# Patient Record
Sex: Female | Born: 1935 | Race: Black or African American | Hispanic: No | Smoking: Former smoker
Health system: Southern US, Community
[De-identification: ages and names within clinical notes are randomized; demographics above are authoritative.]

## PROBLEM LIST (undated history)

## (undated) DIAGNOSIS — N182 Chronic kidney disease, stage 2 (mild): Secondary | ICD-10-CM

## (undated) DIAGNOSIS — D696 Thrombocytopenia, unspecified: Secondary | ICD-10-CM

## (undated) DIAGNOSIS — E1129 Type 2 diabetes mellitus with other diabetic kidney complication: Secondary | ICD-10-CM

## (undated) DIAGNOSIS — H269 Unspecified cataract: Secondary | ICD-10-CM

## (undated) DIAGNOSIS — R21 Rash and other nonspecific skin eruption: Secondary | ICD-10-CM

## (undated) DIAGNOSIS — R351 Nocturia: Secondary | ICD-10-CM

## (undated) DIAGNOSIS — I739 Peripheral vascular disease, unspecified: Secondary | ICD-10-CM

## (undated) DIAGNOSIS — R413 Other amnesia: Secondary | ICD-10-CM

## (undated) DIAGNOSIS — R350 Frequency of micturition: Secondary | ICD-10-CM

## (undated) DIAGNOSIS — D649 Anemia, unspecified: Secondary | ICD-10-CM

## (undated) DIAGNOSIS — M25569 Pain in unspecified knee: Secondary | ICD-10-CM

## (undated) DIAGNOSIS — E785 Hyperlipidemia, unspecified: Secondary | ICD-10-CM

## (undated) DIAGNOSIS — E669 Obesity, unspecified: Secondary | ICD-10-CM

## (undated) DIAGNOSIS — I10 Essential (primary) hypertension: Secondary | ICD-10-CM

## (undated) DIAGNOSIS — G4700 Insomnia, unspecified: Secondary | ICD-10-CM

## (undated) DIAGNOSIS — L2089 Other atopic dermatitis: Secondary | ICD-10-CM

## (undated) DIAGNOSIS — Z0000 Encounter for general adult medical examination without abnormal findings: Secondary | ICD-10-CM

## (undated) DIAGNOSIS — F0390 Unspecified dementia without behavioral disturbance: Secondary | ICD-10-CM

## (undated) DIAGNOSIS — I509 Heart failure, unspecified: Secondary | ICD-10-CM

## (undated) DIAGNOSIS — E559 Vitamin D deficiency, unspecified: Secondary | ICD-10-CM

## (undated) DIAGNOSIS — R627 Adult failure to thrive: Secondary | ICD-10-CM

## (undated) DIAGNOSIS — F411 Generalized anxiety disorder: Secondary | ICD-10-CM

## (undated) HISTORY — DX: Anemia, unspecified: D64.9

## (undated) HISTORY — DX: Other amnesia: R41.3

## (undated) HISTORY — DX: Peripheral vascular disease, unspecified: I73.9

## (undated) HISTORY — DX: Essential (primary) hypertension: I10

## (undated) HISTORY — DX: Chronic kidney disease, stage 2 (mild): N18.2

## (undated) HISTORY — DX: Generalized anxiety disorder: F41.1

## (undated) HISTORY — DX: Frequency of micturition: R35.0

## (undated) HISTORY — DX: Hyperlipidemia, unspecified: E78.5

## (undated) HISTORY — DX: Nocturia: R35.1

## (undated) HISTORY — DX: Obesity, unspecified: E66.9

## (undated) HISTORY — DX: Rash and other nonspecific skin eruption: R21

## (undated) HISTORY — DX: Encounter for general adult medical examination without abnormal findings: Z00.00

## (undated) HISTORY — DX: Other atopic dermatitis: L20.89

## (undated) HISTORY — DX: Insomnia, unspecified: G47.00

## (undated) HISTORY — DX: Unspecified cataract: H26.9

## (undated) HISTORY — DX: Adult failure to thrive: R62.7

## (undated) HISTORY — DX: Vitamin D deficiency, unspecified: E55.9

## (undated) HISTORY — DX: Type 2 diabetes mellitus with other diabetic kidney complication: E11.29

## (undated) HISTORY — DX: Pain in unspecified knee: M25.569

---

## 1968-08-09 HISTORY — PX: ABDOMINAL HYSTERECTOMY: SHX81

## 1977-08-09 HISTORY — PX: BREAST SURGERY: SHX581

## 1999-09-24 LAB — HM COLONOSCOPY: HM Colonoscopy: NORMAL

## 2002-08-09 HISTORY — PX: EYE SURGERY: SHX253

## 2009-07-07 ENCOUNTER — Ambulatory Visit (HOSPITAL_COMMUNITY): Admission: RE | Admit: 2009-07-07 | Discharge: 2009-07-07 | Payer: Self-pay | Admitting: Internal Medicine

## 2009-07-11 ENCOUNTER — Ambulatory Visit (HOSPITAL_COMMUNITY): Admission: RE | Admit: 2009-07-11 | Discharge: 2009-07-11 | Payer: Self-pay | Admitting: Internal Medicine

## 2012-10-31 ENCOUNTER — Other Ambulatory Visit: Payer: Self-pay | Admitting: *Deleted

## 2012-10-31 DIAGNOSIS — I10 Essential (primary) hypertension: Secondary | ICD-10-CM

## 2012-10-31 DIAGNOSIS — E785 Hyperlipidemia, unspecified: Secondary | ICD-10-CM

## 2012-10-31 DIAGNOSIS — E1129 Type 2 diabetes mellitus with other diabetic kidney complication: Secondary | ICD-10-CM

## 2012-11-23 ENCOUNTER — Encounter: Payer: Self-pay | Admitting: *Deleted

## 2012-11-23 ENCOUNTER — Other Ambulatory Visit: Payer: Medicaid Other

## 2012-11-23 DIAGNOSIS — E785 Hyperlipidemia, unspecified: Secondary | ICD-10-CM

## 2012-11-23 DIAGNOSIS — I10 Essential (primary) hypertension: Secondary | ICD-10-CM

## 2012-11-23 DIAGNOSIS — E1129 Type 2 diabetes mellitus with other diabetic kidney complication: Secondary | ICD-10-CM

## 2012-11-24 ENCOUNTER — Encounter: Payer: Self-pay | Admitting: *Deleted

## 2012-11-24 LAB — MICROALBUMIN / CREATININE URINE RATIO
Creatinine, Ur: 245.4 mg/dL (ref 15.0–278.0)
MICROALB/CREAT RATIO: 4 mg/g creat (ref 0.0–30.0)
Microalbumin, Urine: 9.9 ug/mL (ref 0.0–17.0)

## 2012-11-24 LAB — COMPREHENSIVE METABOLIC PANEL
ALT: 5 IU/L (ref 0–32)
AST: 13 IU/L (ref 0–40)
Albumin/Globulin Ratio: 1.3 (ref 1.1–2.5)
Albumin: 4.1 g/dL (ref 3.5–4.7)
Alkaline Phosphatase: 76 IU/L (ref 39–117)
BUN/Creatinine Ratio: 13 (ref 11–26)
BUN: 35 mg/dL — ABNORMAL HIGH (ref 8–27)
CO2: 23 mmol/L (ref 19–28)
Calcium: 9.3 mg/dL (ref 8.6–10.2)
Chloride: 103 mmol/L (ref 97–108)
Creatinine, Ser: 2.68 mg/dL — ABNORMAL HIGH (ref 0.57–1.00)
GFR calc Af Amer: 19 mL/min/{1.73_m2} — ABNORMAL LOW (ref >59)
GFR calc non Af Amer: 16 mL/min/{1.73_m2} — ABNORMAL LOW (ref >59)
Globulin, Total: 3.1 g/dL (ref 1.5–4.5)
Glucose: 120 mg/dL — ABNORMAL HIGH (ref 65–99)
Potassium: 4.2 mmol/L (ref 3.5–5.2)
Sodium: 140 mmol/L (ref 134–144)
Total Bilirubin: 0.2 mg/dL (ref 0.0–1.2)
Total Protein: 7.2 g/dL (ref 6.0–8.5)

## 2012-11-24 LAB — LIPID PANEL
Chol/HDL Ratio: 3.5 ratio units (ref 0.0–4.4)
Cholesterol, Total: 179 mg/dL (ref 100–199)
HDL: 51 mg/dL (ref >39)
LDL Calculated: 111 mg/dL — ABNORMAL HIGH (ref 0–99)
Triglycerides: 87 mg/dL (ref 0–149)
VLDL Cholesterol Cal: 17 mg/dL (ref 5–40)

## 2012-11-24 LAB — HEMOGLOBIN A1C
Est. average glucose Bld gHb Est-mCnc: 140 mg/dL
Hgb A1c MFr Bld: 6.5 % — ABNORMAL HIGH (ref 4.8–5.6)

## 2012-11-27 ENCOUNTER — Encounter: Payer: Self-pay | Admitting: Pharmacotherapy

## 2012-11-27 ENCOUNTER — Ambulatory Visit (INDEPENDENT_AMBULATORY_CARE_PROVIDER_SITE_OTHER): Payer: Medicare Other | Admitting: Pharmacotherapy

## 2012-11-27 VITALS — BP 122/58 | HR 60 | Temp 96.5°F | Resp 18 | Wt 172.2 lb

## 2012-11-27 DIAGNOSIS — E1129 Type 2 diabetes mellitus with other diabetic kidney complication: Secondary | ICD-10-CM

## 2012-11-27 DIAGNOSIS — IMO0002 Reserved for concepts with insufficient information to code with codable children: Secondary | ICD-10-CM | POA: Insufficient documentation

## 2012-11-27 DIAGNOSIS — E785 Hyperlipidemia, unspecified: Secondary | ICD-10-CM

## 2012-11-27 DIAGNOSIS — I10 Essential (primary) hypertension: Secondary | ICD-10-CM

## 2012-11-27 MED ORDER — BAYER MICROLET LANCETS MISC: Status: DC

## 2012-11-27 MED ORDER — BAYER CONTOUR NEXT EZ W/DEVICE KIT: 1.0000 | PACK | Freq: Every day | Status: DC

## 2012-11-27 MED ORDER — GLUCOSE BLOOD VI STRP: ORAL_STRIP | Status: DC

## 2012-11-27 NOTE — Progress Notes (Signed)
  Subjective:    Natalie Wolfe is a 79 y.o. female who presents for follow-up of Type 2 diabetes mellitus.   A1C is 6.5% Average BG:  111mg /dl No hypoglycemia.  Lowest BG:  78mg /dl Has been making healthy food choices. She walks the steps for exercise every day. Denies problems with feet. Some watery eyes.  Eye exam is due and scheduled. Nocturia 2-3 times per night. Some polydipsia.  Review of Systems A comprehensive review of systems was negative except for: Eyes: positive for watery eyes Genitourinary: positive for nocturia Endocrine: positive for diabetic symptoms including polydipsia    Objective:    BP 122/58  Pulse 60  Temp(Src) 96.5 F (35.8 C)  Resp 18  Wt 172 lb 3.2 oz (78.109 kg)  General:  alert, cooperative, appears stated age and no distress  Oropharynx: normal findings: lips normal without lesions and gums healthy   Eyes:  negative findings: lids and lashes normal, conjunctivae and sclerae normal, corneas clear and pupils equal, round, reactive to light and accomodation   Ears:   external ears are normal        Lung: clear to auscultation bilaterally  Heart:  regular rate and rhythm     Extremities: extremities normal, atraumatic, no cyanosis or edema  Skin:  warm and dry     Neuro: mental status, speech normal, alert and oriented x3 and gait and station normal   Lab Review Glucose (mg/dL)  Date Value  8/46/9629 120*     CO2 (mmol/L)  Date Value  11/23/2012 23      BUN (mg/dL)  Date Value  01/04/4131 35*     Creatinine, Ser (mg/dL)  Date Value  4/40/1027 2.68*    Microalbumin:  9.9 AST:  13 ALT:  5 Total cholesterol:  179 LDL:  111 HDL:  51 TG:  87 Assessment:    Diabetes Mellitus type II, under excellent control.   BP goal <140/80 LDL goal <100   Plan:    1.  Rx changes: none 2.  DM well controlled on Actos and Amaryl.  Not a candidate for metformin due to SCr.  A1C is excellent at 6.5%. 3.  Reviewed nutrition  goals. 4.  Counseled on exercise goals - 30-45 minutes 5 x week. 5.  Reviewed foot care. 6.  BP well controlled.  Continue current medication. 7.  LDL close to goal of <100.  Will continue current RX and monitor.

## 2013-01-10 ENCOUNTER — Encounter: Payer: Self-pay | Admitting: Internal Medicine

## 2013-01-10 ENCOUNTER — Ambulatory Visit (INDEPENDENT_AMBULATORY_CARE_PROVIDER_SITE_OTHER): Payer: Medicare Other | Admitting: Internal Medicine

## 2013-01-10 VITALS — BP 142/74 | HR 65 | Temp 98.0°F | Resp 18 | Ht 63.0 in | Wt 172.0 lb

## 2013-01-10 DIAGNOSIS — I10 Essential (primary) hypertension: Secondary | ICD-10-CM

## 2013-01-10 DIAGNOSIS — E1122 Type 2 diabetes mellitus with diabetic chronic kidney disease: Secondary | ICD-10-CM

## 2013-01-10 DIAGNOSIS — E1129 Type 2 diabetes mellitus with other diabetic kidney complication: Secondary | ICD-10-CM

## 2013-01-10 DIAGNOSIS — N3281 Overactive bladder: Secondary | ICD-10-CM

## 2013-01-10 DIAGNOSIS — N182 Chronic kidney disease, stage 2 (mild): Secondary | ICD-10-CM

## 2013-01-10 DIAGNOSIS — D638 Anemia in other chronic diseases classified elsewhere: Secondary | ICD-10-CM

## 2013-01-10 DIAGNOSIS — N318 Other neuromuscular dysfunction of bladder: Secondary | ICD-10-CM

## 2013-01-10 MED ORDER — PIOGLITAZONE HCL 15 MG PO TABS: 15.0000 mg | ORAL_TABLET | Freq: Every day | ORAL | Status: DC

## 2013-01-10 NOTE — Patient Instructions (Signed)
Stop taking the prednisone

## 2013-01-10 NOTE — Progress Notes (Signed)
Subjective:    Patient ID: Natalie Wolfe, female    DOB: 28-Dec-1929, 79 y.o.   MRN: [id number]  HPI 79 y/o female patient is here for routine follow up She saw her kidney doctor today and had to rush to see [place]. bp under control at home cbg at home 78- 100 Her bladder symptoms have been under better control with vesicare. No side effects reported Reviewed her medications. compliant with her emdications Has no new concerns this visit  Of note- patient is on prednsione 20 mg daily for unclear reason in her medication list. Pt does not remember clearly if she is still taking this medication.   Review of Systems  Constitutional: Negative for fever, chills, activity change and appetite change.  HENT: Negative for congestion and mouth sores.        Dentures present  Respiratory: Negative for chest tightness and shortness of breath.   Cardiovascular: Negative for chest pain and leg swelling.  Gastrointestinal: Negative for abdominal pain and constipation.  Genitourinary: Positive for urgency and frequency. Negative for dysuria and flank pain.  Neurological: Negative for dizziness, syncope and light-headedness.  Hematological: Negative for adenopathy.  Psychiatric/Behavioral: Negative for behavioral problems and agitation.      Objective:   Physical Exam  Constitutional: She is oriented to person, place, and time. No distress.  Obese, elderly female  HENT:  Head: Normocephalic and atraumatic.  Mouth/Throat: Oropharynx is clear and moist. No oropharyngeal exudate.  Upper and lower dentures  Eyes: Pupils are equal, round, and reactive to light.  Neck: Normal range of motion. Neck supple. No tracheal deviation present. No thyromegaly present.  Cardiovascular: Normal rate, regular rhythm and normal heart sounds.   No murmur heard. Pulmonary/Chest: Effort normal and breath sounds normal. No respiratory distress.  Abdominal: Soft. Bowel sounds are normal. She exhibits no distension.  There is no tenderness.  Musculoskeletal: Normal range of motion. She exhibits no edema.  Lymphadenopathy:    She has no cervical adenopathy.  Neurological: She is alert and oriented to person, place, and time.  Skin: Skin is warm and dry. No rash noted. She is not diaphoretic.  Psychiatric: She has a normal mood and affect. Her behavior is normal.    BP 142/74  Pulse 65  Temp(Src) 98 F (36.7 C) (Oral)  Resp 18  Ht 5\' 3"  (1.6 m)  Wt 172 lb (78.019 kg)  BMI 30.48 kg/m2  SpO2 99%      Assessment & Plan:   HTN- SBP mildly elevated. No symptoms present. Continue amlodipine-benazepril, hctz with coreg  DM TYPE 2 with renal complications- home cbg reviewed. Continue glimepiride with actos. On ACEI and statin. Has microalbuminuria  Anemia of chronic disease- has hisotry of iron deficiency anemia and chronic kidney disease would be contributing to the anemia as well. Currently h/h stable. Monitor cbc periodically  CKD- in setting of htn and DM, follows with renal. To monitor bp and sugar control. Diet counselling provided  Overactive bladder- improved with her taking vesicare, no changes to current regimen  On review of prior notes, no documentation on patient being started on prednisone. On review of her med list, pt has script for prednisone with multiple refills. She is not sure of taking prednisone at present. Her dermatology note shows pt prescribed steroid course for her skin problem for few days. Called her pharmacy which mentions patient being provided with steroid script once and has not taken her refills. With unclear reason for being on steroid and length of  the course and pt currently not sure of taking it, will discontinue it for now.

## 2013-01-14 DIAGNOSIS — M1710 Unilateral primary osteoarthritis, unspecified knee: Secondary | ICD-10-CM | POA: Insufficient documentation

## 2013-01-14 DIAGNOSIS — E559 Vitamin D deficiency, unspecified: Secondary | ICD-10-CM | POA: Insufficient documentation

## 2013-01-14 DIAGNOSIS — D638 Anemia in other chronic diseases classified elsewhere: Secondary | ICD-10-CM | POA: Insufficient documentation

## 2013-01-14 DIAGNOSIS — N3281 Overactive bladder: Secondary | ICD-10-CM | POA: Insufficient documentation

## 2013-02-01 ENCOUNTER — Encounter: Payer: Self-pay | Admitting: Nephrology

## 2013-05-01 ENCOUNTER — Ambulatory Visit (INDEPENDENT_AMBULATORY_CARE_PROVIDER_SITE_OTHER): Payer: Medicare Other | Admitting: Internal Medicine

## 2013-05-01 ENCOUNTER — Encounter: Payer: Self-pay | Admitting: Internal Medicine

## 2013-05-01 VITALS — BP 120/60 | HR 70 | Temp 99.0°F | Resp 18 | Ht 63.0 in | Wt 170.8 lb

## 2013-05-01 DIAGNOSIS — N3281 Overactive bladder: Secondary | ICD-10-CM

## 2013-05-01 DIAGNOSIS — N318 Other neuromuscular dysfunction of bladder: Secondary | ICD-10-CM

## 2013-05-01 DIAGNOSIS — E785 Hyperlipidemia, unspecified: Secondary | ICD-10-CM

## 2013-05-01 DIAGNOSIS — I10 Essential (primary) hypertension: Secondary | ICD-10-CM

## 2013-05-01 DIAGNOSIS — Z23 Encounter for immunization: Secondary | ICD-10-CM

## 2013-05-01 DIAGNOSIS — E1129 Type 2 diabetes mellitus with other diabetic kidney complication: Secondary | ICD-10-CM

## 2013-05-01 DIAGNOSIS — N184 Chronic kidney disease, stage 4 (severe): Secondary | ICD-10-CM

## 2013-05-01 NOTE — Addendum Note (Signed)
Addended by: Lamont Snowball on: 05/01/2013 12:14 PM   Modules accepted: Orders

## 2013-05-01 NOTE — Progress Notes (Signed)
Patient ID: Natalie Wolfe, female   DOB: 19-Aug-1929, 79 y.o.   MRN: [id number]  Chief Complaint  Patient presents with  . Follow-up    HPI 79 y/o female patient is here for routine follow up. She recent had dental workup. She has been compliant with her medications. She did not check her sugar this am. Her sugar yesterday was 78 in the morning. reviewed last a1c 6.5 Blood pressure appears controlled this visit cbg at home 70- 110 with one reading of 140 Her bladder symptoms well controlled with vesicare.  Reviewed her medications. compliant with her emdications Has no new concerns this visit  Review of Systems  Constitutional: Negative for fever, chills, activity change and appetite change.  HENT: Negative for congestion and mouth sores.         Dentures present  Respiratory: Negative for chest tightness and shortness of breath.   Cardiovascular: Negative for chest pain and leg swelling.  Gastrointestinal: Negative for abdominal pain and constipation.  Genitourinary: Negative for dysuria, urgency, frequency and flank pain.  Neurological: Negative for dizziness, syncope and light-headedness.  Hematological: Negative for adenopathy.  Psychiatric/Behavioral: Negative for behavioral problems and agitation.   Past Medical History  Diagnosis Date  . Urinary frequency   . Routine general medical examination at a health care facility   . Other atopic dermatitis and related conditions   . Rash and other nonspecific skin eruption   . Other anxiety states   . Insomnia, unspecified   . Chronic kidney disease, stage II (mild)   . Type II or unspecified type diabetes mellitus with renal manifestations, not stated as uncontrolled(250.40)   . PVD (peripheral vascular disease)   . Unspecified vitamin D deficiency   . Anemia, unspecified   . Obesity, unspecified   . Nocturia   . Pain in joint, lower leg   . Other and unspecified hyperlipidemia   . Unspecified essential hypertension     Current Outpatient Prescriptions on File Prior to Visit  Medication Sig Dispense Refill  . amLODipine-benazepril (LOTREL) 10-20 MG per capsule Take 1 capsule by mouth daily. Take one tablet once a day for blood pressure      . BAYER MICROLET LANCETS lancets Use as instructed  100 each  12  . Blood Glucose Monitoring Suppl (CONTOUR NEXT EZ MONITOR) W/DEVICE KIT 1 Device by Does not apply route daily.  1 kit  12  . carvedilol (COREG) 12.5 MG tablet Take 1 tablet by mouth daily. Take 1 tablet daily      . cholecalciferol (VITAMIN D) 1000 UNITS tablet Take 1,000 Units by mouth daily. Take 2 capsules once a day for vitamin d supplement      . fluocinonide cream (LIDEX) 0.05 % Apply topically 2 (two) times daily. Apply from neck down twice a day (provided by dermatology).      Marland Kitchen glucose blood (BAYER CONTOUR NEXT TEST) test strip Use as instructed  100 each  12  . hydrochlorothiazide (MICROZIDE) 12.5 MG capsule Take 12.5 mg by mouth daily. Take one tablet once a day for blood pressure      . hydrocortisone 2.5 % lotion Use to affected area as directed      . pioglitazone (ACTOS) 15 MG tablet Take 1 tablet (15 mg total) by mouth daily. Take one tablet once a day to control diabetes  30 tablet  4  . rosuvastatin (CRESTOR) 20 MG tablet Take 20 mg by mouth daily. Take one tablet once a day  for cholesterol      .  solifenacin (VESICARE) 5 MG tablet Take 10 mg by mouth daily. Take one tablet once a day for bladder control       No current facility-administered medications on file prior to visit.    Physical exam-  BP 120/60  Pulse 70  Temp(Src) 99 F (37.2 C) (Oral)  Resp 18  Ht 5\' 3"  (1.6 m)  Wt 170 lb 12.8 oz (77.474 kg)  BMI 30.26 kg/m2  SpO2 97%  Constitutional: She is oriented to person, place, and time. No distress.  Obese, elderly female  HENT:   Head: Normocephalic and atraumatic.   Mouth/Throat: Oropharynx is clear and moist. No oropharyngeal exudate.  Upper and lower dentures   Eyes: Pupils are equal, round, and reactive to light.  Neck: Normal range of motion. Neck supple. No tracheal deviation present. No thyromegaly present.  Cardiovascular: Normal rate, regular rhythm and normal heart sounds.    No murmur heard. Pulmonary/Chest: Effort normal and breath sounds normal. No respiratory distress.  Abdominal: Soft. Bowel sounds are normal. She exhibits no distension. There is no tenderness.  Musculoskeletal: Normal range of motion. She exhibits no edema.  Lymphadenopathy:    She has no cervical adenopathy.  Neurological: She is alert and oriented to person, place, and time.  Skin: Skin is warm and dry. No rash noted. She is not diaphoretic.  Psychiatric: She has a normal mood and affect. Her behavior is normal.    Assessment/plan  HTN- blood pressure well controlled.  Continue amlodipine-benazepril, hctz with coreg. Check bmp today  DM TYPE 2 with renal complications- home cbg reviewed. With a1c of 6.5 and her age and cbg between 70-140, will stop her amaryl for now. It increase risk of hypoglycemia in her. Continue actos for now until reviewing another a1c. Check a1c today. On ACEI and statin. Check urine microalbumin. Normal foot exam. Will provide influenza and pneumococcal vaccine today  Overactive bladder- improved with her taking vesicare, no changes to current regimen  CKD- in setting of htn and DM, follows with renal. To monitor bp and sugar control. Diet counselling provided   Labs- cmp, a1c, urine microalbumin

## 2013-05-02 LAB — COMPREHENSIVE METABOLIC PANEL
ALT: 5 IU/L (ref 0–32)
AST: 14 IU/L (ref 0–40)
Albumin/Globulin Ratio: 1.5 (ref 1.1–2.5)
Albumin: 4.3 g/dL (ref 3.5–4.7)
Alkaline Phosphatase: 89 IU/L (ref 39–117)
BUN/Creatinine Ratio: 14 (ref 11–26)
BUN: 37 mg/dL — ABNORMAL HIGH (ref 8–27)
CO2: 23 mmol/L (ref 18–29)
Calcium: 9.4 mg/dL (ref 8.6–10.2)
Chloride: 103 mmol/L (ref 97–108)
Creatinine, Ser: 2.65 mg/dL — ABNORMAL HIGH (ref 0.57–1.00)
GFR calc Af Amer: 19 mL/min/{1.73_m2} — ABNORMAL LOW (ref >59)
GFR calc non Af Amer: 16 mL/min/{1.73_m2} — ABNORMAL LOW (ref >59)
Globulin, Total: 2.8 g/dL (ref 1.5–4.5)
Glucose: 123 mg/dL — ABNORMAL HIGH (ref 65–99)
Potassium: 4.4 mmol/L (ref 3.5–5.2)
Sodium: 141 mmol/L (ref 134–144)
Total Bilirubin: 0.2 mg/dL (ref 0.0–1.2)
Total Protein: 7.1 g/dL (ref 6.0–8.5)

## 2013-05-02 LAB — LIPID PANEL
Chol/HDL Ratio: 3.4 ratio units (ref 0.0–4.4)
Cholesterol, Total: 173 mg/dL (ref 100–199)
HDL: 51 mg/dL (ref >39)
LDL Calculated: 97 mg/dL (ref 0–99)
Triglycerides: 123 mg/dL (ref 0–149)
VLDL Cholesterol Cal: 25 mg/dL (ref 5–40)

## 2013-05-02 LAB — MICROALBUMIN / CREATININE URINE RATIO
Creatinine, Ur: 339.7 mg/dL — ABNORMAL HIGH (ref 15.0–278.0)
MICROALB/CREAT RATIO: 5.8 mg/g creat (ref 0.0–30.0)
Microalbumin, Urine: 19.6 ug/mL — ABNORMAL HIGH (ref 0.0–17.0)

## 2013-05-02 LAB — HEMOGLOBIN A1C
Est. average glucose Bld gHb Est-mCnc: 148 mg/dL
Hgb A1c MFr Bld: 6.8 % — ABNORMAL HIGH (ref 4.8–5.6)

## 2013-05-04 ENCOUNTER — Other Ambulatory Visit (HOSPITAL_COMMUNITY): Payer: Self-pay | Admitting: *Deleted

## 2013-05-08 ENCOUNTER — Encounter (HOSPITAL_COMMUNITY): Payer: Self-pay

## 2013-05-16 ENCOUNTER — Encounter (HOSPITAL_COMMUNITY): Payer: Self-pay

## 2013-05-28 ENCOUNTER — Ambulatory Visit: Payer: Medicare Other | Admitting: Pharmacotherapy

## 2013-05-30 ENCOUNTER — Encounter (HOSPITAL_COMMUNITY)
Admission: RE | Admit: 2013-05-30 | Discharge: 2013-05-30 | Disposition: A | Discharge status: Home / Self Care | Payer: Medicare Other | Source: Ambulatory Visit | Attending: Nephrology | Admitting: Nephrology

## 2013-05-30 DIAGNOSIS — D509 Iron deficiency anemia, unspecified: Secondary | ICD-10-CM | POA: Insufficient documentation

## 2013-05-30 MED ORDER — SODIUM CHLORIDE 0.9 % IV SOLN
1020.0000 mg | Freq: Once | INTRAVENOUS | Status: AC
  Administered 2013-05-30: 1020 mg via INTRAVENOUS
  Filled 2013-05-30: qty 34

## 2013-06-06 ENCOUNTER — Other Ambulatory Visit: Payer: Self-pay | Admitting: *Deleted

## 2013-06-06 DIAGNOSIS — I10 Essential (primary) hypertension: Secondary | ICD-10-CM

## 2013-06-06 MED ORDER — BENAZEPRIL HCL 20 MG PO TABS: 20.0000 mg | ORAL_TABLET | Freq: Every day | ORAL | Status: DC

## 2013-06-06 MED ORDER — AMLODIPINE BESYLATE 10 MG PO TABS: 10.0000 mg | ORAL_TABLET | Freq: Every day | ORAL | Status: DC

## 2013-07-23 ENCOUNTER — Encounter: Payer: Self-pay | Admitting: *Deleted

## 2013-07-24 ENCOUNTER — Ambulatory Visit (INDEPENDENT_AMBULATORY_CARE_PROVIDER_SITE_OTHER): Payer: Medicare Other | Admitting: Internal Medicine

## 2013-07-24 ENCOUNTER — Encounter: Payer: Self-pay | Admitting: Internal Medicine

## 2013-07-24 VITALS — BP 124/60 | HR 64 | Temp 98.2°F | Resp 14 | Wt 166.6 lb

## 2013-07-24 DIAGNOSIS — N184 Chronic kidney disease, stage 4 (severe): Secondary | ICD-10-CM

## 2013-07-24 DIAGNOSIS — N3281 Overactive bladder: Secondary | ICD-10-CM

## 2013-07-24 DIAGNOSIS — N318 Other neuromuscular dysfunction of bladder: Secondary | ICD-10-CM

## 2013-07-24 DIAGNOSIS — M1710 Unilateral primary osteoarthritis, unspecified knee: Secondary | ICD-10-CM

## 2013-07-24 DIAGNOSIS — I10 Essential (primary) hypertension: Secondary | ICD-10-CM

## 2013-07-24 DIAGNOSIS — E1129 Type 2 diabetes mellitus with other diabetic kidney complication: Secondary | ICD-10-CM

## 2013-07-24 NOTE — Progress Notes (Signed)
Patient ID: Natalie Wolfe, female   DOB: 1930/02/20, 79 y.o.   MRN: [id number]  Chief Complaint  Patient presents with  . Medical Managment of Chronic Issues    3 month f/u  . Knee Pain    No Known Allergies  HPI 79 y/o female patient is here for routine follow up. She has completed dental workup and has all teeth extracted and now has upper and lower dentures. She has been compliant with her medications. She is also taking her glimeperide.She did not check her sugar this am. Her sugar yesterday was 125 in the morning. cbg ranging between 89-133. reviewed last a1c 6.8 Blood pressure appears controlled this visit Her bladder symptoms well controlled with vesicare.  She complaints of pain in her right knee on and off - it has been there since 2010 but these days when she walks long distance, she notices aching and swelling in her right knee. Denies any redness or drainage. Resting and elevating legs tend to help. No swelling this morning and denies any pain  Saw her eye doc recently Has no other concerns this visit  Review of Systems   Constitutional: Negative for fever, chills, activity change and appetite change.   HENT: Negative for congestion and mouth sores.         Dentures present   Respiratory: Negative for chest tightness and shortness of breath.    Cardiovascular: Negative for chest pain and leg swelling.   Gastrointestinal: Negative for abdominal pain and constipation.   Genitourinary: Negative for dysuria, urgency, frequency and flank pain.   Neurological: Negative for dizziness, syncope and light-headedness.   Hematological: Negative for adenopathy.   Psychiatric/Behavioral: Negative for behavioral problems and agitation.   Past Medical History  Diagnosis Date  . Urinary frequency   . Routine general medical examination at a health care facility   . Other atopic dermatitis and related conditions   . Rash and other nonspecific skin eruption   . Other anxiety states    . Insomnia, unspecified   . Chronic kidney disease, stage II (mild)   . Type II or unspecified type diabetes mellitus with renal manifestations, not stated as uncontrolled(250.40)   . PVD (peripheral vascular disease)   . Unspecified vitamin D deficiency   . Anemia, unspecified   . Obesity, unspecified   . Nocturia   . Pain in joint, lower leg   . Other and unspecified hyperlipidemia   . Unspecified essential hypertension   . Cataract    Medication reviewed. See [facility]  Physical exam BP 124/60  Pulse 64  Temp(Src) 98.2 F (36.8 C) (Oral)  Resp 14  Wt 166 lb 9.6 oz (75.569 kg)  SpO2 98%  Constitutional: She is oriented to person, place, and time. No distress.  elderly female   HENT:   Head: Normocephalic and atraumatic.   Mouth/Throat: Oropharynx is clear and moist. No oropharyngeal exudate.  Upper and lower dentures  Eyes: Pupils are equal, round, and reactive to light.   Neck: Normal range of motion. Neck supple. No tracheal deviation present. No thyromegaly present.   Cardiovascular: Normal rate, regular rhythm and normal heart sounds.    No murmur heard. Pulmonary/Chest: Effort normal and breath sounds normal. No respiratory distress.   Abdominal: Soft. Bowel sounds are normal. She exhibits no distension. There is no tenderness.  Musculoskeletal: Normal range of motion. She exhibits no edema.  Lymphadenopathy:    She has no cervical adenopathy.  Neurological: She is alert and oriented to person, place,  and time.   Skin: Skin is warm and dry. No rash noted. She is not diaphoretic.  Psychiatric: She has a normal mood and affect. Her behavior is normal.   Labs/reviewed   CMP     Component Value Date/Time   NA 144 07/24/2013 1002   K 4.1 07/24/2013 1002   CL 104 07/24/2013 1002   CO2 23 07/24/2013 1002   GLUCOSE 104* 07/24/2013 1002   BUN 30* 07/24/2013 1002   CREATININE 2.44* 07/24/2013 1002   CALCIUM 9.3 07/24/2013 1002   PROT 7.1 05/01/2013 1149   AST 14  05/01/2013 1149   ALT 5 05/01/2013 1149   ALKPHOS 89 05/01/2013 1149   BILITOT 0.2 05/01/2013 1149   GFRNONAA 18* 07/24/2013 1002   GFRAA 21* 07/24/2013 1002    Lab Results  Component Value Date   HGBA1C 6.2* 07/24/2013    Assessment/plan  1. Type II or unspecified type diabetes mellitus with renal manifestations, not stated as uncontrolled(250.40) Will recheck her a1c and if <6.5, will stop actos given normal sugar readings and her elderly age. Continue statin - Hemoglobin A1c - Basic Metabolic Panel  2. Overactive bladder Continue vesicare  3. Arthritis of knee Tylenol prn, rest and heat pack prn for now. Continue vitamin d  4. Unspecified essential hypertension Continue amlodipine-benazepril, hctz with coreg. Check bmp today  5. CKD (chronic kidney disease) stage 4, GFR 15-29 ml/min in setting of htn and DM, follows with renal. To monitor bp and sugar control. Diet counselling provided

## 2013-07-25 LAB — BASIC METABOLIC PANEL
BUN: 30 mg/dL — ABNORMAL HIGH (ref 8–27)
CO2: 23 mmol/L (ref 18–29)
Chloride: 104 mmol/L (ref 97–108)
Glucose: 104 mg/dL — ABNORMAL HIGH (ref 65–99)

## 2013-07-25 LAB — HEMOGLOBIN A1C: Hgb A1c MFr Bld: 6.2 % — ABNORMAL HIGH (ref 4.8–5.6)

## 2013-08-20 ENCOUNTER — Encounter: Payer: Self-pay | Admitting: Internal Medicine

## 2013-09-09 HISTORY — PX: MULTIPLE TOOTH EXTRACTIONS: SHX2053

## 2013-11-27 ENCOUNTER — Ambulatory Visit (INDEPENDENT_AMBULATORY_CARE_PROVIDER_SITE_OTHER): Payer: Medicare Other | Admitting: Internal Medicine

## 2013-11-27 ENCOUNTER — Encounter: Payer: Self-pay | Admitting: Internal Medicine

## 2013-11-27 VITALS — BP 108/60 | HR 51 | Temp 98.9°F | Wt 160.0 lb

## 2013-11-27 DIAGNOSIS — M1710 Unilateral primary osteoarthritis, unspecified knee: Secondary | ICD-10-CM

## 2013-11-27 DIAGNOSIS — N318 Other neuromuscular dysfunction of bladder: Secondary | ICD-10-CM

## 2013-11-27 DIAGNOSIS — N3281 Overactive bladder: Secondary | ICD-10-CM

## 2013-11-27 DIAGNOSIS — E1129 Type 2 diabetes mellitus with other diabetic kidney complication: Secondary | ICD-10-CM

## 2013-11-27 DIAGNOSIS — E785 Hyperlipidemia, unspecified: Secondary | ICD-10-CM

## 2013-11-27 DIAGNOSIS — IMO0002 Reserved for concepts with insufficient information to code with codable children: Secondary | ICD-10-CM

## 2013-11-27 DIAGNOSIS — N184 Chronic kidney disease, stage 4 (severe): Secondary | ICD-10-CM

## 2013-11-27 DIAGNOSIS — E559 Vitamin D deficiency, unspecified: Secondary | ICD-10-CM

## 2013-11-27 DIAGNOSIS — I10 Essential (primary) hypertension: Secondary | ICD-10-CM

## 2013-11-27 NOTE — Patient Instructions (Addendum)
Stop taking COREG for now. Check blood pressure and heart rate once a day at home and bring reading to the office in 1 month

## 2013-11-27 NOTE — Progress Notes (Signed)
Patient ID: Natalie Wolfe, female   DOB: 1930-03-27, 80 y.o.   MRN: [id number]    Chief Complaint  Patient presents with  . Medical Management of Chronic Issues    4 month follow-up   No Known Allergies  HPI 80 y/o female patient is here for routine follow up. She has upper and lower dentures. She has been compliant with her medications. cbg averaging at 107. ranging between 90-142. reviewed last a1c 6.2 Blood pressure appears on lower side of normal with slow heart rate. Her bladder symptoms well controlled with vesicare.   Knee pain has improved. No falls reported  Review of Systems   Constitutional: Negative for fever, chills, activity change. Appetite is good but unable to eat because of her dentures and has lost weight HENT: Negative for congestion and mouth sores.         Dentures present  : both upper and lower Respiratory: Negative for chest tightness and shortness of breath. denies cough   Cardiovascular: Negative for chest pain, palpitations, claudication, orthopnea and leg swelling.   Gastrointestinal: Negative for abdominal pain, nausea, vomiting, dirrhea and constipation.   Genitourinary: Negative for dysuria, urgency, frequency and flank pain.   Neurological: Negative for dizziness, focal weakness, syncope. Hs been having occassional light-headedness.   Hematological: Negative for adenopathy.   Psychiatric/Behavioral: Negative for behavioral problems and agitation. Normal sleep pattern, denies depression    Past Medical History  Diagnosis Date  . Urinary frequency   . Routine general medical examination at a health care facility   . Other atopic dermatitis and related conditions   . Rash and other nonspecific skin eruption   . Other anxiety states   . Insomnia, unspecified   . Chronic kidney disease, stage II (mild)   . Type II or unspecified type diabetes mellitus with renal manifestations, not stated as uncontrolled   . PVD (peripheral vascular disease)   .  Unspecified vitamin D deficiency   . Anemia, unspecified   . Obesity, unspecified   . Nocturia   . Pain in joint, lower leg   . Other and unspecified hyperlipidemia   . Unspecified essential hypertension   . Cataract    Past Surgical History  Procedure Laterality Date  . Abdominal hysterectomy  1970  . Breast surgery  1979    nodule removed  . Eye surgery  2004    catarcts removed from right eye.  . Multiple tooth extractions  09/2013    Remonve remaining 3 teeth, no with dentures    Current Outpatient Prescriptions on File Prior to Visit  Medication Sig Dispense Refill  . amLODipine-benazepril (LOTREL) 10-20 MG per capsule Take 1 capsule by mouth daily. Take one tablet once a day for blood pressure      . BAYER MICROLET LANCETS lancets Use as instructed  100 each  12  . Blood Glucose Monitoring Suppl (CONTOUR NEXT EZ MONITOR) W/DEVICE KIT 1 Device by Does not apply route daily.  1 kit  12  . cholecalciferol (VITAMIN D) 1000 UNITS tablet Take 1,000 Units by mouth daily. Take 2 capsules once a day for vitamin d supplement      . fluocinonide cream (LIDEX) 0.05 % Apply topically 2 (two) times daily. Apply from neck down twice a day (provided by dermatology).      Marland Kitchen glucose blood (BAYER CONTOUR NEXT TEST) test strip Use as instructed  100 each  12  . hydrochlorothiazide (MICROZIDE) 12.5 MG capsule Take 12.5 mg by mouth daily. Take one  tablet once a day for blood pressure      . hydrocortisone 2.5 % lotion Use to affected area as directed      . pioglitazone (ACTOS) 15 MG tablet Take 1 tablet (15 mg total) by mouth daily. Take one tablet once a day to control diabetes  30 tablet  4  . rosuvastatin (CRESTOR) 20 MG tablet Take 20 mg by mouth daily. Take one tablet once a day  for cholesterol      . solifenacin (VESICARE) 5 MG tablet Take 10 mg by mouth daily. Take one tablet once a day for bladder control       No current facility-administered medications on file prior to visit.     Physical exam BP 108/60  Pulse 51  Temp(Src) 98.9 F (37.2 C) (Oral)  Wt 160 lb (72.576 kg)  SpO2 96%  Constitutional: She is oriented to person, place, and time. No distress.  elderly female   HENT:   Head: Normocephalic and atraumatic.   Mouth/Throat: Oropharynx is clear and moist. No oropharyngeal exudate.  Upper and lower dentures  Eyes: Pupils are equal, round, and reactive to light.   Neck: Normal range of motion. Neck supple. No tracheal deviation present. No thyromegaly present.   Cardiovascular: Normal rate, regular rhythm and normal heart sounds.    No murmur heard. Pulmonary/Chest: Effort normal and breath sounds normal. No respiratory distress.   Abdominal: Soft. Bowel sounds are normal. She exhibits no distension. There is no tenderness.  Musculoskeletal: Normal range of motion. She exhibits no edema.  Lymphadenopathy:    She has no cervical adenopathy.  Neurological: She is alert and oriented to person, place, and time.   Skin: Skin is warm and dry. No rash noted. She is not diaphoretic.  Psychiatric: She has a normal mood and affect. Her behavior is normal.    Labs- Lab Results  Component Value Date   HGBA1C 6.2* 07/24/2013   Lipid Panel     Component Value Date/Time   TRIG 123 05/01/2013 1149   HDL 51 05/01/2013 1149   CHOLHDL 3.4 05/01/2013 1149   LDLCALC 97 05/01/2013 1149   CMP     Component Value Date/Time   NA 144 07/24/2013 1002   K 4.1 07/24/2013 1002   CL 104 07/24/2013 1002   CO2 23 07/24/2013 1002   GLUCOSE 104* 07/24/2013 1002   BUN 30* 07/24/2013 1002   CREATININE 2.44* 07/24/2013 1002   CALCIUM 9.3 07/24/2013 1002   PROT 7.1 05/01/2013 1149   AST 14 05/01/2013 1149   ALT 5 05/01/2013 1149   ALKPHOS 89 05/01/2013 1149   BILITOT 0.2 05/01/2013 1149   GFRNONAA 18* 07/24/2013 1002   GFRAA 21* 07/24/2013 1002    Assessment/plan  1. Type II or unspecified type diabetes mellitus with renal manifestations, not stated as  uncontrolled(250.40) a1c in 12/14 6.2. Recheck a1c. Reviewed cbg. Continue actos for now - CMP - Lipid Panel - CBC with Differential - Hemoglobin A1c  2. Unspecified essential hypertension Stable, infact on lower side of normal. Will stop coreg with low bp and low HR. Check bp at home and review in 1 month. If still low, will consider stopping hctz. Continue lotrel for now  3. Arthritis of knee Stable on prn tylenol  4. Overactive bladder  continue vesicare 5 mg daily for now  5. CKD (chronic kidney disease) stage 4, GFR 15-29 ml/min Recheck bmp, avoid NSAIDs  6. Unspecified vitamin D deficiency Continue vit d supplement - Vitamin  D, 1,25-dihydroxy  7. Other and unspecified hyperlipidemia Continue crestor for now - Lipid Panel  A1c, lipid panel, cmp, cbc

## 2013-11-28 LAB — CBC WITH DIFFERENTIAL/PLATELET
BASOS ABS: 0 10*3/uL (ref 0.0–0.2)
Basos: 1 %
EOS ABS: 0.3 10*3/uL (ref 0.0–0.4)
Eos: 6 %
HCT: 30 % — ABNORMAL LOW (ref 34.0–46.6)
HEMOGLOBIN: 9.7 g/dL — AB (ref 11.1–15.9)
IMMATURE GRANULOCYTES: 0 %
Immature Grans (Abs): 0 10*3/uL (ref 0.0–0.1)
LYMPHS: 31 %
Lymphocytes Absolute: 1.5 10*3/uL (ref 0.7–3.1)
MCH: 27.2 pg (ref 26.6–33.0)
MCHC: 32.3 g/dL (ref 31.5–35.7)
MCV: 84 fL (ref 79–97)
Monocytes Absolute: 0.4 10*3/uL (ref 0.1–0.9)
Monocytes: 9 %
NEUTROS ABS: 2.6 10*3/uL (ref 1.4–7.0)
Neutrophils Relative %: 53 %
RBC: 3.57 x10E6/uL — ABNORMAL LOW (ref 3.77–5.28)
RDW: 13.7 % (ref 12.3–15.4)
WBC: 4.8 10*3/uL (ref 3.4–10.8)

## 2013-11-28 LAB — LIPID PANEL
CHOL/HDL RATIO: 3.7 ratio (ref 0.0–4.4)
CHOLESTEROL TOTAL: 160 mg/dL (ref 100–199)
HDL: 43 mg/dL (ref >39)
LDL Calculated: 93 mg/dL (ref 0–99)
TRIGLYCERIDES: 120 mg/dL (ref 0–149)
VLDL Cholesterol Cal: 24 mg/dL (ref 5–40)

## 2013-11-28 LAB — COMPREHENSIVE METABOLIC PANEL
ALBUMIN: 4.2 g/dL (ref 3.5–4.7)
ALK PHOS: 56 IU/L (ref 39–117)
ALT: 9 IU/L (ref 0–32)
AST: 20 IU/L (ref 0–40)
Albumin/Globulin Ratio: 1.7 (ref 1.1–2.5)
BUN / CREAT RATIO: 14 (ref 11–26)
BUN: 48 mg/dL — AB (ref 8–27)
CALCIUM: 9.6 mg/dL (ref 8.7–10.3)
CHLORIDE: 102 mmol/L (ref 97–108)
CO2: 24 mmol/L (ref 18–29)
Creatinine, Ser: 3.32 mg/dL — ABNORMAL HIGH (ref 0.57–1.00)
GFR calc Af Amer: 14 mL/min/{1.73_m2} — ABNORMAL LOW (ref >59)
GFR calc non Af Amer: 12 mL/min/{1.73_m2} — ABNORMAL LOW (ref >59)
GLOBULIN, TOTAL: 2.5 g/dL (ref 1.5–4.5)
Glucose: 102 mg/dL — ABNORMAL HIGH (ref 65–99)
Potassium: 4.2 mmol/L (ref 3.5–5.2)
Sodium: 142 mmol/L (ref 134–144)
Total Bilirubin: 0.3 mg/dL (ref 0.0–1.2)
Total Protein: 6.7 g/dL (ref 6.0–8.5)

## 2013-11-28 LAB — HEMOGLOBIN A1C
Est. average glucose Bld gHb Est-mCnc: 143 mg/dL
Hgb A1c MFr Bld: 6.6 % — ABNORMAL HIGH (ref 4.8–5.6)

## 2013-11-28 LAB — VITAMIN D 1,25 DIHYDROXY: Vit D, 1,25-Dihydroxy: 40.7 pg/mL (ref 19.9–79.3)

## 2013-11-29 ENCOUNTER — Telehealth: Payer: Self-pay | Admitting: *Deleted

## 2013-11-29 MED ORDER — AMLODIPINE BESYLATE 5 MG PO TABS: ORAL_TABLET | ORAL | Status: DC

## 2013-11-29 NOTE — Telephone Encounter (Signed)
Pt.notified

## 2013-12-13 ENCOUNTER — Other Ambulatory Visit: Payer: Self-pay | Admitting: *Deleted

## 2013-12-13 DIAGNOSIS — I10 Essential (primary) hypertension: Secondary | ICD-10-CM

## 2013-12-17 ENCOUNTER — Other Ambulatory Visit: Payer: Medicare Other

## 2013-12-17 ENCOUNTER — Other Ambulatory Visit: Payer: Self-pay | Admitting: Internal Medicine

## 2013-12-18 ENCOUNTER — Ambulatory Visit (INDEPENDENT_AMBULATORY_CARE_PROVIDER_SITE_OTHER): Payer: Medicare Other | Admitting: Internal Medicine

## 2013-12-18 ENCOUNTER — Encounter: Payer: Self-pay | Admitting: Internal Medicine

## 2013-12-18 VITALS — BP 138/60 | HR 70 | Resp 10 | Wt 161.0 lb

## 2013-12-18 DIAGNOSIS — N184 Chronic kidney disease, stage 4 (severe): Secondary | ICD-10-CM

## 2013-12-18 DIAGNOSIS — I10 Essential (primary) hypertension: Secondary | ICD-10-CM

## 2013-12-18 NOTE — Progress Notes (Signed)
Patient ID: Natalie Wolfe, female   DOB: 26-Jan-1930, 80 y.o.   MRN: [id number]    Chief Complaint  Patient presents with  . Hypertension     f/u on B/P and renal function   No Known Allergies  HPI 80 y/o female patient is here for follow up on BP readings. On review of her labs few weeks back, her renal function had worsened. Her HCTZ was stopped then and amlodipine increased to 10 mg daily. Patient has been taking only 5 mg daily at home for now as she mentions no new script was sent. On further verification with CMA Angelique BlonderDenise, she has documented sending a script but no new script was sent to the pharmacy. Her blood pressure has been well controlled at home. All bp readings < 140/90 as per patient  ROS No chest pain or dyspnea No further toothache No headache No abdominal pain  Past Medical History  Diagnosis Date  . Urinary frequency   . Routine general medical examination at a health care facility   . Other atopic dermatitis and related conditions   . Rash and other nonspecific skin eruption   . Other anxiety states   . Insomnia, unspecified   . Chronic kidney disease, stage II (mild)   . Type II or unspecified type diabetes mellitus with renal manifestations, not stated as uncontrolled   . PVD (peripheral vascular disease)   . Unspecified vitamin D deficiency   . Anemia, unspecified   . Obesity, unspecified   . Nocturia   . Pain in joint, lower leg   . Other and unspecified hyperlipidemia   . Unspecified essential hypertension   . Cataract    [facility]  Physical exam BP 138/60  Pulse 70  Resp 10  Wt 161 lb (73.029 kg)  SpO2 98%  Constitutional: She is oriented to person, place, and time. No distress.  elderly female   Mouth/Throat: Oropharynx is clear and moist. Cardiovascular: Normal rate, regular rhythm and normal heart sounds. No murmur heard. Pulmonary/Chest: Effort normal and breath sounds normal. No respiratory distress.     Abdominal: Soft. Bowel sounds are normal. She exhibits no distension.  Musculoskeletal: Normal range of motion. She exhibits no edema.  Neurological: She is alert and oriented to person, place, and time.   Psychiatric: She has a normal mood and affect. Her behavior is normal.   Labs CMP     Component Value Date/Time   NA 142 11/27/2013 1020   K 4.2 11/27/2013 1020   CL 102 11/27/2013 1020   CO2 24 11/27/2013 1020   GLUCOSE 102* 11/27/2013 1020   BUN 48* 11/27/2013 1020   CREATININE 3.32* 11/27/2013 1020   CALCIUM 9.6 11/27/2013 1020   PROT 6.7 11/27/2013 1020   AST 20 11/27/2013 1020   ALT 9 11/27/2013 1020   ALKPHOS 56 11/27/2013 1020   BILITOT 0.3 11/27/2013 1020   GFRNONAA 12* 11/27/2013 1020   GFRAA 14* 11/27/2013 1020   Assessment/plan  1. Unspecified essential hypertension Stable bp reading. Continue amlodipine 5 mg daily. No further HCTZ  2. CKD (chronic kidney disease) stage 4, GFR 15-29 ml/min Avoid NSAIDs. Monitor bp reading and continue bp meds. Monitor renal function

## 2013-12-25 LAB — BASIC METABOLIC PANEL
BUN / CREAT RATIO: 10 — AB (ref 11–26)
BUN: 21 mg/dL (ref 8–27)
CO2: 24 mmol/L (ref 18–29)
CREATININE: 2.1 mg/dL — AB (ref 0.57–1.00)
Calcium: 9.3 mg/dL (ref 8.7–10.3)
Chloride: 107 mmol/L (ref 97–108)
GFR calc non Af Amer: 22 mL/min/{1.73_m2} — ABNORMAL LOW (ref >59)
GFR, EST AFRICAN AMERICAN: 25 mL/min/{1.73_m2} — AB (ref >59)
Glucose: 91 mg/dL (ref 65–99)
Potassium: 4.4 mmol/L (ref 3.5–5.2)
SODIUM: 143 mmol/L (ref 134–144)

## 2014-01-02 ENCOUNTER — Ambulatory Visit (INDEPENDENT_AMBULATORY_CARE_PROVIDER_SITE_OTHER): Payer: Medicare Other | Admitting: Internal Medicine

## 2014-01-02 ENCOUNTER — Encounter: Payer: Self-pay | Admitting: Internal Medicine

## 2014-01-02 VITALS — BP 138/80 | HR 64 | Resp 10 | Wt 158.0 lb

## 2014-01-02 DIAGNOSIS — N318 Other neuromuscular dysfunction of bladder: Secondary | ICD-10-CM

## 2014-01-02 DIAGNOSIS — N3281 Overactive bladder: Secondary | ICD-10-CM

## 2014-01-02 DIAGNOSIS — E1129 Type 2 diabetes mellitus with other diabetic kidney complication: Secondary | ICD-10-CM

## 2014-01-02 DIAGNOSIS — D638 Anemia in other chronic diseases classified elsewhere: Secondary | ICD-10-CM

## 2014-01-02 DIAGNOSIS — I10 Essential (primary) hypertension: Secondary | ICD-10-CM

## 2014-01-02 DIAGNOSIS — N189 Chronic kidney disease, unspecified: Secondary | ICD-10-CM

## 2014-01-02 DIAGNOSIS — E785 Hyperlipidemia, unspecified: Secondary | ICD-10-CM

## 2014-01-02 MED ORDER — AMLODIPINE BESYLATE 5 MG PO TABS: ORAL_TABLET | ORAL | Status: DC

## 2014-01-02 MED ORDER — SOLIFENACIN SUCCINATE 5 MG PO TABS: ORAL_TABLET | ORAL | Status: DC

## 2014-01-02 NOTE — Progress Notes (Signed)
Patient ID: Natalie Wolfe, female   DOB: 08-26-1929, 80 y.o.   MRN: [id number]    Location:  [facility]  Place of Service: OFFICE   No Known Allergies  Chief Complaint  Patient presents with  . Follow-up    Blood pressure check     HPI:  Unspecified essential hypertension - controlled. HCTZ was stopped last visit. BP remains normal on amlodipine. No ankle edema  Type II or unspecified type diabetes mellitus with renal manifestations, not stated as uncontrolled(250.40) - controlled. AM glucose 96 today.  Chronic kidney disease (CKD): some improvement in BUN and Creatinine In May 2015.  Other and unspecified hyperlipidemia - recheck next visit  Anemia of chronic disease - recheck next visit  Overactive bladder - benefits from solifenacin (VESICARE) 5 MG tablet    Medications: Patient's Medications  New Prescriptions   No medications on file  Previous Medications   AMLODIPINE (NORVASC) 5 MG TABLET    Take 1 tablet by mouth daily for Hypertension   BAYER MICROLET LANCETS LANCETS    Use as instructed   BLOOD GLUCOSE MONITORING SUPPL (CONTOUR NEXT EZ MONITOR) W/DEVICE KIT    1 Device by Does not apply route daily.   CHOLECALCIFEROL (VITAMIN D) 1000 UNITS TABLET    Take 1,000 Units by mouth daily. Take 2 capsules once a day for vitamin d supplement   FLUOCINONIDE CREAM (LIDEX) 0.05 %    Apply topically 2 (two) times daily. Apply from neck down twice a day (provided by dermatology).   GLUCOSE BLOOD (BAYER CONTOUR NEXT TEST) TEST STRIP    Use as instructed   HYDROCORTISONE 2.5 % LOTION    Use to affected area as directed  Modified Medications   No medications on file  Discontinued Medications   No medications on file     Review of Systems  Constitutional: Negative for fever, chills, activity change and appetite change.  HENT: Negative for congestion and mouth sores.        Dentures present  Eyes:       Corrective lenses  Respiratory: Negative for cough, chest tightness  and shortness of breath.   Cardiovascular: Negative for chest pain and leg swelling.  Gastrointestinal: Negative for abdominal pain and constipation.  Endocrine:       Diabetic with renal disease.  Genitourinary: Positive for urgency and frequency. Negative for dysuria and flank pain.  Skin:       Chronic eczema causes itching  Neurological: Negative for dizziness, syncope and light-headedness.  Hematological: Negative for adenopathy.  Psychiatric/Behavioral: Negative for behavioral problems and agitation.    Filed Vitals:   01/02/14 1109  BP: 138/80  Pulse: 64  Resp: 10  Weight: 158 lb (71.668 kg)  SpO2: 96%   Body mass index is 28 kg/(m^2).  Physical Exam  Constitutional: She is oriented to person, place, and time. No distress.  Frail, elderly female  HENT:  Head: Normocephalic and atraumatic.  Mouth/Throat: Oropharynx is clear and moist. No oropharyngeal exudate.  Upper and lower dentures  Eyes: Conjunctivae and EOM are normal. Pupils are equal, round, and reactive to light.  Neck: Normal range of motion. Neck supple. No JVD present. No tracheal deviation present. No thyromegaly present.  Cardiovascular: Normal rate, regular rhythm and normal heart sounds.   No murmur heard. Pulmonary/Chest: Effort normal and breath sounds normal. No respiratory distress. She has no wheezes. She has no rales.  Abdominal: Soft. Bowel sounds are normal. She exhibits no distension and no mass. There is no  tenderness.  Musculoskeletal: Normal range of motion. She exhibits no edema and no tenderness.  Lymphadenopathy:    She has no cervical adenopathy.  Neurological: She is alert and oriented to person, place, and time. No cranial nerve deficit. Coordination normal.  Skin: Skin is warm and dry. No rash noted. She is not diaphoretic.  Chronic eczematous changes. Thinning of skin.  Psychiatric: She has a normal mood and affect. Her behavior is normal.     Labs reviewed: Orders Only on  12/17/2013  Component Date Value Ref Range Status  . Glucose 12/17/2013 91  65 - 99 mg/dL Final  . BUN 12/17/2013 21  8 - 27 mg/dL Final  . Creatinine, Ser 12/17/2013 2.10* 0.57 - 1.00 mg/dL Final  . GFR calc non Af Amer 12/17/2013 22* >59 mL/min/1.73 Final  . GFR calc Af Amer 12/17/2013 25* >59 mL/min/1.73 Final  . BUN/Creatinine Ratio 12/17/2013 10* 11 - 26 Final  . Sodium 12/17/2013 143  134 - 144 mmol/L Final  . Potassium 12/17/2013 4.4  3.5 - 5.2 mmol/L Final  . Chloride 12/17/2013 107  97 - 108 mmol/L Final  . CO2 12/17/2013 24  18 - 29 mmol/L Final  . Calcium 12/17/2013 9.3  8.7 - 10.3 mg/dL Final  Office Visit on 11/27/2013  Component Date Value Ref Range Status  . Glucose 11/27/2013 102* 65 - 99 mg/dL Final  . BUN 11/27/2013 48* 8 - 27 mg/dL Final  . Creatinine, Ser 11/27/2013 3.32* 0.57 - 1.00 mg/dL Final  . GFR calc non Af Amer 11/27/2013 12* >59 mL/min/1.73 Final  . GFR calc Af Amer 11/27/2013 14* >59 mL/min/1.73 Final  . BUN/Creatinine Ratio 11/27/2013 14  11 - 26 Final  . Sodium 11/27/2013 142  134 - 144 mmol/L Final  . Potassium 11/27/2013 4.2  3.5 - 5.2 mmol/L Final  . Chloride 11/27/2013 102  97 - 108 mmol/L Final  . CO2 11/27/2013 24  18 - 29 mmol/L Final  . Calcium 11/27/2013 9.6  8.7 - 10.3 mg/dL Final  . Total Protein 11/27/2013 6.7  6.0 - 8.5 g/dL Final  . Albumin 11/27/2013 4.2  3.5 - 4.7 g/dL Final  . Globulin, Total 11/27/2013 2.5  1.5 - 4.5 g/dL Final  . Albumin/Globulin Ratio 11/27/2013 1.7  1.1 - 2.5 Final  . Total Bilirubin 11/27/2013 0.3  0.0 - 1.2 mg/dL Final  . Alkaline Phosphatase 11/27/2013 56  39 - 117 IU/L Final  . AST 11/27/2013 20  0 - 40 IU/L Final  . ALT 11/27/2013 9  0 - 32 IU/L Final  . Cholesterol, Total 11/27/2013 160  100 - 199 mg/dL Final  . Triglycerides 11/27/2013 120  0 - 149 mg/dL Final  . HDL 11/27/2013 43  >39 mg/dL Final   Comment: According to ATP-III Guidelines, HDL-C >59 mg/dL is considered a                           negative risk factor for CHD.  Marland Kitchen VLDL Cholesterol Cal 11/27/2013 24  5 - 40 mg/dL Final  . LDL Calculated 11/27/2013 93  0 - 99 mg/dL Final  . Chol/HDL Ratio 11/27/2013 3.7  0.0 - 4.4 ratio units Final   Comment:                                   T. Chol/HDL Ratio  Men  Women                                                        1/2 Avg.Risk  3.4    3.3                                                            Avg.Risk  5.0    4.4                                                         2X Avg.Risk  9.6    7.1                                                         3X Avg.Risk 23.4   11.0  . WBC 11/27/2013 4.8  3.4 - 10.8 x10E3/uL Final  . RBC 11/27/2013 3.57* 3.77 - 5.28 x10E6/uL Final  . Hemoglobin 11/27/2013 9.7* 11.1 - 15.9 g/dL Final  . HCT 11/27/2013 30.0* 34.0 - 46.6 % Final  . MCV 11/27/2013 84  79 - 97 fL Final  . MCH 11/27/2013 27.2  26.6 - 33.0 pg Final  . MCHC 11/27/2013 32.3  31.5 - 35.7 g/dL Final  . RDW 11/27/2013 13.7  12.3 - 15.4 % Final  . Neutrophils Relative % 11/27/2013 53   Final  . Lymphs 11/27/2013 31   Final  . Monocytes 11/27/2013 9   Final  . Eos 11/27/2013 6   Final  . Basos 11/27/2013 1   Final  . Neutrophils Absolute 11/27/2013 2.6  1.4 - 7.0 x10E3/uL Final  . Lymphocytes Absolute 11/27/2013 1.5  0.7 - 3.1 x10E3/uL Final  . Monocytes Absolute 11/27/2013 0.4  0.1 - 0.9 x10E3/uL Final  . Eosinophils Absolute 11/27/2013 0.3  0.0 - 0.4 x10E3/uL Final  . Basophils Absolute 11/27/2013 0.0  0.0 - 0.2 x10E3/uL Final  . Immature Granulocytes 11/27/2013 0   Final  . Immature Grans (Abs) 11/27/2013 0.0  0.0 - 0.1 x10E3/uL Final  . Hemoglobin A1C 11/27/2013 6.6* 4.8 - 5.6 % Final   Comment:          Increased risk for diabetes: 5.7 - 6.4                                   Diabetes: >6.4                                   Glycemic control for adults with diabetes: <7.0  . Estimated average glucose  11/27/2013 143   Final  . Vit D, 1,25-Dihydroxy 11/27/2013 40.7  19.9 - 79.3 pg/mL Final  Assessment/Plan  1. Unspecified essential hypertension controlled - Basic metabolic panel; Future - amLODipine (NORVASC) 5 MG tablet; Take 1 tablet by mouth daily for Hypertension  Dispense: 90 tablet; Refill: 4  2. Type II or unspecified type diabetes mellitus with renal manifestations, not stated as uncontrolled(250.40) controlled - Hemoglobin A1c; Future - Basic metabolic panel; Future - Microalbumin, urine; Future  3. Chronic kidney disease (CKD) Slight improvement in BUN and creatinine. She is to see Dr. Graylon Gunning, nephrologist, soon.  4. Other and unspecified hyperlipidemia - Lipid panel; Future  5. Anemia of chronic disease - CBC With differential/Platelet; Future  6. Overactive bladder - solifenacin (VESICARE) 5 MG tablet; One daily to help bladder control  Dispense: 30 tablet; Refill: 5

## 2014-01-02 NOTE — Patient Instructions (Signed)
Continue medications as listed 

## 2014-01-23 ENCOUNTER — Telehealth: Payer: Self-pay | Admitting: *Deleted

## 2014-01-23 NOTE — Telephone Encounter (Signed)
Patient called and stated that the new medication Amlodipine that you put patient on at appointment is causing her to be sick and weak. Patient has stopped the medication, but wants to know if you need to change it to something else. Please Advise.

## 2014-01-23 NOTE — Telephone Encounter (Signed)
We have been steadily reducing her medications. This was a part of the Lotrel she took previously. She can stop it. I do not think she needs to go on another medication yet. See me as planned in August.

## 2014-01-24 NOTE — Telephone Encounter (Signed)
Patient Notified and agreed. 

## 2014-02-05 ENCOUNTER — Telehealth: Payer: Self-pay | Admitting: *Deleted

## 2014-02-05 NOTE — Telephone Encounter (Signed)
Received paperwork for knee brace. Confirmed with patient that she does want it. Filled out and left for Dr. Chilton SiGreen to sign

## 2014-03-25 ENCOUNTER — Telehealth: Payer: Self-pay

## 2014-03-25 NOTE — Telephone Encounter (Signed)
Paperwork received requesting Knee brace from [facility]. It appears paperwork was received in June 2015.  I called patient to verifiy if she ever received brace in June, it appears paperwork was completed and given to provider. I was unable to locate paperwork under media tab.   No answer/no voicemail. I will try to reach patient again later. (paperwork is located in the triage area)

## 2014-03-28 ENCOUNTER — Other Ambulatory Visit (HOSPITAL_COMMUNITY): Payer: Self-pay

## 2014-03-28 NOTE — Telephone Encounter (Signed)
Patient states she already received knee brace and we do not need to complete any paperwork

## 2014-03-29 ENCOUNTER — Ambulatory Visit (HOSPITAL_COMMUNITY)
Admission: RE | Admit: 2014-03-29 | Discharge: 2014-03-29 | Disposition: A | Discharge status: Home / Self Care | Payer: Medicare Other | Source: Ambulatory Visit | Attending: Nephrology | Admitting: Nephrology

## 2014-03-29 DIAGNOSIS — D631 Anemia in chronic kidney disease: Secondary | ICD-10-CM | POA: Diagnosis not present

## 2014-03-29 DIAGNOSIS — N184 Chronic kidney disease, stage 4 (severe): Secondary | ICD-10-CM | POA: Diagnosis not present

## 2014-03-29 DIAGNOSIS — N039 Chronic nephritic syndrome with unspecified morphologic changes: Secondary | ICD-10-CM | POA: Diagnosis present

## 2014-03-29 DIAGNOSIS — I129 Hypertensive chronic kidney disease with stage 1 through stage 4 chronic kidney disease, or unspecified chronic kidney disease: Secondary | ICD-10-CM | POA: Diagnosis present

## 2014-03-29 MED ORDER — SODIUM CHLORIDE 0.9 % IV SOLN
1020.0000 mg | Freq: Once | INTRAVENOUS | Status: AC
  Administered 2014-03-29: 1020 mg via INTRAVENOUS
  Filled 2014-03-29: qty 34

## 2014-04-01 ENCOUNTER — Other Ambulatory Visit: Payer: Medicare Other

## 2014-04-01 DIAGNOSIS — D638 Anemia in other chronic diseases classified elsewhere: Secondary | ICD-10-CM

## 2014-04-01 DIAGNOSIS — I10 Essential (primary) hypertension: Secondary | ICD-10-CM

## 2014-04-01 DIAGNOSIS — E1129 Type 2 diabetes mellitus with other diabetic kidney complication: Secondary | ICD-10-CM

## 2014-04-01 DIAGNOSIS — E785 Hyperlipidemia, unspecified: Secondary | ICD-10-CM

## 2014-04-02 LAB — CBC WITH DIFFERENTIAL
Basophils Absolute: 0 10*3/uL (ref 0.0–0.2)
Basos: 1 %
Eos: 7 %
Eosinophils Absolute: 0.4 10*3/uL (ref 0.0–0.4)
HCT: 32 % — ABNORMAL LOW (ref 34.0–46.6)
Hemoglobin: 10 g/dL — ABNORMAL LOW (ref 11.1–15.9)
IMMATURE GRANS (ABS): 0 10*3/uL (ref 0.0–0.1)
Immature Granulocytes: 0 %
LYMPHS ABS: 1 10*3/uL (ref 0.7–3.1)
Lymphs: 16 %
MCH: 25.8 pg — ABNORMAL LOW (ref 26.6–33.0)
MCHC: 31.3 g/dL — ABNORMAL LOW (ref 31.5–35.7)
MCV: 83 fL (ref 79–97)
Monocytes Absolute: 0.6 10*3/uL (ref 0.1–0.9)
Monocytes: 9 %
Neutrophils Absolute: 4.1 10*3/uL (ref 1.4–7.0)
Neutrophils Relative %: 67 %
PLATELETS: 381 10*3/uL — AB (ref 150–379)
RBC: 3.87 x10E6/uL (ref 3.77–5.28)
RDW: 14.2 % (ref 12.3–15.4)
WBC: 6.1 10*3/uL (ref 3.4–10.8)

## 2014-04-02 LAB — BASIC METABOLIC PANEL
BUN/Creatinine Ratio: 10 — ABNORMAL LOW (ref 11–26)
BUN: 20 mg/dL (ref 8–27)
CHLORIDE: 100 mmol/L (ref 97–108)
CO2: 22 mmol/L (ref 18–29)
Calcium: 9.8 mg/dL (ref 8.7–10.3)
Creatinine, Ser: 2.04 mg/dL — ABNORMAL HIGH (ref 0.57–1.00)
GFR calc Af Amer: 26 mL/min/{1.73_m2} — ABNORMAL LOW (ref >59)
GFR calc non Af Amer: 22 mL/min/{1.73_m2} — ABNORMAL LOW (ref >59)
GLUCOSE: 114 mg/dL — AB (ref 65–99)
Potassium: 4.6 mmol/L (ref 3.5–5.2)
Sodium: 141 mmol/L (ref 134–144)

## 2014-04-02 LAB — LIPID PANEL
CHOL/HDL RATIO: 3.9 ratio (ref 0.0–4.4)
CHOLESTEROL TOTAL: 178 mg/dL (ref 100–199)
HDL: 46 mg/dL (ref >39)
LDL Calculated: 111 mg/dL — ABNORMAL HIGH (ref 0–99)
Triglycerides: 105 mg/dL (ref 0–149)
VLDL Cholesterol Cal: 21 mg/dL (ref 5–40)

## 2014-04-02 LAB — HEMOGLOBIN A1C
ESTIMATED AVERAGE GLUCOSE: 140 mg/dL
Hgb A1c MFr Bld: 6.5 % — ABNORMAL HIGH (ref 4.8–5.6)

## 2014-04-02 LAB — MICROALBUMIN, URINE

## 2014-04-03 ENCOUNTER — Encounter: Payer: Self-pay | Admitting: Internal Medicine

## 2014-04-03 ENCOUNTER — Other Ambulatory Visit: Payer: Self-pay | Admitting: *Deleted

## 2014-04-03 ENCOUNTER — Ambulatory Visit (INDEPENDENT_AMBULATORY_CARE_PROVIDER_SITE_OTHER): Payer: Medicare Other | Admitting: Internal Medicine

## 2014-04-03 VITALS — BP 142/76 | HR 63 | Temp 98.3°F | Ht 63.25 in | Wt 142.0 lb

## 2014-04-03 DIAGNOSIS — E785 Hyperlipidemia, unspecified: Secondary | ICD-10-CM

## 2014-04-03 DIAGNOSIS — I10 Essential (primary) hypertension: Secondary | ICD-10-CM

## 2014-04-03 DIAGNOSIS — N318 Other neuromuscular dysfunction of bladder: Secondary | ICD-10-CM

## 2014-04-03 DIAGNOSIS — N184 Chronic kidney disease, stage 4 (severe): Secondary | ICD-10-CM

## 2014-04-03 DIAGNOSIS — N3281 Overactive bladder: Secondary | ICD-10-CM

## 2014-04-03 DIAGNOSIS — M1710 Unilateral primary osteoarthritis, unspecified knee: Secondary | ICD-10-CM

## 2014-04-03 DIAGNOSIS — D638 Anemia in other chronic diseases classified elsewhere: Secondary | ICD-10-CM

## 2014-04-03 DIAGNOSIS — E1129 Type 2 diabetes mellitus with other diabetic kidney complication: Secondary | ICD-10-CM

## 2014-04-03 DIAGNOSIS — IMO0002 Reserved for concepts with insufficient information to code with codable children: Secondary | ICD-10-CM

## 2014-04-03 MED ORDER — SOLIFENACIN SUCCINATE 5 MG PO TABS: ORAL_TABLET | ORAL | Status: DC

## 2014-04-03 NOTE — Addendum Note (Signed)
Addended by: Marvia Pickles on: 04/03/2014 03:26 PM   Modules accepted: Orders

## 2014-04-03 NOTE — Progress Notes (Signed)
Patient ID: Natalie Wolfe, female   DOB: 09/02/1929, 80 y.o.   MRN: [id number]    Location:      Place of Service:      No Known Allergies  Chief Complaint  Patient presents with  . Medical Management of Chronic Issues    3 month f/u & discuss labs (printed)    HPI:  Overactive bladder - benefits from solifenacin (VESICARE) 5 MG tablet. No constipation.  Other and unspecified hyperlipidemia: controlled  Type II or unspecified type diabetes mellitus with renal manifestations, not stated as uncontrolled(250.40):controlled  Unspecified essential hypertension:controlled  CKD (chronic kidney disease) stage 4, GFR 15-29 ml/min:stable  Anemia of chronic disease: stable  Arthritis of knee: improved    Medications: Patient's Medications  New Prescriptions   No medications on file  Previous Medications   ACETAMINOPHEN (TYLENOL) 325 MG TABLET    Take 650 mg by mouth daily as needed.   AMLODIPINE (NORVASC) 5 MG TABLET    Take 1 tablet by mouth daily for Hypertension   BAYER MICROLET LANCETS LANCETS    Use as instructed   BLOOD GLUCOSE MONITORING SUPPL (CONTOUR NEXT EZ MONITOR) W/DEVICE KIT    1 Device by Does not apply route daily.   CHOLECALCIFEROL (VITAMIN D) 1000 UNITS TABLET    Take 1,000 Units by mouth daily. Take 2 capsules once a day for vitamin d supplement   FLUOCINONIDE CREAM (LIDEX) 0.05 %    Apply topically 2 (two) times daily. Apply from neck down twice a day (provided by dermatology).   GLIMEPIRIDE (AMARYL) 2 MG TABLET    2 mg. Take 1/2 tablet by mouth at bedtime.   GLUCOSE BLOOD (BAYER CONTOUR NEXT TEST) TEST STRIP    Use as instructed   HYDROCORTISONE 2.5 % LOTION    Use to affected area as directed   SOLIFENACIN (VESICARE) 5 MG TABLET    One daily to help bladder control  Modified Medications   No medications on file  Discontinued Medications   No medications on file     Review of Systems  Constitutional: Negative for fever, chills, activity change  and appetite change.  HENT: Negative for congestion and mouth sores.        Dentures present  Eyes:       Corrective lenses  Respiratory: Negative for cough, chest tightness and shortness of breath.   Cardiovascular: Negative for chest pain and leg swelling.  Gastrointestinal: Negative for abdominal pain and constipation.  Endocrine:       Diabetic with renal disease.  Genitourinary: Positive for urgency and frequency. Negative for dysuria and flank pain.  Skin:       Chronic eczema causes itching  Neurological: Negative for dizziness, syncope and light-headedness.  Hematological: Negative for adenopathy.  Psychiatric/Behavioral: Negative for behavioral problems and agitation.    Filed Vitals:   04/03/14 1229  BP: 142/76  Pulse: 63  Temp: 98.3 F (36.8 C)  TempSrc: Oral  Height: 5' 3.25" (1.607 m)  Weight: 142 lb (64.411 kg)  SpO2: 98%   Body mass index is 24.94 kg/(m^2).  Physical Exam  Constitutional: She is oriented to person, place, and time. No distress.  Frail, elderly female  HENT:  Head: Normocephalic and atraumatic.  Mouth/Throat: Oropharynx is clear and moist. No oropharyngeal exudate.  Upper and lower dentures  Eyes: Conjunctivae and EOM are normal. Pupils are equal, round, and reactive to light.  Neck: Normal range of motion. Neck supple. No JVD present. No tracheal deviation present. No  thyromegaly present.  Cardiovascular: Normal rate, regular rhythm and normal heart sounds.   No murmur heard. Pulmonary/Chest: Effort normal and breath sounds normal. No respiratory distress. She has no wheezes. She has no rales.  Abdominal: Soft. Bowel sounds are normal. She exhibits no distension and no mass. There is no tenderness.  Musculoskeletal: Normal range of motion. She exhibits no edema and no tenderness.  Lymphadenopathy:    She has no cervical adenopathy.  Neurological: She is alert and oriented to person, place, and time. No cranial nerve deficit. Coordination  normal.  Skin: Skin is warm and dry. No rash noted. She is not diaphoretic.  Chronic eczematous changes. Thinning of skin.  Psychiatric: She has a normal mood and affect. Her behavior is normal.     Labs reviewed: Lab on 04/01/2014  Component Date Value Ref Range Status  . WBC 04/01/2014 6.1  3.4 - 10.8 x10E3/uL Final  . RBC 04/01/2014 3.87  3.77 - 5.28 x10E6/uL Final  . Hemoglobin 04/01/2014 10.0* 11.1 - 15.9 g/dL Final  . HCT 04/01/2014 32.0* 34.0 - 46.6 % Final  . MCV 04/01/2014 83  79 - 97 fL Final  . MCH 04/01/2014 25.8* 26.6 - 33.0 pg Final  . MCHC 04/01/2014 31.3* 31.5 - 35.7 g/dL Final  . RDW 04/01/2014 14.2  12.3 - 15.4 % Final  . Platelets 04/01/2014 381* 150 - 379 x10E3/uL Final  . Neutrophils Relative % 04/01/2014 67   Final  . Lymphs 04/01/2014 16   Final  . Monocytes 04/01/2014 9   Final  . Eos 04/01/2014 7   Final  . Basos 04/01/2014 1   Final  . Neutrophils Absolute 04/01/2014 4.1  1.4 - 7.0 x10E3/uL Final  . Lymphocytes Absolute 04/01/2014 1.0  0.7 - 3.1 x10E3/uL Final  . Monocytes Absolute 04/01/2014 0.6  0.1 - 0.9 x10E3/uL Final  . Eosinophils Absolute 04/01/2014 0.4  0.0 - 0.4 x10E3/uL Final  . Basophils Absolute 04/01/2014 0.0  0.0 - 0.2 x10E3/uL Final  . Immature Granulocytes 04/01/2014 0   Final  . Immature Grans (Abs) 04/01/2014 0.0  0.0 - 0.1 x10E3/uL Final  . Hemoglobin A1C 04/01/2014 6.5* 4.8 - 5.6 % Final   Comment:          Increased risk for diabetes: 5.7 - 6.4                                   Diabetes: >6.4                                   Glycemic control for adults with diabetes: <7.0  . Estimated average glucose 04/01/2014 140   Final  . Cholesterol, Total 04/01/2014 178  100 - 199 mg/dL Final  . Triglycerides 04/01/2014 105  0 - 149 mg/dL Final  . HDL 04/01/2014 46  >39 mg/dL Final   Comment: According to ATP-III Guidelines, HDL-C >59 mg/dL is considered a                          negative risk factor for CHD.  Marland Kitchen VLDL Cholesterol Cal  04/01/2014 21  5 - 40 mg/dL Final  . LDL Calculated 04/01/2014 111* 0 - 99 mg/dL Final  . Chol/HDL Ratio 04/01/2014 3.9  0.0 - 4.4 ratio units Final   Comment:  T. Chol/HDL Ratio                                                                      Men  Women                                                        1/2 Avg.Risk  3.4    3.3                                                            Avg.Risk  5.0    4.4                                                         2X Avg.Risk  9.6    7.1                                                         3X Avg.Risk 23.4   11.0  . Microalbum.,Leonard Schwartz 04/01/2014 CANCELED   Final-Edited   Comment: Test Not Performed.  Patient was unable to void at the time of                          collection.  The following test(s) were not performed:                                                    Result canceled by the ancillary  . Glucose 04/01/2014 114* 65 - 99 mg/dL Final  . BUN 04/01/2014 20  8 - 27 mg/dL Final  . Creatinine, Ser 04/01/2014 2.04* 0.57 - 1.00 mg/dL Final  . GFR calc non Af Amer 04/01/2014 22* >59 mL/min/1.73 Final  . GFR calc Af Amer 04/01/2014 26* >59 mL/min/1.73 Final  . BUN/Creatinine Ratio 04/01/2014 10* 11 - 26 Final  . Sodium 04/01/2014 141  134 - 144 mmol/L Final  . Potassium 04/01/2014 4.6  3.5 - 5.2 mmol/L Final  . Chloride 04/01/2014 100  97 - 108 mmol/L Final  . CO2 04/01/2014 22  18 - 29 mmol/L Final  . Calcium 04/01/2014 9.8  8.7 - 10.3 mg/dL Final    Assessment/Plan  Overactive bladder  - Plan: solifenacin (VESICARE) 5 MG tablet  Other and unspecified hyperlipidemia - Plan: Lipid panel  Type II or unspecified type diabetes mellitus with renal manifestations, not stated  as uncontrolled(250.40) - Plan: Comprehensive metabolic panel, Hemoglobin A1c  Unspecified essential hypertension - Plan: Comprehensive metabolic panel  CKD (chronic kidney disease) stage 4, GFR 15-29 ml/min -  Plan: Comprehensive metabolic panel  Anemia of chronic disease - Plan: CBC With differential/Platelet  Arthritis of knee: improved

## 2014-04-04 LAB — MICROALBUMIN / CREATININE URINE RATIO
CREATININE UR: 158.4 mg/dL (ref 15.0–278.0)
MICROALB/CREAT RATIO: 24.1 mg/g creat (ref 0.0–30.0)
Microalbumin, Urine: 38.2 ug/mL — ABNORMAL HIGH (ref 0.0–17.0)

## 2014-04-24 ENCOUNTER — Other Ambulatory Visit: Payer: Self-pay | Admitting: *Deleted

## 2014-04-24 MED ORDER — GLIMEPIRIDE 2 MG PO TABS: ORAL_TABLET | ORAL | Status: DC

## 2014-04-24 NOTE — Telephone Encounter (Signed)
Patient Requested and Faxed to Mail Order

## 2014-05-08 ENCOUNTER — Encounter: Payer: Self-pay | Admitting: Internal Medicine

## 2014-05-08 LAB — HM DIABETES EYE EXAM

## 2014-05-23 ENCOUNTER — Telehealth: Payer: Self-pay | Admitting: *Deleted

## 2014-05-23 NOTE — Telephone Encounter (Signed)
Received Form from [facility] # [phone] Fax# [phone] for Diabetic Supplies. Filled out and given to Dr. Chilton SiGreen for Signature.

## 2014-05-29 ENCOUNTER — Telehealth: Payer: Self-pay | Admitting: *Deleted

## 2014-05-29 NOTE — Telephone Encounter (Signed)
Received fax form from [facility]. Filled out and given to Dr. Chilton SiGreen to sign off. Faxed back to Fax # [phone] For equipment [phone] Hinged Knee Brace Range of Motion and [id number] Suspension Sleeve Dx: 715.16, M17.11 Osteoarthritis

## 2014-05-31 NOTE — Telephone Encounter (Signed)
Call was received that paperwork was never received. I printed order (media tab) and re-faxed

## 2014-08-20 ENCOUNTER — Other Ambulatory Visit: Payer: Self-pay | Admitting: *Deleted

## 2014-08-20 DIAGNOSIS — N3281 Overactive bladder: Secondary | ICD-10-CM

## 2014-08-20 MED ORDER — SOLIFENACIN SUCCINATE 5 MG PO TABS: ORAL_TABLET | ORAL | Status: DC

## 2014-08-20 NOTE — Telephone Encounter (Signed)
[facility] 

## 2014-08-29 ENCOUNTER — Other Ambulatory Visit: Payer: Medicare Other

## 2014-08-29 DIAGNOSIS — N184 Chronic kidney disease, stage 4 (severe): Secondary | ICD-10-CM

## 2014-08-29 DIAGNOSIS — I10 Essential (primary) hypertension: Secondary | ICD-10-CM

## 2014-08-29 DIAGNOSIS — E1129 Type 2 diabetes mellitus with other diabetic kidney complication: Secondary | ICD-10-CM

## 2014-08-29 DIAGNOSIS — D638 Anemia in other chronic diseases classified elsewhere: Secondary | ICD-10-CM

## 2014-08-29 DIAGNOSIS — E785 Hyperlipidemia, unspecified: Secondary | ICD-10-CM

## 2014-08-30 ENCOUNTER — Telehealth: Payer: Self-pay | Admitting: Internal Medicine

## 2014-08-30 LAB — COMPREHENSIVE METABOLIC PANEL
ALBUMIN: 3.5 g/dL (ref 3.5–4.7)
ALT: 5 IU/L (ref 0–32)
AST: 14 IU/L (ref 0–40)
Albumin/Globulin Ratio: 1.1 (ref 1.1–2.5)
Alkaline Phosphatase: 92 IU/L (ref 39–117)
BILIRUBIN TOTAL: 0.3 mg/dL (ref 0.0–1.2)
BUN / CREAT RATIO: 10 — AB (ref 11–26)
BUN: 95 mg/dL (ref 8–27)
CHLORIDE: 98 mmol/L (ref 97–108)
CO2: 12 mmol/L — AB (ref 18–29)
CREATININE: 9.9 mg/dL — AB (ref 0.57–1.00)
Calcium: 8.8 mg/dL (ref 8.7–10.3)
GFR calc non Af Amer: 3 mL/min/{1.73_m2} — ABNORMAL LOW (ref >59)
GFR, EST AFRICAN AMERICAN: 4 mL/min/{1.73_m2} — AB (ref >59)
GLOBULIN, TOTAL: 3.3 g/dL (ref 1.5–4.5)
Glucose: 92 mg/dL (ref 65–99)
POTASSIUM: 5.9 mmol/L — AB (ref 3.5–5.2)
Sodium: 134 mmol/L (ref 134–144)
TOTAL PROTEIN: 6.8 g/dL (ref 6.0–8.5)

## 2014-08-30 LAB — HEMOGLOBIN A1C
Est. average glucose Bld gHb Est-mCnc: 88 mg/dL
Hgb A1c MFr Bld: 4.7 % — ABNORMAL LOW (ref 4.8–5.6)

## 2014-08-30 LAB — CBC WITH DIFFERENTIAL
BASOS ABS: 0 10*3/uL (ref 0.0–0.2)
Basos: 0 %
EOS: 1 %
Eosinophils Absolute: 0.1 10*3/uL (ref 0.0–0.4)
HCT: 28.6 % — ABNORMAL LOW (ref 34.0–46.6)
HEMOGLOBIN: 9.3 g/dL — AB (ref 11.1–15.9)
Immature Grans (Abs): 0 10*3/uL (ref 0.0–0.1)
Immature Granulocytes: 0 %
Lymphocytes Absolute: 0.6 10*3/uL — ABNORMAL LOW (ref 0.7–3.1)
Lymphs: 7 %
MCH: 25.1 pg — ABNORMAL LOW (ref 26.6–33.0)
MCHC: 32.5 g/dL (ref 31.5–35.7)
MCV: 77 fL — AB (ref 79–97)
MONOCYTES: 6 %
MONOS ABS: 0.5 10*3/uL (ref 0.1–0.9)
NEUTROS PCT: 86 %
Neutrophils Absolute: 7.2 10*3/uL — ABNORMAL HIGH (ref 1.4–7.0)
Platelets: 212 10*3/uL (ref 150–379)
RBC: 3.71 x10E6/uL — ABNORMAL LOW (ref 3.77–5.28)
RDW: 18.3 % — ABNORMAL HIGH (ref 12.3–15.4)
WBC: 8.5 10*3/uL (ref 3.4–10.8)

## 2014-08-30 LAB — LIPID PANEL
CHOLESTEROL TOTAL: 187 mg/dL (ref 100–199)
Chol/HDL Ratio: 3.6 ratio units (ref 0.0–4.4)
HDL: 52 mg/dL (ref >39)
LDL CALC: 112 mg/dL — AB (ref 0–99)
Triglycerides: 115 mg/dL (ref 0–149)
VLDL Cholesterol Cal: 23 mg/dL (ref 5–40)

## 2014-08-30 NOTE — Telephone Encounter (Signed)
severely reduced renal fxn (Cr 9.9); pt needs to go to the ED for eval of acute on chronic renal failure if not done already

## 2014-08-31 ENCOUNTER — Other Ambulatory Visit: Payer: Self-pay

## 2014-08-31 ENCOUNTER — Inpatient Hospital Stay (HOSPITAL_COMMUNITY)
Admission: EM | Admit: 2014-08-31 | Discharge: 2014-09-09 | DRG: 673 | Disposition: A | Discharge status: Home Health | Payer: Medicare Other | Attending: Internal Medicine | Admitting: Internal Medicine

## 2014-08-31 ENCOUNTER — Encounter (HOSPITAL_COMMUNITY): Payer: Self-pay | Admitting: Emergency Medicine

## 2014-08-31 ENCOUNTER — Inpatient Hospital Stay (HOSPITAL_COMMUNITY): Payer: Medicare Other

## 2014-08-31 ENCOUNTER — Emergency Department (HOSPITAL_COMMUNITY): Payer: Medicare Other

## 2014-08-31 DIAGNOSIS — J449 Chronic obstructive pulmonary disease, unspecified: Secondary | ICD-10-CM | POA: Diagnosis present

## 2014-08-31 DIAGNOSIS — Z87891 Personal history of nicotine dependence: Secondary | ICD-10-CM | POA: Diagnosis not present

## 2014-08-31 DIAGNOSIS — N184 Chronic kidney disease, stage 4 (severe): Secondary | ICD-10-CM

## 2014-08-31 DIAGNOSIS — J90 Pleural effusion, not elsewhere classified: Secondary | ICD-10-CM | POA: Diagnosis not present

## 2014-08-31 DIAGNOSIS — E1122 Type 2 diabetes mellitus with diabetic chronic kidney disease: Secondary | ICD-10-CM | POA: Diagnosis present

## 2014-08-31 DIAGNOSIS — I120 Hypertensive chronic kidney disease with stage 5 chronic kidney disease or end stage renal disease: Secondary | ICD-10-CM | POA: Diagnosis present

## 2014-08-31 DIAGNOSIS — J9601 Acute respiratory failure with hypoxia: Secondary | ICD-10-CM | POA: Diagnosis not present

## 2014-08-31 DIAGNOSIS — N179 Acute kidney failure, unspecified: Secondary | ICD-10-CM | POA: Diagnosis present

## 2014-08-31 DIAGNOSIS — R0602 Shortness of breath: Secondary | ICD-10-CM | POA: Insufficient documentation

## 2014-08-31 DIAGNOSIS — E162 Hypoglycemia, unspecified: Secondary | ICD-10-CM

## 2014-08-31 DIAGNOSIS — N186 End stage renal disease: Secondary | ICD-10-CM | POA: Diagnosis present

## 2014-08-31 DIAGNOSIS — E11649 Type 2 diabetes mellitus with hypoglycemia without coma: Secondary | ICD-10-CM | POA: Diagnosis present

## 2014-08-31 DIAGNOSIS — E872 Acidosis: Secondary | ICD-10-CM | POA: Diagnosis present

## 2014-08-31 DIAGNOSIS — D638 Anemia in other chronic diseases classified elsewhere: Secondary | ICD-10-CM | POA: Diagnosis present

## 2014-08-31 DIAGNOSIS — Z419 Encounter for procedure for purposes other than remedying health state, unspecified: Secondary | ICD-10-CM

## 2014-08-31 DIAGNOSIS — I10 Essential (primary) hypertension: Secondary | ICD-10-CM

## 2014-08-31 DIAGNOSIS — E860 Dehydration: Secondary | ICD-10-CM | POA: Diagnosis present

## 2014-08-31 DIAGNOSIS — E875 Hyperkalemia: Secondary | ICD-10-CM | POA: Diagnosis present

## 2014-08-31 DIAGNOSIS — R531 Weakness: Secondary | ICD-10-CM | POA: Diagnosis present

## 2014-08-31 DIAGNOSIS — I739 Peripheral vascular disease, unspecified: Secondary | ICD-10-CM | POA: Diagnosis present

## 2014-08-31 DIAGNOSIS — R197 Diarrhea, unspecified: Secondary | ICD-10-CM | POA: Diagnosis present

## 2014-08-31 LAB — URINALYSIS, ROUTINE W REFLEX MICROSCOPIC
GLUCOSE, UA: NEGATIVE mg/dL
KETONES UR: NEGATIVE mg/dL
NITRITE: NEGATIVE
Protein, ur: 100 mg/dL — AB
Specific Gravity, Urine: 1.014 (ref 1.005–1.030)
Urobilinogen, UA: 0.2 mg/dL (ref 0.0–1.0)
pH: 7 (ref 5.0–8.0)

## 2014-08-31 LAB — CBC WITH DIFFERENTIAL/PLATELET
Basophils Absolute: 0 10*3/uL (ref 0.0–0.1)
Basophils Relative: 0 % (ref 0–1)
Eosinophils Absolute: 0 10*3/uL (ref 0.0–0.7)
Eosinophils Relative: 0 % (ref 0–5)
HEMATOCRIT: 24.8 % — AB (ref 36.0–46.0)
Hemoglobin: 8.2 g/dL — ABNORMAL LOW (ref 12.0–15.0)
LYMPHS PCT: 4 % — AB (ref 12–46)
Lymphs Abs: 0.2 10*3/uL — ABNORMAL LOW (ref 0.7–4.0)
MCH: 25.2 pg — AB (ref 26.0–34.0)
MCHC: 33.1 g/dL (ref 30.0–36.0)
MCV: 76.1 fL — ABNORMAL LOW (ref 78.0–100.0)
Monocytes Absolute: 0.3 10*3/uL (ref 0.1–1.0)
Monocytes Relative: 4 % (ref 3–12)
Neutro Abs: 5.6 10*3/uL (ref 1.7–7.7)
Neutrophils Relative %: 92 % — ABNORMAL HIGH (ref 43–77)
Platelets: 179 10*3/uL (ref 150–400)
RBC: 3.26 MIL/uL — AB (ref 3.87–5.11)
RDW: 17.3 % — ABNORMAL HIGH (ref 11.5–15.5)
WBC: 6.1 10*3/uL (ref 4.0–10.5)

## 2014-08-31 LAB — COMPREHENSIVE METABOLIC PANEL
ALK PHOS: 75 U/L (ref 39–117)
ALT: 10 U/L (ref 0–35)
AST: 21 U/L (ref 0–37)
Albumin: 3.2 g/dL — ABNORMAL LOW (ref 3.5–5.2)
Anion gap: 14 (ref 5–15)
BUN: 104 mg/dL — ABNORMAL HIGH (ref 6–23)
CO2: 15 mmol/L — AB (ref 19–32)
CREATININE: 11.75 mg/dL — AB (ref 0.50–1.10)
Calcium: 8.7 mg/dL (ref 8.4–10.5)
Chloride: 104 mmol/L (ref 96–112)
GFR calc Af Amer: 3 mL/min — ABNORMAL LOW (ref >90)
GFR calc non Af Amer: 3 mL/min — ABNORMAL LOW (ref >90)
Glucose, Bld: 45 mg/dL — ABNORMAL LOW (ref 70–99)
Potassium: 4.9 mmol/L (ref 3.5–5.1)
SODIUM: 133 mmol/L — AB (ref 135–145)
Total Bilirubin: 0.5 mg/dL (ref 0.3–1.2)
Total Protein: 6.7 g/dL (ref 6.0–8.3)

## 2014-08-31 LAB — CBG MONITORING, ED
Glucose-Capillary: 33 mg/dL — CL (ref 70–99)
Glucose-Capillary: 35 mg/dL — CL (ref 70–99)
Glucose-Capillary: 133 mg/dL — ABNORMAL HIGH (ref 70–99)

## 2014-08-31 LAB — GLUCOSE, CAPILLARY
GLUCOSE-CAPILLARY: 30 mg/dL — AB (ref 70–99)
GLUCOSE-CAPILLARY: 57 mg/dL — AB (ref 70–99)
GLUCOSE-CAPILLARY: 69 mg/dL — AB (ref 70–99)
GLUCOSE-CAPILLARY: 84 mg/dL (ref 70–99)
Glucose-Capillary: 131 mg/dL — ABNORMAL HIGH (ref 70–99)
Glucose-Capillary: 25 mg/dL — CL (ref 70–99)
Glucose-Capillary: 149 mg/dL — ABNORMAL HIGH (ref 70–99)

## 2014-08-31 LAB — URINE MICROSCOPIC-ADD ON

## 2014-08-31 LAB — POC OCCULT BLOOD, ED: Fecal Occult Bld: NEGATIVE

## 2014-08-31 LAB — I-STAT TROPONIN, ED: Troponin i, poc: 0.03 ng/mL (ref 0.00–0.08)

## 2014-08-31 MED ORDER — DEXTROSE 50 % IV SOLN
25.0000 g | Freq: Once | INTRAVENOUS | Status: AC
Start: 2014-08-31 — End: 2014-08-31
  Administered 2014-08-31: 25 g via INTRAVENOUS

## 2014-08-31 MED ORDER — DEXTROSE-NACL 5-0.9 % IV SOLN
INTRAVENOUS | Status: DC
  Administered 2014-08-31: 12:00:00 via INTRAVENOUS

## 2014-08-31 MED ORDER — ACETAMINOPHEN 650 MG RE SUPP: 650.0000 mg | Freq: Four times a day (QID) | RECTAL | Status: DC | PRN

## 2014-08-31 MED ORDER — DEXTROSE-NACL 5-0.9 % IV SOLN
INTRAVENOUS | Status: DC
  Administered 2014-09-01: 10:00:00 via INTRAVENOUS

## 2014-08-31 MED ORDER — ONDANSETRON HCL 4 MG/2ML IJ SOLN: 4.0000 mg | Freq: Four times a day (QID) | INTRAMUSCULAR | Status: DC | PRN

## 2014-08-31 MED ORDER — INSULIN ASPART 100 UNIT/ML ~~LOC~~ SOLN
0.0000 [IU] | Freq: Three times a day (TID) | SUBCUTANEOUS | Status: DC
  Administered 2014-09-01: 2 [IU] via SUBCUTANEOUS
  Administered 2014-09-01: 3 [IU] via SUBCUTANEOUS
  Administered 2014-09-02: 2 [IU] via SUBCUTANEOUS
  Administered 2014-09-02: 1 [IU] via SUBCUTANEOUS
  Administered 2014-09-02: 3 [IU] via SUBCUTANEOUS
  Administered 2014-09-03: 1 [IU] via SUBCUTANEOUS
  Administered 2014-09-06: 2 [IU] via SUBCUTANEOUS

## 2014-08-31 MED ORDER — ONDANSETRON HCL 4 MG PO TABS: 4.0000 mg | ORAL_TABLET | Freq: Four times a day (QID) | ORAL | Status: DC | PRN

## 2014-08-31 MED ORDER — DEXTROSE 50 % IV SOLN
50.0000 mL | Freq: Once | INTRAVENOUS | Status: AC
  Administered 2014-08-31: 50 mL via INTRAVENOUS
  Filled 2014-08-31: qty 50

## 2014-08-31 MED ORDER — AMLODIPINE BESYLATE 5 MG PO TABS
5.0000 mg | ORAL_TABLET | Freq: Every day | ORAL | Status: DC
  Administered 2014-08-31 – 2014-09-08 (×7): 5 mg via ORAL
  Filled 2014-08-31 (×9): qty 1

## 2014-08-31 MED ORDER — DEXTROSE 10 % IV SOLN
INTRAVENOUS | Status: DC
  Administered 2014-08-31: 23:00:00 via INTRAVENOUS

## 2014-08-31 MED ORDER — SODIUM CHLORIDE 0.9 % IV BOLUS (SEPSIS)
500.0000 mL | Freq: Once | INTRAVENOUS | Status: AC
  Administered 2014-08-31: 500 mL via INTRAVENOUS

## 2014-08-31 MED ORDER — SODIUM CHLORIDE 0.9 % IV SOLN: INTRAVENOUS | Status: DC

## 2014-08-31 MED ORDER — DEXTROSE 50 % IV SOLN
25.0000 g | Freq: Once | INTRAVENOUS | Status: AC
  Administered 2014-08-31: 25 g via INTRAVENOUS

## 2014-08-31 MED ORDER — INSULIN ASPART 100 UNIT/ML ~~LOC~~ SOLN: 0.0000 [IU] | Freq: Every day | SUBCUTANEOUS | Status: DC

## 2014-08-31 MED ORDER — HEPARIN SODIUM (PORCINE) 5000 UNIT/ML IJ SOLN
5000.0000 [IU] | Freq: Three times a day (TID) | INTRAMUSCULAR | Status: DC
  Administered 2014-08-31 – 2014-09-09 (×22): 5000 [IU] via SUBCUTANEOUS
  Filled 2014-08-31 (×33): qty 1

## 2014-08-31 MED ORDER — ACETAMINOPHEN 325 MG PO TABS: 650.0000 mg | ORAL_TABLET | Freq: Four times a day (QID) | ORAL | Status: DC | PRN

## 2014-08-31 MED ORDER — SODIUM CHLORIDE 0.9 % IV SOLN
Freq: Once | INTRAVENOUS | Status: AC
  Administered 2014-08-31: 09:00:00 via INTRAVENOUS

## 2014-08-31 MED ORDER — DEXTROSE 50 % IV SOLN
INTRAVENOUS | Status: AC
  Administered 2014-08-31: 50 mL
  Filled 2014-08-31: qty 50

## 2014-08-31 NOTE — H&P (Signed)
[facility] History and Physical  Natalie Wolfe YCX:[id number] DOB: 05-18-1930 DOA: 08/31/2014  Referring physician: er PCP: Estill Dooms, MD   Chief Complaint: falls  HPI: Natalie Wolfe is a 81 y.o. female  Who is from  [employer] [place].  She has been complaining of b/l weakness in her legs and that progressed to all over weakness.  She has fallen after he knees gave out.  Denies LOC and denies hitting head.  She has not been able to eat well for the last month as she had several teeth pulled and has trouble eating.  Yesterday, she only drank coffee.  + dizziness, no fever, no chills, no SOB, no CP  Her Cr was checked on 1/21 and found to be 9- elevated from her baseline of about 2.  Today in the ER, her Cr is 11.  Her blood sugars have also been running low due to the AKI and decreased clearance of he PO Meds.  Foley placed in ER with minimal output.  Given 500 cc in ER    Review of Systems:  All systems reviewed, negative unless stated above    Past Medical History  Diagnosis Date  . Urinary frequency   . Routine general medical examination at a health care facility   . Other atopic dermatitis and related conditions   . Rash and other nonspecific skin eruption   . Other anxiety states   . Insomnia, unspecified   . Chronic kidney disease, stage II (mild)   . Type II or unspecified type diabetes mellitus with renal manifestations, not stated as uncontrolled   . PVD (peripheral vascular disease)   . Unspecified vitamin D deficiency   . Anemia, unspecified   . Obesity, unspecified   . Nocturia   . Pain in joint, lower leg   . Other and unspecified hyperlipidemia   . Unspecified essential hypertension   . Cataract    Past Surgical History  Procedure Laterality Date  . Abdominal hysterectomy  1970  . Breast surgery  1979    nodule removed  . Eye surgery  2004    catarcts removed from right eye.  . Multiple tooth extractions  09/2013    Remonve remaining 3 teeth, no with dentures    Social History:  reports that she quit smoking about 14 years ago. Her smoking use included Cigarettes. She does not have any smokeless tobacco history on file. She reports that she does not drink alcohol or use illicit drugs.  No Known Allergies  Family History  Problem Relation Age of Onset  . Diabetes Mother     Prior to Admission medications   Medication Sig Start Date End Date Taking? Authorizing Provider  acetaminophen (TYLENOL) 325 MG tablet Take 650 mg by mouth daily as needed.    Historical Provider, MD  amLODipine (NORVASC) 5 MG tablet Take 1 tablet by mouth daily for Hypertension 01/02/14   Estill Dooms, MD  BAYER MICROLET LANCETS lancets Use as instructed 11/27/12   Tivis Ringer, RPH-CPP  Blood Glucose Monitoring Suppl (CONTOUR NEXT EZ MONITOR) W/DEVICE KIT 1 Device by Does not apply route daily. 11/27/12   Tivis Ringer, RPH-CPP  cholecalciferol (VITAMIN D) 1000 UNITS tablet Take 1,000 Units by mouth daily. Take 2 capsules once a day for vitamin d supplement    Historical Provider, MD  fluocinonide cream (LIDEX) 0.05 % Apply topically 2 (two) times daily. Apply from neck down twice a day (provided by dermatology).    Historical Provider,  MD  glimepiride (AMARYL) 2 MG tablet Take 1/2 tablet by mouth once daily to control blood sugar 04/24/14   Estill Dooms, MD  glucose blood (BAYER CONTOUR NEXT TEST) test strip Use as instructed 11/27/12   Tivis Ringer, RPH-CPP  hydrocortisone 2.5 % lotion Use to affected area as directed 10/18/12   Historical Provider, MD  solifenacin (VESICARE) 5 MG tablet Take One tablet by mouth once daily to help bladder control 08/20/14   Gayland Curry, DO   Physical Exam: Filed Vitals:   08/31/14 0618 08/31/14 0726 08/31/14 0841 08/31/14 0926  BP: 188/68 159/70 161/73 170/64  Pulse: 81 81 94 85  Temp: 97.4 F (36.3 C)   97.4 F (36.3 C)  TempSrc: Oral   Oral  Resp: 18 21 20 18   Height: 5' 5"  (1.651  m)     Weight: 78.472 kg (173 lb)     SpO2: 100% 100% 100% 100%    Wt Readings from Last 3 Encounters:  08/31/14 78.472 kg (173 lb)  04/03/14 64.411 kg (142 lb)  01/02/14 71.668 kg (158 lb)    General:  Appears calm and comfortable Eyes: PERRL, normal lids, irises & conjunctiva ENT: grossly normal hearing, lips & tongue dry Neck: no LAD, masses or thyromegaly Cardiovascular: RRR, no m/r/g. No LE edema. Respiratory: CTA bilaterally, no w/r/r. Normal respiratory effort. Abdomen: soft, ntnd Skin: no rash or induration seen on limited exam Musculoskeletal: grossly normal tone BUE/BLE Psychiatric: grossly normal mood and affect, speech fluent and appropriate Neurologic: grossly non-focal.          Labs on Admission:  Basic Metabolic Panel:  Recent Labs Lab 08/29/14 0842 08/31/14 0633  NA 134 133*  K 5.9* 4.9  CL 98 104  CO2 12* 15*  GLUCOSE 92 45*  BUN 95* 104*  CREATININE 9.90* 11.75*  CALCIUM 8.8 8.7   Liver Function Tests:  Recent Labs Lab 08/29/14 0842 08/31/14 0633  AST 14 21  ALT 5 10  ALKPHOS 92 75  BILITOT 0.3 0.5  PROT 6.8 6.7  ALBUMIN  --  3.2*   No results for input(s): LIPASE, AMYLASE in the last 168 hours. No results for input(s): AMMONIA in the last 168 hours. CBC:  Recent Labs Lab 08/29/14 0842 08/31/14 0633  WBC 8.5 6.1  NEUTROABS 7.2* 5.6  HGB 9.3* 8.2*  HCT 28.6* 24.8*  MCV 77* 76.1*  PLT 212 179   Cardiac Enzymes: No results for input(s): CKTOTAL, CKMB, CKMBINDEX, TROPONINI in the last 168 hours.  BNP (last 3 results) No results for input(s): PROBNP in the last 8760 hours. CBG:  Recent Labs Lab 08/31/14 0715 08/31/14 0723 08/31/14 0746  GLUCAP 33* 35* 133*    Radiological Exams on Admission: Dg Chest 2 View  08/31/2014   CLINICAL DATA:  Weakness for 2 days  EXAM: CHEST  2 VIEW  COMPARISON:  None.  FINDINGS: Cardiac shadow is within normal limits. The lungs are well aerated bilaterally. Very minimal atelectatic changes  are noted in the posterior costophrenic angle on the lateral projection likely in the posterior right lower lobe.  IMPRESSION: Minimal right lower lobe atelectasis.   Electronically Signed   By: Inez Catalina M.D.   On: 08/31/2014 07:13    EKG: Independently reviewed. sinus  Assessment/Plan Active Problems:   Essential hypertension   Anemia of chronic disease   Acute renal failure   AKI on CKD- insert foley, IVF, strict I/Os, renal U/S, renal consult if Cr does not improve- patient  appears dry, repeat U/A tomm to assess for Hgb- traumatic foley insertion  HTN- IV PRN meds, resume home meds  Anemia of CD- stable  DM- SSI, monitor carefully as AKI- has been hypoglycemic   Code Status: full DVT Prophylaxis: Family Communication:  Disposition Plan:   Time spent: 66 min  Eulogio Bear [facility] Pager [phone]

## 2014-08-31 NOTE — Progress Notes (Signed)
Hypoglycemic Event  CBG: 57  Treatment: 15 GM carbohydrate snack  Symptoms: None  Follow-up CBG: Time:2041 CBG Result:69, 4 oz of Orange juice given , rechecked blood sugar again at 2110 is 84 mg/dl  Possible Reasons for Event: Inadequate meal intake  Comments/MD notified:M Lynch, NP notifed, asked provider whether we can change IV fluid until BS is stable.     Natalie Wolfe, Natalie Wolfe  Remember to initiate Hypoglycemia Order Set & complete

## 2014-08-31 NOTE — ED Notes (Signed)
Pt given coke to drink. Refused to eat.

## 2014-08-31 NOTE — ED Provider Notes (Signed)
Pt seen with PA Montez Moritaarter.  Pt sent from nursing home with complaint of weakness.  Labs done on 1/21 with new renal failure, Cr of 9.  Worsening anemia on labs today.  No EKG changes.  Will need admit.   Date: 08/31/2014  Rate: 77  Rhythm: normal sinus rhythm  QRS Axis: right  Intervals: normal  ST/T Wave abnormalities: nonspecific T wave changes  Conduction Disutrbances:none  Narrative Interpretation:   Old EKG Reviewed: none available    Olivia Mackielga M Reem Fleury, MD 08/31/14 [phone]

## 2014-08-31 NOTE — Discharge Summary (Signed)
[facility] History and Physical  Natalie Wolfe:[id number] DOB: October 08, 1929 DOA: 08/31/2014  Referring physician: er PCP: Estill Dooms, MD   Chief Complaint: falls  HPI: Natalie Wolfe is a 81 y.o. female  Who is from  [place] [employer] [place].  She has been complaining of b/l weakness in her legs and that progressed to all over weakness.  She has fallen after he knees gave out.  Denies LOC and denies hitting head.  She has not been able to eat well for the last month as she had several teeth pulled and has trouble eating.  Yesterday, she only drank coffee.  + dizziness, no fever, no chills, no SOB, no CP  Her Cr was checked on 1/21 and found to be 9- elevated from her baseline of about 2.  Today in the ER, her Cr is 11.  Her blood sugars have also been running low due to the AKI and decreased clearance of he PO Meds.  Foley placed in ER with minimal output.  Given 500 cc in ER    Review of Systems:  All systems reviewed, negative unless stated above    Past Medical History  Diagnosis Date  . Urinary frequency   . Routine general medical examination at a health care facility   . Other atopic dermatitis and related conditions   . Rash and other nonspecific skin eruption   . Other anxiety states   . Insomnia, unspecified   . Chronic kidney disease, stage II (mild)   . Type II or unspecified type diabetes mellitus with renal manifestations, not stated as uncontrolled   . PVD (peripheral vascular disease)   . Unspecified vitamin D deficiency   . Anemia, unspecified   . Obesity, unspecified   . Nocturia   . Pain in joint, lower leg   . Other and unspecified hyperlipidemia   . Unspecified essential hypertension   . Cataract    Past Surgical History  Procedure Laterality Date  . Abdominal hysterectomy  1970  . Breast surgery  1979    nodule removed  . Eye surgery  2004    catarcts removed from right eye.  . Multiple tooth extractions  09/2013    Remonve remaining 3 teeth, no with dentures    Social History:  reports that she quit smoking about 14 years ago. Her smoking use included Cigarettes. She does not have any smokeless tobacco history on file. She reports that she does not drink alcohol or use illicit drugs.  No Known Allergies  Family History  Problem Relation Age of Onset  . Diabetes Mother     Prior to Admission medications   Medication Sig Start Date End Date Taking? Authorizing Provider  acetaminophen (TYLENOL) 325 MG tablet Take 650 mg by mouth daily as needed.    Historical Provider, MD  amLODipine (NORVASC) 5 MG tablet Take 1 tablet by mouth daily for Hypertension 01/02/14   Estill Dooms, MD  BAYER MICROLET LANCETS lancets Use as instructed 11/27/12   Tivis Ringer, RPH-CPP  Blood Glucose Monitoring Suppl (CONTOUR NEXT EZ MONITOR) W/DEVICE KIT 1 Device by Does not apply route daily. 11/27/12   Tivis Ringer, RPH-CPP  cholecalciferol (VITAMIN D) 1000 UNITS tablet Take 1,000 Units by mouth daily. Take 2 capsules once a day for vitamin d supplement    Historical Provider, MD  fluocinonide cream (LIDEX) 0.05 % Apply topically 2 (two) times daily. Apply from neck down twice a day (provided by dermatology).    Historical Provider,  MD  glimepiride (AMARYL) 2 MG tablet Take 1/2 tablet by mouth once daily to control blood sugar 04/24/14   Estill Dooms, MD  glucose blood (BAYER CONTOUR NEXT TEST) test strip Use as instructed 11/27/12   Tivis Ringer, RPH-CPP  hydrocortisone 2.5 % lotion Use to affected area as directed 10/18/12   Historical Provider, MD  solifenacin (VESICARE) 5 MG tablet Take One tablet by mouth once daily to help bladder control 08/20/14   Gayland Curry, DO   Physical Exam: Filed Vitals:   08/31/14 0618 08/31/14 0726 08/31/14 0841 08/31/14 0926  BP: 188/68 159/70 161/73 170/64  Pulse: 81 81 94 85  Temp: 97.4 F (36.3 C)   97.4 F (36.3 C)  TempSrc: Oral   Oral  Resp: 18 21 20 18   Height: 5' 5"  (1.651  m)     Weight: 78.472 kg (173 lb)     SpO2: 100% 100% 100% 100%    Wt Readings from Last 3 Encounters:  08/31/14 78.472 kg (173 lb)  04/03/14 64.411 kg (142 lb)  01/02/14 71.668 kg (158 lb)    General:  Appears calm and comfortable Eyes: PERRL, normal lids, irises & conjunctiva ENT: grossly normal hearing, lips & tongue dry Neck: no LAD, masses or thyromegaly Cardiovascular: RRR, no m/r/g. No LE edema. Respiratory: CTA bilaterally, no w/r/r. Normal respiratory effort. Abdomen: soft, ntnd Skin: no rash or induration seen on limited exam Musculoskeletal: grossly normal tone BUE/BLE Psychiatric: grossly normal mood and affect, speech fluent and appropriate Neurologic: grossly non-focal.          Labs on Admission:  Basic Metabolic Panel:  Recent Labs Lab 08/29/14 0842 08/31/14 0633  NA 134 133*  K 5.9* 4.9  CL 98 104  CO2 12* 15*  GLUCOSE 92 45*  BUN 95* 104*  CREATININE 9.90* 11.75*  CALCIUM 8.8 8.7   Liver Function Tests:  Recent Labs Lab 08/29/14 0842 08/31/14 0633  AST 14 21  ALT 5 10  ALKPHOS 92 75  BILITOT 0.3 0.5  PROT 6.8 6.7  ALBUMIN  --  3.2*   No results for input(s): LIPASE, AMYLASE in the last 168 hours. No results for input(s): AMMONIA in the last 168 hours. CBC:  Recent Labs Lab 08/29/14 0842 08/31/14 0633  WBC 8.5 6.1  NEUTROABS 7.2* 5.6  HGB 9.3* 8.2*  HCT 28.6* 24.8*  MCV 77* 76.1*  PLT 212 179   Cardiac Enzymes: No results for input(s): CKTOTAL, CKMB, CKMBINDEX, TROPONINI in the last 168 hours.  BNP (last 3 results) No results for input(s): PROBNP in the last 8760 hours. CBG:  Recent Labs Lab 08/31/14 0715 08/31/14 0723 08/31/14 0746  GLUCAP 33* 35* 133*    Radiological Exams on Admission: Dg Chest 2 View  08/31/2014   CLINICAL DATA:  Weakness for 2 days  EXAM: CHEST  2 VIEW  COMPARISON:  None.  FINDINGS: Cardiac shadow is within normal limits. The lungs are well aerated bilaterally. Very minimal atelectatic changes  are noted in the posterior costophrenic angle on the lateral projection likely in the posterior right lower lobe.  IMPRESSION: Minimal right lower lobe atelectasis.   Electronically Signed   By: Inez Catalina M.D.   On: 08/31/2014 07:13    EKG: Independently reviewed. sinus  Assessment/Plan Active Problems:   Essential hypertension   Anemia of chronic disease   Acute renal failure   AKI on CKD- insert foley, IVF, strict I/Os, renal U/S, renal consult if Cr does not improve- patient  appears dry, repeat U/A tomm to assess for Hgb- traumatic foley insertion  HTN- IV PRN meds, resume home meds  Anemia of CD- stable  DM- SSI, monitor carefully as AKI- has been hypoglycemic   Code Status: full DVT Prophylaxis: Family Communication:  Disposition Plan:   Time spent: 59 min  Eulogio Bear [facility] Pager [phone]

## 2014-08-31 NOTE — Progress Notes (Signed)
Hypoglycemic Event  CBG: 25  Treatment: 50% Dextrose inj Symptoms: diaphoresis, lethargy Follow-up CBG: Time:1750 CBG Result:149  Possible Reasons for Event: refusing /decrease oral intake  Comments/MD notified:yes. Orders received.    Cerrone Debold [place]ule  Remember to initiate Hypoglycemia Order Set & complete

## 2014-08-31 NOTE — ED Notes (Signed)
Per EMS: pt is from [facility]. Pt reports generalized weakness, mostly in bilat legs, since yesterday morning. EMS reports that the pt has fallen twice today. Pt with bruising to bilat knees. Pt states that her knees gave out. Pt denies hitting her head and LOC. Pt was able to bear weight and ambulate with EMS. Pt also reports a loss in appetite. EMS CBG 91.

## 2014-08-31 NOTE — ED Provider Notes (Signed)
CSN: [id number]     Arrival date & time 08/31/14  5615 History   First MD Initiated Contact with Patient 08/31/14 [phone]     Chief Complaint  Patient presents with  . Weakness     (Consider location/radiation/quality/duration/timing/severity/associated sxs/prior Treatment) HPI Natalie Wolfe is a 81 y.o. female who comes in for evaluation of weakness patient states that yesterday morning her whole body has "just felt weak". She reports her appetite has also been decreased. Yesterday morning at approximately 5 AM patient reports having had coffee, but nothing else to eat or drink the rest of the day, nothing today. She reports this morning she got up out of bed to go to the restroom and as soon as she stood up, she felt dizzy and fell forward onto her knees. She denies loss of consciousness, head trauma. There is nothing that makes her symptoms better or worse. She denies any headache, changes in vision, chest pain, cough, shortness of breath, abdominal pain, nausea or vomiting, diarrhea or constipation, dysuria or hematuria, numbness, syncope. She does report that someone called her yesterday about lab work that she needed to follow-up on.  Past Medical History  Diagnosis Date  . Urinary frequency   . Routine general medical examination at a health care facility   . Other atopic dermatitis and related conditions   . Rash and other nonspecific skin eruption   . Other anxiety states   . Insomnia, unspecified   . Chronic kidney disease, stage II (mild)   . Type II or unspecified type diabetes mellitus with renal manifestations, not stated as uncontrolled   . PVD (peripheral vascular disease)   . Unspecified vitamin D deficiency   . Anemia, unspecified   . Obesity, unspecified   . Nocturia   . Pain in joint, lower leg   . Other and unspecified hyperlipidemia   . Unspecified essential hypertension   . Cataract    Past Surgical History  Procedure Laterality Date  . Abdominal  hysterectomy  1970  . Breast surgery  1979    nodule removed  . Eye surgery  2004    catarcts removed from right eye.  . Multiple tooth extractions  09/2013    Remonve remaining 3 teeth, no with dentures    Family History  Problem Relation Age of Onset  . Diabetes Mother    History  Substance Use Topics  . Smoking status: Former Smoker    Types: Cigarettes    Quit date: 08/10/2000  . Smokeless tobacco: Not on file  . Alcohol Use: No   OB History    No data available     Review of Systems  All other systems reviewed and are negative.  A 10 point review of systems was completed and was negative except for pertinent positives and negatives as mentioned in the history of present illness     Allergies  Review of patient's allergies indicates no known allergies.  Home Medications   Prior to Admission medications   Medication Sig Start Date End Date Taking? Authorizing Provider  acetaminophen (TYLENOL) 325 MG tablet Take 650 mg by mouth daily as needed.    Historical Provider, MD  amLODipine (NORVASC) 5 MG tablet Take 1 tablet by mouth daily for Hypertension 01/02/14   Estill Dooms, MD  BAYER MICROLET LANCETS lancets Use as instructed 11/27/12   Tivis Ringer, RPH-CPP  Blood Glucose Monitoring Suppl (CONTOUR NEXT EZ MONITOR) W/DEVICE KIT 1 Device by Does not apply route daily. 11/27/12  Tivis Ringer, RPH-CPP  cholecalciferol (VITAMIN D) 1000 UNITS tablet Take 1,000 Units by mouth daily. Take 2 capsules once a day for vitamin d supplement    Historical Provider, MD  fluocinonide cream (LIDEX) 0.05 % Apply topically 2 (two) times daily. Apply from neck down twice a day (provided by dermatology).    Historical Provider, MD  glimepiride (AMARYL) 2 MG tablet Take 1/2 tablet by mouth once daily to control blood sugar 04/24/14   Estill Dooms, MD  glucose blood (BAYER CONTOUR NEXT TEST) test strip Use as instructed 11/27/12   Tivis Ringer, RPH-CPP  hydrocortisone 2.5 % lotion Use to  affected area as directed 10/18/12   Historical Provider, MD  solifenacin (VESICARE) 5 MG tablet Take One tablet by mouth once daily to help bladder control 08/20/14   Tiffany L Reed, DO   BP 188/68 mmHg  Pulse 81  Temp(Src) 97.4 F (36.3 C) (Oral)  Resp 18  Ht 5' 5"  (1.651 m)  Wt 173 lb (78.472 kg)  BMI 28.79 kg/m2  SpO2 100% Physical Exam  Constitutional: She is oriented to person, place, and time. She appears well-developed and well-nourished.  HENT:  Head: Normocephalic and atraumatic.  Mouth/Throat: Oropharynx is clear and moist.  Dry mucous membranes.  Eyes: Conjunctivae are normal. Pupils are equal, round, and reactive to light. Right eye exhibits no discharge. Left eye exhibits no discharge. No scleral icterus.  Neck: Normal range of motion. Neck supple. No JVD present.  Cardiovascular: Normal rate, regular rhythm and normal heart sounds.   Pulmonary/Chest: Effort normal and breath sounds normal. No respiratory distress. She has no wheezes. She has no rales.  Abdominal: Soft. She exhibits no distension and no mass. There is no tenderness. There is no rebound and no guarding.  Genitourinary: Guaiac negative stool.  Chaperone present for entirety of rectal exam. No frank bleeding on exam. No active hemorrhoids noted. Soft brown stool in rectal vault and on exam glove.  Musculoskeletal: Normal range of motion. She exhibits no tenderness.  Small abrasion with mild ecchymosis to bilateral knees. Maintains full active range of motion. No obvious deformities or other lesions noted.  Neurological: She is alert and oriented to person, place, and time.  Cranial Nerves II-XII grossly intact. Motor and sensation 5/5 in all 4 extremities. Grip strength intact and equal bilaterally.  Skin: Skin is warm and dry. No rash noted.  Psychiatric: She has a normal mood and affect. Her behavior is normal.  Nursing note and vitals reviewed.   ED Course  Procedures (including critical care  time) Labs Review Labs Reviewed  CBC WITH DIFFERENTIAL/PLATELET - Abnormal; Notable for the following:    RBC 3.26 (*)    Hemoglobin 8.2 (*)    HCT 24.8 (*)    MCV 76.1 (*)    MCH 25.2 (*)    RDW 17.3 (*)    Neutrophils Relative % 92 (*)    Lymphocytes Relative 4 (*)    Lymphs Abs 0.2 (*)    All other components within normal limits  COMPREHENSIVE METABOLIC PANEL - Abnormal; Notable for the following:    Sodium 133 (*)    CO2 15 (*)    Glucose, Bld 45 (*)    BUN 104 (*)    Creatinine, Ser 11.75 (*)    Albumin 3.2 (*)    GFR calc non Af Amer 3 (*)    GFR calc Af Amer 3 (*)    All other components within normal limits  CBG MONITORING,  ED - Abnormal; Notable for the following:    Glucose-Capillary 33 (*)    All other components within normal limits  CBG MONITORING, ED - Abnormal; Notable for the following:    Glucose-Capillary 35 (*)    All other components within normal limits  CBG MONITORING, ED - Abnormal; Notable for the following:    Glucose-Capillary 133 (*)    All other components within normal limits  URINALYSIS, ROUTINE W REFLEX MICROSCOPIC  I-STAT TROPOININ, ED  POC OCCULT BLOOD, ED    Imaging Review Dg Chest 2 View  08/31/2014   CLINICAL DATA:  Weakness for 2 days  EXAM: CHEST  2 VIEW  COMPARISON:  None.  FINDINGS: Cardiac shadow is within normal limits. The lungs are well aerated bilaterally. Very minimal atelectatic changes are noted in the posterior costophrenic angle on the lateral projection likely in the posterior right lower lobe.  IMPRESSION: Minimal right lower lobe atelectasis.   Electronically Signed   By: Inez Catalina M.D.   On: 08/31/2014 07:13     EKG Interpretation None        Date: 08/31/2014  Rate: 77  Rhythm: normal sinus rhythm  QRS Axis: right  Intervals: normal  ST/T Wave abnormalities: nonspecific T wave changes  Conduction Disutrbances:none  Narrative Interpretation:   Old EKG Reviewed: none available   Meds given in  ED:  Medications  0.9 %  sodium chloride infusion (not administered)  dextrose 50 % solution 50 mL (50 mLs Intravenous Given 08/31/14 0727)  sodium chloride 0.9 % bolus 500 mL (500 mLs Intravenous New Bag/Given 08/31/14 0751)    New Prescriptions   No medications on file   Filed Vitals:   08/31/14 0618  BP: 188/68  Pulse: 81  Temp: 97.4 F (36.3 C)  TempSrc: Oral  Resp: 18  Height: 5' 5"  (1.651 m)  Weight: 173 lb (78.472 kg)  SpO2: 100%    MDM  Pt presents with acute on chronic renal failure. No nephrotoxic medications identified.   Vitals stable - WNL -afebrile Pt resting comfortably in ED. Feels better after CBG correction. Received 565m NS bolus, 1282mhr PE--No abd discomfort, no distention, normal neuro exam. Pt is alert and oriented to person, place, time and situation. Labwork--Cr. 11.75, BUN 104, K 4.9, Hgb 8.2, hemoccult neg. EKG not concerning. Pending UA Imaging--Bladder scan at bedside [id number] shows minimal R lower lobe atelectasis.  Discussed pt presentation and ED course with Dr. OttSharol Givenecision made to have pt admitted for worsening renal failure.   Pt admitted, Dr. VanEliseo SquiresFinal diagnoses:  AKI (acute kidney injury)        BenVerl DickerA-C 08/31/14 0830  OlgKalman DrapeD 09/01/14 [phone]

## 2014-08-31 NOTE — Significant Event (Signed)
Rapid Response Event Note  Overview: Time Called: 1119 Arrival Time: 1124 Event Type: Neurologic  Initial Focused Assessment:  Called by primary RN for patient unresponsive.  Upon my arrival to patients room, Rn at bedside.  Patient lyin in bed, unresponsive to painful stimuli.  BP 170/64, 85,18, 100% on RA.  CBG checked result 30,   Interventions:  1 amp D50 given, patient then started to move around in bed and started to moan.  MD updated and orders received.     Event Summary:  RN to call if assistance needed   at      at          [facility], Maryagnes Amosenise Ann

## 2014-08-31 NOTE — Progress Notes (Addendum)
Hypoglycemic Event  CBG: 30  Treatment: Destrose 50%, 25 gm  Symptoms: unresponsiveness, diaphoresis Follow-up CBG: Time: 1142 CBG Result:131  Possible Reasons for Event: decreased oral intake  Comments/MD notified: yes. D5NS started at 75 cc/hr    Natalie Wolfe  Remember to initiate Hypoglycemia Order Set & complete

## 2014-09-01 ENCOUNTER — Inpatient Hospital Stay (HOSPITAL_COMMUNITY): Payer: Medicare Other

## 2014-09-01 DIAGNOSIS — R062 Wheezing: Secondary | ICD-10-CM

## 2014-09-01 DIAGNOSIS — E875 Hyperkalemia: Secondary | ICD-10-CM

## 2014-09-01 LAB — BASIC METABOLIC PANEL
Anion gap: 12 (ref 5–15)
Anion gap: 14 (ref 5–15)
BUN: 98 mg/dL — ABNORMAL HIGH (ref 6–23)
BUN: 96 mg/dL — ABNORMAL HIGH (ref 6–23)
CHLORIDE: 101 mmol/L (ref 96–112)
CO2: 16 mmol/L — ABNORMAL LOW (ref 19–32)
CO2: 17 mmol/L — ABNORMAL LOW (ref 19–32)
CREATININE: 10.34 mg/dL — AB (ref 0.50–1.10)
Calcium: 8.4 mg/dL (ref 8.4–10.5)
Calcium: 8.2 mg/dL — ABNORMAL LOW (ref 8.4–10.5)
Chloride: 105 mmol/L (ref 96–112)
Creatinine, Ser: 9.81 mg/dL — ABNORMAL HIGH (ref 0.50–1.10)
GFR calc non Af Amer: 3 mL/min — ABNORMAL LOW (ref >90)
GFR calc non Af Amer: 3 mL/min — ABNORMAL LOW (ref >90)
GFR, EST AFRICAN AMERICAN: 4 mL/min — AB (ref >90)
GFR, EST AFRICAN AMERICAN: 4 mL/min — AB (ref >90)
GLUCOSE: 134 mg/dL — AB (ref 70–99)
GLUCOSE: 331 mg/dL — AB (ref 70–99)
POTASSIUM: 5.8 mmol/L — AB (ref 3.5–5.1)
Potassium: 6 mmol/L — ABNORMAL HIGH (ref 3.5–5.1)
SODIUM: 133 mmol/L — AB (ref 135–145)
Sodium: 132 mmol/L — ABNORMAL LOW (ref 135–145)

## 2014-09-01 LAB — GLUCOSE, CAPILLARY
GLUCOSE-CAPILLARY: 167 mg/dL — AB (ref 70–99)
GLUCOSE-CAPILLARY: 382 mg/dL — AB (ref 70–99)
GLUCOSE-CAPILLARY: 90 mg/dL (ref 70–99)
Glucose-Capillary: 63 mg/dL — ABNORMAL LOW (ref 70–99)
Glucose-Capillary: 53 mg/dL — ABNORMAL LOW (ref 70–99)
Glucose-Capillary: 84 mg/dL (ref 70–99)
Glucose-Capillary: 97 mg/dL (ref 70–99)
Glucose-Capillary: 232 mg/dL — ABNORMAL HIGH (ref 70–99)

## 2014-09-01 LAB — CBC
HCT: 24.5 % — ABNORMAL LOW (ref 36.0–46.0)
Hemoglobin: 8 g/dL — ABNORMAL LOW (ref 12.0–15.0)
MCH: 25.1 pg — ABNORMAL LOW (ref 26.0–34.0)
MCHC: 32.7 g/dL (ref 30.0–36.0)
MCV: 76.8 fL — AB (ref 78.0–100.0)
Platelets: 197 10*3/uL (ref 150–400)
RBC: 3.19 MIL/uL — ABNORMAL LOW (ref 3.87–5.11)
RDW: 18 % — ABNORMAL HIGH (ref 11.5–15.5)
WBC: 7.5 10*3/uL (ref 4.0–10.5)

## 2014-09-01 LAB — MRSA PCR SCREENING: MRSA by PCR: NEGATIVE

## 2014-09-01 LAB — VITAMIN B12: VITAMIN B 12: 320 pg/mL (ref 211–911)

## 2014-09-01 LAB — IRON AND TIBC
IRON: 54 ug/dL (ref 42–145)
Saturation Ratios: 30 % (ref 20–55)
TIBC: 180 ug/dL — AB (ref 250–470)
UIBC: 126 ug/dL (ref 125–400)

## 2014-09-01 LAB — STREP PNEUMONIAE URINARY ANTIGEN: STREP PNEUMO URINARY ANTIGEN: NEGATIVE

## 2014-09-01 LAB — COMPREHENSIVE METABOLIC PANEL
ALT: 12 U/L (ref 0–35)
ANION GAP: 13 (ref 5–15)
AST: 28 U/L (ref 0–37)
Albumin: 3 g/dL — ABNORMAL LOW (ref 3.5–5.2)
Alkaline Phosphatase: 78 U/L (ref 39–117)
BUN: 98 mg/dL — ABNORMAL HIGH (ref 6–23)
CALCIUM: 8.4 mg/dL (ref 8.4–10.5)
CO2: 17 mmol/L — ABNORMAL LOW (ref 19–32)
CREATININE: 10.48 mg/dL — AB (ref 0.50–1.10)
Chloride: 105 mmol/L (ref 96–112)
GFR calc non Af Amer: 3 mL/min — ABNORMAL LOW (ref >90)
GFR, EST AFRICAN AMERICAN: 3 mL/min — AB (ref >90)
GLUCOSE: 72 mg/dL (ref 70–99)
Potassium: 5.7 mmol/L — ABNORMAL HIGH (ref 3.5–5.1)
SODIUM: 135 mmol/L (ref 135–145)
Total Bilirubin: 0.3 mg/dL (ref 0.3–1.2)
Total Protein: 6.2 g/dL (ref 6.0–8.3)

## 2014-09-01 LAB — CK TOTAL AND CKMB (NOT AT ARMC)
CK, MB: 6.4 ng/mL — AB (ref 0.3–4.0)
RELATIVE INDEX: 4.1 — AB (ref 0.0–2.5)
Total CK: 155 U/L (ref 7–177)

## 2014-09-01 LAB — CLOSTRIDIUM DIFFICILE BY PCR: CDIFFPCR: NEGATIVE

## 2014-09-01 LAB — OCCULT BLOOD X 1 CARD TO LAB, STOOL: FECAL OCCULT BLD: POSITIVE — AB

## 2014-09-01 LAB — PROTIME-INR
INR: 1.01 (ref 0.00–1.49)
Prothrombin Time: 13.4 seconds (ref 11.6–15.2)

## 2014-09-01 LAB — ABO/RH: ABO/RH(D): B POS

## 2014-09-01 MED ORDER — METRONIDAZOLE IN NACL 5-0.79 MG/ML-% IV SOLN
500.0000 mg | Freq: Three times a day (TID) | INTRAVENOUS | Status: DC
  Administered 2014-09-01 (×2): 500 mg via INTRAVENOUS
  Filled 2014-09-01 (×3): qty 100

## 2014-09-01 MED ORDER — GLUCOSE 40 % PO GEL
1.0000 | Freq: Once | ORAL | Status: AC
  Administered 2014-09-01: 37.5 g via ORAL

## 2014-09-01 MED ORDER — IPRATROPIUM-ALBUTEROL 0.5-2.5 (3) MG/3ML IN SOLN
3.0000 mL | RESPIRATORY_TRACT | Status: DC
  Administered 2014-09-01 (×2): 3 mL via RESPIRATORY_TRACT
  Filled 2014-09-01: qty 3

## 2014-09-01 MED ORDER — SODIUM POLYSTYRENE SULFONATE 15 GM/60ML PO SUSP
30.0000 g | Freq: Once | ORAL | Status: AC
  Administered 2014-09-01: 30 g via ORAL
  Filled 2014-09-01: qty 120

## 2014-09-01 MED ORDER — SODIUM BICARBONATE 650 MG PO TABS
650.0000 mg | ORAL_TABLET | Freq: Three times a day (TID) | ORAL | Status: DC
  Administered 2014-09-01 (×2): 650 mg via ORAL
  Filled 2014-09-01 (×3): qty 1

## 2014-09-01 MED ORDER — INSULIN ASPART 100 UNIT/ML IV SOLN
10.0000 [IU] | Freq: Once | INTRAVENOUS | Status: AC
  Administered 2014-09-01: 10 [IU] via INTRAVENOUS

## 2014-09-01 MED ORDER — SODIUM BICARBONATE 8.4 % IV SOLN
50.0000 meq | Freq: Once | INTRAVENOUS | Status: AC
  Administered 2014-09-01: 50 meq via INTRAVENOUS
  Filled 2014-09-01: qty 50

## 2014-09-01 MED ORDER — FUROSEMIDE 10 MG/ML IJ SOLN
160.0000 mg | Freq: Four times a day (QID) | INTRAMUSCULAR | Status: DC
  Administered 2014-09-02 (×2): 160 mg via INTRAVENOUS
  Filled 2014-09-01 (×4): qty 16

## 2014-09-01 MED ORDER — GLUCOSE 40 % PO GEL
ORAL | Status: AC
  Filled 2014-09-01: qty 1

## 2014-09-01 MED ORDER — FUROSEMIDE 10 MG/ML IJ SOLN
Freq: Once | INTRAMUSCULAR | Status: AC
  Administered 2014-09-01: 22:00:00 via INTRAVENOUS
  Filled 2014-09-01: qty 50

## 2014-09-01 MED ORDER — IPRATROPIUM-ALBUTEROL 0.5-2.5 (3) MG/3ML IN SOLN
3.0000 mL | RESPIRATORY_TRACT | Status: DC
  Administered 2014-09-01: 3 mL via RESPIRATORY_TRACT
  Filled 2014-09-01: qty 3

## 2014-09-01 MED ORDER — IPRATROPIUM-ALBUTEROL 0.5-2.5 (3) MG/3ML IN SOLN
3.0000 mL | RESPIRATORY_TRACT | Status: DC
  Administered 2014-09-01 (×2): 3 mL via RESPIRATORY_TRACT
  Filled 2014-09-01 (×3): qty 3

## 2014-09-01 MED ORDER — SODIUM CHLORIDE 0.9 % IV SOLN
1.0000 g | Freq: Once | INTRAVENOUS | Status: AC
  Administered 2014-09-01: 1 g via INTRAVENOUS
  Filled 2014-09-01: qty 10

## 2014-09-01 MED ORDER — DEXTROSE 50 % IV SOLN
INTRAVENOUS | Status: AC
Start: 2014-09-01 — End: 2014-09-01
  Filled 2014-09-01: qty 50

## 2014-09-01 MED ORDER — METOPROLOL TARTRATE 1 MG/ML IV SOLN
2.5000 mg | Freq: Once | INTRAVENOUS | Status: AC
  Administered 2014-09-01: 2.5 mg via INTRAVENOUS
  Filled 2014-09-01: qty 5

## 2014-09-01 MED ORDER — FUROSEMIDE 10 MG/ML IJ SOLN
80.0000 mg | Freq: Once | INTRAMUSCULAR | Status: DC
  Filled 2014-09-01: qty 8

## 2014-09-01 MED ORDER — DEXTROSE 5 % IV SOLN
500.0000 mg | INTRAVENOUS | Status: DC
  Administered 2014-09-01: 500 mg via INTRAVENOUS
  Filled 2014-09-01: qty 500

## 2014-09-01 MED ORDER — DEXTROSE 50 % IV SOLN
25.0000 mL | Freq: Once | INTRAVENOUS | Status: AC
  Administered 2014-09-01: 25 mL via INTRAVENOUS

## 2014-09-01 MED ORDER — FUROSEMIDE 10 MG/ML IJ SOLN
80.0000 mg | Freq: Once | INTRAMUSCULAR | Status: AC
  Administered 2014-09-01: 80 mg via INTRAVENOUS
  Filled 2014-09-01: qty 8

## 2014-09-01 MED ORDER — CEFTRIAXONE SODIUM IN DEXTROSE 20 MG/ML IV SOLN
1.0000 g | INTRAVENOUS | Status: DC
  Administered 2014-09-01: 1 g via INTRAVENOUS
  Filled 2014-09-01: qty 50

## 2014-09-01 MED ORDER — IPRATROPIUM-ALBUTEROL 0.5-2.5 (3) MG/3ML IN SOLN
3.0000 mL | Freq: Four times a day (QID) | RESPIRATORY_TRACT | Status: DC
  Administered 2014-09-02: 3 mL via RESPIRATORY_TRACT
  Filled 2014-09-01: qty 3

## 2014-09-01 MED ORDER — METHYLPREDNISOLONE SODIUM SUCC 125 MG IJ SOLR
60.0000 mg | Freq: Four times a day (QID) | INTRAMUSCULAR | Status: DC
  Administered 2014-09-01 (×2): 60 mg via INTRAVENOUS
  Filled 2014-09-01 (×2): qty 2

## 2014-09-01 MED ORDER — SODIUM CHLORIDE 0.9 % IV BOLUS (SEPSIS)
1000.0000 mL | Freq: Once | INTRAVENOUS | Status: AC
  Administered 2014-09-01: 1000 mL via INTRAVENOUS

## 2014-09-01 MED ORDER — SODIUM CHLORIDE 0.9 % IV SOLN: INTRAVENOUS | Status: DC

## 2014-09-01 NOTE — Consult Note (Addendum)
Renal Service Consult Note [facility]  Natalie Wolfe 09/01/2014 Roney Jaffe D Requesting Physician:  Dr Erlinda Hong  Reason for Consult:  Acute on CKD HPI: The patient is a 81 y.o. year-old with hx of DM2 (x 50 years, never took insulin, only oral meds), HTN and CKD stage 4 with baseline creatinine of 2.0 in Aug 2015 (eGFR 22 mL/min).  She went to see her PCP on 1/22 and labs showed serum Cr 9.9 so she was sent to ED. Her complaints were gen'd weakness with fall x 1.  Appetite has been "rotten" for almost a week. No N/V or diarrhea, no abd pain. No SOB or CP at home.  She was admitted and started on IVF's. No intake of NSAID's, no ACEi or ARB on med list.  Takes norvasc for BP.    Overnight she became SOB and currently is very dyspneic, sitting up straight in bed.  She wasn't like this at home per pt.  Denies hematuria, no difficulty voiding, no abd pain. No jt pain, skin rash, no HA.  No blurred vision.  Lives alone, husband and mother died.  No active tobacco or etoh.  Was born in [place] and grew up in [place].    Past Medical History  Past Medical History  Diagnosis Date  . Urinary frequency   . Routine general medical examination at a health care facility   . Other atopic dermatitis and related conditions   . Rash and other nonspecific skin eruption   . Other anxiety states   . Insomnia, unspecified   . Chronic kidney disease, stage II (mild)   . Type II or unspecified type diabetes mellitus with renal manifestations, not stated as uncontrolled   . PVD (peripheral vascular disease)   . Unspecified vitamin D deficiency   . Anemia, unspecified   . Obesity, unspecified   . Nocturia   . Pain in joint, lower leg   . Other and unspecified hyperlipidemia   . Unspecified essential hypertension   . Cataract    Past Surgical History  Past Surgical History  Procedure Laterality Date  . Abdominal hysterectomy  1970  . Breast surgery  1979    nodule removed  . Eye surgery   2004    catarcts removed from right eye.  . Multiple tooth extractions  09/2013    Remonve remaining 3 teeth, no with dentures    Family History  Family History  Problem Relation Age of Onset  . Diabetes Mother    Social History  reports that she quit smoking about 14 years ago. Her smoking use included Cigarettes. She does not have any smokeless tobacco history on file. She reports that she does not drink alcohol or use illicit drugs. Allergies No Known Allergies Home medications Prior to Admission medications   Medication Sig Start Date End Date Taking? Authorizing Provider  acetaminophen (TYLENOL) 325 MG tablet Take 650 mg by mouth daily as needed.    Historical Provider, MD  amLODipine (NORVASC) 5 MG tablet Take 1 tablet by mouth daily for Hypertension 01/02/14   Estill Dooms, MD  BAYER MICROLET LANCETS lancets Use as instructed 11/27/12   Tivis Ringer, RPH-CPP  Blood Glucose Monitoring Suppl (CONTOUR NEXT EZ MONITOR) W/DEVICE KIT 1 Device by Does not apply route daily. 11/27/12   Tivis Ringer, RPH-CPP  cholecalciferol (VITAMIN D) 1000 UNITS tablet Take 1,000 Units by mouth daily. Take 2 capsules once a day for vitamin d supplement    Historical Provider, MD  fluocinonide  cream (LIDEX) 0.05 % Apply topically 2 (two) times daily. Apply from neck down twice a day (provided by dermatology).    Historical Provider, MD  glimepiride (AMARYL) 2 MG tablet Take 1/2 tablet by mouth once daily to control blood sugar 04/24/14   Estill Dooms, MD  glucose blood (BAYER CONTOUR NEXT TEST) test strip Use as instructed 11/27/12   Tivis Ringer, RPH-CPP  hydrocortisone 2.5 % lotion Use to affected area as directed 10/18/12   Historical Provider, MD  solifenacin (VESICARE) 5 MG tablet Take One tablet by mouth once daily to help bladder control 08/20/14   Gayland Curry, DO   Liver Function Tests  Recent Labs Lab 08/29/14 [phone] 08/31/14 0633 09/01/14 0442  AST 14 21 28   ALT 5 10 12   ALKPHOS 92 75 78   BILITOT 0.3 0.5 0.3  PROT 6.8 6.7 6.2  ALBUMIN  --  3.2* 3.0*   No results for input(s): LIPASE, AMYLASE in the last 168 hours. CBC  Recent Labs Lab 08/29/14 0842 08/31/14 0633 09/01/14 0442  WBC 8.5 6.1 7.5  NEUTROABS 7.2* 5.6  --   HGB 9.3* 8.2* 8.0*  HCT 28.6* 24.8* 24.5*  MCV 77* 76.1* 76.8*  PLT 212 179 680   Basic Metabolic Panel  Recent Labs Lab 08/29/14 0842 08/31/14 0633 09/01/14 0442 09/01/14 0957 09/01/14 1651  NA 134 133* 135 133* 132*  K 5.9* 4.9 5.7* 5.8* 6.0*  CL 98 104 105 105 101  CO2 12* 15* 17* 16* 17*  GLUCOSE 92 45* 72 134* 331*  BUN 95* 104* 98* 98* 96*  CREATININE 9.90* 11.75* 10.48* 10.34* 9.81*  CALCIUM 8.8 8.7 8.4 8.4 8.2*    Filed Vitals:   09/01/14 1001 09/01/14 1136 09/01/14 1413 09/01/14 1453  BP: 152/61  156/65   Pulse:   109   Temp:   97.4 F (36.3 C)   TempSrc:   Oral   Resp:   18   Height:      Weight:      SpO2:  100% 100% 100%   Exam Dyspneic No rash, cyanosis or gangrene Sclera anicteric, throat clear +JVD Chest bibasilar rales on insp, no wheezing, inc'd WOB RRR tachy , intermittent S3 gallop, no M or rub Abd soft, NTND, no ascites or HSM No LE edema 1+ presacral edema No ulcers, wounds or gangrene Neuro is Ox 3, nf, no asterixis  CXR no edema or infiltrate UA 1.014, 7.0, 100 prot, 0-2 wbc, 3-6 rbc, rare epi's Renal US - R kidney 7 cm, ^'d echo, no hydro, renal atrophy                   - L kidney 9.1 cm w ^'d echo, no hydro, renal atrophy   Assessment: 1. Acute on CKD 4 vs progression of CKD to ESRD - CKD is likely due to DM/ HTN.  She sees Dr Posey Pronto at [facility].  Her US shows nonfunctional 7 cm R kidney, and smallish left kidney at 9 cm.  She may well have ESRD.  With the IVF"s she has became very SOB and this is likely due to vol overload.  For now will stop IVF"s, start IV lasix and increase O2 as needed.  If she deteriorates will do acute HD, otherwise will watch for 24-48 hours to see if renal function is  growing to improve and if not will initiate dialysis.  I've stopped the abx and IV steroids as this is fluid overload and she cannot  tolerate any more volume.  2. DM 2 long standing, never on insulin 3. HTN on norvasc 4. Anemia Hb 8.0, prob d/t CKD 5. Hyperkalemia - changed to renal diet, observe   Plan- as above.   Kelly Splinter MD (pgr) [phone]    [phone] 09/01/2014, 7:49 PM

## 2014-09-01 NOTE — Progress Notes (Addendum)
Hypoglycemic Event  CBG: 63  Treatment: 15 GM carbohydrate snack  Symptoms: None  Follow-up CBG: Time:0450 CBG Result:53, 40% glucogel given, @ 0517 rechecked blood sugar 84 mg/dl  Possible Reasons for Event: Inadequate meal intake  Comments/MD notified:M Burnadette PeterLynch, NP notified. Order received to give Dextrose 50% IV push if blood sugar still remains low    Natalie Wolfe  Remember to initiate Hypoglycemia Order Set & complete

## 2014-09-01 NOTE — Progress Notes (Signed)
Utilization review completed.  

## 2014-09-01 NOTE — Progress Notes (Signed)
Pt c/o SOB, checked oxygen saturation is 100% on RA, clear and diminished breath sound on auscultation. Pt is placed on 2 liter of nasal cannula  to aid in breathing. On-call provider Daphane ShepherdM Lynch, NP notified. No new orders received at this time. Pt verbalizes her breathing is getting better.  Rapid Response Team also made aware of pt's blood sugar and breathing pattern. Will cont to monitor.

## 2014-09-01 NOTE — Progress Notes (Signed)
On-call provider Daphane ShepherdM Lynch, NP notified of  am potassium result (5.7)  via text page,  and also made aware of elevated bp 166/59 mm  of hg.  Awaiting orders.

## 2014-09-01 NOTE — Progress Notes (Addendum)
Paged MD about K:6.0 and inform about pt. Output being 200cc. No new orders placed at this time.

## 2014-09-01 NOTE — Progress Notes (Addendum)
PROGRESS NOTE  Natalie Wolfe ZOX:[id number] DOB: 01-30-1930 DOA: 08/31/2014 PCP: [name], MD  HPI/Recap of past 24 hours: C/o sob and wheezing. Reported symptom started 1-2weeks ago, was seen by pmd, was given abx , not able to provide details. Denies chest pain, no cough.  Reported not able to eat due to all teeth pulled out and waiting for dentures.  RN reported patient has diarrhea, patient denies ab pain, no n/v.  Assessment/Plan: Active Problems:   Essential hypertension   Anemia of chronic disease   Acute renal failure  Wheezing, prior smoker, cxr mild right lower lobe atelectasis. possible copd exacerbation, although no official diagnosis. Will treat with duonebs/rocephin/zithro/mucinex/oxygen supplement   AKI on CKD- insert foley, IVF, strict I/Os, renal U/S, medical renal disease, no obstruction. renal consult if Cr does not improve- patient appears dry,  Check ck, upep/spep to r/o other cause of renal failure. Does has anemia. Ca wnl. Renal dosing meds.  Hyperkalemia:Does has mild hyperkalemia, in the setting of dehydration and hypoglycemia, will try duoneb. Low k diet first. No acute ekg changes. On tele.  Hypoglycemia: secondary to decrease po intake? Will check cpeptite/serum insulin level. On d10, hypoglycemia protocol. Repeat bmp, consider d/c d10, continue d5/ns. Steroid started for copd should help increase blood surgar.  Diarrhea?  cdiff pending, empirical flagyl for now.   HTN- IV PRN meds, resume home meds  Anemia: microcystic. Anemia work up in progress, no indication for transfusion today. No overt bleed.  DM- SSI, monitor carefully as AKI- has been hypoglycemic  Code Status: full  Family Communication: patient  Disposition Plan: remain inpatient   Consultants:  none  Procedures:  none  Antibiotics:  Rocephin/zithro from 1/24  Flagyl from 1/24   Objective: BP 152/61 mmHg  Pulse 95  Temp(Src) 98.1 F (36.7 C) (Oral)   Resp 18  Ht  (1.651 m)  Wt 66.4 kg (146 lb 6.2 oz)  BMI 24.36 kg/m2  SpO2 100%  Intake/Output Summary (Last 24 hours) at 09/01/14 1025 Last data filed at 09/01/14 8119  Gross per 24 hour  Intake 2070.83 ml  Output    300 ml  Net 1770.83 ml   Filed Weights   08/31/14 0618 09/01/14 0614  Weight: 78.472 kg (173 lb) 66.4 kg (146 lb 6.2 oz)    Exam:   General:  Frail in mild distress due to sob  Cardiovascular: sinus tachycardia  Respiratory: diffuse wheezing bialterally  Abdomen: soft/NT/ND, positive bs.  Musculoskeletal: no edema  Data Reviewed: Basic Metabolic Panel:  Recent Labs Lab 08/29/14 0842 08/31/14 0633 09/01/14 0442  NA 134 133* 135  K 5.9* 4.9 5.7*  CL 98 104 105  CO2 12* 15* 17*  GLUCOSE 92 45* 72  BUN 95* 104* 98*  CREATININE 9.90* 11.75* 10.48*  CALCIUM 8.8 8.7 8.4   Liver Function Tests:  Recent Labs Lab 08/29/14 0842 08/31/14 0633 09/01/14 0442  AST ALT ALKPHOS 92 75 78  BILITOT 0.3 0.5 0.3  PROT 6.8 6.7 6.2  ALBUMIN  --  3.2* 3.0*   No results for input(s): LIPASE, AMYLASE in the last 168 hours. No results for input(s): AMMONIA in the last 168 hours. CBC:  Recent Labs Lab 08/29/14 0842 08/31/14 0633 09/01/14 0442  WBC 8.5 6.1 7.5  NEUTROABS 7.2* 5.6  --   HGB 9.3* 8.2* 8.0*  HCT 28.6* 24.8* 24.5*  MCV 77* 76.1* 76.8*  PLT 212 179 197   Cardiac  Enzymes:   No results for input(s): CKTOTAL, CKMB, CKMBINDEX, TROPONINI in the last 168 hours. BNP (last 3 results) No results for input(s): PROBNP in the last 8760 hours. CBG:  Recent Labs Lab 09/01/14 0003 09/01/14 0420 09/01/14 0450 09/01/14 0517 09/01/14 0755  GLUCAP 90 63* 53* 84 97    Recent Results (from the past 240 hour(s))  MRSA PCR Screening     Status: None   Collection Time: 09/01/14  3:00 AM  Result Value Ref Range Status   MRSA by PCR NEGATIVE NEGATIVE Final    Comment:        The GeneXpert MRSA Assay (FDA approved for NASAL  specimens only), is one component of a comprehensive MRSA colonization surveillance program. It is not intended to diagnose MRSA infection nor to guide or monitor treatment for MRSA infections.      Studies: Dg Chest 2 View  08/31/2014   CLINICAL DATA:  Weakness for 2 days  EXAM: CHEST  2 VIEW  COMPARISON:  None.  FINDINGS: Cardiac shadow is within normal limits. The lungs are well aerated bilaterally. Very minimal atelectatic changes are noted in the posterior costophrenic angle on the lateral projection likely in the posterior right lower lobe.  IMPRESSION: Minimal right lower lobe atelectasis.   Electronically Signed   By: Alcide CleverMark  Lukens M.D.   On: 08/31/2014 07:13   [place] Renal  08/31/2014   CLINICAL DATA:  Acute kidney injury. Chronic kidney disease stage 2. Diabetes and hypertension  EXAM: RENAL/URINARY TRACT ULTRASOUND COMPLETE  COMPARISON:  None.  FINDINGS: Right Kidney:  Length: 7 cm. Increased parenchymal echogenicity. Inferior pole cyst measures 6 mm. No mass or hydronephrosis noted.  Left Kidney:  Length: 9.1 cm. Increased parenchymal echogenicity. Inferior pole cyst measures 3.1 x 3.1 x 3.0 cm. No mass or hydronephrosis.  Bladder:  Collapsed around a Foley catheter.  Other:  Bilateral pleural effusions noted.  IMPRESSION: 1. No obstructive uropathy. 2. Bilateral renal atrophy and increased parenchymal echogenicity compatible with chronic medical renal disease.   Electronically Signed   By: Signa Kellaylor  Stroud M.D.   On: 08/31/2014 12:57    Scheduled Meds: . amLODipine  5 mg Oral Daily  . azithromycin  500 mg Intravenous Q24H  . cefTRIAXone (ROCEPHIN)  IV  1 g Intravenous Q24H  . heparin  5,000 Units Subcutaneous 3 times per day  . insulin aspart  0-5 Units Subcutaneous QHS  . insulin aspart  0-9 Units Subcutaneous TID WC  . ipratropium-albuterol  3 mL Nebulization Q4H  . ipratropium-albuterol  3 mL Nebulization Q2H  . methylPREDNISolone (SOLU-MEDROL) injection  60 mg Intravenous Q6H    . metronidazole  500 mg Intravenous Q8H    Continuous Infusions: . dextrose 50 mL/hr at 08/31/14 2301  . dextrose 5 % and 0.9% NaCl 125 mL/hr at 09/01/14 1002      Nachmen Mansel  [facility] Pager [phone]. If 7PM-7AM, please contact night-coverage at [email or web address], password [facility] 09/01/2014, 10:25 AM  LOS: 1 day     Repeat bmp showed persistent mild hyperkalemia, metabolic acidosis, will give iv bicarb pushx1, then sodium bicarb po supplement tid.    Repeat bmp in pm, k 6, will give bicarb/insulin/kayexlate/cacium gluconate. Nephrology paged.   Blood sugar now elevated, change ivf to ns/ d/c d5/ns.

## 2014-09-01 NOTE — Progress Notes (Signed)
CRITICAL VALUE ALERT  Critical value received:  CK-MB   Date of notification:  09/01/14   Time of notification:  11:36a  Critical value read back: YES   Nurse who received alert: Roselyn Reefiffany Luciann Gossett RN   MD notified (1st page): F. XU  Time of first page:  11:38a  MD notified (2nd page): N/A; Orders placed   Time of second page:  Responding MD:    Time MD responded:

## 2014-09-01 NOTE — Telephone Encounter (Signed)
Pt was sent to ED by on call provider and is currently admitted to the hospital. Nephrology has already evaluated pt.

## 2014-09-01 NOTE — Progress Notes (Signed)
Error in charting.

## 2014-09-02 DIAGNOSIS — N186 End stage renal disease: Secondary | ICD-10-CM

## 2014-09-02 LAB — BASIC METABOLIC PANEL
Anion gap: 16 — ABNORMAL HIGH (ref 5–15)
BUN: 95 mg/dL — ABNORMAL HIGH (ref 6–23)
CHLORIDE: 102 mmol/L (ref 96–112)
CO2: 20 mmol/L (ref 19–32)
Calcium: 8.8 mg/dL (ref 8.4–10.5)
Creatinine, Ser: 9.31 mg/dL — ABNORMAL HIGH (ref 0.50–1.10)
GFR, EST AFRICAN AMERICAN: 4 mL/min — AB (ref >90)
GFR, EST NON AFRICAN AMERICAN: 3 mL/min — AB (ref >90)
GLUCOSE: 308 mg/dL — AB (ref 70–99)
POTASSIUM: 5.1 mmol/L (ref 3.5–5.1)
SODIUM: 138 mmol/L (ref 135–145)

## 2014-09-02 LAB — CBC
HCT: 21.7 % — ABNORMAL LOW (ref 36.0–46.0)
HEMOGLOBIN: 7.1 g/dL — AB (ref 12.0–15.0)
MCH: 25.7 pg — ABNORMAL LOW (ref 26.0–34.0)
MCHC: 32.7 g/dL (ref 30.0–36.0)
MCV: 78.6 fL (ref 78.0–100.0)
Platelets: 181 10*3/uL (ref 150–400)
RBC: 2.76 MIL/uL — AB (ref 3.87–5.11)
RDW: 18.8 % — ABNORMAL HIGH (ref 11.5–15.5)
WBC: 6.4 10*3/uL (ref 4.0–10.5)

## 2014-09-02 LAB — GLUCOSE, CAPILLARY
GLUCOSE-CAPILLARY: 268 mg/dL — AB (ref 70–99)
GLUCOSE-CAPILLARY: 128 mg/dL — AB (ref 70–99)
GLUCOSE-CAPILLARY: 133 mg/dL — AB (ref 70–99)
Glucose-Capillary: 151 mg/dL — ABNORMAL HIGH (ref 70–99)
Glucose-Capillary: 160 mg/dL — ABNORMAL HIGH (ref 70–99)
Glucose-Capillary: 247 mg/dL — ABNORMAL HIGH (ref 70–99)
Glucose-Capillary: 253 mg/dL — ABNORMAL HIGH (ref 70–99)

## 2014-09-02 LAB — FERRITIN: FERRITIN: 547 ng/mL — AB (ref 10–291)

## 2014-09-02 LAB — HEPATITIS B SURFACE ANTIGEN: HEP B S AG: NEGATIVE

## 2014-09-02 LAB — IRON AND TIBC
IRON: 33 ug/dL — AB (ref 42–145)
SATURATION RATIOS: 18 % — AB (ref 20–55)
TIBC: 187 ug/dL — AB (ref 250–470)
UIBC: 154 ug/dL (ref 125–400)

## 2014-09-02 LAB — HEPATITIS B CORE ANTIBODY, IGM: HEP B C IGM: NONREACTIVE

## 2014-09-02 MED ORDER — IPRATROPIUM-ALBUTEROL 0.5-2.5 (3) MG/3ML IN SOLN
3.0000 mL | RESPIRATORY_TRACT | Status: DC | PRN
  Administered 2014-09-02 – 2014-09-04 (×5): 3 mL via RESPIRATORY_TRACT
  Filled 2014-09-02 (×5): qty 3

## 2014-09-02 MED ORDER — DARBEPOETIN ALFA 40 MCG/0.4ML IJ SOSY
40.0000 ug | PREFILLED_SYRINGE | Freq: Once | INTRAMUSCULAR | Status: AC
  Administered 2014-09-02: 40 ug via INTRAVENOUS
  Filled 2014-09-02: qty 0.4

## 2014-09-02 MED ORDER — FUROSEMIDE 10 MG/ML IJ SOLN
160.0000 mg | Freq: Three times a day (TID) | INTRAVENOUS | Status: DC
  Administered 2014-09-02 – 2014-09-03 (×2): 160 mg via INTRAVENOUS
  Filled 2014-09-02 (×4): qty 16

## 2014-09-02 MED ORDER — DARBEPOETIN ALFA 40 MCG/0.4ML IJ SOSY: 40.0000 ug | PREFILLED_SYRINGE | INTRAMUSCULAR | Status: DC

## 2014-09-02 MED ORDER — NA FERRIC GLUC CPLX IN SUCROSE 12.5 MG/ML IV SOLN
125.0000 mg | INTRAVENOUS | Status: DC
  Administered 2014-09-04: 125 mg via INTRAVENOUS
  Filled 2014-09-02 (×4): qty 10

## 2014-09-02 NOTE — Progress Notes (Signed)
  [facility] Progress Note   Subjective: SOB resolved, has had good response to IV lasix  Filed Vitals:   09/02/14 0424 09/02/14 0529 09/02/14 0834 09/02/14 1048  BP:  152/61  160/68  Pulse:  100 108   Temp:  98.2 F (36.8 C)    TempSrc:  Oral    Resp:  18 18   Height:      Weight: 67 kg (147 lb 11.3 oz)     SpO2:  100% 98%    Exam: Calm, no ^WOB + mild JVD Chest is clear bilat today RRR no MRG Abd soft, NTND, no ascites or HSM No LE edema or UE edema Neuro is Ox 3, nf, no asterixis  Repeat CXR +new vasc congestion 1/24 pm UA 1.014, 7.0, 100 prot, 0-2 wbc, 3-6 rbc, rare epi's Renal US - R kidney 7 cm, ^'d echo, no hydro, renal atrophy  - L kidney 9.1 cm w ^'d echo, no hydro, renal atrophy   Assessment: 1. Renal failure - severe, nonoliguric. I think this is probably ESRD. Will watch another day.   2. Dyspnea - acute onset after IVF"s, resolved with IV lasix 3. DM 2 long standing 4. HTN on norvasc 5. Anemia Hb 7.1, prob d/t CKD; start ESA and transfuse with first HD if needed 6. Hyperkalemia - changed to renal diet, improved  Plan - don't think she will be able to avoid dialysis. Have asked [facility] to put her on the schedule for access (permanent and prob catheter) for Wednesday. Patient is agreeable and I've discussed with her only close relative her cousin and he is in favor as well.  Cont IV lasix today then will stop.      Vinson Moselleob Ceniya Fowers MD  pager [phone]    cell [phone]  09/02/2014, 12:52 PM     Recent Labs Lab 09/01/14 0957 09/01/14 1651 09/02/14 0618  NA 133* 132* 138  K 5.8* 6.0* 5.1  CL 105 101 102  CO2 16* 17* 20  GLUCOSE 134* 331* 308*  BUN 98* 96* 95*  CREATININE 10.34* 9.81* 9.31*  CALCIUM 8.4 8.2* 8.8    Recent Labs Lab 08/29/14 0842 08/31/14 0633 09/01/14 0442  AST 14 21 28   ALT 5 10 12   ALKPHOS 92 75 78  BILITOT 0.3 0.5 0.3  PROT 6.8 6.7 6.2  ALBUMIN  --  3.2* 3.0*    Recent Labs Lab  08/29/14 0842 08/31/14 0633 09/01/14 0442 09/02/14 0618  WBC 8.5 6.1 7.5 6.4  NEUTROABS 7.2* 5.6  --   --   HGB 9.3* 8.2* 8.0* 7.1*  HCT 28.6* 24.8* 24.5* 21.7*  MCV 77* 76.1* 76.8* 78.6  PLT 212 179 197 181   . amLODipine  5 mg Oral Daily  . furosemide  160 mg Intravenous Q6H  . heparin  5,000 Units Subcutaneous 3 times per day  . insulin aspart  0-5 Units Subcutaneous QHS  . insulin aspart  0-9 Units Subcutaneous TID WC     acetaminophen **OR** acetaminophen, ipratropium-albuterol, ondansetron **OR** ondansetron (ZOFRAN) IV

## 2014-09-02 NOTE — Progress Notes (Signed)
Inpatient Diabetes Program Recommendations  AACE/ADA: New Consensus Statement on Inpatient Glycemic Control (2013)  Target Ranges:  Prepandial:   less than 140 mg/dL      Peak postprandial:   less than 180 mg/dL (1-2 hours)      Critically ill patients:  140 - 180 mg/dL   Results for Leonia ReaderRICHARDS, Tiamarie (MRN [id number]) as of 09/02/2014 09:34  Ref. Range 09/01/2014 04:50 09/01/2014 05:17 09/01/2014 07:55 09/01/2014 11:41 09/01/2014 15:59 09/01/2014 20:11 09/02/2014 00:11 09/02/2014 04:03 09/02/2014 07:33  Glucose-Capillary Latest Range: 70-99 mg/dL 53 (L) 84 97 409167 (H) 811232 (H) 382 (H) 253 (H) 268 (H) 247 (H)    Diabetes history: DM 2 Outpatient Diabetes medications: Amaryl 1mg  Daily Current orders for Inpatient glycemic control: Novolog 0-9 units TID with meals, Novolog 0-5 units QHS  Note: Patient received 2 doses of solumedrol 60 mg yesterday, which increased the blood glucose to 382 mg/dl. Noted patient had two occurences of hypoglycemia one initially on 1/23 with 45 mg/dl, the other on 9/141/24 [id number] with 53 mg/dl. Noted pt did not receive HS correction coverage last night due to the correction scale and CBGs not coinciding. Will watch trends.   Inpatient Diabetes Program Recommendations Correction (SSI): Please change order for CBGs to ACHS to coincide with the Novolog correction scale(Novolog is currently ordered ACHS).  Thanks,  Christena DeemShannon Oceania Noori RN, MSN, [facility] Inpatient Diabetes Coordinator Team Pager [phone]

## 2014-09-02 NOTE — Progress Notes (Signed)
Called to pt's room . Pt was noted walking around naked in the room . Pt pulled out PIV and foley catheter. Minimal bleeding noted from PIV and catheter site. Pt is alert  and states" I  Was going to close the door. This place does not look safe". Reorientation given to pt.  On-call provider Merdis DelayK Schorr, NP made aware. Pt has new foley catheter inserted. No further  bleeding noted from catheter site. Bed alarm remains on. Call bell is placed with in pt's reach. Will cont to monitor.

## 2014-09-02 NOTE — Progress Notes (Signed)
Nutrition Brief Note  Patient identified on the Malnutrition Screening Tool (MST) Report  Wt Readings from Last 15 Encounters:  09/02/14 147 lb 11.3 oz (67 kg)  04/03/14 142 lb (64.411 kg)  01/02/14 158 lb (71.668 kg)  12/18/13 161 lb (73.029 kg)  11/27/13 160 lb (72.576 kg)  07/24/13 166 lb 9.6 oz (75.569 kg)  05/01/13 170 lb 12.8 oz (77.474 kg)  01/10/13 172 lb (78.019 kg)  11/27/12 172 lb 3.2 oz (78.109 kg)   Natalie Wolfe is a 81 y.o. female [facility] She has been complaining of b/l weakness in her legs and that progressed to all over weakness. She has fallen after he knees gave out. Denies LOC and denies hitting head. She has not been able to eat well for the last month as she had several teeth pulled and has trouble eating. Yesterday, she only drank coffee. + dizziness, no fever, no chills, no SOB, no CP.   Pt reports appetite is good at baseline. She reports she ate well at lunch, but very little at breakfast. She reveals that she has a difficult time chewing foods here, as she left her dentures at home. She is agreeable to diet downgrade for ease of intake- RD will will downgrade to dysphagia 2 consistency.  Pt reports wt loss has been intentional. She reports she used to weigh 200# and she has steadily lost weight over the past several years by being more active. She shares that she walks daily with a group from the local senior center.   Nephrology has been consulted- pt for HD catheter placement on 09/04/14.  Body mass index is 24.58 kg/(m^2). Patient meets criteria for normal weight range based on current BMI.   Current diet order is renal with 1200 ml fluid restriction, patient is consuming approximately 50-100% of meals at this time. Labs and medications reviewed.   No nutrition interventions warranted at this time. If nutrition issues arise, please consult RD.   Natalie Wolfe, RD, LDN, CDE Pager: [phone] After hours  Pager: [phone]

## 2014-09-02 NOTE — Progress Notes (Signed)
PROGRESS NOTE  Natalie Wolfe WUJ:[id number] DOB: [birth year] DOA: 08/31/2014 PCP: [name], MD  HPI/Recap of past 24 hours: Feeling better, 500 cc urine documented from yesterday Reported not able to eat due to all teeth pulled out and waiting for dentures.    Assessment/Plan: Active Problems:   Essential hypertension   Anemia of chronic disease   Acute renal failure   AKI on CKD- renal U/S, medical renal disease, no obstruction. renal consulted,  Check ck, upep/spep/hepatitis panel to r/o other cause of renal failure. Does has anemia. Ca wnl. Renal dosing meds. --likely will need HD  Hyperkalemia:Does has mild hyperkalemia, in the setting of dehydration and hypoglycemia, will try duoneb. Low k diet first. No acute ekg changes. On tele. Improved on 1/25  Hypoglycemia: secondary to decrease po intake? Will check cpeptite/serum insulin level. Received d10 on admission.  Resolved.  Diarrhea?  cdiff negative, d/c flagyl   HTN- IV PRN meds, resume home meds  Anemia: microcystic. Anemia work up in progress, no indication for transfusion today. No overt bleed. ESA started by nephrology   DM- SSI, monitor carefully as AKI- has been hypoglycemic  Code Status: full  Family Communication: patient and nephew  Disposition Plan: remain inpatient   Consultants:  nephrology  Procedures:  none  Antibiotics:  Rocephin/zithro on 1/24  Flagyl on 1/24   Objective: BP 147/55 mmHg  Pulse 97  Temp(Src) 98.3 F (36.8 C) (Oral)  Resp 18  Ht  (1.651 m)  Wt 67 kg (147 lb 11.3 oz)  BMI 24.58 kg/m2  SpO2 99%  Intake/Output Summary (Last 24 hours) at 09/02/14 1516 Last data filed at 09/02/14 1439  Gross per 24 hour  Intake 3339.17 ml  Output   1500 ml  Net 1839.17 ml   Filed Weights   08/31/14 0618 09/01/14 0614 09/02/14 0424  Weight: 78.472 kg (173 lb) 66.4 kg (146 lb 6.2 oz) 67 kg (147 lb 11.3 oz)    Exam:   General:  Feeling better,  nad  Cardiovascular: rrr  Respiratory: wheezing resolved, diminished at bases  Abdomen: soft/NT/ND, positive bs.  Musculoskeletal: no edema  Data Reviewed: Basic Metabolic Panel:  Recent Labs Lab 08/31/14 0633 09/01/14 0442 09/01/14 0957 09/01/14 1651 09/02/14 0618  NA 133* 135 133* 132* 138  K 4.9 5.7* 5.8* 6.0* 5.1  CL 104 105 105 101 102  CO2 15* 17* 16* 17* 20  GLUCOSE 45* 72 134* 331* 308*  BUN 104* 98* 98* 96* 95*  CREATININE 11.75* 10.48* 10.34* 9.81* 9.31*  CALCIUM 8.7 8.4 8.4 8.2* 8.8   Liver Function Tests:  Recent Labs Lab 08/29/14 0842 08/31/14 0633 09/01/14 0442  AST ALT ALKPHOS 92 75 78  BILITOT 0.3 0.5 0.3  PROT 6.8 6.7 6.2  ALBUMIN  --  3.2* 3.0*   No results for input(s): LIPASE, AMYLASE in the last 168 hours. No results for input(s): AMMONIA in the last 168 hours. CBC:  Recent Labs Lab 08/29/14 0842 08/31/14 0633 09/01/14 0442 09/02/14 0618  WBC 8.5 6.1 7.5 6.4  NEUTROABS 7.2* 5.6  --   --   HGB 9.3* 8.2* 8.0* 7.1*  HCT 28.6* 24.8* 24.5* 21.7*  MCV 77* 76.1* 76.8* 78.6  PLT 212 179 197 181   Cardiac Enzymes:    Recent Labs Lab 09/01/14 0957  CKTOTAL 155  CKMB 6.4*   BNP (last 3 results) No results for input(s): PROBNP in the last 8760 hours. CBG:  Recent Labs Lab 09/01/14 2011 09/02/14 0011 09/02/14 0403 09/02/14 0733 09/02/14 1157  GLUCAP 382* 253* 268* 247* 128*    Recent Results (from the past 240 hour(s))  MRSA PCR Screening     Status: None   Collection Time: 09/01/14  3:00 AM  Result Value Ref Range Status   MRSA by PCR NEGATIVE NEGATIVE Final    Comment:        The GeneXpert MRSA Assay (FDA approved for NASAL specimens only), is one component of a comprehensive MRSA colonization surveillance program. It is not intended to diagnose MRSA infection nor to guide or monitor treatment for MRSA infections.   Clostridium Difficile by PCR     Status: None   Collection Time: 09/01/14  12:30 PM  Result Value Ref Range Status   C difficile by pcr NEGATIVE NEGATIVE Final     Studies: Dg Chest Port 1 View  09/01/2014   CLINICAL DATA:  Shortness of breath. Stage IV renal disease due to diabetes.  EXAM: PORTABLE CHEST - 1 VIEW  COMPARISON:  08/31/2014  FINDINGS: Normal heart size. Pulmonary vascularity is increasing since prior study with development of slight interstitial pattern in the lung bases. This suggest developing edema. Small left pleural effusion is also developing. Calcification of aorta. No pneumothorax. Mediastinal contours are intact.  IMPRESSION: Developing pulmonary vascular congestion edema with small left pleural effusion.   Electronically Signed   By: Burman NievesWilliam  Stevens M.D.   On: 09/01/2014 22:27    Scheduled Meds: . amLODipine  5 mg Oral Daily  . darbepoetin (ARANESP) injection - DIALYSIS  40 mcg Intravenous Once  . [START ON 09/11/2014] darbepoetin (ARANESP) injection - DIALYSIS  40 mcg Intravenous Q Wed-HD  . [START ON 09/04/2014] ferric gluconate (FERRLECIT/NULECIT) IV  125 mg Intravenous Q M,W,F-HD  . furosemide  160 mg Intravenous 3 times per day  . heparin  5,000 Units Subcutaneous 3 times per day  . insulin aspart  0-5 Units Subcutaneous QHS  . insulin aspart  0-9 Units Subcutaneous TID WC    Continuous Infusions: no      Marsha Hillman  [facility] Pager [phone]. If 7PM-7AM, please contact night-coverage at [email or web address], password [email or web address] 09/02/2014, 3:16 PM  LOS: 2 days

## 2014-09-02 NOTE — Progress Notes (Signed)
Paged to on-call provider Merdis DelayK Schorr, NP  Via text page and made aware of CXR result.

## 2014-09-02 NOTE — Consult Note (Signed)
[facility] [place] CONSULT [place]   MRN : [id number]  Reason for Consult:  ESRD   History of Present Illness: 81 y/o AA female brought to the ED secondary to fall.  She had complained of having a poor apatite and feeling weak.  Since being admitted she had SOB.   Her IV fluids were stopped and lasix was given.  She is improving.  Her base line Cr was 2.0 and is now 9.3.  We have been asked to plan fistula verses graft dialysis access and possibly diatek catheter as soon as Wends day.  Her past medical history includes: CKD, HTN, hyperlipidemia and DM.  She takes a statin daily and ACE inhibitor.       Current Facility-Administered Medications  Medication Dose Route Frequency Provider Last Rate Last Dose  . acetaminophen (TYLENOL) tablet 650 mg  650 mg Oral Q6H PRN Joseph ArtJessica U Vann, DO       Or  . acetaminophen (TYLENOL) suppository 650 mg  650 mg Rectal Q6H PRN Joseph ArtJessica U Vann, DO      . amLODipine (NORVASC) tablet 5 mg  5 mg Oral Daily Joseph ArtJessica U Vann, DO   5 mg at 09/02/14 1048  . furosemide (LASIX) 160 mg in dextrose 5 % 50 mL IVPB  160 mg Intravenous Q6H Maree Krabbeobert D Schertz, MD   160 mg at 09/02/14 1048  . heparin injection 5,000 Units  5,000 Units Subcutaneous 3 times per day Joseph ArtJessica U Vann, DO   5,000 Units at 09/02/14 0541  . insulin aspart (novoLOG) injection 0-5 Units  0-5 Units Subcutaneous QHS Joseph ArtJessica U Vann, DO   0 Units at 08/31/14 2133  . insulin aspart (novoLOG) injection 0-9 Units  0-9 Units Subcutaneous TID WC Joseph ArtJessica U Vann, DO   1 Units at 09/02/14 1202  . ipratropium-albuterol (DUONEB) 0.5-2.5 (3) MG/3ML nebulizer solution 3 mL  3 mL Nebulization Q4H PRN Albertine GratesFang Xu, MD      . ondansetron [facility](ZOFRAN) tablet 4 mg  4 mg Oral Q6H PRN Joseph ArtJessica U Vann, DO       Or  . ondansetron (ZOFRAN) injection 4 mg  4 mg Intravenous Q6H PRN Joseph ArtJessica U Vann, DO        Pt meds include: Statin :Yes Betablocker: No ASA: No Other anticoagulants/antiplatelets: none  Past Medical  History  Diagnosis Date  . Urinary frequency   . Routine general medical examination at a health care facility   . Other atopic dermatitis and related conditions   . Rash and other nonspecific skin eruption   . Other anxiety states   . Insomnia, unspecified   . Chronic kidney disease, stage II (mild)   . Type II or unspecified type diabetes mellitus with renal manifestations, not stated as uncontrolled   . PVD (peripheral vascular disease)   . Unspecified vitamin D deficiency   . Anemia, unspecified   . Obesity, unspecified   . Nocturia   . Pain in joint, lower leg   . Other and unspecified hyperlipidemia   . Unspecified essential hypertension   . Cataract     Past Surgical History  Procedure Laterality Date  . Abdominal hysterectomy  1970  . Breast surgery  1979    nodule removed  . Eye surgery  2004    catarcts removed from right eye.  . Multiple tooth extractions  09/2013    Remonve remaining 3 teeth, no with dentures     Social History History  Substance Use Topics  .  Smoking status: Former Smoker    Types: Cigarettes    Quit date: 08/10/2000  . Smokeless tobacco: Not on file  . Alcohol Use: No    Family History Family History  Problem Relation Age of Onset  . Diabetes Mother     No Known Allergies   REVIEW OF SYSTEMS  General:  Weight loss,  Fever,  chills Neurologic:  Dizziness,  Blackouts,  Seizure  Stroke,  "Mini stroke",  Slurred speech,  Temporary blindness;  weakness in arms or legs,  Hoarseness  Dysphagia Cardiac:  Chest pain/pressure, [x ] Shortness of breath at rest  Shortness of breath with exertion,  Atrial fibrillation or irregular heartbeat  Vascular:  Pain in legs with walking,  Pain in legs at rest,  Pain in legs at night,   Non-healing ulcer,  Blood clot in vein/DVT,   Pulmonary:  Home oxygen,  Productive cough,  Coughing up blood,  Asthma,   Wheezing   COPD Musculoskeletal:   Arthritis,  Low back pain,  Joint pain Hematologic:  Easy Bruising,  Anemia;  Hepatitis Gastrointestinal:  Blood in stool,  Gastroesophageal Reflux/heartburn, Urinary: [x ] chronic Kidney disease,  on HD -  MWF or  TTHS,  Burning with urination,  Difficulty urinating Skin:  Rashes,  Wounds Psychological:  Anxiety,  Depression  Physical Examination Filed Vitals:   09/02/14 0424 09/02/14 0529 09/02/14 0834 09/02/14 1048  BP:  152/61  160/68  Pulse:  100 108   Temp:  98.2 F (36.8 C)    TempSrc:  Oral    Resp:  18 18   Height:      Weight: 147 lb 11.3 oz (67 kg)     SpO2:  100% 98%    Body mass index is 24.58 kg/(m^2).  General:  WDWN in NAD HENT: WNL Eyes: Pupils equal Pulmonary: normal non-labored breathing , without Rales, rhonchi,  wheezing Cardiac: RRR, without  Murmurs, rubs or gallops; No carotid bruits Abdomen: soft, NT, no masses Skin: no rashes, ulcers noted;  no Gangrene , no cellulitis; no open wounds;   Vascular Exam/Pulses:Palpable radial and DT/PT pulses bilaterally.    Musculoskeletal: no muscle wasting or atrophy; no edema  Neurologic: A&O X 3; Appropriate Affect ;  SENSATION: normal; MOTOR FUNCTION: 5/5 Symmetric Speech is fluent/normal   Significant Diagnostic Studies: CBC Lab Results  Component Value Date   WBC 6.4 09/02/2014   HGB 7.1* 09/02/2014   HCT 21.7* 09/02/2014   MCV 78.6 09/02/2014   PLT 181 09/02/2014    BMET    Component Value Date/Time   NA 138 09/02/2014 0618   NA 134 08/29/2014 0842   K 5.1 09/02/2014 0618   CL 102 09/02/2014 0618   CO2 20 09/02/2014 0618   GLUCOSE 308* 09/02/2014 0618   GLUCOSE 92 08/29/2014 0842   BUN 95* 09/02/2014 0618   BUN 95* 08/29/2014 0842   CREATININE 9.31* 09/02/2014 0618   CALCIUM 8.8 09/02/2014 0618   GFRNONAA 3* 09/02/2014 0618   GFRAA 4* 09/02/2014 0618   Estimated Creatinine Clearance: 4.2 mL/min (by C-G  formula based on Cr of 9.31).  COAG Lab Results  Component Value Date   INR 1.01 09/01/2014     Non-Invasive Vascular Imaging: Pending vein  mapping  ASSESSMENT/PLAN:  ESRD She is right hand dominant.  She is improving as far as her SOB and signs of fluid overload.  We will plan fistula creations verses graft pending the vein mapping results.  We will wait on catheter placement pending Nephrology's decision.     Clinton Gallant [facility] 09/02/2014 1:14 PM  I have examined the patient, reviewed and agree with above. Acute worsening of chronic renal insufficiency. Now approaching need for hemodialysis. Severe anemia. Hemoglobin and hematocrit 7 and 21.  Patient is right handed. Has very small surface veins bilaterally. Her left arm is being saved for access. She will undergo vein mapping. Will probably make decision regarding fistula attempt versus graft placement at the time of surgery due to her small surface veins. Decision regarding catheter placement now or later pending nephrology recommendation. Explained that we will plan to proceed with surgery on Wednesday with a left arm AV fistula or graft with or without hemodialysis catheter  Sharmel Ballantine, MD 09/02/2014 4:44 PM

## 2014-09-03 ENCOUNTER — Encounter (HOSPITAL_COMMUNITY): Payer: Self-pay | Admitting: Anesthesiology

## 2014-09-03 ENCOUNTER — Ambulatory Visit: Payer: Medicare Other | Admitting: Internal Medicine

## 2014-09-03 ENCOUNTER — Inpatient Hospital Stay (HOSPITAL_COMMUNITY): Payer: Medicare Other

## 2014-09-03 DIAGNOSIS — J90 Pleural effusion, not elsewhere classified: Secondary | ICD-10-CM

## 2014-09-03 DIAGNOSIS — R0600 Dyspnea, unspecified: Secondary | ICD-10-CM

## 2014-09-03 DIAGNOSIS — N184 Chronic kidney disease, stage 4 (severe): Secondary | ICD-10-CM

## 2014-09-03 LAB — FOLATE RBC
FOLATE, HEMOLYSATE: 276.1 ng/mL
Folate, RBC: 1185 ng/mL (ref >498)
Hematocrit: 23.3 % — ABNORMAL LOW (ref 34.0–46.6)

## 2014-09-03 LAB — LEGIONELLA ANTIGEN, URINE

## 2014-09-03 LAB — PREPARE RBC (CROSSMATCH)

## 2014-09-03 LAB — PROTEIN ELECTROPHORESIS, SERUM
Albumin ELP: 51 % — ABNORMAL LOW (ref 55.8–66.1)
Alpha-1-Globulin: 8.3 % — ABNORMAL HIGH (ref 2.9–4.9)
Alpha-2-Globulin: 10.7 % (ref 7.1–11.8)
BETA 2: 6.8 % — AB (ref 3.2–6.5)
BETA GLOBULIN: 5.1 % (ref 4.7–7.2)
Gamma Globulin: 18.1 % (ref 11.1–18.8)
M-Spike, %: NOT DETECTED g/dL
TOTAL PROTEIN ELP: 6 g/dL (ref 6.0–8.3)

## 2014-09-03 LAB — BASIC METABOLIC PANEL
ANION GAP: 16 — AB (ref 5–15)
BUN: 99 mg/dL — ABNORMAL HIGH (ref 6–23)
CO2: 19 mmol/L (ref 19–32)
CREATININE: 9.4 mg/dL — AB (ref 0.50–1.10)
Calcium: 8.7 mg/dL (ref 8.4–10.5)
Chloride: 104 mmol/L (ref 96–112)
GFR, EST AFRICAN AMERICAN: 4 mL/min — AB (ref >90)
GFR, EST NON AFRICAN AMERICAN: 3 mL/min — AB (ref >90)
Glucose, Bld: 129 mg/dL — ABNORMAL HIGH (ref 70–99)
POTASSIUM: 4.6 mmol/L (ref 3.5–5.1)
Sodium: 139 mmol/L (ref 135–145)

## 2014-09-03 LAB — SURGICAL PCR SCREEN
MRSA, PCR: NEGATIVE
Staphylococcus aureus: NEGATIVE

## 2014-09-03 LAB — GLUCOSE, CAPILLARY
GLUCOSE-CAPILLARY: 118 mg/dL — AB (ref 70–99)
GLUCOSE-CAPILLARY: 130 mg/dL — AB (ref 70–99)
Glucose-Capillary: 90 mg/dL (ref 70–99)
Glucose-Capillary: 151 mg/dL — ABNORMAL HIGH (ref 70–99)

## 2014-09-03 LAB — CBC
HCT: 21.6 % — ABNORMAL LOW (ref 36.0–46.0)
HEMOGLOBIN: 7 g/dL — AB (ref 12.0–15.0)
MCH: 25.8 pg — ABNORMAL LOW (ref 26.0–34.0)
MCHC: 32.4 g/dL (ref 30.0–36.0)
MCV: 79.7 fL (ref 78.0–100.0)
PLATELETS: 188 10*3/uL (ref 150–400)
RBC: 2.71 MIL/uL — ABNORMAL LOW (ref 3.87–5.11)
RDW: 19.6 % — ABNORMAL HIGH (ref 11.5–15.5)
WBC: 11.7 10*3/uL — ABNORMAL HIGH (ref 4.0–10.5)

## 2014-09-03 LAB — HEPATITIS B SURFACE ANTIBODY,QUALITATIVE: Hep B S Ab: NONREACTIVE — AB

## 2014-09-03 LAB — INSULIN, RANDOM: INSULIN: 12 u[IU]/mL (ref 3–28)

## 2014-09-03 MED ORDER — CEFAZOLIN SODIUM 1-5 GM-% IV SOLN: 1.0000 g | INTRAVENOUS | Status: DC

## 2014-09-03 MED ORDER — FUROSEMIDE 10 MG/ML IJ SOLN
160.0000 mg | Freq: Once | INTRAMUSCULAR | Status: AC
  Administered 2014-09-03: 160 mg via INTRAVENOUS
  Filled 2014-09-03 (×2): qty 16

## 2014-09-03 MED ORDER — SODIUM CHLORIDE 0.9 % IV SOLN
Freq: Once | INTRAVENOUS | Status: AC
  Administered 2014-09-03: 12:00:00 via INTRAVENOUS

## 2014-09-03 MED ORDER — CEFAZOLIN SODIUM-DEXTROSE 2-3 GM-% IV SOLR
2.0000 g | INTRAVENOUS | Status: DC
  Filled 2014-09-03: qty 50

## 2014-09-03 NOTE — Progress Notes (Signed)
  Echocardiogram 2D Echocardiogram has been performed.  Natalie Wolfe, Natalie Wolfe 09/03/2014, 2:40 PM

## 2014-09-03 NOTE — Progress Notes (Signed)
  [facility] Progress Note   Subjective: no dyspnea, no other complaints, no appetite  Filed Vitals:   09/03/14 0507 09/03/14 0530 09/03/14 0639 09/03/14 0641  BP:  145/72  157/69  Pulse:    104  Temp:    98.3 F (36.8 C)  TempSrc:    Oral  Resp:    28  Height:      Weight:   66.089 kg (145 lb 11.2 oz)   SpO2: 90%   99%   Exam: Calm, alert,  Mild JVD Chest is clear bilat RRR no MRG Abd soft, NTND, no ascites or HSM No LE edema or UE edema Neuro is Ox 3, nf, no asterixis  Repeat CXR +new vasc congestion 1/24 pm UA 1.014, 7.0, 100 prot, 0-2 wbc, 3-6 rbc, rare epi's Renal US - R kidney 7 cm, ^'d echo, no hydro, renal atrophy; L kidney 9.1 cm w ^'d echo, no hydro, renal atrophy   Assessment: 1. Renal failure - severe, nonoliguric. Creatinine levelled off at 9, will need dialysis   2. Dyspnea - resolved 3. DM 2 long standing 4. HTN on norvasc 5. Anemia Hb 7.1, prob d/t CKD; start ESA and transfuse with first HD if needed 6. Hyperkalemia - changed to renal diet, improved  Plan - proceed with perm access and TDC placement , [facility] Stop las[facility], starting CLIP process for OP HD. Transfuse 2 units prbc's with HD tomorrow    Vinson Moselleob Willis Kuipers MD  pager [phone]    cell [phone]  09/03/2014, 12:05 PM     Recent Labs Lab 09/01/14 1651 09/02/14 0618 09/03/14 0610  NA 132* 138 139  K 6.0* 5.1 4.6  CL 101 102 104  CO2 17* 20 19  GLUCOSE 331* 308* 129*  BUN 96* 95* 99*  CREATININE 9.81* 9.31* 9.40*  CALCIUM 8.2* 8.8 8.7    Recent Labs Lab 08/29/14 0842 08/31/14 0633 09/01/14 0442  AST 14 21 28   ALT 5 10 12   ALKPHOS 92 75 78  BILITOT 0.3 0.5 0.3  PROT 6.8 6.7 6.2  ALBUMIN  --  3.2* 3.0*    Recent Labs Lab 08/29/14 0842  08/31/14 [id number] 09/01/14 0442 09/01/14 0957 09/02/14 0618 09/03/14 0610  WBC 8.5  < > 6.1 7.5  --  6.4 11.7*  NEUTROABS 7.2*  --  5.6  --   --   --   --   HGB 9.3*  --  8.2* 8.0*  --  7.1* 7.0*  HCT  28.6*  --  24.8* 24.5* 23.3* 21.7* 21.6*  MCV 77*  --  76.1* 76.8*  --  78.6 79.7  PLT 212  --  179 197  --  181 188  < > = values in this interval not displayed. Marland Kitchen. amLODipine  5 mg Oral Daily  . [START ON 09/04/2014]  ceFAZolin (ANCEF) IV  2 g Intravenous 60 min Pre-Op  . [START ON 09/11/2014] darbepoetin (ARANESP) injection - DIALYSIS  40 mcg Intravenous Q Wed-HD  . [START ON 09/04/2014] ferric gluconate (FERRLECIT/NULECIT) IV  125 mg Intravenous Q M,W,F-HD  . furosemide  160 mg Intravenous 3 times per day  . heparin  5,000 Units Subcutaneous 3 times per day  . insulin aspart  0-5 Units Subcutaneous QHS  . insulin aspart  0-9 Units Subcutaneous TID WC     acetaminophen **OR** acetaminophen, ipratropium-albuterol, ondansetron **OR** ondansetron (ZOFRAN) IV

## 2014-09-03 NOTE — Anesthesia Preprocedure Evaluation (Deleted)
Anesthesia Evaluation  Patient identified by MRN, date of birth, ID band Patient awake    Reviewed: Allergy & Precautions, NPO status , Patient's Chart, lab work & pertinent test results  History of Anesthesia Complications Negative for: history of anesthetic complications  Airway Mallampati: II  TM Distance: >3 FB Neck ROM: Full    Dental no notable dental hx. (+) Dental Advisory Given   Pulmonary former smoker,  breath sounds clear to auscultation  Pulmonary exam normal       Cardiovascular hypertension, Pt. on medications + Peripheral Vascular Disease Rhythm:Regular Rate:Normal     Neuro/Psych PSYCHIATRIC DISORDERS Anxiety negative neurological ROS     GI/Hepatic negative GI ROS, Neg liver ROS,   Endo/Other  diabetes, Type 2  Renal/GU CRF and ARFRenal disease  negative genitourinary   Musculoskeletal  (+) Arthritis -, Osteoarthritis,    Abdominal   Peds negative pediatric ROS (+)  Hematology  (+) anemia ,   Anesthesia Other Findings   Reproductive/Obstetrics negative OB ROS                             Anesthesia Physical Anesthesia Plan  ASA: IV  Anesthesia Plan: MAC   Post-op Pain Management:    Induction: Intravenous  Airway Management Planned: Natural Airway  Additional Equipment:   Intra-op Plan:   Post-operative Plan: Extubation in OR  Informed Consent: I have reviewed the patients History and Physical, chart, labs and discussed the procedure including the risks, benefits and alternatives for the proposed anesthesia with the patient or authorized representative who has indicated his/her understanding and acceptance.   Dental advisory given  Plan Discussed with: CRNA  Anesthesia Plan Comments:         Anesthesia Quick Evaluation

## 2014-09-03 NOTE — Progress Notes (Signed)
PROGRESS NOTE  Natalie Wolfe WUJ:[id number] DOB: [birth year] DOA: 08/31/2014 PCP: [name], MD  HPI/Recap of past 24 hours: C/o sob worse at night, 1250 urine documented from yesterday  Assessment/Plan: Active Problems:   Essential hypertension   Anemia of chronic disease   Acute renal failure   AKI on CKD- renal U/S, medical renal disease, no obstruction. renal consulted,  Check ck, upep/spep/hepatitis panel to r/o other cause of renal failure. Does has anemia. Ca wnl. Renal dosing meds. --on lasix, urine output has improved some, but still persistent pleural effusion, sob, oxygen requirement. No lower extremity edema though. likely will need HD, appreciate nephro/vaslular surgery input.  Hyperkalemia:Does has mild hyperkalemia, in the setting of dehydration and hypoglycemia, will try duoneb. Low k diet first. No acute ekg changes. On tele. Improved.  Pleural effusion,on lasix/oxygen supplement for now, likely HD.  likely secondary to renal failure, will order echocardiogram.  Hypoglycemia:  secondary to decrease po intake? Will check cpeptite/serum insulin level. Received d10 on admission.  Resolved.  Diarrhea?  cdiff negative, d/c flagyl.    HTN- IV PRN meds, resume home meds  Anemia: microcystic. Anemia work up in progress, no indication for transfusion today. No overt bleed. ESA started by nephrology   DM- SSI, monitor carefully as AKI- has been hypoglycemic  Code Status: full  Family Communication: patient  Disposition Plan: remain inpatient   Consultants:  Nephrology/vascular surgery  Procedures:  none  Antibiotics:   Rocephin/zithrox1 on 1/24  Flagylx1 on 1/24   Objective: BP 157/69 mmHg  Pulse 104  Temp(Src) 98.3 F (36.8 C) (Oral)  Resp 28  Ht  (1.651 m)  Wt 66.089 kg (145 lb 11.2 oz)  BMI 24.25 kg/m2  SpO2 99%  Intake/Output Summary (Last 24 hours) at 09/03/14 1152 Last data filed at 09/03/14 0950  Gross per 24  hour  Intake    952 ml  Output   1050 ml  Net    -98 ml   Filed Weights   09/01/14 0614 09/02/14 0424 09/03/14 0639  Weight: 66.4 kg (146 lb 6.2 oz) 67 kg (147 lb 11.3 oz) 66.089 kg (145 lb 11.2 oz)    Exam:   General:  C/o sob  Cardiovascular: rrr  Respiratory: mild wheezing , diminished at bases  Abdomen: soft/NT/ND, positive bs.  Musculoskeletal: no edema  Data Reviewed: Basic Metabolic Panel:  Recent Labs Lab 09/01/14 0442 09/01/14 0957 09/01/14 1651 09/02/14 0618 09/03/14 0610  NA 135 133* 132* 138 139  K 5.7* 5.8* 6.0* 5.1 4.6  CL 105 105 101 102 104  CO2 17* 16* 17* 20 19  GLUCOSE 72 134* 331* 308* 129*  BUN 98* 98* 96* 95* 99*  CREATININE 10.48* 10.34* 9.81* 9.31* 9.40*  CALCIUM 8.4 8.4 8.2* 8.8 8.7   Liver Function Tests:  Recent Labs Lab 08/29/14 0842 08/31/14 0633 09/01/14 0442  AST ALT ALKPHOS 92 75 78  BILITOT 0.3 0.5 0.3  PROT 6.8 6.7 6.2  ALBUMIN  --  3.2* 3.0*   No results for input(s): LIPASE, AMYLASE in the last 168 hours. No results for input(s): AMMONIA in the last 168 hours. CBC:  Recent Labs Lab 08/29/14 0842 08/31/14 9562 09/01/14 0442 09/01/14 0957 09/02/14 0618 09/03/14 0610  WBC 8.5 6.1 7.5  --  6.4 11.7*  NEUTROABS 7.2* 5.6  --   --   --   --   HGB 9.3* 8.2* 8.0*  --  7.1* 7.0*  HCT 28.6* 24.8* 24.5* 23.3* 21.7* 21.6*  MCV 77* 76.1* 76.8*  --  78.6 79.7  PLT 212 179 197  --  181 188   Cardiac Enzymes:    Recent Labs Lab 09/01/14 0957  CKTOTAL 155  CKMB 6.4*   BNP (last 3 results) No results for input(s): PROBNP in the last 8760 hours. CBG:  Recent Labs Lab 09/02/14 1157 09/02/14 1657 09/02/14 2018 09/02/14 2200 09/03/14 0852  GLUCAP 128* 151* 160* 133* 118*    Recent Results (from the past 240 hour(s))  MRSA PCR Screening     Status: None   Collection Time: 09/01/14  3:00 AM  Result Value Ref Range Status   MRSA by PCR NEGATIVE NEGATIVE Final    Comment:        The  GeneXpert MRSA Assay (FDA approved for NASAL specimens only), is one component of a comprehensive MRSA colonization surveillance program. It is not intended to diagnose MRSA infection nor to guide or monitor treatment for MRSA infections.   Clostridium Difficile by PCR     Status: None   Collection Time: 09/01/14 12:30 PM  Result Value Ref Range Status   C difficile by pcr NEGATIVE NEGATIVE Final     Studies: No results found.  Scheduled Meds: . amLODipine  5 mg Oral Daily  . [START ON 09/04/2014]  ceFAZolin (ANCEF) IV  2 g Intravenous 60 min Pre-Op  . [START ON 09/11/2014] darbepoetin (ARANESP) injection - DIALYSIS  40 mcg Intravenous Q Wed-HD  . [START ON 09/04/2014] ferric gluconate (FERRLECIT/NULECIT) IV  125 mg Intravenous Q M,W,F-HD  . furosemide  160 mg Intravenous 3 times per day  . heparin  5,000 Units Subcutaneous 3 times per day  . insulin aspart  0-5 Units Subcutaneous QHS  . insulin aspart  0-9 Units Subcutaneous TID WC    Continuous Infusions: no      [address]  [facility] Pager [phone]. If 7PM-7AM, please contact night-coverage at [email or web address], password [email or web address] 09/03/2014, 11:52 AM  LOS: 3 days

## 2014-09-03 NOTE — Progress Notes (Signed)
Medicare Important Message given? YES  (If response is "NO", the following Medicare IM given date fields will be blank)  Date Medicare IM given: 09/03/2014 Medicare IM given by: Caitlyne Ingham  

## 2014-09-03 NOTE — Progress Notes (Signed)
MD notified due to acute change in patient status. Vital signs and labs are listed below.  MD notified(1st page) Time of 1st page:  Schorr, NP Responding MD:  Clearence PedSchorr, NP Time MD responded: [id number] MD response: MD stated aware  Vital Signs Filed Vitals:   09/03/14 1821 09/03/14 2228 09/03/14 2235 09/03/14 2256  BP: 150/70 176/79  157/76  Pulse: 97 112    Temp: 98.4 F (36.9 C)   97.9 F (36.6 C)  TempSrc: Oral   Oral  Resp: 19 28    Height:      Weight:      SpO2: 98% 95% 96%      Lab Results WBC  Date/Time Value Ref Range Status  09/03/2014 06:10 AM 11.7* 4.0 - 10.5 K/uL Final  09/02/2014 06:18 AM 6.4 4.0 - 10.5 K/uL Final  09/01/2014 04:42 AM 7.5 4.0 - 10.5 K/uL Final  08/29/2014 08:42 AM 8.5 3.4 - 10.8 x10E3/uL Final  04/01/2014 10:12 AM 6.1 3.4 - 10.8 x10E3/uL Final  11/27/2013 10:20 AM 4.8 3.4 - 10.8 x10E3/uL Final   NEUTROPHILS RELATIVE %  Date/Time Value Ref Range Status  08/31/2014 06:33 AM 92* 43 - 77 % Final  08/29/2014 08:42 AM 86 % Final  04/01/2014 10:12 AM 67 % Final   No results found for: PCO2ART No results found for: LATICACIDVEN No results found for: Silas FloodCO2VEN   Thacker, Valon Glasscock Lea, RN 09/03/2014, 11:05 PM

## 2014-09-03 NOTE — Progress Notes (Signed)
   [facility] [place]  Plan AF fistula verses graft Left arm by Dr. Arbie CookeyEarly tomorrow.  Pre-op orders placed.   Clinton GallantCOLLINS, Treysean Petruzzi [facility]iacMAUREEN 09/03/2014 9:26 AM --

## 2014-09-03 NOTE — Progress Notes (Signed)
Right  Upper Extremity Vein Map    Cephalic  Segment Diameter Depth Comment  1. Axilla 2.6137mm    2. Mid upper arm 2.2165mm    3. Above [facility]   Unable to visualize due to IV  4. In [facility]   Unable to visualize due to IV  5. Below AC 5.7889mm    6. Mid forearm 3.3279mm    7. Wrist   Thrombosed                  Basilic Unable to visualize.   Left Upper Extremity Vein Map    Cephalic  Segment Diameter Depth Comment  1. Axilla 2.6383mm    2. Mid upper arm 1.1141mm    3. Above AC 2.6919mm    4. In AC 2.1610mm    5. Below AC 2.5892mm  Branch  6. Mid forearm 3.4133mm    7. Wrist 2.3405mm                     Basilic  Segment Diameter Depth Comment  1. Axilla 4.3679mm 9.5681mm Branch  2. Mid upper arm 3.7433mm 10.153mm   3. Above AC 3.1410mm 8.2058mm   4. In [facility] 3.6356mm 8.3595mm   5. Below AC 3.6324mm 18.328mm   6. Mid forearm 2.2151mm 22.334mm Multiple branches  7. Wrist   Unable to visualize                   09/03/2014 4:13 PM Gertie FeyMichelle Ab Leaming, RVT, RDCS, RDMS

## 2014-09-04 ENCOUNTER — Encounter (HOSPITAL_COMMUNITY)
Admission: EM | Disposition: A | Discharge status: Home Health | Payer: Self-pay | Source: Home / Self Care | Attending: Internal Medicine

## 2014-09-04 ENCOUNTER — Inpatient Hospital Stay (HOSPITAL_COMMUNITY): Payer: Medicare Other

## 2014-09-04 LAB — TYPE AND SCREEN
ABO/RH(D): B POS
ANTIBODY SCREEN: NEGATIVE
Unit division: 0
Unit division: 0

## 2014-09-04 LAB — CBC
HCT: 30.5 % — ABNORMAL LOW (ref 36.0–46.0)
Hemoglobin: 10 g/dL — ABNORMAL LOW (ref 12.0–15.0)
MCH: 26.7 pg (ref 26.0–34.0)
MCHC: 32.8 g/dL (ref 30.0–36.0)
MCV: 81.6 fL (ref 78.0–100.0)
Platelets: 226 10*3/uL (ref 150–400)
RBC: 3.74 MIL/uL — ABNORMAL LOW (ref 3.87–5.11)
RDW: 19 % — ABNORMAL HIGH (ref 11.5–15.5)
WBC: 19 10*3/uL — ABNORMAL HIGH (ref 4.0–10.5)

## 2014-09-04 LAB — BLOOD GAS, ARTERIAL
Acid-base deficit: 5.2 mmol/L — ABNORMAL HIGH (ref 0.0–2.0)
Bicarbonate: 20 mEq/L (ref 20.0–24.0)
Drawn by: 36277
FIO2: 100 %
O2 Saturation: 96.7 %
PH ART: 7.311 — AB (ref 7.350–7.450)
Patient temperature: 98.6
TCO2: 21.2 mmol/L (ref 0–100)
pCO2 arterial: 40.8 mmHg (ref 35.0–45.0)
pO2, Arterial: 96.3 mmHg (ref 80.0–100.0)

## 2014-09-04 LAB — GLUCOSE, CAPILLARY
GLUCOSE-CAPILLARY: 209 mg/dL — AB (ref 70–99)
GLUCOSE-CAPILLARY: 137 mg/dL — AB (ref 70–99)
GLUCOSE-CAPILLARY: 92 mg/dL (ref 70–99)
Glucose-Capillary: 170 mg/dL — ABNORMAL HIGH (ref 70–99)

## 2014-09-04 LAB — BASIC METABOLIC PANEL
ANION GAP: 12 (ref 5–15)
BUN: 101 mg/dL — AB (ref 6–23)
CO2: 23 mmol/L (ref 19–32)
Calcium: 8.7 mg/dL (ref 8.4–10.5)
Chloride: 102 mmol/L (ref 96–112)
Creatinine, Ser: 9.11 mg/dL — ABNORMAL HIGH (ref 0.50–1.10)
GFR calc Af Amer: 4 mL/min — ABNORMAL LOW (ref >90)
GFR, EST NON AFRICAN AMERICAN: 4 mL/min — AB (ref >90)
GLUCOSE: 172 mg/dL — AB (ref 70–99)
POTASSIUM: 4.6 mmol/L (ref 3.5–5.1)
Sodium: 137 mmol/L (ref 135–145)

## 2014-09-04 LAB — PROTIME-INR
INR: 1.06 (ref 0.00–1.49)
Prothrombin Time: 13.9 seconds (ref 11.6–15.2)

## 2014-09-04 LAB — HEPATITIS B SURFACE ANTIGEN: Hepatitis B Surface Ag: NEGATIVE

## 2014-09-04 SURGERY — ARTERIOVENOUS (AV) FISTULA CREATION
Anesthesia: Monitor Anesthesia Care

## 2014-09-04 MED ORDER — ALTEPLASE 2 MG IJ SOLR
2.0000 mg | Freq: Once | INTRAMUSCULAR | Status: DC | PRN
  Filled 2014-09-04: qty 2

## 2014-09-04 MED ORDER — BENAZEPRIL HCL 20 MG PO TABS
20.0000 mg | ORAL_TABLET | Freq: Every day | ORAL | Status: DC
  Administered 2014-09-04: 20 mg via ORAL
  Filled 2014-09-04 (×2): qty 1

## 2014-09-04 MED ORDER — NEPRO/CARBSTEADY PO LIQD: 237.0000 mL | ORAL | Status: DC | PRN

## 2014-09-04 MED ORDER — IPRATROPIUM-ALBUTEROL 0.5-2.5 (3) MG/3ML IN SOLN
3.0000 mL | RESPIRATORY_TRACT | Status: DC | PRN
  Administered 2014-09-04: 3 mL via RESPIRATORY_TRACT
  Filled 2014-09-04: qty 3

## 2014-09-04 MED ORDER — HYDRALAZINE HCL 20 MG/ML IJ SOLN
10.0000 mg | Freq: Four times a day (QID) | INTRAMUSCULAR | Status: DC | PRN
  Administered 2014-09-04 (×2): 10 mg via INTRAVENOUS
  Administered 2014-09-05: 20 mg via INTRAVENOUS
  Filled 2014-09-04 (×3): qty 1

## 2014-09-04 MED ORDER — LIDOCAINE-PRILOCAINE 2.5-2.5 % EX CREA: 1.0000 "application " | TOPICAL_CREAM | CUTANEOUS | Status: DC | PRN

## 2014-09-04 MED ORDER — LIDOCAINE-PRILOCAINE 2.5-2.5 % EX CREA
1.0000 "application " | TOPICAL_CREAM | CUTANEOUS | Status: DC | PRN
  Filled 2014-09-04: qty 5

## 2014-09-04 MED ORDER — FUROSEMIDE 10 MG/ML IJ SOLN
200.0000 mg | Freq: Once | INTRAMUSCULAR | Status: AC
  Administered 2014-09-04: 200 mg via INTRAVENOUS
  Filled 2014-09-04: qty 20

## 2014-09-04 MED ORDER — FENTANYL CITRATE 0.05 MG/ML IJ SOLN
INTRAMUSCULAR | Status: AC
  Filled 2014-09-04: qty 5

## 2014-09-04 MED ORDER — LIDOCAINE HCL (PF) 1 % IJ SOLN: 5.0000 mL | INTRAMUSCULAR | Status: DC | PRN

## 2014-09-04 MED ORDER — ALTEPLASE 2 MG IJ SOLR: 2.0000 mg | Freq: Once | INTRAMUSCULAR | Status: DC | PRN

## 2014-09-04 MED ORDER — CHLORHEXIDINE GLUCONATE 0.12 % MT SOLN: 15.0000 mL | Freq: Two times a day (BID) | OROMUCOSAL | Status: DC

## 2014-09-04 MED ORDER — SODIUM CHLORIDE 0.9 % IV SOLN: 100.0000 mL | INTRAVENOUS | Status: DC | PRN

## 2014-09-04 MED ORDER — PROPOFOL 10 MG/ML IV BOLUS
INTRAVENOUS | Status: AC
  Filled 2014-09-04: qty 20

## 2014-09-04 MED ORDER — HEPARIN SODIUM (PORCINE) 1000 UNIT/ML DIALYSIS: 1000.0000 [IU] | INTRAMUSCULAR | Status: DC | PRN

## 2014-09-04 MED ORDER — PENTAFLUOROPROP-TETRAFLUOROETH EX AERO: 1.0000 "application " | INHALATION_SPRAY | CUTANEOUS | Status: DC | PRN

## 2014-09-04 MED ORDER — HEPARIN SODIUM (PORCINE) 1000 UNIT/ML DIALYSIS: 1500.0000 [IU] | INTRAMUSCULAR | Status: DC | PRN

## 2014-09-04 MED ORDER — CETYLPYRIDINIUM CHLORIDE 0.05 % MT LIQD
7.0000 mL | Freq: Two times a day (BID) | OROMUCOSAL | Status: DC
  Administered 2014-09-04 – 2014-09-08 (×7): 7 mL via OROMUCOSAL

## 2014-09-04 MED ORDER — ALBUTEROL SULFATE (2.5 MG/3ML) 0.083% IN NEBU: 5.0000 mg | INHALATION_SOLUTION | Freq: Once | RESPIRATORY_TRACT | Status: DC

## 2014-09-04 MED ORDER — HEPARIN SODIUM (PORCINE) 1000 UNIT/ML DIALYSIS: 2000.0000 [IU] | INTRAMUSCULAR | Status: DC | PRN

## 2014-09-04 MED ORDER — ALBUTEROL SULFATE (2.5 MG/3ML) 0.083% IN NEBU
INHALATION_SOLUTION | RESPIRATORY_TRACT | Status: AC
  Administered 2014-09-04: 5 mg
  Filled 2014-09-04: qty 6

## 2014-09-04 MED ORDER — ALBUTEROL SULFATE (2.5 MG/3ML) 0.083% IN NEBU: 2.5000 mg | INHALATION_SOLUTION | RESPIRATORY_TRACT | Status: DC

## 2014-09-04 MED ORDER — CETYLPYRIDINIUM CHLORIDE 0.05 % MT LIQD
7.0000 mL | Freq: Two times a day (BID) | OROMUCOSAL | Status: DC
  Administered 2014-09-04: 7 mL via OROMUCOSAL

## 2014-09-04 SURGICAL SUPPLY — 55 items
ARMBAND PINK RESTRICT EXTREMIT (MISCELLANEOUS) ×4 IMPLANT
BAG DECANTER FOR FLEXI CONT (MISCELLANEOUS) ×4 IMPLANT
BENZOIN TINCTURE PRP APPL 2/3 (GAUZE/BANDAGES/DRESSINGS) ×4 IMPLANT
BIOPATCH RED 1 DISK 7.0 (GAUZE/BANDAGES/DRESSINGS) ×3 IMPLANT
BIOPATCH RED 1IN DISK 7.0MM (GAUZE/BANDAGES/DRESSINGS) ×1
BLADE SURG 10 STRL SS (BLADE) ×4 IMPLANT
CANISTER SUCTION 2500CC (MISCELLANEOUS) ×4 IMPLANT
CANNULA VESSEL 3MM 2 BLNT TIP (CANNULA) IMPLANT
CATH CANNON HEMO 15F 50CM (CATHETERS) IMPLANT
CATH CANNON HEMO 15FR 19 (HEMODIALYSIS SUPPLIES) IMPLANT
CATH CANNON HEMO 15FR 23CM (HEMODIALYSIS SUPPLIES) IMPLANT
CATH CANNON HEMO 15FR 31CM (HEMODIALYSIS SUPPLIES) IMPLANT
CATH CANNON HEMO 15FR 32CM (HEMODIALYSIS SUPPLIES) IMPLANT
CLIP LIGATING EXTRA MED SLVR (CLIP) ×4 IMPLANT
CLIP LIGATING EXTRA SM BLUE (MISCELLANEOUS) ×4 IMPLANT
CLOSURE WOUND 1/2 X4 (GAUZE/BANDAGES/DRESSINGS) ×1
COVER PROBE W GEL 5X96 (DRAPES) ×4 IMPLANT
COVER SURGICAL LIGHT HANDLE (MISCELLANEOUS) ×4 IMPLANT
DECANTER SPIKE VIAL GLASS SM (MISCELLANEOUS) ×4 IMPLANT
DRAPE C-ARM 42X72 X-RAY (DRAPES) ×4 IMPLANT
DRAPE CHEST BREAST 15X10 FENES (DRAPES) ×4 IMPLANT
ELECT REM PT RETURN 9FT ADLT (ELECTROSURGICAL) ×4
ELECTRODE REM PT RTRN 9FT ADLT (ELECTROSURGICAL) ×2 IMPLANT
GAUZE SPONGE 2X2 8PLY STRL LF (GAUZE/BANDAGES/DRESSINGS) ×2 IMPLANT
GAUZE SPONGE 4X4 12PLY STRL (GAUZE/BANDAGES/DRESSINGS) ×4 IMPLANT
GAUZE SPONGE 4X4 16PLY XRAY LF (GAUZE/BANDAGES/DRESSINGS) ×4 IMPLANT
GEL ULTRASOUND 20GR AQUASONIC (MISCELLANEOUS) IMPLANT
GLOVE SS BIOGEL STRL SZ 7.5 (GLOVE) ×2 IMPLANT
GLOVE SUPERSENSE BIOGEL SZ 7.5 (GLOVE) ×2
GOWN STRL REUS W/ TWL LRG LVL3 (GOWN DISPOSABLE) ×6 IMPLANT
GOWN STRL REUS W/TWL LRG LVL3 (GOWN DISPOSABLE) ×6
KIT BASIN OR (CUSTOM PROCEDURE TRAY) ×4 IMPLANT
KIT ROOM TURNOVER OR (KITS) ×4 IMPLANT
NEEDLE 18GX1X1/2 (RX/OR ONLY) (NEEDLE) ×4 IMPLANT
NEEDLE 22X1 1/2 (OR ONLY) (NEEDLE) ×4 IMPLANT
NEEDLE HYPO 25GX1X1/2 BEV (NEEDLE) ×4 IMPLANT
NS IRRIG 1000ML POUR BTL (IV SOLUTION) ×4 IMPLANT
PACK CV ACCESS (CUSTOM PROCEDURE TRAY) ×4 IMPLANT
PACK SURGICAL SETUP 50X90 (CUSTOM PROCEDURE TRAY) ×4 IMPLANT
PAD ARMBOARD 7.5X6 YLW CONV (MISCELLANEOUS) ×8 IMPLANT
PROBE PENCIL 8 MHZ STRL DISP (MISCELLANEOUS) IMPLANT
SOAP 2 % CHG 4 OZ (WOUND CARE) ×4 IMPLANT
SPONGE GAUZE 2X2 STER 10/PKG (GAUZE/BANDAGES/DRESSINGS) ×2
STRIP CLOSURE SKIN 1/2X4 (GAUZE/BANDAGES/DRESSINGS) ×3 IMPLANT
SUT ETHILON 3 0 PS 1 (SUTURE) ×4 IMPLANT
SUT PROLENE 6 0 CC (SUTURE) ×4 IMPLANT
SUT VIC AB 3-0 SH 27 (SUTURE) ×2
SUT VIC AB 3-0 SH 27X BRD (SUTURE) ×2 IMPLANT
SUT VICRYL 4-0 PS2 18IN ABS (SUTURE) ×4 IMPLANT
SYR 20CC LL (SYRINGE) ×4 IMPLANT
SYR 5ML LL (SYRINGE) ×8 IMPLANT
SYR CONTROL 10ML LL (SYRINGE) ×4 IMPLANT
SYRINGE 10CC LL (SYRINGE) ×4 IMPLANT
UNDERPAD 30X30 INCONTINENT (UNDERPADS AND DIAPERS) ×4 IMPLANT
WATER STERILE IRR 1000ML POUR (IV SOLUTION) ×4 IMPLANT

## 2014-09-04 NOTE — Progress Notes (Signed)
Event: Notified by RN at approx 0615 regarding pt w/ increased WOB w/ 02 sats in the mid 80's. Pt placed on 3L [place] and 02 sats increased to 92%. BBS w/ wheezes and crackles per RN. RR RN was paged and responded to bedside. An ABG was ordered by RR RN. Stat PCXR ordered. Requested renal service be contacted regarding acute change in pt's status. NP to bedside.  Subjective: Pt unable to provide significant information d/t increased WOB. Objective: [name] is an 81 y/o female admitted 08/31/14 after presenting to ED w/ h/o frequent falls and weakness. She also reported increasing SOB and wheezing that started approx 2 weeks prior to admission. Pt found to be in acute on CKD stage 4 vs progression of CKD to ESRD. Nephrology service consulted and it was ultimately decided pt would need HD. [facility] and then HD as soon as could be arranged. At bedside pt noted w/ significantly increased WOB. BBS very diminished w/ scattered wheezes through-out. RR-32-40. 02 sats 96% on NRB. BP-172/111, HR 98. Pt remains afebrile. Pt is alert and oriented to self. ABG reveals pH-7.31, pC02-40.8, p02-96.3 and bicarb of 20.0. Stat PCXR confirms progression of pulmonary edema. Pt placed on BiPAP at bedside. Dr Marisue HumbleSanford was notified and requested pt be given Lasix 200 mg IV.  Assessment/Plan: 1. Acute respiratory failure w/ hypoxia: In setting of pt w/ renal failure and worsening pulmonary edema. Possibly some underlying COPD as pt was a smoker. Dr Marisue HumbleSanford w/ renal service has placed plan for cath placement in OR on hold. Pt to receive IV lasix. Will transfer pt to SDU for continued ventilatory support w/ BiPAP. Discussed pt w/ Dr Gwenlyn PerkingMadera who agrees w/ plan and will see pt in SDU. Will continue to monitor closely in SDU.   Leanne ChangKatherine P. Joyia Riehle, NP-C [facility] Pager [phone]

## 2014-09-04 NOTE — Progress Notes (Signed)
0600-Respiratory therapist went into room to give pt prn breathing treatment. Pt stated she needed a breathing treatment.  Pt having bilateral wheezing and difficulty breathing. Pt's O2 sat is in the 80's on 3 L of O2 per respiratory therapist. RN went into room to assess pt. Notified Schorr, NP of pt's condition via text page and respiratory therapist request of ABG. 2 respiratory therapists at bedside. Pt having bilateral wheezing. Respiratory placed pt on non breather mask. Pt's O2 sat is 100% on nonrebreather mask. Called Brooke, rapid response RN to come assess pt. Paged Schorr, NP x2 via text page. Brooke, rapid response RN arrived to pt's room. Schorr, NP returned page at [phone]. Notified Schorr, NP of pt's condition and pt's BP is 172/111. Pt is A&Ox4. Pt states that she feels better and pt's breathing has become more easier on nonrebreather. Schorr, NP stated to call nephrology since pt is suppose to be getting graft placement and dialysis today.  Notified Dr. Marisue HumbleSanford (nephrologist on call) of pt's condition and that OR has called stating they will come to get pt around 710 this morning. Dr. Marisue HumbleSanford gave verbal order for Lasix 200mg  IV and stated to wait until morning nephrologist came to assess pt this morning to send pt to OR.  ABG results were WNL. Called OR to tell them that Dr. Marisue HumbleSanford stated to hold off on surgery this morning. OR stated that they would send Dr. Arbie CookeyEarly to see pt this morning. [phone] Schorr, NP came to pt's room to assess pt. Respiratory therapist placed pt on bipap. Portable stat chest xray done. Called report to [place] RN on 2S. Brooke, rapid response RN and respiratory therapist transferred pt with belongings to 2S07 at [phone]. Told OR that pt is in room 2S07 now. Jerrye BushyJenny Thacker,RN

## 2014-09-04 NOTE — Progress Notes (Signed)
RT Note:  Called to room to give PRN treatment at approximately 5:50.  Upon arrival to room, patient sitting on side of bed, increased WOB, SOB, using  accessory muscles.  Nebulizer tx given with no relief, made RN aware, Rapid Response called, ABG done, 5 mg Albuterol given, Pt. Placed on BIPAP and transferred to 2S07.

## 2014-09-04 NOTE — Progress Notes (Signed)
Called to room at 0607 per floor Rn for patient in respiratory distress. Found at 0530 on 3 LNC with 02 sats 80%. Neb treatment given per RT and NRB placed. Upon my arrival at 0610 patient sats 96% on NRB however rr 30-40 with audible wheezing. Lung sounds diminished, expiratory wheezes and crackles in bilateral upper lobes. ABG order placed and done stat. [facility] NP paged regarding patient status. Nephrology called to update on patient status, lasix 200 mg ordered IVPB and this morning procedure to be put on hold until Nephrology can see patient at bedside. At rest patient rr 20-30 with less wheezing. Patient with very little reserve, after turning for stool cleanup patient in worse distress, using accessory muscles. [facility] NP called to bedside to assess. SD bed ordered for Bipap. Bipap placed on patient in room for transport, lasix infusion started. [place] to [place], care assumed by  2 [place] RN.

## 2014-09-04 NOTE — Progress Notes (Addendum)
Patient transported on BiPAP using Servo-i to HD. Pt tolerated well. SPO2 100%. No distress noted.

## 2014-09-04 NOTE — Progress Notes (Signed)
  [facility] Progress Note   Subjective: decomp overnight w SOB , pulm edema after blood transfusion  Filed Vitals:   09/04/14 0730 09/04/14 0745 09/04/14 0759 09/04/14 0800  BP:  159/71 159/71   Pulse: 110 111 108   Temp:    97.1 F (36.2 C)  TempSrc:    Axillary  Resp: 25 22 27    Height:      Weight:      SpO2: 100% 100%     Exam: On BiPAP, resting Mild JVD Chest some coarse rales bilat RRR no MRG Abd soft, NTND, no ascites or HSM No LE edema or UE edema Neuro is Ox 3, nf, no asterixis  CXR pulm edema worse UA 1.014, 7.0, 100 prot, 0-2 wbc, 3-6 rbc, rare epi's Renal US - R kidney 7 cm, ^'d echo, no hydro, renal atrophy; L kidney 9.1 cm w ^'d echo, no hydro, renal atrophy   Assessment: 1. Renal failure - severe, nonoliguric. HD today 2. Dyspnea - recurrent, vol excess 3. DM 2 long standing 4. HTN on norvasc 5. Anemia Hb 7.1, prob d/t CKD; start ESA and transfuse with first HD if needed 6. Hyperkalemia - changed to renal diet, improved  Plan - HD this am and again tomorrow, have d/w VVS , they will postpone perm access/ TDC to later this week    Vinson Moselleob Ilan Kahrs MD  pager [phone]    cell [phone]  09/04/2014, 8:41 AM     Recent Labs Lab 09/02/14 0618 09/03/14 0610 09/04/14 0624  NA 138 139 137  K 5.1 4.6 4.6  CL 102 104 102  CO2 20 19 23   GLUCOSE 308* 129* 172*  BUN 95* 99* 101*  CREATININE 9.31* 9.40* 9.11*  CALCIUM 8.8 8.7 8.7    Recent Labs Lab 08/29/14 0842 08/31/14 0633 09/01/14 0442  AST 14 21 28   ALT 5 10 12   ALKPHOS 92 75 78  BILITOT 0.3 0.5 0.3  PROT 6.8 6.7 6.2  ALBUMIN  --  3.2* 3.0*    Recent Labs Lab 08/29/14 0842 08/31/14 0633  09/02/14 0618 09/03/14 0610 09/04/14 0624  WBC 8.5 6.1  < > 6.4 11.7* 19.0*  NEUTROABS 7.2* 5.6  --   --   --   --   HGB 9.3* 8.2*  < > 7.1* 7.0* 10.0*  HCT 28.6* 24.8*  < > 21.7* 21.6* 30.5*  MCV 77* 76.1*  < > 78.6 79.7 81.6  PLT 212 179  < > 181 188 226  < > = values in  this interval not displayed. Marland Kitchen. amLODipine  5 mg Oral Daily  .  ceFAZolin (ANCEF) IV  2 g Intravenous 60 min Pre-Op  . [START ON 09/11/2014] darbepoetin (ARANESP) injection - DIALYSIS  40 mcg Intravenous Q Wed-HD  . ferric gluconate (FERRLECIT/NULECIT) IV  125 mg Intravenous Q M,W,F-HD  . heparin  5,000 Units Subcutaneous 3 times per day  . insulin aspart  0-5 Units Subcutaneous QHS  . insulin aspart  0-9 Units Subcutaneous TID WC     sodium chloride, sodium chloride, acetaminophen **OR** acetaminophen, alteplase, feeding supplement (NEPRO CARB STEADY), heparin, [START ON 09/05/2014] heparin, ipratropium-albuterol, lidocaine (PF), lidocaine-prilocaine, ondansetron **OR** ondansetron (ZOFRAN) IV, pentafluoroprop-tetrafluoroeth

## 2014-09-04 NOTE — Progress Notes (Signed)
PROGRESS NOTE  Natalie ReaderGeorgianna Wolfe ZOX:[id number] DOB: [birth year] DOA: 08/31/2014 PCP: [name], [name], MD  HPI/Recap of past 24 hours: Patient with worsening breathing and acute resp distress. Required to be moved into stepdown and is on BIPAP. Able to follow commands and with improve in her breathing while on BIPAP.  Assessment/Plan: Active Problems:   Essential hypertension   Anemia of chronic disease   Acute renal failure   AKI on CKD- renal U/S, medical renal disease, no obstruction.  -renal consulted, plan is to start HD -given worsening in her breathing; plan is for emergent HD catheter placement and HD tx today -most likely on 1/29 if stable, permanent catheter and fistula would be place -nephrology and vascular surgery on board; will follow rec's  Hyperkalemia:Does has mild hyperkalemia, in the setting of dehydration and AKI on CKD,  -no abnormalities on EKG or telemetry -will have HD today  Pleural effusion and congestion. -plan is for HD today -echocardiogram showing diastolic heart failure  Hypoglycemia:  Due to poor PO intake in setting of worsening renal failure Stable now Will monitor  Diarrhea?  cdiff negative -will monitor -received kayexalate on admission  HTN- continue IV PRN -will adjust PO meds once we see effect from HD  Anemia: microcystic. Anemia work up in progress, no indication for transfusion. No overt bleed. ESA started by nephrology  DM- SSI, monitor carefully as AKI- has been hypoglycemic  Code Status: full  Family Communication: patient  Disposition Plan: remain inpatient   Consultants:  Nephrology/vascular surgery  Procedures:  none  Antibiotics:   Rocephin/zithrox1 on 1/24  Flagylx1 on 1/24   Objective: BP 193/101 mmHg  Pulse 103  Temp(Src) 97.5 F (36.4 C) (Axillary)  Resp 22  Ht 5\' 5"  (1.651 m)  Wt 70.8 kg (156 lb 1.4 oz)  BMI 25.97 kg/m2  SpO2 100%  Intake/Output Summary (Last 24 hours) at 09/04/14  1301 Last data filed at 09/04/14 0700  Gross per 24 hour  Intake    370 ml  Output    925 ml  Net   -555 ml   Filed Weights   09/03/14 0639 09/04/14 0531 09/04/14 0945  Weight: 66.089 kg (145 lb 11.2 oz) 65.8 kg (145 lb 1 oz) 70.8 kg (156 lb 1.4 oz)    Exam:   General:  On BIPAP mild resp distress; no fever  Cardiovascular: rrr, no rubs or gallops  Respiratory: positive rales , diminished at bases; scattered rhonchi  Abdomen: soft/NT/ND, positive bs.  Musculoskeletal: no edema  Data Reviewed: Basic Metabolic Panel:  Recent Labs Lab 09/01/14 0957 09/01/14 1651 09/02/14 0618 09/03/14 0610 09/04/14 0624  NA 133* 132* 138 139 137  K 5.8* 6.0* 5.1 4.6 4.6  CL 105 101 102 104 102  CO2 16* 17* 20 19 23   GLUCOSE 134* 331* 308* 129* 172*  BUN 98* 96* 95* 99* 101*  CREATININE 10.34* 9.81* 9.31* 9.40* 9.11*  CALCIUM 8.4 8.2* 8.8 8.7 8.7   Liver Function Tests:  Recent Labs Lab 08/29/14 0842 08/31/14 0633 09/01/14 0442  AST 14 21 28   ALT 5 10 12   ALKPHOS 92 75 78  BILITOT 0.3 0.5 0.3  PROT 6.8 6.7 6.2  ALBUMIN  --  3.2* 3.0*   No results for input(s): LIPASE, AMYLASE in the last 168 hours. No results for input(s): AMMONIA in the last 168 hours. CBC:  Recent Labs Lab 08/29/14 0842 08/31/14 [id number] 09/01/14 0442 09/01/14 0957 09/02/14 0618 09/03/14 0610 09/04/14 0624  WBC 8.5 6.1  7.5  --  6.4 11.7* 19.0*  NEUTROABS 7.2* 5.6  --   --   --   --   --   HGB 9.3* 8.2* 8.0*  --  7.1* 7.0* 10.0*  HCT 28.6* 24.8* 24.5* 23.3* 21.7* 21.6* 30.5*  MCV 77* 76.1* 76.8*  --  78.6 79.7 81.6  PLT 212 179 197  --  181 188 226   Cardiac Enzymes:    Recent Labs Lab 09/01/14 0957  CKTOTAL 155  CKMB 6.4*   BNP (last 3 results) No results for input(s): PROBNP in the last 8760 hours. CBG:  Recent Labs Lab 09/03/14 1206 09/03/14 1718 09/03/14 2224 09/04/14 0658 09/04/14 0816  GLUCAP 130* 90 151* 170* 209*    Recent Results (from the past 240 hour(s))  MRSA  PCR Screening     Status: None   Collection Time: 09/01/14  3:00 AM  Result Value Ref Range Status   MRSA by PCR NEGATIVE NEGATIVE Final    Comment:        The GeneXpert MRSA Assay (FDA approved for NASAL specimens only), is one component of a comprehensive MRSA colonization surveillance program. It is not intended to diagnose MRSA infection nor to guide or monitor treatment for MRSA infections.   Clostridium Difficile by PCR     Status: None   Collection Time: 09/01/14 12:30 PM  Result Value Ref Range Status   C difficile by pcr NEGATIVE NEGATIVE Final  Surgical pcr screen     Status: None   Collection Time: 09/03/14  8:27 PM  Result Value Ref Range Status   MRSA, PCR NEGATIVE NEGATIVE Final   Staphylococcus aureus NEGATIVE NEGATIVE Final    Comment:        The Xpert SA Assay (FDA approved for NASAL specimens in patients over 35 years of age), is one component of a comprehensive surveillance program.  Test performance has been validated by [facility] for patients greater than or equal to 76 year old. It is not intended to diagnose infection nor to guide or monitor treatment.      Studies: Dg Chest 2 View  09/03/2014   CLINICAL DATA:  81 year old female with shortness breath for 2-3 days.  EXAM: CHEST  2 VIEW  COMPARISON:  Chest x-ray 09/01/2014.  FINDINGS: There is cephalization of the pulmonary vasculature and slight indistinctness of the interstitial markings suggestive of mild pulmonary edema. Small left pleural effusion. Opacity in the medial aspect of the left lung base may reflect atelectasis and/or consolidation. Heart size appears normal. Upper mediastinal contours are within normal limits. Atherosclerosis in the thoracic aorta.  IMPRESSION: 1. There appears to be mild interstitial pulmonary edema. Given the normal heart size, clinical evaluation for cause of noncardiogenic pulmonary edema suggested. 2. Small left pleural effusion. 3. Developing opacity in the  medial aspect of the left lung base may reflect underlying atelectasis, or airspace consolidation from infection or aspiration. 4. Atherosclerosis.   Electronically Signed   By: Trudie Reed M.D.   On: 09/03/2014 13:33    Scheduled Meds: . amLODipine  5 mg Oral Daily  .  ceFAZolin (ANCEF) IV  2 g Intravenous 60 min Pre-Op  . [START ON 09/11/2014] darbepoetin (ARANESP) injection - DIALYSIS  40 mcg Intravenous Q Wed-HD  . ferric gluconate (FERRLECIT/NULECIT) IV  125 mg Intravenous Q M,W,F-HD  . heparin  5,000 Units Subcutaneous 3 times per day  . insulin aspart  0-5 Units Subcutaneous QHS  . insulin aspart  0-9 Units Subcutaneous  TID WC    Continuous Infusions: no    Time: 30 minutes  Vassie Loll  [facility] Pager [phone]. If 7PM-7AM, please contact night-coverage at [email or web address], password [facility] 09/04/2014, 1:01 PM  LOS: 4 days

## 2014-09-04 NOTE — Procedures (Signed)
Under sterile conditions and using US guidance a R femoral 20 cm Trialysis temp HD catheter was placed w/o complications. Indication CKD V w reps distress.   Vinson Moselleob Josepha Barbier MD (pgr) [phone]    ([phone]) [phone] 09/04/2014, 9:49 AM

## 2014-09-04 NOTE — Progress Notes (Signed)
Patient ID: Natalie Wolfe, female   DOB: 05/29/1930, 81 y.o.   MRN: [id number] Events of this morning noted. Patient had significant respiratory distress. Currently in the intensive care unit on BiPAP. He does answer questions. I explained that we would not plan on elective access placement today. Discussed with Dr.Shertz.  She will have a urgent hemodialysis via temporary catheter today. We will plan tunneled catheter and probable graft placement on Friday if she remains stable. We'll continue to follow with you. 

## 2014-09-04 NOTE — Procedures (Signed)
I was present at this dialysis session, have reviewed the session itself and made  appropriate changes  Vinson Moselleob Audreena Sachdeva MD (pgr) [phone]    ([phone]) [phone] 09/04/2014, 9:50 AM

## 2014-09-04 NOTE — Progress Notes (Signed)
Pt transported to 2s07 from 5w on bipap without complications.

## 2014-09-04 NOTE — Progress Notes (Signed)
eLink Physician-Brief Progress Note Patient Name: Natalie Wolfe DOB: 18-Jul-1930 MRN: [id number]   Date of Service  09/04/2014  HPI/Events of Note  HTN in HD patient  eICU Interventions  Added back home benazepril to amlodipine, started hydralazine prn     Intervention Category Intermediate Interventions: Hypertension - evaluation and management  Ramiyah Mcclenahan S. 09/04/2014, 8:20 PM

## 2014-09-04 NOTE — Progress Notes (Signed)
Pt transported from HD back to 2S07. No complications noted. Pt placed on 3 LPM nasal cannula. No distress noted. BBS clear, diminished. Pt states she feels better. RT will continue to monitor.

## 2014-09-05 DIAGNOSIS — N179 Acute kidney failure, unspecified: Secondary | ICD-10-CM | POA: Insufficient documentation

## 2014-09-05 DIAGNOSIS — R0602 Shortness of breath: Secondary | ICD-10-CM | POA: Insufficient documentation

## 2014-09-05 LAB — RENAL FUNCTION PANEL
Albumin: 2.6 g/dL — ABNORMAL LOW (ref 3.5–5.2)
Anion gap: 15 (ref 5–15)
BUN: 56 mg/dL — AB (ref 6–23)
CALCIUM: 8.7 mg/dL (ref 8.4–10.5)
CO2: 21 mmol/L (ref 19–32)
Chloride: 103 mmol/L (ref 96–112)
Creatinine, Ser: 7 mg/dL — ABNORMAL HIGH (ref 0.50–1.10)
GFR, EST AFRICAN AMERICAN: 6 mL/min — AB (ref >90)
GFR, EST NON AFRICAN AMERICAN: 5 mL/min — AB (ref >90)
Glucose, Bld: 97 mg/dL (ref 70–99)
PHOSPHORUS: 6.2 mg/dL — AB (ref 2.3–4.6)
Potassium: 4.4 mmol/L (ref 3.5–5.1)
Sodium: 139 mmol/L (ref 135–145)

## 2014-09-05 LAB — GLUCOSE, CAPILLARY
GLUCOSE-CAPILLARY: 74 mg/dL (ref 70–99)
Glucose-Capillary: 90 mg/dL (ref 70–99)
Glucose-Capillary: 125 mg/dL — ABNORMAL HIGH (ref 70–99)

## 2014-09-05 LAB — UIFE/LIGHT CHAINS/TP QN, 24-HR UR
ALPHA 1 UR: DETECTED — AB
Albumin, U: DETECTED
Alpha 2, Urine: DETECTED — AB
Beta, Urine: DETECTED — AB
GAMMA UR: DETECTED — AB
Total Protein, Urine: 247 mg/dL — ABNORMAL HIGH (ref 5–24)

## 2014-09-05 LAB — CBC
HEMATOCRIT: 28.6 % — AB (ref 36.0–46.0)
HEMOGLOBIN: 9.3 g/dL — AB (ref 12.0–15.0)
MCH: 26.5 pg (ref 26.0–34.0)
MCHC: 32.5 g/dL (ref 30.0–36.0)
MCV: 81.5 fL (ref 78.0–100.0)
Platelets: 239 10*3/uL (ref 150–400)
RBC: 3.51 MIL/uL — ABNORMAL LOW (ref 3.87–5.11)
RDW: 19.4 % — ABNORMAL HIGH (ref 11.5–15.5)
WBC: 12.7 10*3/uL — ABNORMAL HIGH (ref 4.0–10.5)

## 2014-09-05 LAB — HEPATITIS B CORE ANTIBODY, TOTAL: HEP B C TOTAL AB: NEGATIVE — AB

## 2014-09-05 LAB — HEPATITIS B SURFACE ANTIBODY,QUALITATIVE: Hep B S Ab: NONREACTIVE — AB

## 2014-09-05 MED ORDER — LABETALOL HCL 5 MG/ML IV SOLN
INTRAVENOUS | Status: AC
  Filled 2014-09-05: qty 4

## 2014-09-05 MED ORDER — LABETALOL HCL 200 MG PO TABS
200.0000 mg | ORAL_TABLET | Freq: Three times a day (TID) | ORAL | Status: DC
  Administered 2014-09-05 – 2014-09-06 (×4): 200 mg via ORAL
  Filled 2014-09-05 (×9): qty 1

## 2014-09-05 MED ORDER — CEFAZOLIN SODIUM-DEXTROSE 2-3 GM-% IV SOLR
2.0000 g | INTRAVENOUS | Status: AC
  Administered 2014-09-06: 2 g via INTRAVENOUS
  Filled 2014-09-05: qty 50

## 2014-09-05 MED ORDER — ISOSORB DINITRATE-HYDRALAZINE 20-37.5 MG PO TABS
1.0000 | ORAL_TABLET | Freq: Two times a day (BID) | ORAL | Status: DC
  Administered 2014-09-05 – 2014-09-06 (×2): 1 via ORAL
  Filled 2014-09-05 (×6): qty 1

## 2014-09-05 MED ORDER — RENA-VITE PO TABS
1.0000 | ORAL_TABLET | Freq: Every day | ORAL | Status: DC
  Administered 2014-09-05 – 2014-09-08 (×4): 1 via ORAL
  Filled 2014-09-05 (×6): qty 1

## 2014-09-05 MED ORDER — HYDRALAZINE HCL 20 MG/ML IJ SOLN: 10.0000 mg | Freq: Four times a day (QID) | INTRAMUSCULAR | Status: DC | PRN

## 2014-09-05 MED ORDER — ROSUVASTATIN CALCIUM 20 MG PO TABS
20.0000 mg | ORAL_TABLET | Freq: Every day | ORAL | Status: DC
  Administered 2014-09-05 – 2014-09-08 (×4): 20 mg via ORAL
  Filled 2014-09-05 (×5): qty 1

## 2014-09-05 MED ORDER — LABETALOL HCL 5 MG/ML IV SOLN
10.0000 mg | Freq: Once | INTRAVENOUS | Status: AC
Start: 2014-09-05 — End: 2014-09-05
  Administered 2014-09-05: 10 mg via INTRAVENOUS

## 2014-09-05 NOTE — Progress Notes (Signed)
eLink Physician-Brief Progress Note Patient Name: Natalie Wolfe DOB: 10/01/1929 MRN: [id number]   Date of Service  09/05/2014  HPI/Events of Note  Blood pressure remains elevated.  eICU Interventions  Will give labetalol 10 mg IV x one.     Intervention Category Major Interventions: Other:  Tyanne Derocher 09/05/2014, 2:07 AM

## 2014-09-05 NOTE — Progress Notes (Signed)
PROGRESS NOTE  Amethyst Gainer IHK:[id number] DOB: 10-10-1929 DOA: 08/31/2014 PCP: [name], MD  HPI/Recap of past 24 hours: Patient breathing better and currently without complaints of chest pain, nausea, vomiting, abdominal pain, diaphoresis or palpitations. Seen during hemodialysis treatment so far well tolerated.   Assessment/Plan: Active Problems:   Essential hypertension   Anemia of chronic disease   Acute renal failure   AKI on CKD- renal U/S, medical renal disease, no obstruction.  -renal consulted, started on HD 09/04/14 -Patient breathing has improved dramatically since hemodialysis and currently is on just 2 L nasal cannula with 97-99% oxygen saturation -Plan is for permanent catheter and fistula/graft to be place most likely on 1/29 if patient remains stable -nephrology and vascular surgery on board; will follow their rec's  Hyperkalemia: Does has mild hyperkalemia, in the setting of dehydration and AKI on CKD,  -no abnormalities on EKG or telemetry -Potassium within normal limits after hemodialysis has been initiated  Pleural effusion and congestion. -plan is to continue HD  -echocardiogram showing diastolic heart failure; but there is not wall motion abnormalities  Hypoglycemia:  Due to poor PO intake in setting of worsening renal failure Stable now and without any further episodes of hypoglycemia Will monitor closely and adjust hypoglycemic regimen as needed  Diarrhea?   -c.diff negative -will  continue to monitor -No further episodes of diarrhea at this moment -received kayexalate on admission  HTN- continue IV PRN -will continue the use of labetalol 3 times a day and BiDil twice a day -Further adjustment to be done as needed based on blood pressure fluctuation  Anemia: microcystic. Anemia work up in progress, no indication for transfusion. No overt bleed.  -Received 2 units of PRBCs on 09/04/2014 during dialysis -IV iron and ESA started by  nephrology -Hemoglobin stable at 9.3 currently  DM- SSI, monitor carefully as AKI- -no further hypoglycemic event  Code Status: full  Family Communication: patient at bedside; no family available during examination  Disposition Plan: remain inpatient; will transfer to MedSurg bed   Consultants:  Nephrology/vascular surgery  Procedures:  none  Antibiotics:   Rocephin/zithrox1 on 1/24  Flagylx1 on 1/24   Objective: BP 137/66 mmHg  Pulse 101  Temp(Src) 97.6 F (36.4 C) (Oral)  Resp 23  Ht  (1.651 m)  Wt 62.9 kg (138 lb 10.7 oz)  BMI 23.08 kg/m2  SpO2 99%  Intake/Output Summary (Last 24 hours) at 09/05/14 1456 Last data filed at 09/05/14 1044  Gross per 24 hour  Intake     50 ml  Output   2800 ml  Net  -2750 ml   Filed Weights   09/04/14 1223 09/05/14 0726 09/05/14 1044  Weight: 66.5 kg (146 lb 9.7 oz) 65.4 kg (144 lb 2.9 oz) 62.9 kg (138 lb 10.7 oz)    Exam:   General:  No fever, denies chest pain, significant shortness of breath, nausea, vomiting or any other acute complaints.  Cardiovascular: rrr, no rubs or gallops  Respiratory: Fine rales at bases bilaterally, improved air movement; scattered rhonchi  Abdomen: soft/NT/ND, positive bs.  Musculoskeletal: no edema  Data Reviewed: Basic Metabolic Panel:  Recent Labs Lab 09/01/14 1651 09/02/14 0618 09/03/14 0610 09/04/14 0624 09/05/14 0746  NA 132* 138 139 137 139  K 6.0* 5.1 4.6 4.6 4.4  CL 101 102 104 102 103  CO2 17* GLUCOSE 331* 308* 129* 172* 97  BUN 96* 95* 99* 101* 56*  CREATININE 9.81* 9.31* 9.40*  9.11* 7.00*  CALCIUM 8.2* 8.8 8.7 8.7 8.7  PHOS  --   --   --   --  6.2*   Liver Function Tests:  Recent Labs Lab 08/31/14 0633 09/01/14 0442 09/05/14 0746  AST 21 28  --   ALT 10 12  --   ALKPHOS 75 78  --   BILITOT 0.5 0.3  --   PROT 6.7 6.2  --   ALBUMIN 3.2* 3.0* 2.6*   CBC:  Recent Labs Lab 08/31/14 1610 09/01/14 0442 09/01/14 0957  09/02/14 0618 09/03/14 0610 09/04/14 0624 09/05/14 0746  WBC 6.1 7.5  --  6.4 11.7* 19.0* 12.7*  NEUTROABS 5.6  --   --   --   --   --   --   HGB 8.2* 8.0*  --  7.1* 7.0* 10.0* 9.3*  HCT 24.8* 24.5* 23.3* 21.7* 21.6* 30.5* 28.6*  MCV 76.1* 76.8*  --  78.6 79.7 81.6 81.5  PLT 179 197  --  181 188 226 239   Cardiac Enzymes:    Recent Labs Lab 09/01/14 0957  CKTOTAL 155  CKMB 6.4*   CBG:  Recent Labs Lab 09/04/14 0658 09/04/14 0816 09/04/14 1546 09/04/14 2132 09/05/14 1133  GLUCAP 170* 209* 137* 92 74    Recent Results (from the past 240 hour(s))  MRSA PCR Screening     Status: None   Collection Time: 09/01/14  3:00 AM  Result Value Ref Range Status   MRSA by PCR NEGATIVE NEGATIVE Final    Comment:        The GeneXpert MRSA Assay (FDA approved for NASAL specimens only), is one component of a comprehensive MRSA colonization surveillance program. It is not intended to diagnose MRSA infection nor to guide or monitor treatment for MRSA infections.   Clostridium Difficile by PCR     Status: None   Collection Time: 09/01/14 12:30 PM  Result Value Ref Range Status   C difficile by pcr NEGATIVE NEGATIVE Final  Surgical pcr screen     Status: None   Collection Time: 09/03/14  8:27 PM  Result Value Ref Range Status   MRSA, PCR NEGATIVE NEGATIVE Final   Staphylococcus aureus NEGATIVE NEGATIVE Final    Comment:        The Xpert SA Assay (FDA approved for NASAL specimens in patients over 64 years of age), is one component of a comprehensive surveillance program.  Test performance has been validated by [facility] for patients greater than or equal to 31 year old. It is not intended to diagnose infection nor to guide or monitor treatment.      Studies: Dg Chest Port 1 View  09/04/2014   CLINICAL DATA:  81 year old female with shortness of Breath. Initial encounter.  EXAM: PORTABLE CHEST - 1 VIEW  COMPARISON:  09/03/2014 and earlier.  FINDINGS: Portable AP semi  upright view at 0702 hrs. Increased basilar predominant interstitial opacity and obscuration of the diaphragm. Increased veiling opacity at both lung bases. Stable cardiac size and mediastinal contours. No pneumothorax. Visualized tracheal air column is within normal limits.  IMPRESSION: Progressed pulmonary edema, bilateral pleural effusions, and bibasilar atelectasis.   Electronically Signed   By: Augusto Gamble M.D.   On: 09/04/2014 07:21    Scheduled Meds: . amLODipine  5 mg Oral Daily  . antiseptic oral rinse  7 mL Mouth Rinse BID  . [START ON 09/06/2014]  ceFAZolin (ANCEF) IV  2 g Intravenous On Call to OR  . [START ON  09/11/2014] darbepoetin (ARANESP) injection - DIALYSIS  40 mcg Intravenous Q Wed-HD  . ferric gluconate (FERRLECIT/NULECIT) IV  125 mg Intravenous Q M,W,F-HD  . heparin  5,000 Units Subcutaneous 3 times per day  . insulin aspart  0-5 Units Subcutaneous QHS  . insulin aspart  0-9 Units Subcutaneous TID WC  . isosorbide-hydrALAZINE  1 tablet Oral BID  . labetalol  200 mg Oral TID  . rosuvastatin  20 mg Oral q1800    Continuous Infusions: no    Time: 30 minutes  Vassie LollMadera, Gabriell Casimir  [facility] Pager [phone]. If 7PM-7AM, please contact night-coverage at [email or web address], password [email or web address] 09/05/2014, 2:56 PM  LOS: 5 days

## 2014-09-05 NOTE — Progress Notes (Signed)
Subjective:  tolerated hd today , no sob, ha or cp  Objective Vital signs in last 24 hours: Filed Vitals:   09/05/14 1100 09/05/14 1135 09/05/14 1200 09/05/14 1300  BP: 145/65  155/62 165/68  Pulse: 93  95 95  Temp:  97.6 F (36.4 C)    TempSrc:  Oral    Resp:   21 21  Height:      Weight:      SpO2: 100%  99% 100%   Weight change: 5 kg (11 lb 0.4 oz)  Physical Exam: General: Alert, calm ,and  Heart: RRR  1/6 sem lsb, no rub or gallop Lungs: scattered rhonchi and fine rales / non labored breathing  Abdomen:soft , nt, nd Extremities: no pedal edema  Dialysis Access: R Fem temp hd cath    Problem/Plan:  1. ESRD - HD started,  noted plans for perm cath and avf  Per Dr. Arbie CookeyEarly tomorrow 2. Dyspnea/ pul.Edema - recurrent, vol excess improving with HD UF 2.5 l today  3. DM 2 long standing- per admit 4. HTN - bp 135/62 now after hd vol off and on meds= Bidil/ Norvasc 10mg  / Labetalol 300mg  tid 5. Anemia- Hb 7.0-10.0- 9.3 prob d/t CKD; start ESA and transfused with first HD if needed/ also on fe  q hd 6. MBD - Ca corrected 9.8 ,phos 6.2 start binder phoslo 1 ac /  No vit d will check pth   Natalie Pastelavid Zeyfang, PA-C  [facility] [phone] 09/05/2014,2:35 PM  LOS: 5 days   Pt seen, examined and agree w A/P as above.  Vinson Moselleob Yasseen Salls MD pager [phone]    cell [phone] 09/06/2014, 8:50 AM    Labs: Basic Metabolic Panel:  Recent Labs Lab 09/03/14 0610 09/04/14 0624 09/05/14 0746  NA 139 137 139  K 4.6 4.6 4.4  CL 104 102 103  CO2 19 23 21   GLUCOSE 129* 172* 97  BUN 99* 101* 56*  CREATININE 9.40* 9.11* 7.00*  CALCIUM 8.7 8.7 8.7  PHOS  --   --  6.2*   Liver Function Tests:  Recent Labs Lab 08/31/14 0633 09/01/14 0442 09/05/14 0746  AST 21 28  --   ALT 10 12  --   ALKPHOS 75 78  --   BILITOT 0.5 0.3  --   PROT 6.7 6.2  --   ALBUMIN 3.2* 3.0* 2.6*  CBC:  Recent Labs Lab 08/31/14 [id number] 09/01/14 0442  09/02/14 0618 09/03/14 0610  09/04/14 0624 09/05/14 0746  WBC 6.1 7.5  --  6.4 11.7* 19.0* 12.7*  NEUTROABS 5.6  --   --   --   --   --   --   HGB 8.2* 8.0*  --  7.1* 7.0* 10.0* 9.3*  HCT 24.8* 24.5*  < > 21.7* 21.6* 30.5* 28.6*  MCV 76.1* 76.8*  --  78.6 79.7 81.6 81.5  PLT 179 197  --  181 188 226 239  < > = values in this interval not displayed. Cardiac Enzymes:  Recent Labs Lab 09/01/14 0957  CKTOTAL 155  CKMB 6.4*   CBG:  Recent Labs Lab 09/04/14 0658 09/04/14 0816 09/04/14 1546 09/04/14 2132 09/05/14 1133  GLUCAP 170* 209* 137* 92 74    Studies/Results: Dg Chest Port 1 View  09/04/2014   CLINICAL DATA:  81 year old female with shortness of Breath. Initial encounter.  EXAM: PORTABLE CHEST - 1 VIEW  COMPARISON:  09/03/2014 and earlier.  FINDINGS: Portable AP semi upright view at 0702 hrs. Increased  basilar predominant interstitial opacity and obscuration of the diaphragm. Increased veiling opacity at both lung bases. Stable cardiac size and mediastinal contours. No pneumothorax. Visualized tracheal air column is within normal limits.  IMPRESSION: Progressed pulmonary edema, bilateral pleural effusions, and bibasilar atelectasis.   Electronically Signed   By: Augusto Gamble M.D.   On: 09/04/2014 07:21   Medications:   . amLODipine  5 mg Oral Daily  . antiseptic oral rinse  7 mL Mouth Rinse BID  . [START ON 09/06/2014]  ceFAZolin (ANCEF) IV  2 g Intravenous On Call to OR  . [START ON 09/11/2014] darbepoetin (ARANESP) injection - DIALYSIS  40 mcg Intravenous Q Wed-HD  . ferric gluconate (FERRLECIT/NULECIT) IV  125 mg Intravenous Q M,W,F-HD  . heparin  5,000 Units Subcutaneous 3 times per day  . insulin aspart  0-5 Units Subcutaneous QHS  . insulin aspart  0-9 Units Subcutaneous TID WC  . isosorbide-hydrALAZINE  1 tablet Oral BID  . labetalol  200 mg Oral TID  . rosuvastatin  20 mg Oral q1800

## 2014-09-05 NOTE — Progress Notes (Signed)
  [facility] Progress Note  09/05/2014 12:00 PM 1 Day Post-Op  Subjective:  Doing well. Denies any chest pain, shortness of breath and weakness.    Filed Vitals:   09/05/14 1135  BP:   Pulse:   Temp: 97.6 F (36.4 C)  Resp:     Physical Exam: General: resting in bed in NAD Cardiac: nml s1 s2 Lungs: rhonchi bilaterally Extremities: 2+ left radial pulse and brachial pulse. Right femoral temporary catheter.   CBC    Component Value Date/Time   WBC 12.7* 09/05/2014 0746   WBC 8.5 08/29/2014 0842   RBC 3.51* 09/05/2014 0746   RBC 3.71* 08/29/2014 0842   HGB 9.3* 09/05/2014 0746   HCT 28.6* 09/05/2014 0746   HCT 23.3* 09/01/2014 0957   PLT 239 09/05/2014 0746   MCV 81.5 09/05/2014 0746   MCH 26.5 09/05/2014 0746   MCH 25.1* 08/29/2014 0842   MCHC 32.5 09/05/2014 0746   MCHC 32.5 08/29/2014 0842   RDW 19.4* 09/05/2014 0746   RDW 18.3* 08/29/2014 0842   LYMPHSABS 0.2* 08/31/2014 0633   LYMPHSABS 0.6* 08/29/2014 0842   MONOABS 0.3 08/31/2014 0633   EOSABS 0.0 08/31/2014 0633   EOSABS 0.1 08/29/2014 0842   BASOSABS 0.0 08/31/2014 0633   BASOSABS 0.0 08/29/2014 0842    BMET    Component Value Date/Time   NA 139 09/05/2014 0746   NA 134 08/29/2014 0842   K 4.4 09/05/2014 0746   CL 103 09/05/2014 0746   CO2 21 09/05/2014 0746   GLUCOSE 97 09/05/2014 0746   GLUCOSE 92 08/29/2014 0842   BUN 56* 09/05/2014 0746   BUN 95* 08/29/2014 0842   CREATININE 7.00* 09/05/2014 0746   CALCIUM 8.7 09/05/2014 0746   GFRNONAA 5* 09/05/2014 0746   GFRAA 6* 09/05/2014 0746    INR    Component Value Date/Time   INR 1.06 09/04/2014 0624     Intake/Output Summary (Last 24 hours) at 09/05/14 1200 Last data filed at 09/05/14 1044  Gross per 24 hour  Intake    160 ml  Output   4800 ml  Net  -4640 ml     Assessment:  81 y.o. female with AKI on CKD stage V.   Plan: -Had dialysis today removing 2.5L. Breathing status is improved. -Patient is stable  and will be moved to stepdown when bed is available. -Plan for tunneled dialysis catheter and left AV fistula vs graft tomorrow.  -Obtain consent, NPO after midnight.    Kimberly Trinh, PA-C [facility] Office: [phone] Pager: [phone] 09/05/2014 12:00 PM      

## 2014-09-05 NOTE — Progress Notes (Signed)
Before administering heparin shot, noticed a soft nodule/lump on patient's left upper quadrant. Patient states it is not painful when palpated or at any other time. Site marked. Will continue to monitor.

## 2014-09-06 ENCOUNTER — Encounter (HOSPITAL_COMMUNITY)
Admission: EM | Disposition: A | Discharge status: Home Health | Payer: Self-pay | Source: Home / Self Care | Attending: Internal Medicine

## 2014-09-06 ENCOUNTER — Inpatient Hospital Stay (HOSPITAL_COMMUNITY): Payer: Medicare Other

## 2014-09-06 ENCOUNTER — Inpatient Hospital Stay (HOSPITAL_COMMUNITY): Payer: Medicare Other | Admitting: Certified Registered"

## 2014-09-06 ENCOUNTER — Encounter (HOSPITAL_COMMUNITY): Payer: Self-pay | Admitting: Certified Registered"

## 2014-09-06 DIAGNOSIS — N186 End stage renal disease: Secondary | ICD-10-CM

## 2014-09-06 HISTORY — PX: LIGATION OF COMPETING BRANCHES OF ARTERIOVENOUS FISTULA: SHX5949

## 2014-09-06 HISTORY — PX: INSERTION OF DIALYSIS CATHETER: SHX1324

## 2014-09-06 HISTORY — PX: AV FISTULA PLACEMENT: SHX1204

## 2014-09-06 LAB — BASIC METABOLIC PANEL
ANION GAP: 11 (ref 5–15)
BUN: 42 mg/dL — ABNORMAL HIGH (ref 6–23)
CALCIUM: 8.7 mg/dL (ref 8.4–10.5)
CO2: 27 mmol/L (ref 19–32)
Chloride: 103 mmol/L (ref 96–112)
Creatinine, Ser: 6.14 mg/dL — ABNORMAL HIGH (ref 0.50–1.10)
GFR calc Af Amer: 7 mL/min — ABNORMAL LOW (ref >90)
GFR, EST NON AFRICAN AMERICAN: 6 mL/min — AB (ref >90)
Glucose, Bld: 104 mg/dL — ABNORMAL HIGH (ref 70–99)
POTASSIUM: 3.9 mmol/L (ref 3.5–5.1)
Sodium: 141 mmol/L (ref 135–145)

## 2014-09-06 LAB — GLUCOSE, CAPILLARY
GLUCOSE-CAPILLARY: 86 mg/dL (ref 70–99)
Glucose-Capillary: 91 mg/dL (ref 70–99)
Glucose-Capillary: 87 mg/dL (ref 70–99)
Glucose-Capillary: 101 mg/dL — ABNORMAL HIGH (ref 70–99)
Glucose-Capillary: 154 mg/dL — ABNORMAL HIGH (ref 70–99)
Glucose-Capillary: 77 mg/dL (ref 70–99)

## 2014-09-06 LAB — C-PEPTIDE: C-Peptide: 7.2 ng/mL — ABNORMAL HIGH (ref 1.1–4.4)

## 2014-09-06 LAB — CBC
HCT: 28.2 % — ABNORMAL LOW (ref 36.0–46.0)
HEMOGLOBIN: 8.8 g/dL — AB (ref 12.0–15.0)
MCH: 26.3 pg (ref 26.0–34.0)
MCHC: 31.2 g/dL (ref 30.0–36.0)
MCV: 84.2 fL (ref 78.0–100.0)
Platelets: 264 10*3/uL (ref 150–400)
RBC: 3.35 MIL/uL — ABNORMAL LOW (ref 3.87–5.11)
RDW: 20.3 % — ABNORMAL HIGH (ref 11.5–15.5)
WBC: 10.2 10*3/uL (ref 4.0–10.5)

## 2014-09-06 SURGERY — ARTERIOVENOUS (AV) FISTULA CREATION
Anesthesia: Monitor Anesthesia Care | Site: Arm Upper

## 2014-09-06 MED ORDER — FENTANYL CITRATE 0.05 MG/ML IJ SOLN
INTRAMUSCULAR | Status: DC | PRN
  Administered 2014-09-06 (×2): 25 ug via INTRAVENOUS

## 2014-09-06 MED ORDER — PROPOFOL 10 MG/ML IV BOLUS
INTRAVENOUS | Status: DC | PRN
  Administered 2014-09-06: 150 mg via INTRAVENOUS

## 2014-09-06 MED ORDER — LIDOCAINE HCL (CARDIAC) 20 MG/ML IV SOLN
INTRAVENOUS | Status: AC
Start: 2014-09-06 — End: 2014-09-06
  Filled 2014-09-06: qty 10

## 2014-09-06 MED ORDER — ONDANSETRON HCL 4 MG/2ML IJ SOLN
INTRAMUSCULAR | Status: DC | PRN
  Administered 2014-09-06: 4 mg via INTRAVENOUS

## 2014-09-06 MED ORDER — PROPOFOL 10 MG/ML IV BOLUS
INTRAVENOUS | Status: AC
Start: 2014-09-06 — End: 2014-09-06
  Filled 2014-09-06: qty 20

## 2014-09-06 MED ORDER — OXYCODONE HCL 5 MG PO TABS: 5.0000 mg | ORAL_TABLET | Freq: Once | ORAL | Status: DC | PRN

## 2014-09-06 MED ORDER — SODIUM CHLORIDE 0.9 % IR SOLN
Status: DC | PRN
  Administered 2014-09-06: 500 mL

## 2014-09-06 MED ORDER — OXYCODONE HCL 5 MG/5ML PO SOLN: 5.0000 mg | Freq: Once | ORAL | Status: DC | PRN

## 2014-09-06 MED ORDER — NEOSTIGMINE METHYLSULFATE 10 MG/10ML IV SOLN
INTRAVENOUS | Status: AC
  Filled 2014-09-06: qty 1

## 2014-09-06 MED ORDER — PHENYLEPHRINE HCL 10 MG/ML IJ SOLN
10.0000 mg | INTRAVENOUS | Status: DC | PRN
  Administered 2014-09-06: 20 ug/min via INTRAVENOUS

## 2014-09-06 MED ORDER — LIDOCAINE HCL (CARDIAC) 20 MG/ML IV SOLN
INTRAVENOUS | Status: DC | PRN
  Administered 2014-09-06: 80 mg via INTRAVENOUS

## 2014-09-06 MED ORDER — PROTAMINE SULFATE 10 MG/ML IV SOLN
INTRAVENOUS | Status: AC
  Filled 2014-09-06: qty 5

## 2014-09-06 MED ORDER — FENTANYL CITRATE 0.05 MG/ML IJ SOLN
INTRAMUSCULAR | Status: AC
  Filled 2014-09-06: qty 5

## 2014-09-06 MED ORDER — FENTANYL CITRATE 0.05 MG/ML IJ SOLN
INTRAMUSCULAR | Status: AC
  Filled 2014-09-06: qty 2

## 2014-09-06 MED ORDER — CEFAZOLIN SODIUM-DEXTROSE 2-3 GM-% IV SOLR
INTRAVENOUS | Status: AC
  Filled 2014-09-06: qty 50

## 2014-09-06 MED ORDER — LIDOCAINE-EPINEPHRINE (PF) 1 %-1:200000 IJ SOLN
INTRAMUSCULAR | Status: AC
  Filled 2014-09-06: qty 10

## 2014-09-06 MED ORDER — OXYCODONE HCL 5 MG PO TABS
5.0000 mg | ORAL_TABLET | Freq: Four times a day (QID) | ORAL | Status: DC | PRN
  Administered 2014-09-06: 5 mg via ORAL
  Filled 2014-09-06: qty 1

## 2014-09-06 MED ORDER — HEPARIN SODIUM (PORCINE) 1000 UNIT/ML IJ SOLN
INTRAMUSCULAR | Status: AC
  Filled 2014-09-06: qty 1

## 2014-09-06 MED ORDER — FENTANYL CITRATE 0.05 MG/ML IJ SOLN
25.0000 ug | INTRAMUSCULAR | Status: DC | PRN
  Administered 2014-09-06 (×2): 25 ug via INTRAVENOUS

## 2014-09-06 MED ORDER — PHENYLEPHRINE HCL 10 MG/ML IJ SOLN
INTRAMUSCULAR | Status: DC | PRN
  Administered 2014-09-06 (×3): 80 ug via INTRAVENOUS

## 2014-09-06 MED ORDER — ONDANSETRON HCL 4 MG/2ML IJ SOLN: 4.0000 mg | Freq: Four times a day (QID) | INTRAMUSCULAR | Status: DC | PRN

## 2014-09-06 MED ORDER — CALCIUM ACETATE 667 MG PO CAPS
667.0000 mg | ORAL_CAPSULE | Freq: Three times a day (TID) | ORAL | Status: DC
  Administered 2014-09-06 – 2014-09-09 (×7): 667 mg via ORAL
  Filled 2014-09-06 (×12): qty 1

## 2014-09-06 MED ORDER — LIDOCAINE-EPINEPHRINE (PF) 1 %-1:200000 IJ SOLN: INTRAMUSCULAR | Status: DC | PRN

## 2014-09-06 MED ORDER — HEPARIN SODIUM (PORCINE) 1000 UNIT/ML IJ SOLN
INTRAMUSCULAR | Status: DC | PRN
  Administered 2014-09-06: 4.2 mL

## 2014-09-06 MED ORDER — GLYCOPYRROLATE 0.2 MG/ML IJ SOLN
INTRAMUSCULAR | Status: AC
  Filled 2014-09-06: qty 2

## 2014-09-06 MED ORDER — SODIUM CHLORIDE 0.9 % IV SOLN
INTRAVENOUS | Status: DC | PRN
  Administered 2014-09-06 (×2): via INTRAVENOUS

## 2014-09-06 MED ORDER — 0.9 % SODIUM CHLORIDE (POUR BTL) OPTIME
TOPICAL | Status: DC | PRN
  Administered 2014-09-06: 1000 mL

## 2014-09-06 SURGICAL SUPPLY — 60 items
ARMBAND PINK RESTRICT EXTREMIT (MISCELLANEOUS) ×4 IMPLANT
BAG DECANTER FOR FLEXI CONT (MISCELLANEOUS) ×4 IMPLANT
BIOPATCH RED 1 DISK 7.0 (GAUZE/BANDAGES/DRESSINGS) ×3 IMPLANT
BIOPATCH RED 1IN DISK 7.0MM (GAUZE/BANDAGES/DRESSINGS) ×1
BLADE SURG 10 STRL SS (BLADE) ×4 IMPLANT
CANISTER SUCTION 2500CC (MISCELLANEOUS) ×4 IMPLANT
CATH CANNON HEMO 15F 50CM (CATHETERS) IMPLANT
CATH CANNON HEMO 15FR 19 (HEMODIALYSIS SUPPLIES) ×4 IMPLANT
CATH CANNON HEMO 15FR 23CM (HEMODIALYSIS SUPPLIES) IMPLANT
CATH CANNON HEMO 15FR 31CM (HEMODIALYSIS SUPPLIES) IMPLANT
CATH CANNON HEMO 15FR 32CM (HEMODIALYSIS SUPPLIES) IMPLANT
CLIP TI MEDIUM 6 (CLIP) ×4 IMPLANT
CLIP TI WIDE RED SMALL 6 (CLIP) ×8 IMPLANT
COVER PROBE W GEL 5X96 (DRAPES) ×4 IMPLANT
COVER SURGICAL LIGHT HANDLE (MISCELLANEOUS) ×4 IMPLANT
DECANTER SPIKE VIAL GLASS SM (MISCELLANEOUS) ×4 IMPLANT
DRAIN PENROSE 1/4X12 LTX STRL (WOUND CARE) ×4 IMPLANT
DRAPE C-ARM 42X72 X-RAY (DRAPES) ×4 IMPLANT
DRAPE CHEST BREAST 15X10 FENES (DRAPES) ×4 IMPLANT
ELECT REM PT RETURN 9FT ADLT (ELECTROSURGICAL) ×4
ELECTRODE REM PT RTRN 9FT ADLT (ELECTROSURGICAL) ×2 IMPLANT
GAUZE SPONGE 2X2 8PLY STRL LF (GAUZE/BANDAGES/DRESSINGS) ×2 IMPLANT
GAUZE SPONGE 4X4 16PLY XRAY LF (GAUZE/BANDAGES/DRESSINGS) ×4 IMPLANT
GEL ULTRASOUND 20GR AQUASONIC (MISCELLANEOUS) IMPLANT
GLOVE BIOGEL PI IND STRL 6.5 (GLOVE) ×4 IMPLANT
GLOVE BIOGEL PI IND STRL 7.0 (GLOVE) ×2 IMPLANT
GLOVE BIOGEL PI INDICATOR 6.5 (GLOVE) ×4
GLOVE BIOGEL PI INDICATOR 7.0 (GLOVE) ×2
GLOVE ECLIPSE 6.5 STRL STRAW (GLOVE) ×4 IMPLANT
GLOVE SS BIOGEL STRL SZ 7 (GLOVE) ×4 IMPLANT
GLOVE SUPERSENSE BIOGEL SZ 7 (GLOVE) ×4
GLOVE SURG SS PI 7.0 STRL IVOR (GLOVE) ×4 IMPLANT
GOWN STRL REUS W/ TWL LRG LVL3 (GOWN DISPOSABLE) ×6 IMPLANT
GOWN STRL REUS W/ TWL XL LVL3 (GOWN DISPOSABLE) ×4 IMPLANT
GOWN STRL REUS W/TWL LRG LVL3 (GOWN DISPOSABLE) ×6
GOWN STRL REUS W/TWL XL LVL3 (GOWN DISPOSABLE) ×4
KIT BASIN OR (CUSTOM PROCEDURE TRAY) ×4 IMPLANT
KIT ROOM TURNOVER OR (KITS) ×4 IMPLANT
LIQUID BAND (GAUZE/BANDAGES/DRESSINGS) ×8 IMPLANT
NEEDLE 18GX1X1/2 (RX/OR ONLY) (NEEDLE) ×4 IMPLANT
NEEDLE 22X1 1/2 (OR ONLY) (NEEDLE) ×4 IMPLANT
NEEDLE HYPO 25GX1X1/2 BEV (NEEDLE) ×4 IMPLANT
NS IRRIG 1000ML POUR BTL (IV SOLUTION) ×4 IMPLANT
PACK CV ACCESS (CUSTOM PROCEDURE TRAY) ×4 IMPLANT
PACK SURGICAL SETUP 50X90 (CUSTOM PROCEDURE TRAY) ×4 IMPLANT
PAD ARMBOARD 7.5X6 YLW CONV (MISCELLANEOUS) ×8 IMPLANT
PROBE PENCIL 8 MHZ STRL DISP (MISCELLANEOUS) IMPLANT
SOAP 2 % CHG 4 OZ (WOUND CARE) ×4 IMPLANT
SPONGE GAUZE 2X2 STER 10/PKG (GAUZE/BANDAGES/DRESSINGS) ×2
SUT ETHILON 3 0 PS 1 (SUTURE) ×4 IMPLANT
SUT PROLENE 6 0 BV (SUTURE) ×4 IMPLANT
SUT VIC AB 3-0 SH 27 (SUTURE) ×2
SUT VIC AB 3-0 SH 27X BRD (SUTURE) ×2 IMPLANT
SUT VICRYL 4-0 PS2 18IN ABS (SUTURE) ×8 IMPLANT
SYR 20CC LL (SYRINGE) ×4 IMPLANT
SYR 5ML LL (SYRINGE) ×8 IMPLANT
SYR CONTROL 10ML LL (SYRINGE) ×4 IMPLANT
SYRINGE 10CC LL (SYRINGE) ×4 IMPLANT
UNDERPAD 30X30 INCONTINENT (UNDERPADS AND DIAPERS) ×4 IMPLANT
WATER STERILE IRR 1000ML POUR (IV SOLUTION) ×4 IMPLANT

## 2014-09-06 NOTE — Interval H&P Note (Signed)
History and Physical Interval Note:  09/06/2014 10:18 AM  Natalie Wolfe  has presented today for surgery, with the diagnosis of End Stage Renal Disease N18.6  The various methods of treatment have been discussed with the patient and family. After consideration of risks, benefits and other options for treatment, the patient has consented to  Procedure(s): ARTERIOVENOUS (AV) FISTULA CREATION VERSUS GRAFT INSERTION (Left) INSERTION OF DIALYSIS CATHETER (N/A) as a surgical intervention .  The patient's history has been reviewed, patient examined, no change in status, stable for surgery.  I have reviewed the patient's chart and labs.  Questions were answered to the patient's satisfaction.     Josephina GipLAWSON, JAMES

## 2014-09-06 NOTE — Transfer of Care (Signed)
Immediate Anesthesia Transfer of Care Note  Patient: Natalie Wolfe  Procedure(s) Performed: Procedure(s): CREATION OF LEFT UPPER ARM ARTERIOVENOUS (AV) FISTULA  (Left) INSERTION OF DIALYSIS CATHETER RIGHT INTERNAL JUGULAR VEIN (N/A) LIGATION OF COMPETING BRANCHES OF ARTERIOVENOUS FISTULA (Left)  Patient Location: PACU  Anesthesia Type:General  Level of Consciousness: awake, alert , oriented and patient cooperative  Airway & Oxygen Therapy: Patient Spontanous Breathing and Patient connected to nasal cannula oxygen  Post-op Assessment: Report given to RN, Post -op Vital signs reviewed and stable and Patient moving all extremities  Post vital signs: Reviewed and stable  Last Vitals:  Filed Vitals:   09/06/14 1241  BP:   Pulse: 79  Temp: 36.4 C  Resp: 17    Complications: No apparent anesthesia complications

## 2014-09-06 NOTE — Progress Notes (Signed)
PROGRESS NOTE  Natalie Wolfe ZOX:[id number] DOB: 21-Mar-1930 DOA: 08/31/2014 PCP: [name], [name], MD  HPI/Recap of past 24 hours: Natalie Wolfe feeling better, denies dyspnea, chest pain.   Assessment/Plan: Active Problems:   Essential hypertension   Anemia of chronic disease   Acute renal failure   AKI (acute kidney injury)   SOB (shortness of breath)   AKI on CKD- renal U/S, medical renal disease, no obstruction.  -renal consulted, started on HD 09/04/14---1-28 -Natalie Wolfe breathing has improved dramatically since hemodialysis.  -Plan is for permanent catheter and fistula/graft to be place today 1/29 if Natalie Wolfe remains stable -nephrology and vascular surgery on board; will follow their rec's  Hyperkalemia:  in the setting of dehydration and AKI on CKD,  -Potassium within normal limits after hemodialysis has been initiated -correction with dialysis.   Pleural effusion and congestion. -plan is to continue HD  -echocardiogram showing diastolic heart failure; but there is not wall motion abnormalities -need repeated chest x ray to follow pleural effusion.   Hypoglycemia:  Due to poor PO intake in setting of worsening renal failure Stable now and without any further episodes of hypoglycemia Will monitor closely and adjust hypoglycemic regimen as needed  Diarrhea; resolved.  -c.diff negative -will  continue to monitor -No further episodes of diarrhea at this moment -received kayexalate on admission  HTN- continue IV PRN -will continue  labetalol 3 times a day and BiDil twice a day  Anemia: microcystic. Anemia work up in progress, no indication for transfusion. No overt bleed.  -Received 2 units of PRBCs on 09/04/2014 during dialysis -IV iron and ESA started by nephrology -Hemoglobin at 8.8  DM- SSI, monitor carefully as AKI- -no further hypoglycemic event  Code Status: full  Family Communication: Natalie Wolfe at bedside; no family available during  examination  Disposition Plan: remain inpatient; will transfer to MedSurg bed   Consultants:  Nephrology/vascular surgery  Procedures:  none  Antibiotics:   Rocephin/zithrox1 on 1/24  Flagylx1 on 1/24   Objective: BP 115/53 mmHg  Pulse 84  Temp(Src) 97.6 F (36.4 C) (Oral)  Resp 22  Ht 5\' 5"  (1.651 m)  Wt 60.1 kg (132 lb 7.9 oz)  BMI 22.05 kg/m2  SpO2 96%  Intake/Output Summary (Last 24 hours) at 09/06/14 0741 Last data filed at 09/06/14 0600  Gross per 24 hour  Intake    390 ml  Output   2500 ml  Net  -2110 ml   Filed Weights   09/05/14 0726 09/05/14 1044 09/06/14 0447  Weight: 65.4 kg (144 lb 2.9 oz) 62.9 kg (138 lb 10.7 oz) 60.1 kg (132 lb 7.9 oz)    Exam:   General:  Alert in no acute distress.   Cardiovascular: rrr, no rubs or gallops  Respiratory:  scattered rhonchi, no wheezing.   Abdomen: soft/NT/ND, positive bs.  Musculoskeletal: no edema  Data Reviewed: Basic Metabolic Panel:  Recent Labs Lab 09/02/14 0618 09/03/14 0610 09/04/14 0624 09/05/14 0746 09/06/14 0440  NA 138 139 137 139 141  K 5.1 4.6 4.6 4.4 3.9  CL 102 104 102 103 103  CO2 20 19 23 21 27   GLUCOSE 308* 129* 172* 97 104*  BUN 95* 99* 101* 56* 42*  CREATININE 9.31* 9.40* 9.11* 7.00* 6.14*  CALCIUM 8.8 8.7 8.7 8.7 8.7  PHOS  --   --   --  6.2*  --    Liver Function Tests:  Recent Labs Lab 08/31/14 0633 09/01/14 0442 09/05/14 0746  AST 21 28  --  ALT 10 12  --   ALKPHOS 75 78  --   BILITOT 0.5 0.3  --   PROT 6.7 6.2  --   ALBUMIN 3.2* 3.0* 2.6*   CBC:  Recent Labs Lab 08/31/14 0633  09/02/14 0618 09/03/14 0610 09/04/14 0624 09/05/14 0746 09/06/14 0440  WBC 6.1  < > 6.4 11.7* 19.0* 12.7* 10.2  NEUTROABS 5.6  --   --   --   --   --   --   HGB 8.2*  < > 7.1* 7.0* 10.0* 9.3* 8.8*  HCT 24.8*  < > 21.7* 21.6* 30.5* 28.6* 28.2*  MCV 76.1*  < > 78.6 79.7 81.6 81.5 84.2  PLT 179  < > 181 188 226 239 264  < > = values in this interval not  displayed. Cardiac Enzymes:    Recent Labs Lab 09/01/14 0957  CKTOTAL 155  CKMB 6.4*   CBG:  Recent Labs Lab 09/04/14 1546 09/04/14 2132 09/05/14 1133 09/05/14 1654 09/05/14 2154  GLUCAP 137* 92 74 90 125*    Recent Results (from the past 240 hour(s))  MRSA PCR Screening     Status: None   Collection Time: 09/01/14  3:00 AM  Result Value Ref Range Status   MRSA by PCR NEGATIVE NEGATIVE Final    Comment:        The GeneXpert MRSA Assay (FDA approved for NASAL specimens only), is one component of a comprehensive MRSA colonization surveillance program. It is not intended to diagnose MRSA infection nor to guide or monitor treatment for MRSA infections.   Clostridium Difficile by PCR     Status: None   Collection Time: 09/01/14 12:30 PM  Result Value Ref Range Status   C difficile by pcr NEGATIVE NEGATIVE Final  Surgical pcr screen     Status: None   Collection Time: 09/03/14  8:27 PM  Result Value Ref Range Status   MRSA, PCR NEGATIVE NEGATIVE Final   Staphylococcus aureus NEGATIVE NEGATIVE Final    Comment:        The Xpert SA Assay (FDA approved for NASAL specimens in patients over 31 years of age), is one component of a comprehensive surveillance program.  Test performance has been validated by [facility] for patients greater than or equal to 81 year old. It is not intended to diagnose infection nor to guide or monitor treatment.      Studies: No results found.  Scheduled Meds: . amLODipine  5 mg Oral Daily  . antiseptic oral rinse  7 mL Mouth Rinse BID  .  ceFAZolin (ANCEF) IV  2 g Intravenous On Call to OR  . [START ON 09/11/2014] darbepoetin (ARANESP) injection - DIALYSIS  40 mcg Intravenous Q Wed-HD  . ferric gluconate (FERRLECIT/NULECIT) IV  125 mg Intravenous Q M,W,F-HD  . heparin  5,000 Units Subcutaneous 3 times per day  . insulin aspart  0-5 Units Subcutaneous QHS  . insulin aspart  0-9 Units Subcutaneous TID WC  . isosorbide-hydrALAZINE   1 tablet Oral BID  . labetalol  200 mg Oral TID  . multivitamin  1 tablet Oral QHS  . rosuvastatin  20 mg Oral q1800    Continuous Infusions: no    Time: 30 minutes  Angelo Prindle A  [facility] Pager [phone] If 7PM-7AM, please contact night-coverage at [email or web address], password [facility] 09/06/2014, 7:41 AM  LOS: 6 days

## 2014-09-06 NOTE — Op Note (Signed)
OPERATIVE REPORT  Date of Surgery: 08/31/2014 - 09/06/2014  Surgeon: Josephina GipJames Lawson, MD  Assistant: Doreatha MassedSamantha Rhyne PA  Pre-op Diagnosis: End Stage Renal Disease N18.6  Post-op Diagnosis: End Stage Renal Disease N18.6  Procedure: Procedure(s): CREATION OF LEFT UPPER ARM ARTERIOVENOUS (AV) FISTULA -brachial-cephalic  Bilateral ultrasound localization internal jugular veins  INSERTION OF DIALYSIS CATHETER RIGHT INTERNAL JUGULAR VEIN-19 cm  Diateck LIGATION OF COMPETING BRANCHES OF ARTERIOVENOUS FISTULA  Anesthesia: LMA  EBL: Minimal  Complications: None  Procedure Details: The patient was taken the operative placed in supine position at which time upper chest and neck were exposed following induction of general LMA anesthesia. Both internal jugular veins were imaged using the mode ultrasound in both noted to be widely patent. After prepping and draping in routine sterile manner the right IJ was entered using a supraclavicular approach guidewire passed into the right atrium under fluoroscopic guidance. After dilating the track appropriately a 19 cm diateck tunneled hemodialysis catheter was passed through a peel-away sheath positioned in the right atrium tunneled peripherally and secured with nylon sutures. Wound was closed with Vicryl septic fashion. Attention turned to the left upper arm which was then imaged using the mode ultrasound-sono site. The cephalic vein appeared to be adequate for fistula creation. Left upper extremity was then prepped Betadine scrub and solution draped in routine sterile manner. Short transverse incision was made in the antecubital area and the cephalic vein was dissected free. It was ligated distally transected this branches ligated with 3-0 silk ties and divided. It was a 3 mm vein and of good quality. Brachial artery was exposed beneath the fashion circled with Vesseloops. It had Pulse. It was occluded proximally and distally with Vesseloops open 15 blade extended  with the Potts scissors. Vein was carefully measured spatulated and anastomosed inside using continuous 6-0 Prolene. Vessel loops were then released nose excellent pulse and palpable thrill in the cephalic vein in the upper arm. There was also a palpable radial pulse which was slightly diminished with the fistula open but did not disappear. The fistula was then imaged with the sono site and one significant competing branch in the mid upper arm was identified and marked in a short transverse incision was made over the branch was easily identified ligated with 3-0 silk tie recovery room in stable condition   Josephina GipJames Lawson, MD 09/06/2014 12:32 PM

## 2014-09-06 NOTE — Anesthesia Procedure Notes (Signed)
Procedure Name: LMA Insertion Date/Time: 09/06/2014 10:57 AM Performed by: Jerilee HohMUMM, Thomas Rhude N Pre-anesthesia Checklist: Patient identified, Emergency Drugs available, Suction available and Patient being monitored Patient Re-evaluated:Patient Re-evaluated prior to inductionOxygen Delivery Method: Circle system utilized Preoxygenation: Pre-oxygenation with 100% oxygen Intubation Type: IV induction Ventilation: Mask ventilation without difficulty and Oral airway inserted - appropriate to patient size LMA: LMA inserted LMA Size: 4.0 Tube type: Oral Number of attempts: 2 Placement Confirmation: positive ETCO2 and breath sounds checked- equal and bilateral

## 2014-09-06 NOTE — Anesthesia Preprocedure Evaluation (Signed)
Anesthesia Evaluation  Patient identified by MRN, date of birth, ID band Patient awake    Reviewed: Allergy & Precautions, NPO status , Patient's Chart, lab work & pertinent test results  Airway Mallampati: II   Neck ROM: full    Dental   Pulmonary shortness of breath, former smoker,  Recent respiratory distress due to fluid overload.  Dialysis has improved her function.         Cardiovascular hypertension, + Peripheral Vascular Disease     Neuro/Psych Anxiety  Neuromuscular disease    GI/Hepatic   Endo/Other  diabetes, Type 2  Renal/GU ESRF and DialysisRenal disease     Musculoskeletal  (+) Arthritis -,   Abdominal   Peds  Hematology  (+) anemia ,   Anesthesia Other Findings   Reproductive/Obstetrics                             Anesthesia Physical Anesthesia Plan  ASA: III  Anesthesia Plan: MAC   Post-op Pain Management:    Induction: Intravenous  Airway Management Planned: Simple Face Mask  Additional Equipment:   Intra-op Plan:   Post-operative Plan:   Informed Consent: I have reviewed the patients History and Physical, chart, labs and discussed the procedure including the risks, benefits and alternatives for the proposed anesthesia with the patient or authorized representative who has indicated his/her understanding and acceptance.     Plan Discussed with: CRNA, Anesthesiologist and Surgeon  Anesthesia Plan Comments:         Anesthesia Quick Evaluation

## 2014-09-06 NOTE — H&P (View-Only) (Signed)
Patient ID: Natalie Wolfe, female   DOB: 20-Mar-1930, 81 y.o.   MRN: [id number] Events of this morning noted. Patient had significant respiratory distress. Currently in the intensive care unit on BiPAP. He does answer questions. I explained that we would not plan on elective access placement today. Discussed with Dr.Shertz.  She will have a urgent hemodialysis via temporary catheter today. We will plan tunneled catheter and probable graft placement on Friday if she remains stable. We'll continue to follow with you.

## 2014-09-06 NOTE — Progress Notes (Signed)
Ready to go back to room (2S). Plan  to transfer to 6E and bed available post op. Will transfer to 6E when ready.

## 2014-09-06 NOTE — Interval H&P Note (Signed)
History and Physical Interval Note:  09/06/2014 10:19 AM  Leonia ReaderGeorgianna Barbe  has presented today for surgery, with the diagnosis of End Stage Renal Disease N18.6  The various methods of treatment have been discussed with the patient and family. After consideration of risks, benefits and other options for treatment, the patient has consented to  Procedure(s): ARTERIOVENOUS (AV) FISTULA CREATION VERSUS GRAFT INSERTION (Left) INSERTION OF DIALYSIS CATHETER (N/A) as a surgical intervention .  The patient's history has been reviewed, patient examined, no change in status, stable for surgery.  I have reviewed the patient's chart and labs.  Questions were answered to the patient's satisfaction.     Josephina GipLAWSON, Ryllie Nieland

## 2014-09-06 NOTE — Progress Notes (Signed)
Subjective:  tolerated hd today , no sob, ha or cp  Objective Vital signs in last 24 hours: Filed Vitals:   09/06/14 1330 09/06/14 1345 09/06/14 1400 09/06/14 1401  BP: 144/54 132/56    Pulse: 76 76  76  Temp:   97.6 F (36.4 C)   TempSrc:      Resp: Height:      Weight:      SpO2: 100%   100%   Weight change: -5.4 kg (-11 lb 14.5 oz)  Physical Exam: General: Alert, calm ,and  Heart: RRR  1/6 sem lsb, no rub or gallop Lungs: scattered rhonchi and fine rales / non labored breathing  Abdomen:soft , nt, nd Extremities: no pedal edema  Access: R Fem temp hd cath    Problem/Plan: 1. ESRD - new start to dialysis. Doing well. Had AVF and Perm-cath placed today.  2. Dyspnea/ pulm edema / vol excess - mostly resolved w HD 3. DM 2 long standing- per admit 4. HTN - bp 135/62 now after hd vol off and on meds= Bidil/ Norvasc  / Labetalol  tid 5. Anemia- Hb 7.0-10.0- 9.3 prob d/t CKD; start ESA and transfused with first HD if needed/ also on fe  q hd 6. MBD - Ca corrected 9.8 ,phos 6.2 start binder phoslo 1 ac /  No vit d will check pth  7. Dispo - awaiting CLIP, hopefully will get it today but not sure if it will be done. 3rd HD tomorrow.   Vinson Moselle MD pager [phone]    cell [phone] 09/06/2014, 4:11 PM    Labs: Basic Metabolic Panel:  Recent Labs Lab 09/04/14 0624 09/05/14 0746 09/06/14 0440  NA 137 139 141  K 4.6 4.4 3.9  CL 102 103 103  CO2 GLUCOSE 172* 97 104*  BUN 101* 56* 42*  CREATININE 9.11* 7.00* 6.14*  CALCIUM 8.7 8.7 8.7  PHOS  --  6.2*  --    Liver Function Tests:  Recent Labs Lab 08/31/14 0633 09/01/14 0442 09/05/14 0746  AST 21 28  --   ALT 10 12  --   ALKPHOS 75 78  --   BILITOT 0.5 0.3  --   PROT 6.7 6.2  --   ALBUMIN 3.2* 3.0* 2.6*  CBC:  Recent Labs Lab 08/31/14 0633  09/02/14 0618 09/03/14 0610 09/04/14 0624 09/05/14 0746 09/06/14 0440  WBC 6.1  < > 6.4 11.7* 19.0* 12.7* 10.2  NEUTROABS 5.6   --   --   --   --   --   --   HGB 8.2*  < > 7.1* 7.0* 10.0* 9.3* 8.8*  HCT 24.8*  < > 21.7* 21.6* 30.5* 28.6* 28.2*  MCV 76.1*  < > 78.6 79.7 81.6 81.5 84.2  PLT 179  < > 181 188 226 239 264  < > = values in this interval not displayed. Cardiac Enzymes:  Recent Labs Lab 09/01/14 0957  CKTOTAL 155  CKMB 6.4*   CBG:  Recent Labs Lab 09/05/14 2154 09/06/14 0739 09/06/14 0947 09/06/14 1040 09/06/14 1249  GLUCAP 125* 91 86 87 101*    Studies/Results: Dg Chest Port 1 View  09/06/2014   CLINICAL DATA:  Status post diatek catheter placement.  EXAM: PORTABLE CHEST - 1 VIEW  COMPARISON:  09/04/2014  FINDINGS: The right IJ diatek catheter is in good position. The distal tip is at the cavoatrial junction and the proximal tip is in the distal  SVC. No pneumothorax or hematoma. The lungs show improved aeration when compared to prior chest film. There is persistent perihilar pulmonary edema and vascular congestion. No definite pleural effusions.  IMPRESSION: Right IJ dialysis catheter in good position without complicating features.  Improved aeration since prior study.   Electronically Signed   By: Loralie ChampagneMark  Gallerani M.D.   On: 09/06/2014 13:21   Dg Fluoro Guide Cv Line-no Report  09/06/2014   CLINICAL DATA:    FLOURO GUIDE CV LINE  Fluoroscopy was utilized by the requesting physician.  No radiographic  interpretation.    Medications:   . amLODipine  5 mg Oral Daily  . antiseptic oral rinse  7 mL Mouth Rinse BID  . calcium acetate  667 mg Oral TID WC  . [START ON 09/11/2014] darbepoetin (ARANESP) injection - DIALYSIS  40 mcg Intravenous Q Wed-HD  . fentaNYL      . ferric gluconate (FERRLECIT/NULECIT) IV  125 mg Intravenous Q M,W,F-HD  . heparin  5,000 Units Subcutaneous 3 times per day  . insulin aspart  0-5 Units Subcutaneous QHS  . insulin aspart  0-9 Units Subcutaneous TID WC  . isosorbide-hydrALAZINE  1 tablet Oral BID  . labetalol  200 mg Oral TID  . multivitamin  1 tablet Oral QHS  .  rosuvastatin  20 mg Oral q1800

## 2014-09-06 NOTE — Anesthesia Postprocedure Evaluation (Signed)
Anesthesia Post Note  Patient: Leonia ReaderGeorgianna Broyles  Procedure(s) Performed: Procedure(s) (LRB): CREATION OF LEFT UPPER ARM ARTERIOVENOUS (AV) FISTULA  (Left) INSERTION OF DIALYSIS CATHETER RIGHT INTERNAL JUGULAR VEIN (N/A) LIGATION OF COMPETING BRANCHES OF ARTERIOVENOUS FISTULA (Left)  Anesthesia type: General  Patient location: PACU  Post pain: Pain level controlled and Adequate analgesia  Post assessment: Post-op Vital signs reviewed, Patient's Cardiovascular Status Stable, Respiratory Function Stable, Patent Airway and Pain level controlled  Last Vitals:  Filed Vitals:   09/06/14 1401  BP:   Pulse: 76  Temp:   Resp: 14    Post vital signs: Reviewed and stable  Level of consciousness: awake, alert  and oriented  Complications: No apparent anesthesia complications

## 2014-09-06 NOTE — H&P (View-Only) (Signed)
  [facility] Progress Note  09/05/2014 12:00 PM 1 Day Post-Op  Subjective:  Doing well. Denies any chest pain, shortness of breath and weakness.    Filed Vitals:   09/05/14 1135  BP:   Pulse:   Temp: 97.6 F (36.4 C)  Resp:     Physical Exam: General: resting in bed in NAD Cardiac: nml s1 s2 Lungs: rhonchi bilaterally Extremities: 2+ left radial pulse and brachial pulse. Right femoral temporary catheter.   CBC    Component Value Date/Time   WBC 12.7* 09/05/2014 0746   WBC 8.5 08/29/2014 0842   RBC 3.51* 09/05/2014 0746   RBC 3.71* 08/29/2014 0842   HGB 9.3* 09/05/2014 0746   HCT 28.6* 09/05/2014 0746   HCT 23.3* 09/01/2014 0957   PLT 239 09/05/2014 0746   MCV 81.5 09/05/2014 0746   MCH 26.5 09/05/2014 0746   MCH 25.1* 08/29/2014 0842   MCHC 32.5 09/05/2014 0746   MCHC 32.5 08/29/2014 0842   RDW 19.4* 09/05/2014 0746   RDW 18.3* 08/29/2014 0842   LYMPHSABS 0.2* 08/31/2014 0633   LYMPHSABS 0.6* 08/29/2014 0842   MONOABS 0.3 08/31/2014 0633   EOSABS 0.0 08/31/2014 0633   EOSABS 0.1 08/29/2014 0842   BASOSABS 0.0 08/31/2014 0633   BASOSABS 0.0 08/29/2014 0842    BMET    Component Value Date/Time   NA 139 09/05/2014 0746   NA 134 08/29/2014 0842   K 4.4 09/05/2014 0746   CL 103 09/05/2014 0746   CO2 21 09/05/2014 0746   GLUCOSE 97 09/05/2014 0746   GLUCOSE 92 08/29/2014 0842   BUN 56* 09/05/2014 0746   BUN 95* 08/29/2014 0842   CREATININE 7.00* 09/05/2014 0746   CALCIUM 8.7 09/05/2014 0746   GFRNONAA 5* 09/05/2014 0746   GFRAA 6* 09/05/2014 0746    INR    Component Value Date/Time   INR 1.06 09/04/2014 0624     Intake/Output Summary (Last 24 hours) at 09/05/14 1200 Last data filed at 09/05/14 1044  Gross per 24 hour  Intake    160 ml  Output   4800 ml  Net  -4640 ml     Assessment:  81 y.o. female with AKI on CKD stage V.   Plan: -Had dialysis today removing 2.5L. Breathing status is improved. -Patient is stable  and will be moved to stepdown when bed is available. -Plan for tunneled dialysis catheter and left AV fistula vs graft tomorrow.  -Obtain consent, NPO after midnight.    Maris BergerKimberly Copeland Lapier, PA-C [facility] Office: [phone] Pager: [phone] 09/05/2014 12:00 PM

## 2014-09-07 LAB — GLUCOSE, CAPILLARY
GLUCOSE-CAPILLARY: 83 mg/dL (ref 70–99)
GLUCOSE-CAPILLARY: 138 mg/dL — AB (ref 70–99)
Glucose-Capillary: 93 mg/dL (ref 70–99)

## 2014-09-07 LAB — CBC
HEMATOCRIT: 26.7 % — AB (ref 36.0–46.0)
Hemoglobin: 8.5 g/dL — ABNORMAL LOW (ref 12.0–15.0)
MCH: 27.1 pg (ref 26.0–34.0)
MCHC: 31.8 g/dL (ref 30.0–36.0)
MCV: 85 fL (ref 78.0–100.0)
PLATELETS: 222 10*3/uL (ref 150–400)
RBC: 3.14 MIL/uL — AB (ref 3.87–5.11)
RDW: 20.7 % — AB (ref 11.5–15.5)
WBC: 10.2 10*3/uL (ref 4.0–10.5)

## 2014-09-07 LAB — RENAL FUNCTION PANEL
ALBUMIN: 2.6 g/dL — AB (ref 3.5–5.2)
Anion gap: 10 (ref 5–15)
BUN: 35 mg/dL — AB (ref 6–23)
CALCIUM: 8 mg/dL — AB (ref 8.4–10.5)
CO2: 26 mmol/L (ref 19–32)
Chloride: 103 mmol/L (ref 96–112)
Creatinine, Ser: 6.21 mg/dL — ABNORMAL HIGH (ref 0.50–1.10)
GFR calc Af Amer: 7 mL/min — ABNORMAL LOW (ref >90)
GFR calc non Af Amer: 6 mL/min — ABNORMAL LOW (ref >90)
Glucose, Bld: 139 mg/dL — ABNORMAL HIGH (ref 70–99)
Phosphorus: 4.6 mg/dL (ref 2.3–4.6)
Potassium: 3.4 mmol/L — ABNORMAL LOW (ref 3.5–5.1)
SODIUM: 139 mmol/L (ref 135–145)

## 2014-09-07 MED ORDER — LABETALOL HCL 100 MG PO TABS
100.0000 mg | ORAL_TABLET | Freq: Two times a day (BID) | ORAL | Status: DC
  Administered 2014-09-07 – 2014-09-09 (×4): 100 mg via ORAL
  Filled 2014-09-07 (×5): qty 1

## 2014-09-07 MED ORDER — ALTEPLASE 2 MG IJ SOLR
4.0000 mg | Freq: Once | INTRAMUSCULAR | Status: AC
  Administered 2014-09-07: 4 mg
  Filled 2014-09-07: qty 4

## 2014-09-07 NOTE — Progress Notes (Signed)
Subjective:  On hd ,no cos, awaiting clip outpt hd unit/lives close to [facility] she thinks  Objective Vital signs in last 24 hours: Filed Vitals:   09/06/14 2115 09/06/14 2342 09/07/14 0429 09/07/14 1017  BP: 100/41 112/45 112/47 142/54  Pulse:   80 83  Temp:   98 F (36.7 C) 98.1 F (36.7 C)  TempSrc:   Oral Oral  Resp:   14 15  Height:      Weight:      SpO2:   93% 95%   Weight change:  Physical Exam: General: Alert, calm , on hd  Heart: RRR 1/6 sem lsb,  with no rub or gallop Lungs: scattered fine rale/ non labored breathing  Abdomen:soft , nt, nd Extremities: no pedal edema Access:  R ij Perm cath and L UA AVF pos bruit/ R Fem temp hd cath   Problem/Plan: 1. ESRD - new start dialysis.SP AF / Perm cath yesterday/ awating op hd unit assignment  / dc fem temp hd cath 2. Dyspnea/ pulm edema / vol excess - resolved w HD 3. DM 2 long standing- per admit 4. HTN - bp 142/54 /now after hd vol off and decr bp meds=Bidil= dc/ Norvasc  = decr to / Labetalol  tid =decr  bid 5. Anemia- Hb 7.0-10.0- 9.3>8.8 prob d/t CKD; start ESA and transfused with first HD if needed/ also on fe q hd tfs =18% 6. MBD - Ca corrected 9.8 ,phos 6.2 start binder phoslo 1 ac / No vit d with Pending  pth lab 7. Dispo - awaiting CLIP,. 3rd HD today.   Lenny Pastel, PA-C [facility] Beeper [phone] 09/07/2014,12:09 PM  LOS: 7 days   Pt seen, examined and agree w A/P as above. Get temp HD cath out of leg.  OK for dc when CLIP is complete, which should be Monday.  Next HD Tuesday after today.  Vinson Moselle MD pager [phone]    cell [phone] 09/07/2014, 1:57 PM    Labs: Basic Metabolic Panel:  Recent Labs Lab 09/04/14 0624 09/05/14 0746 09/06/14 0440  NA 137 139 141  K 4.6 4.4 3.9  CL 102 103 103  CO2 GLUCOSE 172* 97 104*  BUN 101* 56* 42*  CREATININE 9.11* 7.00* 6.14*  CALCIUM 8.7 8.7 8.7  PHOS  --  6.2*  --    Liver Function  Tests:  Recent Labs Lab 09/01/14 0442 09/05/14 0746  AST 28  --   ALT 12  --   ALKPHOS 78  --   BILITOT 0.3  --   PROT 6.2  --   ALBUMIN 3.0* 2.6*   CBC:  Recent Labs Lab 09/02/14 0618 09/03/14 0610 09/04/14 0624 09/05/14 0746 09/06/14 0440  WBC 6.4 11.7* 19.0* 12.7* 10.2  HGB 7.1* 7.0* 10.0* 9.3* 8.8*  HCT 21.7* 21.6* 30.5* 28.6* 28.2*  MCV 78.6 79.7 81.6 81.5 84.2  PLT 181 188 226 239 264   Cardiac Enzymes:  Recent Labs Lab 09/01/14 0957  CKTOTAL 155  CKMB 6.4*   CBG:  Recent Labs Lab 09/06/14 1040 09/06/14 1249 09/06/14 1811 09/06/14 2049 09/07/14 0818  GLUCAP 87 101* 154* 77 83    Studies/Results: Dg Chest Port 1 View  09/06/2014   CLINICAL DATA:  Status post diatek catheter placement.  EXAM: PORTABLE CHEST - 1 VIEW  COMPARISON:  09/04/2014  FINDINGS: The right IJ diatek catheter is in good position. The distal tip is at the cavoatrial junction and the proximal tip  is in the distal SVC. No pneumothorax or hematoma. The lungs show improved aeration when compared to prior chest film. There is persistent perihilar pulmonary edema and vascular congestion. No definite pleural effusions.  IMPRESSION: Right IJ dialysis catheter in good position without complicating features.  Improved aeration since prior study.   Electronically Signed   By: Loralie ChampagneMark  Gallerani M.D.   On: 09/06/2014 13:21   Dg Fluoro Guide Cv Line-no Report  09/06/2014   CLINICAL DATA:    FLOURO GUIDE CV LINE  Fluoroscopy was utilized by the requesting physician.  No radiographic  interpretation.    Medications:   . amLODipine  5 mg Oral Daily  . antiseptic oral rinse  7 mL Mouth Rinse BID  . calcium acetate  667 mg Oral TID WC  . [START ON 09/11/2014] darbepoetin (ARANESP) injection - DIALYSIS  40 mcg Intravenous Q Wed-HD  . ferric gluconate (FERRLECIT/NULECIT) IV  125 mg Intravenous Q M,W,F-HD  . heparin  5,000 Units Subcutaneous 3 times per day  . insulin aspart  0-5 Units Subcutaneous QHS  .  insulin aspart  0-9 Units Subcutaneous TID WC  . labetalol  100 mg Oral BID  . multivitamin  1 tablet Oral QHS  . rosuvastatin  20 mg Oral q1800

## 2014-09-07 NOTE — Progress Notes (Signed)
Right femoral temporary hdc cath removed per order.  Suters removed.  Removed intact.  Vaseline pressure guaze, manual pressure held for 30 minutes.  No signs of bleeding.  Pressure dsg. Applied.  Staff RN to monitor.

## 2014-09-07 NOTE — Progress Notes (Signed)
Femoral catheter used for hemodialysis due to poor right chest catheter function. Cathflo has been instilled into each HD catheter lumen until next HD treatment.

## 2014-09-07 NOTE — Progress Notes (Addendum)
   [facility] [place]  Subjective  - Doing well other than soreness from surgery sites.No complaints of steal symptoms in the left hand.   Objective 112/47 80 98 F (36.7 C) (Oral) 14 93%  Intake/Output Summary (Last 24 hours) at 09/07/14 0724 Last data filed at 09/06/14 1759  Gross per 24 hour  Intake    620 ml  Output    100 ml  Net    520 ml    Palpable thrill BC fistula Left hand distally N/M/V intact palpable radial pulses 2+ Left diatek catheter site C/D/I  Assessment/Planning: POD #1 CREATION OF LEFT UPPER ARM ARTERIOVENOUS (AV) FISTULA -brachial-cephalic  Bilateral ultrasound localization internal jugular veins  INSERTION OF DIALYSIS CATHETER RIGHT INTERNAL JUGULAR VEIN-19 cm Diateck LIGATION OF COMPETING BRANCHES OF ARTERIOVENOUS FISTULA The fistula will be usable in 12 weeks  She will f/u in our office in 6 weeks with Dr. Hart RochesterLawson.   Clinton GallantCOLLINS, EMMA [facility] 09/07/2014 7:24 AM --  Laboratory Lab Results:  Recent Labs  09/05/14 0746 09/06/14 0440  WBC 12.7* 10.2  HGB 9.3* 8.8*  HCT 28.6* 28.2*  PLT 239 264   BMET  Recent Labs  09/05/14 0746 09/06/14 0440  NA 139 141  K 4.4 3.9  CL 103 103  CO2 21 27  GLUCOSE 97 104*  BUN 56* 42*  CREATININE 7.00* 6.14*  CALCIUM 8.7 8.7    COAG Lab Results  Component Value Date   INR 1.06 09/04/2014   INR 1.01 09/01/2014   No results found for: PTT    I agree with the above Follow up in 6 weeks with Dr. Lavera GuiseLawson  [facility] Chastity Noland

## 2014-09-07 NOTE — Progress Notes (Signed)
PROGRESS NOTE  Natalie ReaderGeorgianna Wolfe ZOX:[id number] DOB: 07/10/1930 DOA: 08/31/2014 PCP: [name], [name], MD  HPI/Recap of past 24 hours: Patient feeling better, denies dyspnea, chest pain.   Assessment/Plan: Active Problems:   Essential hypertension   Anemia of chronic disease   Acute renal failure   AKI (acute kidney injury)   SOB (shortness of breath)   ESRD, new on dialysis. AKI on CKD- renal U/S, medical renal disease, no obstruction.  -renal consulted, started on HD 09/04/14---1-28 -Patient breathing has improved dramatically since hemodialysis.  -Plan is for permanent catheter and fistula/graft to be place today 1/29 if patient remains stable -nephrology and vascular surgery on board; will follow their rec's -awaiting clip.   Hyperkalemia:  in the setting of dehydration and AKI on CKD,  -Potassium within normal limits after hemodialysis has been initiated -correction with dialysis.   Pleural effusion and congestion. -plan is to continue HD  -echocardiogram showing diastolic heart failure; but there is not wall motion abnormalities -need repeated chest x ray to follow pleural effusion.   Hypoglycemia:  Due to poor PO intake in setting of worsening renal failure Stable now and without any further episodes of hypoglycemia Will monitor closely and adjust hypoglycemic regimen as needed  Diarrhea; resolved.  -c.diff negative -will  continue to monitor -No further episodes of diarrhea at this moment -received kayexalate on admission  HTN- continue IV PRN -will continue  labetalol 3 times a day and BiDil twice a day  Anemia: microcystic. Anemia work up in progress, no indication for transfusion. No overt bleed.  -Received 2 units of PRBCs on 09/04/2014 during dialysis -IV iron and ESA started by nephrology -Hemoglobin at 8.8  DM- SSI, monitor carefully as AKI- -no further hypoglycemic event  Code Status: full  Family Communication: patient at bedside; no family  available during examination  Disposition Plan: PT consult, home when clip done   Consultants:  Nephrology/vascular surgery  Procedures:  none  Antibiotics:   Rocephin/zithrox1 on 1/24  Flagylx1 on 1/24   Objective: BP 130/61 mmHg  Pulse 85  Temp(Src) 98.3 F (36.8 C) (Oral)  Resp 14  Ht 5\' 5"  (1.651 m)  Wt 61.3 kg (135 lb 2.3 oz)  BMI 22.49 kg/m2  SpO2 96%  Intake/Output Summary (Last 24 hours) at 09/07/14 1403 Last data filed at 09/06/14 1759  Gross per 24 hour  Intake    120 ml  Output     50 ml  Net     70 ml   Filed Weights   09/05/14 1044 09/06/14 0447 09/07/14 1130  Weight: 62.9 kg (138 lb 10.7 oz) 60.1 kg (132 lb 7.9 oz) 61.3 kg (135 lb 2.3 oz)    Exam:   General:  Alert in no acute distress.   Cardiovascular: rrr, no rubs or gallops  Respiratory:  scattered rhonchi, no wheezing.   Abdomen: soft/NT/ND, positive bs.  Musculoskeletal: no edema  Data Reviewed: Basic Metabolic Panel:  Recent Labs Lab 09/03/14 0610 09/04/14 0624 09/05/14 0746 09/06/14 0440 09/07/14 1200  NA 139 137 139 141 139  K 4.6 4.6 4.4 3.9 3.4*  CL 104 102 103 103 103  CO2 19 23 21 27 26   GLUCOSE 129* 172* 97 104* 139*  BUN 99* 101* 56* 42* 35*  CREATININE 9.40* 9.11* 7.00* 6.14* 6.21*  CALCIUM 8.7 8.7 8.7 8.7 8.0*  PHOS  --   --  6.2*  --  4.6   Liver Function Tests:  Recent Labs Lab 09/01/14 0442 09/05/14 0746  09/07/14 1200  AST 28  --   --   ALT 12  --   --   ALKPHOS 78  --   --   BILITOT 0.3  --   --   PROT 6.2  --   --   ALBUMIN 3.0* 2.6* 2.6*   CBC:  Recent Labs Lab 09/03/14 0610 09/04/14 0624 09/05/14 0746 09/06/14 0440 09/07/14 1200  WBC 11.7* 19.0* 12.7* 10.2 10.2  HGB 7.0* 10.0* 9.3* 8.8* 8.5*  HCT 21.6* 30.5* 28.6* 28.2* 26.7*  MCV 79.7 81.6 81.5 84.2 85.0  PLT 188 226 239 264 222   Cardiac Enzymes:    Recent Labs Lab 09/01/14 0957  CKTOTAL 155  CKMB 6.4*   CBG:  Recent Labs Lab 09/06/14 1040 09/06/14 1249  09/06/14 1811 09/06/14 2049 09/07/14 0818  GLUCAP 87 101* 154* 77 83    Recent Results (from the past 240 hour(s))  MRSA PCR Screening     Status: None   Collection Time: 09/01/14  3:00 AM  Result Value Ref Range Status   MRSA by PCR NEGATIVE NEGATIVE Final    Comment:        The GeneXpert MRSA Assay (FDA approved for NASAL specimens only), is one component of a comprehensive MRSA colonization surveillance program. It is not intended to diagnose MRSA infection nor to guide or monitor treatment for MRSA infections.   Clostridium Difficile by PCR     Status: None   Collection Time: 09/01/14 12:30 PM  Result Value Ref Range Status   C difficile by pcr NEGATIVE NEGATIVE Final  Surgical pcr screen     Status: None   Collection Time: 09/03/14  8:27 PM  Result Value Ref Range Status   MRSA, PCR NEGATIVE NEGATIVE Final   Staphylococcus aureus NEGATIVE NEGATIVE Final    Comment:        The Xpert SA Assay (FDA approved for NASAL specimens in patients over 59 years of age), is one component of a comprehensive surveillance program.  Test performance has been validated by [facility] for patients greater than or equal to 18 year old. It is not intended to diagnose infection nor to guide or monitor treatment.      Studies: Dg Chest Port 1 View  09/06/2014   CLINICAL DATA:  Status post diatek catheter placement.  EXAM: PORTABLE CHEST - 1 VIEW  COMPARISON:  09/04/2014  FINDINGS: The right IJ diatek catheter is in good position. The distal tip is at the cavoatrial junction and the proximal tip is in the distal SVC. No pneumothorax or hematoma. The lungs show improved aeration when compared to prior chest film. There is persistent perihilar pulmonary edema and vascular congestion. No definite pleural effusions.  IMPRESSION: Right IJ dialysis catheter in good position without complicating features.  Improved aeration since prior study.   Electronically Signed   By: Loralie Champagne M.D.    On: 09/06/2014 13:21   Dg Fluoro Guide Cv Line-no Report  09/06/2014   CLINICAL DATA:    FLOURO GUIDE CV LINE  Fluoroscopy was utilized by the requesting physician.  No radiographic  interpretation.     Scheduled Meds: . amLODipine  5 mg Oral Daily  . antiseptic oral rinse  7 mL Mouth Rinse BID  . calcium acetate  667 mg Oral TID WC  . [START ON 09/11/2014] darbepoetin (ARANESP) injection - DIALYSIS  40 mcg Intravenous Q Wed-HD  . ferric gluconate (FERRLECIT/NULECIT) IV  125 mg Intravenous Q M,W,F-HD  . heparin  5,000 Units Subcutaneous 3 times per day  . insulin aspart  0-5 Units Subcutaneous QHS  . insulin aspart  0-9 Units Subcutaneous TID WC  . labetalol  100 mg Oral BID  . multivitamin  1 tablet Oral QHS  . rosuvastatin  20 mg Oral q1800    Continuous Infusions: no    Time: 30 minutes  Mabel Unrein A  [facility] Pager [phone] If 7PM-7AM, please contact night-coverage at [email or web address], password [facility] 09/07/2014, 2:03 PM  LOS: 7 days

## 2014-09-08 LAB — GLUCOSE, CAPILLARY
GLUCOSE-CAPILLARY: 111 mg/dL — AB (ref 70–99)
Glucose-Capillary: 114 mg/dL — ABNORMAL HIGH (ref 70–99)
Glucose-Capillary: 109 mg/dL — ABNORMAL HIGH (ref 70–99)
Glucose-Capillary: 108 mg/dL — ABNORMAL HIGH (ref 70–99)

## 2014-09-08 LAB — PARATHYROID HORMONE, INTACT (NO CA): PTH: 170 pg/mL — AB (ref 15–65)

## 2014-09-08 NOTE — Progress Notes (Signed)
PROGRESS NOTE  Natalie Wolfe ZOX:[id number] DOB: July 14, 1930 DOA: 08/31/2014 PCP: [name], MD  HPI/Recap of past 24 hours: No complaints.   Assessment/Plan: Active Problems:   Essential hypertension   Anemia of chronic disease   Acute renal failure   AKI (acute kidney injury)   SOB (shortness of breath)   ESRD, new on dialysis. AKI on CKD- renal U/S, medical renal disease, no obstruction.  -renal consulted, started on HD 09/04/14---1-28 -Patient breathing has improved dramatically since hemodialysis.  -Plan is for permanent catheter and fistula/graft to be place today 1/29 if patient remains stable -nephrology and vascular surgery on board; will follow their rec's -awaiting clip.   Hyperkalemia:  in the setting of dehydration and AKI on CKD,  -Potassium within normal limits after hemodialysis has been initiated   Pleural effusion and congestion. -plan is to continue HD  -echocardiogram showing diastolic heart failure; but there is not wall motion abnormalities -need repeat chest x ray 1 week.   Hypoglycemia:  Due to poor PO intake in setting of worsening renal failure Stable now and without any further episodes of hypoglycemia Will monitor closely and adjust hypoglycemic regimen as needed  Diarrhea; resolved.  -c.diff negative -will  continue to monitor -No further episodes of diarrhea at this moment -received kayexalate on admission  HTN- continue IV PRN -will continue  labetalol 3 times a day and BiDil twice a day  Anemia: microcystic. Anemia work up in progress, no indication for transfusion. No overt bleed.  -Received 2 units of PRBCs on 09/04/2014 during dialysis -IV iron and ESA started by nephrology -Hemoglobin at 8.8  DM- SSI, monitor carefully as AKI- -no further hypoglycemic event  Code Status: full  Family Communication: patient at bedside; no family available during examination  Disposition Plan: PT consult, home when clip  done   Consultants:  Nephrology/vascular surgery  Procedures:  none  Antibiotics:   Rocephin/zithrox1 on 1/24  Flagylx1 on 1/24   Objective: BP 127/51 mmHg  Pulse 79  Temp(Src) 98.5 F (36.9 C) (Oral)  Resp 16  Ht  (1.651 m)  Wt 59.6 kg (131 lb 6.3 oz)  BMI 21.87 kg/m2  SpO2 96%  Intake/Output Summary (Last 24 hours) at 09/08/14 1215 Last data filed at 09/08/14 0700  Gross per 24 hour  Intake    120 ml  Output   2100 ml  Net  -1980 ml   Filed Weights   09/06/14 0447 09/07/14 1130 09/07/14 1610  Weight: 60.1 kg (132 lb 7.9 oz) 61.3 kg (135 lb 2.3 oz) 59.6 kg (131 lb 6.3 oz)    Exam:   General:  Alert in no acute distress.   Cardiovascular: rrr, no rubs or gallops  Respiratory:  scattered rhonchi, no wheezing.   Abdomen: soft/NT/ND, positive bs.  Musculoskeletal: no edema  Data Reviewed: Basic Metabolic Panel:  Recent Labs Lab 09/03/14 0610 09/04/14 0624 09/05/14 0746 09/06/14 0440 09/07/14 1200  NA 139 137 139 141 139  K 4.6 4.6 4.4 3.9 3.4*  CL 104 102 103 103 103  CO2 GLUCOSE 129* 172* 97 104* 139*  BUN 99* 101* 56* 42* 35*  CREATININE 9.40* 9.11* 7.00* 6.14* 6.21*  CALCIUM 8.7 8.7 8.7 8.7 8.0*  PHOS  --   --  6.2*  --  4.6   Liver Function Tests:  Recent Labs Lab 09/05/14 0746 09/07/14 1200  ALBUMIN 2.6* 2.6*   CBC:  Recent Labs Lab 09/03/14 0610 09/04/14 8119  09/05/14 0746 09/06/14 0440 09/07/14 1200  WBC 11.7* 19.0* 12.7* 10.2 10.2  HGB 7.0* 10.0* 9.3* 8.8* 8.5*  HCT 21.6* 30.5* 28.6* 28.2* 26.7*  MCV 79.7 81.6 81.5 84.2 85.0  PLT 188 226 239 264 222   Cardiac Enzymes:   No results for input(s): CKTOTAL, CKMB, CKMBINDEX, TROPONINI in the last 168 hours. CBG:  Recent Labs Lab 09/06/14 2049 09/07/14 0818 09/07/14 1701 09/07/14 2132 09/08/14 1205  GLUCAP 77 83 93 138* 109*    Recent Results (from the past 240 hour(s))  MRSA PCR Screening     Status: None   Collection Time: 09/01/14   3:00 AM  Result Value Ref Range Status   MRSA by PCR NEGATIVE NEGATIVE Final    Comment:        The GeneXpert MRSA Assay (FDA approved for NASAL specimens only), is one component of a comprehensive MRSA colonization surveillance program. It is not intended to diagnose MRSA infection nor to guide or monitor treatment for MRSA infections.   Clostridium Difficile by PCR     Status: None   Collection Time: 09/01/14 12:30 PM  Result Value Ref Range Status   C difficile by pcr NEGATIVE NEGATIVE Final  Surgical pcr screen     Status: None   Collection Time: 09/03/14  8:27 PM  Result Value Ref Range Status   MRSA, PCR NEGATIVE NEGATIVE Final   Staphylococcus aureus NEGATIVE NEGATIVE Final    Comment:        The Xpert SA Assay (FDA approved for NASAL specimens in patients over 79 years of age), is one component of a comprehensive surveillance program.  Test performance has been validated by [facility] for patients greater than or equal to 79 year old. It is not intended to diagnose infection nor to guide or monitor treatment.      Studies: No results found.  Scheduled Meds: . antiseptic oral rinse  7 mL Mouth Rinse BID  . calcium acetate  667 mg Oral TID WC  . [START ON 09/11/2014] darbepoetin (ARANESP) injection - DIALYSIS  40 mcg Intravenous Q Wed-HD  . ferric gluconate (FERRLECIT/NULECIT) IV  125 mg Intravenous Q M,W,F-HD  . heparin  5,000 Units Subcutaneous 3 times per day  . insulin aspart  0-5 Units Subcutaneous QHS  . insulin aspart  0-9 Units Subcutaneous TID WC  . labetalol  100 mg Oral BID  . multivitamin  1 tablet Oral QHS  . rosuvastatin  20 mg Oral q1800    Continuous Infusions: no    Time: 30 minutes  Anthony Roland A  [facility] Pager [phone] If 7PM-7AM, please contact night-coverage at [email or web address], password [email or web address] 09/08/2014, 12:15 PM  LOS: 8 days

## 2014-09-08 NOTE — Progress Notes (Signed)
Pt co catheter pain, paged Dr. Hollace Haywardegalodo FYI pt requests to remove catheter and pt has had no output yesterday or last night.

## 2014-09-08 NOTE — Progress Notes (Signed)
Subjective:  I want this foley out/ tolerated hd yesterday/ temp fem hd cath out/ Awaiting clip to [facility]  Objective Vital signs in last 24 hours: Filed Vitals:   09/07/14 1530 09/07/14 1610 09/07/14 2136 09/08/14 0439  BP: 135/65 138/66 144/57 127/51  Pulse: 82 85 87 79  Temp: 98 F (36.7 C) 98.1 F (36.7 C) 98.5 F (36.9 C) 98.5 F (36.9 C)  TempSrc: Oral Oral Oral Oral  Resp: 15 14 15 16   Height:      Weight:  59.6 kg (131 lb 6.3 oz)    SpO2:  98% 96% 96%   Weight change:  Physical Exam: General: Alert, calm , on hd  Heart: RRR 1/6 sem lsb, with no rub or gallop Lungs: scattered fine rale/ non labored breathing  Abdomen:soft , nt, nd Extremities: Trace bipedal edema  HDAccess: R ij Perm cath and L UA AVF pos bruit  Problem/Plan: 1. ESRD - new start dialysis.SP AVF / Perm cath / Tolerated HD yesterday/ awating op hd unit assignment/ next hd Tuesday    3.4 k yesterday  2. Dyspnea/ pulm edema / vol excess - resolved w HD 3. DM 2 long standing- per admit 4. HTN - bp 127/51 /now after hd vol off and decr bp meds=Bidil= dc/ Norvasc 10mg  = decr to 5mg / will dc today  Labetalol 300mg  tid =decr 100mg  bid 5. Anemia- Hb 7.0-10.0- 9.3>8.8>8.5 prob d/t CKD; started ESA  40mcg Aranesp and transfused with first HD if needed/ also on feload q hd tfs =18% 6. MBD - Ca corrected 9.1 ,phos 4.6 start binder phoslo 1 ac / No vit d with Pending pth lab 7. Dispo - awaiting CLIP, thinks she can mange with aide help at home/ lives alone.   Lenny Pastelavid Zeyfang, PA-C [facility] Beeper [phone] 09/08/2014,9:52 AM  LOS: 8 days   Pt seen, examined and agree w A/P as above.  Should be ready for dc in next day or two, just awaiting CLIP to outpatient HD unit.   Vinson Moselleob Carolyne Whitsel MD pager [phone]    cell [phone] 09/08/2014, 1:23 PM    Labs: Basic Metabolic Panel:  Recent Labs Lab 09/05/14 0746 09/06/14 0440 09/07/14 1200  NA 139 141 139  K 4.4 3.9 3.4*  CL 103 103 103   CO2 21 27 26   GLUCOSE 97 104* 139*  BUN 56* 42* 35*  CREATININE 7.00* 6.14* 6.21*  CALCIUM 8.7 8.7 8.0*  PHOS 6.2*  --  4.6   Liver Function Tests:  Recent Labs Lab 09/05/14 0746 09/07/14 1200  ALBUMIN 2.6* 2.6*  CBC:  Recent Labs Lab 09/03/14 0610 09/04/14 0624 09/05/14 0746 09/06/14 0440 09/07/14 1200  WBC 11.7* 19.0* 12.7* 10.2 10.2  HGB 7.0* 10.0* 9.3* 8.8* 8.5*  HCT 21.6* 30.5* 28.6* 28.2* 26.7*  MCV 79.7 81.6 81.5 84.2 85.0  PLT 188 226 239 264 222   Cardiac Enzymes:  Recent Labs Lab 09/01/14 0957  CKTOTAL 155  CKMB 6.4*   CBG:  Recent Labs Lab 09/06/14 1811 09/06/14 2049 09/07/14 0818 09/07/14 1701 09/07/14 2132  GLUCAP 154* 77 83 93 138*    Studies/Results: Dg Chest Port 1 View  09/06/2014   CLINICAL DATA:  Status post diatek catheter placement.  EXAM: PORTABLE CHEST - 1 VIEW  COMPARISON:  09/04/2014  FINDINGS: The right IJ diatek catheter is in good position. The distal tip is at the cavoatrial junction and the proximal tip is in the distal SVC. No pneumothorax or hematoma. The lungs show  improved aeration when compared to prior chest film. There is persistent perihilar pulmonary edema and vascular congestion. No definite pleural effusions.  IMPRESSION: Right IJ dialysis catheter in good position without complicating features.  Improved aeration since prior study.   Electronically Signed   By: Loralie Champagne M.D.   On: 09/06/2014 13:21   Dg Fluoro Guide Cv Line-no Report  09/06/2014   CLINICAL DATA:    FLOURO GUIDE CV LINE  Fluoroscopy was utilized by the requesting physician.  No radiographic  interpretation.    Medications:   . amLODipine  5 mg Oral Daily  . antiseptic oral rinse  7 mL Mouth Rinse BID  . calcium acetate  667 mg Oral TID WC  . [START ON 09/11/2014] darbepoetin (ARANESP) injection - DIALYSIS  40 mcg Intravenous Q Wed-HD  . ferric gluconate (FERRLECIT/NULECIT) IV  125 mg Intravenous Q M,W,F-HD  . heparin  5,000 Units  Subcutaneous 3 times per day  . insulin aspart  0-5 Units Subcutaneous QHS  . insulin aspart  0-9 Units Subcutaneous TID WC  . labetalol  100 mg Oral BID  . multivitamin  1 tablet Oral QHS  . rosuvastatin  20 mg Oral q1800

## 2014-09-08 NOTE — Evaluation (Signed)
Physical Therapy Evaluation Patient Details Name: Natalie Wolfe MRN: [id number] DOB: 09-23-1929 Today's Date: 09/08/2014   History of Present Illness  Natalie Wolfe is an 81 yo female Who is from  [place].  She has been complaining of b/l weakness in her legs and that progressed to all over weakness.  She has fallen after he knees gave out; Acute renal Failure with start of HD  Past Medical History  Diagnosis Date  . Urinary frequency   . Routine general medical examination at a health care facility   . Other atopic dermatitis and related conditions   . Rash and other nonspecific skin eruption   . Other anxiety states   . Insomnia, unspecified   . Chronic kidney disease, stage II (mild)   . Type II or unspecified type diabetes mellitus with renal manifestations, not stated as uncontrolled   . PVD (peripheral vascular disease)   . Unspecified vitamin D deficiency   . Anemia, unspecified   . Obesity, unspecified   . Nocturia   . Pain in joint, lower leg   . Other and unspecified hyperlipidemia   . Unspecified essential hypertension   . Cataract    Past Surgical History  Procedure Laterality Date  . Abdominal hysterectomy  1970  . Breast surgery  1979    nodule removed  . Eye surgery  2004    catarcts removed from right eye.  . Multiple tooth extractions  09/2013    Remonve remaining 3 teeth, no with dentures      Clinical Impression  Pt admitted with above diagnosis. Pt currently with functional limitations due to the deficits listed below (see PT Problem List).  Pt will benefit from skilled PT to increase their independence and safety with mobility to allow discharge to the venue listed below.       Follow Up Recommendations Home health PT    Equipment Recommendations  Rolling walker with 5" wheels    Recommendations for Other Services       Precautions / Restrictions Precautions Precautions: Fall      Mobility  Bed  Mobility Overal bed mobility: Modified Independent                Transfers Overall transfer level: Needs assistance Equipment used: Rolling walker (2 wheeled) Transfers: Sit to/from Stand Sit to Stand: Supervision         General transfer comment: Cues for handplacement and technqiue  Ambulation/Gait Ambulation/Gait assistance: Min guard;Supervision Ambulation Distance (Feet): 300 Feet Assistive device: Rolling walker (2 wheeled) Gait Pattern/deviations: Step-through pattern     General Gait Details: Initial cues for RW proximity and use; minguard assist progressing to supervision  Stairs            Wheelchair Mobility    Modified Rankin (Stroke Patients Only)       Balance Overall balance assessment: History of Falls                                           Pertinent Vitals/Pain Pain Assessment: No/denies pain    Home Living Family/patient expects to be discharged to:: Assisted living               Home Equipment: None      Prior Function Level of Independence: Independent         Comments: but with recent falls  Hand Dominance        Extremity/Trunk Assessment   Upper Extremity Assessment: Overall WFL for tasks assessed           Lower Extremity Assessment: Overall WFL for tasks assessed;Generalized weakness         Communication   Communication: No difficulties  Cognition Arousal/Alertness: Awake/alert Behavior During Therapy: WFL for tasks assessed/performed Overall Cognitive Status: Within Functional Limits for tasks assessed                      General Comments      Exercises        Assessment/Plan    PT Assessment Patient needs continued PT services  PT Diagnosis Difficulty walking;Generalized weakness   PT Problem List Decreased strength;Decreased activity tolerance;Decreased balance;Decreased mobility;Decreased knowledge of use of DME  PT Treatment Interventions DME  instruction;Gait training;Stair training;Functional mobility training;Therapeutic activities;Therapeutic exercise;Patient/family education   PT Goals (Current goals can be found in the Care Plan section) Acute Rehab PT Goals Patient Stated Goal: to go home PT Goal Formulation: With patient Time For Goal Achievement: 09/22/14 Potential to Achieve Goals: Good    Frequency Min 3X/week   Barriers to discharge        Co-evaluation               End of Session Equipment Utilized During Treatment: Gait belt Activity Tolerance: Patient tolerated treatment well Patient left: in chair;with call bell/phone within reach;with chair alarm set Nurse Communication: Mobility status         Time: [id number] PT Time Calculation (min) (ACUTE ONLY): 21 min   Charges:   PT Evaluation $Initial PT Evaluation Tier I: 1 Procedure     PT G CodesOlen Wolfe:        Natalie Wolfe 09/08/2014, 5:36 PM  Van ClinesHolly Natale Barba, PT  Acute Rehabilitation Services Pager [phone] Office [phone]

## 2014-09-09 ENCOUNTER — Encounter (HOSPITAL_COMMUNITY): Payer: Self-pay | Admitting: Vascular Surgery

## 2014-09-09 DIAGNOSIS — N178 Other acute kidney failure: Secondary | ICD-10-CM

## 2014-09-09 LAB — GLUCOSE, CAPILLARY
GLUCOSE-CAPILLARY: 103 mg/dL — AB (ref 70–99)
GLUCOSE-CAPILLARY: 92 mg/dL (ref 70–99)
Glucose-Capillary: 92 mg/dL (ref 70–99)

## 2014-09-09 LAB — C-PEPTIDE: C-Peptide: 7.2 ng/mL — ABNORMAL HIGH (ref 0.80–3.90)

## 2014-09-09 MED ORDER — LABETALOL HCL 100 MG PO TABS: 100.0000 mg | ORAL_TABLET | Freq: Two times a day (BID) | ORAL | Status: DC

## 2014-09-09 MED ORDER — CALCIUM ACETATE 667 MG PO CAPS: 667.0000 mg | ORAL_CAPSULE | Freq: Three times a day (TID) | ORAL | Status: DC

## 2014-09-09 MED ORDER — RENA-VITE PO TABS: 1.0000 | ORAL_TABLET | Freq: Every day | ORAL | Status: DC

## 2014-09-09 NOTE — Evaluation (Signed)
Occupational Therapy Evaluation and Discharge Patient Details Name: Natalie Wolfe MRN: [id number] DOB: 20-Apr-1930 Today's Date: 09/09/2014    History of Present Illness Natalie Wolfe is an 81 yo female who is from [place].  She has been complaining of b/l weakness in her legs and that progressed to all over weakness.  She has fallen after he knees gave out-landing on her right knee; Acute renal Failure with start of HD   Clinical Impression   This 81 yo female admitted with above presents to acute OT with at overall mod I to Independent level in this environment and is to be D/C'd today. No further OT needs, we will sign off.    Follow Up Recommendations  No OT follow up    Equipment Recommendations  None recommended by OT       Precautions / Restrictions Precautions Precautions: Fall Restrictions Weight Bearing Restrictions: No      Mobility Bed Mobility               General bed mobility comments: Pt up in recliner upon my arrival  Transfers Overall transfer level: Independent                    Balance    Pt had one episode of LOB with she took her first couple of steps and she was able to self correct by taking a cross over step.                                        ADL Overall ADL's : Modified independent                                                       Pertinent Vitals/Pain Pain Assessment: No/denies pain     Hand Dominance Right   Extremity/Trunk Assessment Upper Extremity Assessment Upper Extremity Assessment: Overall WFL for tasks assessed           Communication Communication Communication: No difficulties   Cognition Arousal/Alertness: Awake/alert Behavior During Therapy: WFL for tasks assessed/performed Overall Cognitive Status: Within Functional Limits for tasks assessed                                Home Living  Family/patient expects to be discharged to::  (Independent Living) Living Arrangements: Alone Available Help at Discharge: Family;Available PRN/intermittently Type of Home: Independent living facility Home Access: Level entry     Home Layout: One level     Bathroom Shower/Tub: Walk-in Pensions consultant: Standard     Home Equipment: None;Grab bars - toilet;Grab bars - tub/shower;Hand held shower head;Shower seat          Prior Functioning/Environment Level of Independence: Independent        Comments: but with recent falls    OT Diagnosis: Generalized weakness         OT Goals(Current goals can be found in the care plan section) Acute Rehab OT Goals Patient Stated Goal: home today  OT Frequency:                End of Session Equipment Utilized During Treatment: Gait belt Nurse Communication:  (  Pt wants to know if she will have to follow a special diet)  Activity Tolerance: Patient tolerated treatment well Patient left: in chair;with call bell/phone within reach;with chair alarm set   Time: [id number] OT Time Calculation (min): 12 min Charges:  OT General Charges $OT Visit: 1 Procedure OT Evaluation $Initial OT Evaluation Tier I: 1 Procedure  Evette GeorgesLeonard, Brielyn Bosak Eva [phone] 09/09/2014, 11:39 AM

## 2014-09-09 NOTE — Discharge Instructions (Signed)
° ° °  09/06/2014 Natalie ReaderGeorgianna Wolfe [id number] March 04, 1930  Surgeon(s): Pryor OchoaJames D Lawson, MD  Procedure(s): CREATION OF LEFT UPPER ARM ARTERIOVENOUS (AV) FISTULA  INSERTION OF DIALYSIS CATHETER RIGHT INTERNAL JUGULAR VEIN LIGATION OF COMPETING BRANCHES OF ARTERIOVENOUS FISTULA  x Do not stick fistula for 12 weeks     Follow diabetes diet. Check blood sugar daily. Follow up with PCP for diabetes.   [facility] [place] [facility] for T, THu, Sat, second shift.

## 2014-09-09 NOTE — Progress Notes (Signed)
Discharge instructions reviewed with pt; allowing time for questions. Pt verbalized understanding. IV removed without issue. Walker delivered to room by [facility]. Pt to leave the unit via wheelchair accompanied by volunteer.

## 2014-09-09 NOTE — Progress Notes (Addendum)
S: No new CO.  She wants to go home O:BP 137/50 mmHg  Pulse 79  Temp(Src) 98.3 F (36.8 C) (Oral)  Resp 18  Ht 5\' 5"  (1.651 m)  Wt 60.1 kg (132 lb 7.9 oz)  BMI 22.05 kg/m2  SpO2 98%  Intake/Output Summary (Last 24 hours) at 09/09/14 1104 Last data filed at 09/09/14 0842  Gross per 24 hour  Intake    480 ml  Output      0 ml  Net    480 ml   Weight change: -1.2 kg (-2 lb 10.3 oz) ZOX:WRUEAGen:awake and alert CVS: RRR Resp:Decreased BS bases Abd:+ BS NTND Ext: 0-tr edema.  LUA AVF + bruit NEURO:CNI Ox3 no asterixis Rt IJ perm cath   . antiseptic oral rinse  7 mL Mouth Rinse BID  . calcium acetate  667 mg Oral TID WC  . [START ON 09/11/2014] darbepoetin (ARANESP) injection - DIALYSIS  40 mcg Intravenous Q Wed-HD  . ferric gluconate (FERRLECIT/NULECIT) IV  125 mg Intravenous Q M,W,F-HD  . heparin  5,000 Units Subcutaneous 3 times per day  . insulin aspart  0-5 Units Subcutaneous QHS  . insulin aspart  0-9 Units Subcutaneous TID WC  . labetalol  100 mg Oral BID  . multivitamin  1 tablet Oral QHS  . rosuvastatin  20 mg Oral q1800   No results found. BMET    Component Value Date/Time   NA 139 09/07/2014 1200   NA 134 08/29/2014 0842   K 3.4* 09/07/2014 1200   CL 103 09/07/2014 1200   CO2 26 09/07/2014 1200   GLUCOSE 139* 09/07/2014 1200   GLUCOSE 92 08/29/2014 0842   BUN 35* 09/07/2014 1200   BUN 95* 08/29/2014 0842   CREATININE 6.21* 09/07/2014 1200   CALCIUM 8.0* 09/07/2014 1200   GFRNONAA 6* 09/07/2014 1200   GFRAA 7* 09/07/2014 1200   CBC    Component Value Date/Time   WBC 10.2 09/07/2014 1200   WBC 8.5 08/29/2014 0842   RBC 3.14* 09/07/2014 1200   RBC 3.71* 08/29/2014 0842   HGB 8.5* 09/07/2014 1200   HCT 26.7* 09/07/2014 1200   HCT 23.3* 09/01/2014 0957   PLT 222 09/07/2014 1200   MCV 85.0 09/07/2014 1200   MCH 27.1 09/07/2014 1200   MCH 25.1* 08/29/2014 0842   MCHC 31.8 09/07/2014 1200   MCHC 32.5 08/29/2014 0842   RDW 20.7* 09/07/2014 1200   RDW 18.3*  08/29/2014 0842   LYMPHSABS 0.2* 08/31/2014 0633   LYMPHSABS 0.6* 08/29/2014 0842   MONOABS 0.3 08/31/2014 0633   EOSABS 0.0 08/31/2014 0633   EOSABS 0.1 08/29/2014 0842   BASOSABS 0.0 08/31/2014 0633   BASOSABS 0.0 08/29/2014 0842     Assessment: 1. New ESRD 2. Sec HPTH not requiring Vit D yet 3. DM 4. HTN 5. Anemia on aranesp  Plan: 1.  [facility]   3.5hr, DW 59kg.  She [facility] be DC'd today and [facility].   Jameal Razzano T

## 2014-09-09 NOTE — Care Management Note (Signed)
CARE MANAGEMENT NOTE 09/09/2014  Patient:  Natalie Wolfe, Natalie Wolfe   Account Number:  [id number]  Date Initiated:  09/03/2014  Documentation initiated by:  [facility]  Subjective/Objective Assessment:   Essential hypertension  Anemia of chronic disease  Acute renal failure     Action/Plan:   09/09/14 Met with pt who declined HHPT as she has an exercise routine which she plans to continue. Pt has transportation.   Anticipated DC Date:  09/09/2014   Anticipated DC Plan:  [place]  CM consult      Choice offered to / List presented to:             Status of service:  In process, will continue to follow Medicare Important Message given?  YES (If response is "NO", the following Medicare IM given date fields will be blank) Date Medicare IM given:  09/03/2014 Medicare IM given by:  [facility] Date Additional Medicare IM given:  09/09/2014 Additional Medicare IM given by:  [facility]  Discharge Disposition:  HOME/SELF CARE  Per UR Regulation:    If discussed at Long Length of Stay Meetings, dates discussed:    Comments:

## 2014-09-09 NOTE — Discharge Summary (Signed)
Physician Discharge Summary  Natalie Wolfe PPI:[id number] DOB: Oct 23, 1929 DOA: 08/31/2014  PCP: Estill Dooms, MD  Admit date: 08/31/2014 Discharge date: 09/09/2014  Time spent: 35 minutes  Recommendations for Outpatient Follow-up:  Follow up with out patient dialysis on T, Thu, Sat.  Need chest x ray to document resolution of pleural effusion.  Need to continue to monitor CBG, if hyperglycemia might need to start medications.   Discharge Diagnoses:    New ESRD.    Hypoglycemia, in setting sulfonylurea and renal failure.    Essential hypertension   Anemia of chronic disease   Acute renal failure   AKI (acute kidney injury)   SOB (shortness of breath)   Discharge Condition: stable, improved.   Diet recommendation: Carb Modified diet.   Filed Weights   09/07/14 1130 09/07/14 1610 09/08/14 2157  Weight: 61.3 kg (135 lb 2.3 oz) 59.6 kg (131 lb 6.3 oz) 60.1 kg (132 lb 7.9 oz)    History of present illness:  She has been complaining of b/l weakness in her legs and that progressed to all over weakness. She has fallen after he knees gave out. Denies LOC and denies hitting head. She has not been able to eat well for the last month as she had several teeth pulled and has trouble eating. Yesterday, she only drank coffee. + dizziness, no fever, no chills, no SOB, no CP  Her Cr was checked on 1/21 and found to be 9- elevated from her baseline of about 2. Today in the ER, her Cr is 11. Her blood sugars have also been running low due to the AKI and decreased clearance of he PO Meds. Foley placed in ER with minimal output. Given 500 cc in ER  Hospital Course:  ESRD, new on dialysis. AKI on CKD- renal U/S, medical renal disease, no obstruction.  -renal consulted, started on HD 09/04/14- -Patient breathing has improved dramatically since hemodialysis.  -S/P  permanent catheter and fistula/graft to be place today 1/29 if patient remains stable -nephrology and vascular surgery on  board; will follow their rec's -Outpatient hemodialysis appointment set up.  Hyperkalemia: in the setting of dehydration and AKI on CKD,  -Potassium within normal limits after hemodialysis has been initiated  Pleural effusion and congestion. -plan is to continue HD  -echocardiogram showing diastolic heart failure; but there is not wall motion abnormalities -need repeat chest x ray 1 week.   DM- Hypoglycemia:  Due to poor PO intake in setting of worsening renal failure Stable now and without any further episodes of hypoglycemia Will monitor closely and adjust hypoglycemic regimen as needed Her blood sugar has remain in the 100 range. She has not required insulin. She will be discharge off diabetes medication.  Need close follow up.   Diarrhea; resolved.  -c.diff negative -will continue to monitor -No further episodes of diarrhea at this moment -received kayexalate on admission  HTN- continue IV PRN -will continue labetalol 2 times a day  Anemia: microcystic. Anemia work up in progress, no indication for transfusion. No overt bleed.  -Received 2 units of PRBCs on 09/04/2014 during dialysis -IV iron and ESA started by nephrology -Hemoglobin at 8.8   Procedures:  New on dialysis   Consultations:  nephrologist  Discharge Exam: Filed Vitals:   09/09/14 0930  BP: 137/50  Pulse: 79  Temp: 98.3 F (36.8 C)  Resp: 18    General: Alert in no distress.  Cardiovascular: S 1, S 2 RRR Respiratory: CTA  Discharge Instructions   Discharge  Instructions    Diet Carb Modified    Complete by:  As directed      Increase activity slowly    Complete by:  As directed           Current Discharge Medication List    START taking these medications   Details  calcium acetate (PHOSLO) 667 MG capsule Take 1 capsule (667 mg total) by mouth 3 (three) times daily with meals. Qty: 90 capsule, Refills: 0    labetalol (NORMODYNE) 100 MG tablet Take 1 tablet (100 mg total) by  mouth 2 (two) times daily. Qty: 30 tablet, Refills: 0    multivitamin (RENA-VIT) TABS tablet Take 1 tablet by mouth at bedtime. Qty: 30 each, Refills: 0      CONTINUE these medications which have NOT CHANGED   Details  acetaminophen (TYLENOL) 325 MG tablet Take 650 mg by mouth daily as needed for mild pain.     !! fluocinonide cream (LIDEX) 9.16 % Apply 1 application topically 2 (two) times daily. Apply from neck down twice a day (provided by dermatology).    !! fluocinonide cream (LIDEX) 3.84 % Apply 1 application topically 2 (two) times daily.    hydrocortisone 2.5 % lotion Apply 1 application topically 2 (two) times daily. Apply to skin and face    rosuvastatin (CRESTOR) 20 MG tablet Take 20 mg by mouth daily.    glucose blood (BAYER CONTOUR NEXT TEST) test strip Use as instructed Qty: 100 each, Refills: 12   Associated Diagnoses: Type II or unspecified type diabetes mellitus with renal manifestations, not stated as uncontrolled     !! - Potential duplicate medications found. Please discuss with provider.    STOP taking these medications     amLODipine (NORVASC) 5 MG tablet      benazepril (LOTENSIN) 20 MG tablet      glimepiride (AMARYL) 2 MG tablet      solifenacin (VESICARE) 5 MG tablet      BAYER MICROLET LANCETS lancets      Blood Glucose Monitoring Suppl (CONTOUR NEXT EZ MONITOR) W/DEVICE KIT        No Known Allergies Follow-up Information    Follow up with Tinnie Gens, MD In 6 weeks.   Specialty:  Vascular Surgery   Why:  Office will call you to arrange your appt (sent)   Contact information:   [place] [phone]       Follow up with GREEN, Viviann Spare, MD In 1 week.   Specialty:  Internal Medicine   Contact information:   [place] [address] [phone]        The results of significant diagnostics from this hospitalization (including imaging, microbiology, ancillary and laboratory) are listed  below for reference.    Significant Diagnostic Studies: Dg Chest 2 View  09/03/2014   CLINICAL DATA:  81 year old female with shortness breath for 2-3 days.  EXAM: CHEST  2 VIEW  COMPARISON:  Chest x-ray 09/01/2014.  FINDINGS: There is cephalization of the pulmonary vasculature and slight indistinctness of the interstitial markings suggestive of mild pulmonary edema. Small left pleural effusion. Opacity in the medial aspect of the left lung base may reflect atelectasis and/or consolidation. Heart size appears normal. Upper mediastinal contours are within normal limits. Atherosclerosis in the thoracic aorta.  IMPRESSION: 1. There appears to be mild interstitial pulmonary edema. Given the normal heart size, clinical evaluation for cause of noncardiogenic pulmonary edema suggested. 2. Small left pleural effusion.  3. Developing opacity in the medial aspect of the left lung base may reflect underlying atelectasis, or airspace consolidation from infection or aspiration. 4. Atherosclerosis.   Electronically Signed   By: Vinnie Langton M.D.   On: 09/03/2014 13:33   Dg Chest 2 View  08/31/2014   CLINICAL DATA:  Weakness for 2 days  EXAM: CHEST  2 VIEW  COMPARISON:  None.  FINDINGS: Cardiac shadow is within normal limits. The lungs are well aerated bilaterally. Very minimal atelectatic changes are noted in the posterior costophrenic angle on the lateral projection likely in the posterior right lower lobe.  IMPRESSION: Minimal right lower lobe atelectasis.   Electronically Signed   By: Inez Catalina M.D.   On: 08/31/2014 07:13   US Renal  08/31/2014   CLINICAL DATA:  Acute kidney injury. Chronic kidney disease stage 2. Diabetes and hypertension  EXAM: RENAL/URINARY TRACT ULTRASOUND COMPLETE  COMPARISON:  None.  FINDINGS: Right Kidney:  Length: 7 cm. Increased parenchymal echogenicity. Inferior pole cyst measures 6 mm. No mass or hydronephrosis noted.  Left Kidney:  Length: 9.1 cm. Increased parenchymal echogenicity.  Inferior pole cyst measures 3.1 x 3.1 x 3.0 cm. No mass or hydronephrosis.  Bladder:  Collapsed around a Foley catheter.  Other:  Bilateral pleural effusions noted.  IMPRESSION: 1. No obstructive uropathy. 2. Bilateral renal atrophy and increased parenchymal echogenicity compatible with chronic medical renal disease.   Electronically Signed   By: Kerby Moors M.D.   On: 08/31/2014 12:57   Dg Chest Port 1 View  09/06/2014   CLINICAL DATA:  Status post diatek catheter placement.  EXAM: PORTABLE CHEST - 1 VIEW  COMPARISON:  09/04/2014  FINDINGS: The right IJ diatek catheter is in good position. The distal tip is at the cavoatrial junction and the proximal tip is in the distal SVC. No pneumothorax or hematoma. The lungs show improved aeration when compared to prior chest film. There is persistent perihilar pulmonary edema and vascular congestion. No definite pleural effusions.  IMPRESSION: Right IJ dialysis catheter in good position without complicating features.  Improved aeration since prior study.   Electronically Signed   By: Kalman Jewels M.D.   On: 09/06/2014 13:21   Dg Chest Port 1 View  09/04/2014   CLINICAL DATA:  81 year old female with shortness of Breath. Initial encounter.  EXAM: PORTABLE CHEST - 1 VIEW  COMPARISON:  09/03/2014 and earlier.  FINDINGS: Portable AP semi upright view at 0702 hrs. Increased basilar predominant interstitial opacity and obscuration of the diaphragm. Increased veiling opacity at both lung bases. Stable cardiac size and mediastinal contours. No pneumothorax. Visualized tracheal air column is within normal limits.  IMPRESSION: Progressed pulmonary edema, bilateral pleural effusions, and bibasilar atelectasis.   Electronically Signed   By: Lars Pinks M.D.   On: 09/04/2014 07:21   Dg Chest Port 1 View  09/01/2014   CLINICAL DATA:  Shortness of breath. Stage IV renal disease due to diabetes.  EXAM: PORTABLE CHEST - 1 VIEW  COMPARISON:  08/31/2014  FINDINGS: Normal heart  size. Pulmonary vascularity is increasing since prior study with development of slight interstitial pattern in the lung bases. This suggest developing edema. Small left pleural effusion is also developing. Calcification of aorta. No pneumothorax. Mediastinal contours are intact.  IMPRESSION: Developing pulmonary vascular congestion edema with small left pleural effusion.   Electronically Signed   By: Lucienne Capers M.D.   On: 09/01/2014 22:27   Dg Fluoro Guide Cv Line-no Report  09/06/2014  CLINICAL DATA:    FLOURO GUIDE CV LINE  Fluoroscopy was utilized by the requesting physician.  No radiographic  interpretation.     Microbiology: Recent Results (from the past 240 hour(s))  MRSA PCR Screening     Status: None   Collection Time: 09/01/14  3:00 AM  Result Value Ref Range Status   MRSA by PCR NEGATIVE NEGATIVE Final    Comment:        The GeneXpert MRSA Assay (FDA approved for NASAL specimens only), is one component of a comprehensive MRSA colonization surveillance program. It is not intended to diagnose MRSA infection nor to guide or monitor treatment for MRSA infections.   Clostridium Difficile by PCR     Status: None   Collection Time: 09/01/14 12:30 PM  Result Value Ref Range Status   C difficile by pcr NEGATIVE NEGATIVE Final  Surgical pcr screen     Status: None   Collection Time: 09/03/14  8:27 PM  Result Value Ref Range Status   MRSA, PCR NEGATIVE NEGATIVE Final   Staphylococcus aureus NEGATIVE NEGATIVE Final    Comment:        The Xpert SA Assay (FDA approved for NASAL specimens in patients over 17 years of age), is one component of a comprehensive surveillance program.  Test performance has been validated by [facility] for patients greater than or equal to 54 year old. It is not intended to diagnose infection nor to guide or monitor treatment.      Labs: Basic Metabolic Panel:  Recent Labs Lab 09/03/14 0610 09/04/14 0624 09/05/14 0746 09/06/14 0440  09/07/14 1200  NA 139 137 139 141 139  K 4.6 4.6 4.4 3.9 3.4*  CL 104 102 103 103 103  CO2 19 23 21 27 26   GLUCOSE 129* 172* 97 104* 139*  BUN 99* 101* 56* 42* 35*  CREATININE 9.40* 9.11* 7.00* 6.14* 6.21*  CALCIUM 8.7 8.7 8.7 8.7 8.0*  PHOS  --   --  6.2*  --  4.6   Liver Function Tests:  Recent Labs Lab 09/05/14 0746 09/07/14 1200  ALBUMIN 2.6* 2.6*   No results for input(s): LIPASE, AMYLASE in the last 168 hours. No results for input(s): AMMONIA in the last 168 hours. CBC:  Recent Labs Lab 09/03/14 0610 09/04/14 0624 09/05/14 0746 09/06/14 0440 09/07/14 1200  WBC 11.7* 19.0* 12.7* 10.2 10.2  HGB 7.0* 10.0* 9.3* 8.8* 8.5*  HCT 21.6* 30.5* 28.6* 28.2* 26.7*  MCV 79.7 81.6 81.5 84.2 85.0  PLT 188 226 239 264 222   Cardiac Enzymes: No results for input(s): CKTOTAL, CKMB, CKMBINDEX, TROPONINI in the last 168 hours. BNP: BNP (last 3 results) No results for input(s): PROBNP in the last 8760 hours. CBG:  Recent Labs Lab 09/08/14 1205 09/08/14 1704 09/08/14 2032 09/09/14 0001 09/09/14 0718  GLUCAP 109* 111* 108* 103* 92       Signed:  Ulla Mckiernan A  [facility] 09/09/2014, 12:07 PM

## 2014-09-12 ENCOUNTER — Inpatient Hospital Stay (HOSPITAL_COMMUNITY)
Admission: EM | Admit: 2014-09-12 | Discharge: 2014-09-18 | DRG: 286 | Disposition: A | Discharge status: Home / Self Care | Payer: Medicare Other | Attending: Internal Medicine | Admitting: Internal Medicine

## 2014-09-12 ENCOUNTER — Encounter (HOSPITAL_COMMUNITY): Payer: Self-pay | Admitting: *Deleted

## 2014-09-12 ENCOUNTER — Emergency Department (HOSPITAL_COMMUNITY): Payer: Medicare Other

## 2014-09-12 DIAGNOSIS — I959 Hypotension, unspecified: Secondary | ICD-10-CM | POA: Diagnosis present

## 2014-09-12 DIAGNOSIS — D638 Anemia in other chronic diseases classified elsewhere: Secondary | ICD-10-CM | POA: Diagnosis present

## 2014-09-12 DIAGNOSIS — I120 Hypertensive chronic kidney disease with stage 5 chronic kidney disease or end stage renal disease: Secondary | ICD-10-CM | POA: Diagnosis present

## 2014-09-12 DIAGNOSIS — R0602 Shortness of breath: Secondary | ICD-10-CM

## 2014-09-12 DIAGNOSIS — I469 Cardiac arrest, cause unspecified: Secondary | ICD-10-CM | POA: Diagnosis present

## 2014-09-12 DIAGNOSIS — I491 Atrial premature depolarization: Secondary | ICD-10-CM | POA: Diagnosis present

## 2014-09-12 DIAGNOSIS — Z87891 Personal history of nicotine dependence: Secondary | ICD-10-CM

## 2014-09-12 DIAGNOSIS — I472 Ventricular tachycardia: Secondary | ICD-10-CM | POA: Diagnosis not present

## 2014-09-12 DIAGNOSIS — Z833 Family history of diabetes mellitus: Secondary | ICD-10-CM

## 2014-09-12 DIAGNOSIS — I2510 Atherosclerotic heart disease of native coronary artery without angina pectoris: Secondary | ICD-10-CM | POA: Diagnosis present

## 2014-09-12 DIAGNOSIS — IMO0001 Reserved for inherently not codable concepts without codable children: Secondary | ICD-10-CM

## 2014-09-12 DIAGNOSIS — I10 Essential (primary) hypertension: Secondary | ICD-10-CM | POA: Diagnosis present

## 2014-09-12 DIAGNOSIS — R55 Syncope and collapse: Secondary | ICD-10-CM | POA: Diagnosis present

## 2014-09-12 DIAGNOSIS — IMO0002 Reserved for concepts with insufficient information to code with codable children: Secondary | ICD-10-CM | POA: Diagnosis present

## 2014-09-12 DIAGNOSIS — I739 Peripheral vascular disease, unspecified: Secondary | ICD-10-CM | POA: Diagnosis present

## 2014-09-12 DIAGNOSIS — I5040 Unspecified combined systolic (congestive) and diastolic (congestive) heart failure: Secondary | ICD-10-CM | POA: Diagnosis present

## 2014-09-12 DIAGNOSIS — D631 Anemia in chronic kidney disease: Secondary | ICD-10-CM | POA: Diagnosis present

## 2014-09-12 DIAGNOSIS — Z992 Dependence on renal dialysis: Secondary | ICD-10-CM

## 2014-09-12 DIAGNOSIS — E11649 Type 2 diabetes mellitus with hypoglycemia without coma: Secondary | ICD-10-CM | POA: Diagnosis present

## 2014-09-12 DIAGNOSIS — Z90710 Acquired absence of both cervix and uterus: Secondary | ICD-10-CM

## 2014-09-12 DIAGNOSIS — E1129 Type 2 diabetes mellitus with other diabetic kidney complication: Secondary | ICD-10-CM | POA: Diagnosis present

## 2014-09-12 DIAGNOSIS — Z0389 Encounter for observation for other suspected diseases and conditions ruled out: Secondary | ICD-10-CM

## 2014-09-12 DIAGNOSIS — R778 Other specified abnormalities of plasma proteins: Secondary | ICD-10-CM | POA: Diagnosis present

## 2014-09-12 DIAGNOSIS — R7989 Other specified abnormal findings of blood chemistry: Secondary | ICD-10-CM | POA: Diagnosis present

## 2014-09-12 DIAGNOSIS — E1165 Type 2 diabetes mellitus with hyperglycemia: Secondary | ICD-10-CM

## 2014-09-12 DIAGNOSIS — N186 End stage renal disease: Secondary | ICD-10-CM | POA: Diagnosis present

## 2014-09-12 DIAGNOSIS — D696 Thrombocytopenia, unspecified: Secondary | ICD-10-CM | POA: Diagnosis present

## 2014-09-12 DIAGNOSIS — I420 Dilated cardiomyopathy: Secondary | ICD-10-CM | POA: Diagnosis present

## 2014-09-12 DIAGNOSIS — E785 Hyperlipidemia, unspecified: Secondary | ICD-10-CM | POA: Diagnosis present

## 2014-09-12 DIAGNOSIS — E1122 Type 2 diabetes mellitus with diabetic chronic kidney disease: Secondary | ICD-10-CM | POA: Diagnosis present

## 2014-09-12 DIAGNOSIS — N2581 Secondary hyperparathyroidism of renal origin: Secondary | ICD-10-CM | POA: Diagnosis present

## 2014-09-12 LAB — CBC
HEMATOCRIT: 26.6 % — AB (ref 36.0–46.0)
HEMOGLOBIN: 8.2 g/dL — AB (ref 12.0–15.0)
MCH: 27.2 pg (ref 26.0–34.0)
MCHC: 30.8 g/dL (ref 30.0–36.0)
MCV: 88.4 fL (ref 78.0–100.0)
PLATELETS: 49 10*3/uL — AB (ref 150–400)
RBC: 3.01 MIL/uL — ABNORMAL LOW (ref 3.87–5.11)
RDW: 18.9 % — AB (ref 11.5–15.5)
WBC: 14.8 10*3/uL — ABNORMAL HIGH (ref 4.0–10.5)

## 2014-09-12 LAB — COMPREHENSIVE METABOLIC PANEL
ALT: 51 U/L — ABNORMAL HIGH (ref 0–35)
AST: 265 U/L — ABNORMAL HIGH (ref 0–37)
Albumin: 2.9 g/dL — ABNORMAL LOW (ref 3.5–5.2)
Alkaline Phosphatase: 73 U/L (ref 39–117)
Anion gap: 12 (ref 5–15)
BUN: 20 mg/dL (ref 6–23)
CALCIUM: 8.2 mg/dL — AB (ref 8.4–10.5)
CO2: 24 mmol/L (ref 19–32)
Chloride: 101 mmol/L (ref 96–112)
Creatinine, Ser: 8.08 mg/dL — ABNORMAL HIGH (ref 0.50–1.10)
GFR calc Af Amer: 5 mL/min — ABNORMAL LOW (ref >90)
GFR calc non Af Amer: 4 mL/min — ABNORMAL LOW (ref >90)
GLUCOSE: 112 mg/dL — AB (ref 70–99)
Potassium: 4.5 mmol/L (ref 3.5–5.1)
Sodium: 137 mmol/L (ref 135–145)
Total Bilirubin: 0.3 mg/dL (ref 0.3–1.2)
Total Protein: 6 g/dL (ref 6.0–8.3)

## 2014-09-12 LAB — DIC (DISSEMINATED INTRAVASCULAR COAGULATION) PANEL
D DIMER QUANT: 9.57 ug{FEU}/mL — AB (ref 0.00–0.48)
FIBRINOGEN: 552 mg/dL — AB (ref 204–475)
INR: 1.15 (ref 0.00–1.49)
PLATELETS: 74 10*3/uL — AB (ref 150–400)
SMEAR REVIEW: NONE SEEN
aPTT: 45 seconds — ABNORMAL HIGH (ref 24–37)

## 2014-09-12 LAB — I-STAT TROPONIN, ED: Troponin i, poc: 0.03 ng/mL (ref 0.00–0.08)

## 2014-09-12 LAB — DIC (DISSEMINATED INTRAVASCULAR COAGULATION)PANEL: Prothrombin Time: 14.8 seconds (ref 11.6–15.2)

## 2014-09-12 LAB — TSH: TSH: 3.464 u[IU]/mL (ref 0.350–4.500)

## 2014-09-12 LAB — CBG MONITORING, ED: Glucose-Capillary: 84 mg/dL (ref 70–99)

## 2014-09-12 LAB — GLUCOSE, CAPILLARY: Glucose-Capillary: 68 mg/dL — ABNORMAL LOW (ref 70–99)

## 2014-09-12 MED ORDER — NA FERRIC GLUC CPLX IN SUCROSE 12.5 MG/ML IV SOLN: 125.0000 mg | INTRAVENOUS | Status: DC

## 2014-09-12 MED ORDER — ACETAMINOPHEN 650 MG RE SUPP: 650.0000 mg | Freq: Four times a day (QID) | RECTAL | Status: DC | PRN

## 2014-09-12 MED ORDER — ACETAMINOPHEN 325 MG PO TABS: 650.0000 mg | ORAL_TABLET | Freq: Four times a day (QID) | ORAL | Status: DC | PRN

## 2014-09-12 MED ORDER — HYDROCODONE-ACETAMINOPHEN 5-325 MG PO TABS: 1.0000 | ORAL_TABLET | ORAL | Status: DC | PRN

## 2014-09-12 MED ORDER — DARBEPOETIN ALFA 40 MCG/0.4ML IJ SOSY: 40.0000 ug | PREFILLED_SYRINGE | INTRAMUSCULAR | Status: DC

## 2014-09-12 MED ORDER — SODIUM CHLORIDE 0.9 % IJ SOLN
3.0000 mL | Freq: Two times a day (BID) | INTRAMUSCULAR | Status: DC
  Administered 2014-09-13 – 2014-09-18 (×9): 3 mL via INTRAVENOUS

## 2014-09-12 MED ORDER — INSULIN ASPART 100 UNIT/ML ~~LOC~~ SOLN
0.0000 [IU] | Freq: Three times a day (TID) | SUBCUTANEOUS | Status: DC
  Administered 2014-09-14 – 2014-09-16 (×3): 2 [IU] via SUBCUTANEOUS
  Administered 2014-09-18: 1 [IU] via SUBCUTANEOUS

## 2014-09-12 NOTE — ED Notes (Signed)
Patient was at the dialysis center and according to ems staff states patient became unresponsive and they were unable to feel a pulse CPR was started and after approx. 2 mins. Regained pulses. Upon arrival of ems they state patient was altered and only responsive to painful stimuli. With snoring resp. enroute patient became more responsive. Upon arrival to ed patient alert oriented to name, place and year. C/o left arm pain and being cold. Left arm looks like a recent fisula insertion . Patient has dialysis cath right uper chest. That dialysis center had accessed with 1000cc NS>

## 2014-09-12 NOTE — ED Notes (Signed)
Pt returned from xray

## 2014-09-12 NOTE — Consult Note (Signed)
[facility] Renal Consultation Note    Indication for Consultation:  Management of ESRD/hemodialysis; anemia, hypertension/volume and secondary hyperparathyroidism PCP:  HPI: Natalie Wolfe is a 81 y.o. female with new ESRD just discharged to home 2/1 who dialyzed uneventfully at her dialysis center 2/2, rode the [place] today, was feeling well and  walked in to dialysis.  She was about 5 minutes into her treatment with a stable BP when she complained of SOB, pt became unresponsive without a pulse and CPR was initiated.  Pulse was regained. EMS arrived.  She continued to have AMS with limited responsiveness than by report improved in transit to the ED.  The patient at present feels fine and has no recollection of the event or preceding events.  She has otherwise been doing well without CP or SOB since her last HD treatment. She does have soreness in her sternal area from CPR.  She had an uneventful dialysis session on 09/10/14 and the only change has been that she started taking labetalol  bid and phoslo 667 qac tid this morning.  She remembers feeling SOB once dialysis started but doesn't remember anything else.  She said she felt well prior to dialysis but didn't like the new medications.  She came around from CPR within 5 minutes and is now at baseline without any c/o SOB or dizziness.  Past Medical History  Diagnosis Date  . Urinary frequency   . Routine general medical examination at a health care facility   . Other atopic dermatitis and related conditions   . Rash and other nonspecific skin eruption   . Other anxiety states   . Insomnia, unspecified   . Chronic kidney disease, stage II (mild)   . Type II or unspecified type diabetes mellitus with renal manifestations, not stated as uncontrolled   . PVD (peripheral vascular disease)   . Unspecified vitamin D deficiency   . Anemia, unspecified   . Obesity, unspecified   . Nocturia   . Pain in joint, lower leg   . Other  and unspecified hyperlipidemia   . Unspecified essential hypertension   . Cataract    Past Surgical History  Procedure Laterality Date  . Abdominal hysterectomy  1970  . Breast surgery  1979    nodule removed  . Eye surgery  2004    catarcts removed from right eye.  . Multiple tooth extractions  09/2013    Remonve remaining 3 teeth, no with dentures   . Av fistula placement Left 09/06/2014    Procedure: CREATION OF LEFT UPPER ARM ARTERIOVENOUS (AV) FISTULA ;  Surgeon: Pryor Ochoa, MD;  Location: [facility] OR;  Service: Vascular;  Laterality: Left;  . Insertion of dialysis catheter N/A 09/06/2014    Procedure: INSERTION OF DIALYSIS CATHETER RIGHT INTERNAL JUGULAR VEIN;  Surgeon: Pryor Ochoa, MD;  Location: [facility] OR;  Service: Vascular;  Laterality: N/A;  . Ligation of competing branches of arteriovenous fistula Left 09/06/2014    Procedure: LIGATION OF COMPETING BRANCHES OF ARTERIOVENOUS FISTULA;  Surgeon: Pryor Ochoa, MD;  Location: [facility] OR;  Service: Vascular;  Laterality: Left;   Family History  Problem Relation Age of Onset  . Diabetes Mother    Social History:  reports that she quit smoking about 14 years ago. Her smoking use included Cigarettes. She does not have any smokeless tobacco history on file. She reports that she does not drink alcohol or use illicit drugs. No Known Allergies Prior to Admission medications   Medication Sig  Start Date End Date Taking? Authorizing Provider  acetaminophen (TYLENOL) 325 MG tablet Take 650 mg by mouth daily as needed for mild pain.    Yes Historical Provider, MD  amLODipine (NORVASC) 5 MG tablet Take 5 mg by mouth daily. 08/16/14  Yes Historical Provider, MD  calcium acetate (PHOSLO) 667 MG capsule Take 1 capsule (667 mg total) by mouth 3 (three) times daily with meals. 09/09/14  Yes Belkys A Regalado, MD  fluocinonide cream (LIDEX) 0.05 % Apply 1 application topically 2 (two) times daily. Apply from neck down twice a day (provided by dermatology).    Yes Historical Provider, MD  glimepiride (AMARYL) 2 MG tablet Take 1 mg by mouth daily. 08/30/14  Yes Historical Provider, MD  glucose blood (BAYER CONTOUR NEXT TEST) test strip Use as instructed 11/27/12  Yes Cathey Miller, RPH-CPP  hydrocortisone 2.5 % lotion Apply 1 application topically 2 (two) times daily. Apply to skin and face 10/18/12  Yes Historical Provider, MD  labetalol (NORMODYNE) 100 MG tablet Take 1 tablet (100 mg total) by mouth 2 (two) times daily. 09/09/14  Yes Belkys A Regalado, MD  multivitamin (RENA-VIT) TABS tablet Take 1 tablet by mouth at bedtime. 09/09/14  Yes Belkys A Regalado, MD  VESICARE 5 MG tablet Take 5 mg by mouth daily. 08/20/14  Yes Historical Provider, MD  rosuvastatin (CRESTOR) 20 MG tablet Take 20 mg by mouth daily.    Historical Provider, MD   No current facility-administered medications for this encounter.   Current Outpatient Prescriptions  Medication Sig Dispense Refill  . acetaminophen (TYLENOL) 325 MG tablet Take 650 mg by mouth daily as needed for mild pain.     Marland Kitchen amLODipine (NORVASC) 5 MG tablet Take 5 mg by mouth daily.    . calcium acetate (PHOSLO) 667 MG capsule Take 1 capsule (667 mg total) by mouth 3 (three) times daily with meals. 90 capsule 0  . fluocinonide cream (LIDEX) 0.05 % Apply 1 application topically 2 (two) times daily. Apply from neck down twice a day (provided by dermatology).    Marland Kitchen glimepiride (AMARYL) 2 MG tablet Take 1 mg by mouth daily.    Marland Kitchen glucose blood (BAYER CONTOUR NEXT TEST) test strip Use as instructed 100 each 12  . hydrocortisone 2.5 % lotion Apply 1 application topically 2 (two) times daily. Apply to skin and face    . labetalol (NORMODYNE) 100 MG tablet Take 1 tablet (100 mg total) by mouth 2 (two) times daily. 30 tablet 0  . multivitamin (RENA-VIT) TABS tablet Take 1 tablet by mouth at bedtime. 30 each 0  . VESICARE 5 MG tablet Take 5 mg by mouth daily.    . rosuvastatin (CRESTOR) 20 MG tablet Take 20 mg by mouth daily.      Labs: Basic Metabolic Panel:  Recent Labs Lab 09/06/14 0440 09/07/14 1200 09/12/14 1253  NA 141 139 137  K 3.9 3.4* 4.5  CL 103 103 101  CO2 GLUCOSE 104* 139* 112*  BUN 42* 35* 20  CREATININE 6.14* 6.21* 8.08*  CALCIUM 8.7 8.0* 8.2*  PHOS  --  4.6  --    Liver Function Tests:  Recent Labs Lab 09/07/14 1200 09/12/14 1253  AST  --  265*  ALT  --  51*  ALKPHOS  --  73  BILITOT  --  0.3  PROT  --  6.0  ALBUMIN 2.6* 2.9*  CBC:  Recent Labs Lab 09/06/14 0440 09/07/14 1200 09/12/14 1253  WBC  10.2 10.2 14.8*  HGB 8.8* 8.5* 8.2*  HCT 28.2* 26.7* 26.6*  MCV 84.2 85.0 88.4  PLT 264 222 49*   Cardiac Enzymes: No results for input(s): CKTOTAL, CKMB, CKMBINDEX, TROPONINI in the last 168 hours. CBG:  Recent Labs Lab 09/08/14 2032 09/09/14 0001 09/09/14 0718 09/09/14 1144 09/12/14 1410  GLUCAP 108* 103* 92 92 84   Studies/Results: Dg Chest 2 View  09/12/2014   CLINICAL DATA:  Syncope.  EXAM: CHEST  2 VIEW  COMPARISON:  September 06, 2014.  FINDINGS: The heart size and mediastinal contours are within normal limits. Both lungs are clear. No pneumothorax or pleural effusion is noted. Right internal jugular dialysis catheter is again noted with distal tip in expected position of the SVC. The visualized skeletal structures are unremarkable.  IMPRESSION: No acute cardiopulmonary abnormality seen.   Electronically Signed   By: Roque LiasJames  Green M.D.   On: 09/12/2014 14:08    ROS: As per HPI otherwise negative. Physical Exam: Filed Vitals:   09/12/14 1405 09/12/14 1415 09/12/14 1430 09/12/14 1505  BP:  168/63 165/77 160/66  Pulse:   89   Temp:      TempSrc:      Resp: 25 17 22 24   SpO2:   100%      General: Well developed, alert elderly lady  in no acute distress. Head: Normocephalic, atraumatic, sclera non-icteric, mucus membranes are moist Neck: Supple. JVD not elevated. Lungs:  Brusiing in sternal area. Clear bilaterally to auscultation without wheezes,  rales, or rhonchi. Breathing is unlabored. Heart: RRR with S1 S2. No murmurs, rubs, or gallops appreciated. Abdomen: Soft, non-tender, non-distended with normoactive bowel sounds M-S:  Strength and tone appear normal for age. Lower extremities:without edema or ischemic changes, no open wounds  Neuro: Alert and oriented X 3. Moves all extremities spontaneously. Psych:  Responds to questions appropriately with a normal affect. Dialysis Access: right IJ and left upper AVF + bruit  Dialysis Orders: [facility] -TTS 3.5 hr [facility] had first tmt there 2/2 3.5 hr EDW 59 - left at 58.7 3 K 2.25 Ca left AVF, right IJ hearpin 300 Aranesp 40 start 2/4 venofer 100 x 8  Assessment/Plan: 1. Cardiorespiratory arrest - 5 min into HD today, EKG no acute change, initial troponin 0.03 2. ESRD -  TTS - HD today per routine K 4.5 - was d/c on a 3 K bath - I think we can change to 2 K bath at discharge- likely eating differently at home. 3. Hypertension/volume  - BP somewhat elevated today and last treatment; titrate edw gently; CXR NAD 4. Anemia  - Hgb 8.2  continue ESA and Fe - Hgb no significant change from d/c; of note though she had 1/2 + FOBT last admission and was transfused.2 units 1/27 5. Metabolic bone disease -  iPTH [id number] no vit D yet 6. Nutrition - renal carb mod diet + vitamin 7. ^ LFTs - no recent prior LFTs to compare - repeat in am  Sheffield SliderMartha B Bergman, PA-C [facility] Beeper [phone] 09/12/2014, 3:16 PM   I have seen and examined this patient and agree with plan as outlined above as well as the additions made in red.  Will plan for dialysis today during daytime hours so she can have close observation.  Still unclear what incited the event and what the event actually was.  Cont to monitor and r/o for MI.   Chrystine Frogge A,MD 09/12/2014 3:27 PM

## 2014-09-12 NOTE — ED Notes (Signed)
CBG of 84

## 2014-09-12 NOTE — Consult Note (Addendum)
CARDIOLOGY CONSULT NOTE   Patient ID: Natalie Wolfe MRN: [id number], DOB/AGE: 1930/03/03   Admit date: 09/12/2014 Date of Consult: 09/12/2014   Primary Physician: [name], [name], MD Primary Cardiologist: New  Pt. Profile  81 year old upcoming female with past medical history of hypertension, diabetes, and recently diagnosed ESRD just started on HD came in after cardiac arrest 5 min into outpt dialysis  Problem List  Past Medical History  Diagnosis Date  . Urinary frequency   . Routine general medical examination at a health care facility   . Other atopic dermatitis and related conditions   . Rash and other nonspecific skin eruption   . Other anxiety states   . Insomnia, unspecified   . Chronic kidney disease, stage II (mild)   . Type II or unspecified type diabetes mellitus with renal manifestations, not stated as uncontrolled   . PVD (peripheral vascular disease)   . Unspecified vitamin D deficiency   . Anemia, unspecified   . Obesity, unspecified   . Nocturia   . Pain in joint, lower leg   . Other and unspecified hyperlipidemia   . Unspecified essential hypertension   . Cataract     Past Surgical History  Procedure Laterality Date  . Abdominal hysterectomy  1970  . Breast surgery  1979    nodule removed  . Eye surgery  2004    catarcts removed from right eye.  . Multiple tooth extractions  09/2013    Remonve remaining 3 teeth, no with dentures   . Av fistula placement Left 09/06/2014    Procedure: CREATION OF LEFT UPPER ARM ARTERIOVENOUS (AV) FISTULA ;  Surgeon: Pryor OchoaJames D Lawson, MD;  [facility];  Service: Vascular;  Laterality: Left;  . Insertion of dialysis catheter N/A 09/06/2014    Procedure: INSERTION OF DIALYSIS CATHETER RIGHT INTERNAL JUGULAR VEIN;  Surgeon: Pryor OchoaJames D Lawson, MD;  [facility];  Service: Vascular;  Laterality: N/A;  . Ligation of competing branches of arteriovenous fistula Left 09/06/2014    Procedure: LIGATION OF COMPETING BRANCHES  OF ARTERIOVENOUS FISTULA;  Surgeon: Pryor OchoaJames D Lawson, MD;  [facility];  Service: Vascular;  Laterality: Left;     Allergies  No Known Allergies  HPI   The patient is a 81 year old upcoming female with past medical history of hypertension, diabetes, and recently diagnosed ESRD just started on HD. She was just diagnosed ESRD in January and underwent left-sided AV fistula placement by vascular surgery on 09/06/2014. She had one dialysis as inpatient before her discharge.   Post discharge, patient has been feeling very well. She denies any recent CP, SOB, fever, chill or cough. Active. Able to go to store without any problems.Denies any h/o known heart disease. Echo 09/03/14 EF normal no regional WMA. Moderate to severe MR.   She started on her new medication of labetalol 100 mg twice a day. This morning, patient woke up feeling fine. She took public transportation to her dialysis center. 5 minutes into hemodialysis, she yelled to nursing staff that she had significant shortness breath before became unresponsive. CPR was initiated after failed to feel her pulse. It it unknown how long her CPR was performed. She eventually had spontaneous return of pulse. [facility].   Inpatient Medications  . insulin aspart  0-9 Units Subcutaneous TID WC  . sodium chloride  3 mL Intravenous Q12H    Family History Family History  Problem Relation Age of Onset  . Diabetes  Mother      Social History History   Social History  . Marital Status: Widowed    Spouse Name: N/A    Number of Children: N/A  . Years of Education: N/A   Occupational History  . Not on file.   Social History Main Topics  . Smoking status: Former Smoker    Types: Cigarettes    Quit date: 08/10/2000  . Smokeless tobacco: Not on file  . Alcohol Use: No  . Drug Use: No  . Sexual Activity: No   Other Topics Concern  . Not on file   Social History Narrative     Review  of Systems  General:  No chills, fever, night sweats or weight changes.  Cardiovascular:  No dyspnea on exertion, edema, orthopnea, palpitations, paroxysmal nocturnal dyspnea. +chest pain after CPR Dermatological: No rash, lesions/masses Respiratory: No cough +dyspnea Urologic: No hematuria, dysuria Abdominal:   No nausea, vomiting, diarrhea, bright red blood per rectum, melena, or hematemesis Neurologic:  No visual changes. +cardiac arrest. All other systems reviewed and are otherwise negative except as noted above.  Physical Exam  Blood pressure 160/66, pulse 89, temperature 97.7 F (36.5 C), temperature source Oral, resp. rate 24, SpO2 100 %.  General: Pleasant, NAD Psych: Normal affect. Neuro: Alert and oriented X 3. Moves all extremities spontaneously. HEENT: Normal  Neck: Supple without bruits or JVD. Lungs:  Resp regular and unlabored, CTA. Heart: RRR no s3, s4, soft MR murmur. L AVF, R sided catheter. Chest sore on palpation Abdomen: Soft, non-tender, non-distended, BS + x 4.  Extremities: No clubbing, cyanosis or edema. DP/PT/Radials 2+ and equal bilaterally.  Labs  No results for input(s): CKTOTAL, CKMB, TROPONINI in the last 72 hours. Lab Results  Component Value Date   WBC 14.8* 09/12/2014   HGB 8.2* 09/12/2014   HCT 26.6* 09/12/2014   MCV 88.4 09/12/2014   PLT 49* 09/12/2014    Recent Labs Lab 09/12/14 1253  NA 137  K 4.5  CL 101  CO2 24  BUN 20  CREATININE 8.08*  CALCIUM 8.2*  PROT 6.0  BILITOT 0.3  ALKPHOS 73  ALT 51*  AST 265*  GLUCOSE 112*   Lab Results  Component Value Date   HDL 52 08/29/2014   LDLCALC 112* 08/29/2014   TRIG 115 08/29/2014   No results found for: DDIMER  Radiology/Studies  Dg Chest 2 View  09/12/2014   CLINICAL DATA:  Syncope.  EXAM: CHEST  2 VIEW  COMPARISON:  September 06, 2014.  FINDINGS: The heart size and mediastinal contours are within normal limits. Both lungs are clear. No pneumothorax or pleural effusion is  noted. Right internal jugular dialysis catheter is again noted with distal tip in expected position of the SVC. The visualized skeletal structures are unremarkable.  IMPRESSION: No acute cardiopulmonary abnormality seen.   Electronically Signed   By: Roque Lias M.D.   On: 09/12/2014 14:08   Dg Chest 2 View  09/03/2014   CLINICAL DATA:  81 year old female with shortness breath for 2-3 days.  EXAM: CHEST  2 VIEW  COMPARISON:  Chest x-ray 09/01/2014.  FINDINGS: There is cephalization of the pulmonary vasculature and slight indistinctness of the interstitial markings suggestive of mild pulmonary edema. Small left pleural effusion. Opacity in the medial aspect of the left lung base may reflect atelectasis and/or consolidation. Heart size appears normal. Upper mediastinal contours are within normal limits. Atherosclerosis in the thoracic aorta.  IMPRESSION: 1. There appears to be mild interstitial pulmonary  edema. Given the normal heart size, clinical evaluation for cause of noncardiogenic pulmonary edema suggested. 2. Small left pleural effusion. 3. Developing opacity in the medial aspect of the left lung base may reflect underlying atelectasis, or airspace consolidation from infection or aspiration. 4. Atherosclerosis.   Electronically Signed   By: Trudie Reed M.D.   On: 09/03/2014 13:33   Dg Chest 2 View  08/31/2014   CLINICAL DATA:  Weakness for 2 days  EXAM: CHEST  2 VIEW  COMPARISON:  None.  FINDINGS: Cardiac shadow is within normal limits. The lungs are well aerated bilaterally. Very minimal atelectatic changes are noted in the posterior costophrenic angle on the lateral projection likely in the posterior right lower lobe.  IMPRESSION: Minimal right lower lobe atelectasis.   Electronically Signed   By: Alcide Clever M.D.   On: 08/31/2014 07:13   US Renal  08/31/2014   CLINICAL DATA:  Acute kidney injury. Chronic kidney disease stage 2. Diabetes and hypertension  EXAM: RENAL/URINARY TRACT ULTRASOUND  COMPLETE  COMPARISON:  None.  FINDINGS: Right Kidney:  Length: 7 cm. Increased parenchymal echogenicity. Inferior pole cyst measures 6 mm. No mass or hydronephrosis noted.  Left Kidney:  Length: 9.1 cm. Increased parenchymal echogenicity. Inferior pole cyst measures 3.1 x 3.1 x 3.0 cm. No mass or hydronephrosis.  Bladder:  Collapsed around a Foley catheter.  Other:  Bilateral pleural effusions noted.  IMPRESSION: 1. No obstructive uropathy. 2. Bilateral renal atrophy and increased parenchymal echogenicity compatible with chronic medical renal disease.   Electronically Signed   By: Signa Kell M.D.   On: 08/31/2014 12:57   Dg Chest Port 1 View  09/06/2014   CLINICAL DATA:  Status post diatek catheter placement.  EXAM: PORTABLE CHEST - 1 VIEW  COMPARISON:  09/04/2014  FINDINGS: The right IJ diatek catheter is in good position. The distal tip is at the cavoatrial junction and the proximal tip is in the distal SVC. No pneumothorax or hematoma. The lungs show improved aeration when compared to prior chest film. There is persistent perihilar pulmonary edema and vascular congestion. No definite pleural effusions.  IMPRESSION: Right IJ dialysis catheter in good position without complicating features.  Improved aeration since prior study.   Electronically Signed   By: Loralie Champagne M.D.   On: 09/06/2014 13:21   Dg Chest Port 1 View  09/04/2014   CLINICAL DATA:  81 year old female with shortness of Breath. Initial encounter.  EXAM: PORTABLE CHEST - 1 VIEW  COMPARISON:  09/03/2014 and earlier.  FINDINGS: Portable AP semi upright view at 0702 hrs. Increased basilar predominant interstitial opacity and obscuration of the diaphragm. Increased veiling opacity at both lung bases. Stable cardiac size and mediastinal contours. No pneumothorax. Visualized tracheal air column is within normal limits.  IMPRESSION: Progressed pulmonary edema, bilateral pleural effusions, and bibasilar atelectasis.   Electronically Signed   By:  Augusto Gamble M.D.   On: 09/04/2014 07:21   Dg Chest Port 1 View  09/01/2014   CLINICAL DATA:  Shortness of breath. Stage IV renal disease due to diabetes.  EXAM: PORTABLE CHEST - 1 VIEW  COMPARISON:  08/31/2014  FINDINGS: Normal heart size. Pulmonary vascularity is increasing since prior study with development of slight interstitial pattern in the lung bases. This suggest developing edema. Small left pleural effusion is also developing. Calcification of aorta. No pneumothorax. Mediastinal contours are intact.  IMPRESSION: Developing pulmonary vascular congestion edema with small left pleural effusion.   Electronically Signed   By: Chrissie Noa  Andria Meuse M.D.   On: 09/01/2014 22:27   Dg Fluoro Guide Cv Line-no Report  09/06/2014   CLINICAL DATA:    FLOURO GUIDE CV LINE  Fluoroscopy was utilized by the requesting physician.  No radiographic  interpretation.     ECG NSR without significant ST-T wave changes  ASSESSMENT AND PLAN  1. Possible cardiac arrest  - unclear what brought on the event  - if no significant rise in trop, will plan for [facility] tomorrow, otherwise if significant trop rise, will need cath  - may benefit from outpt event monitor on discharge if ischemic workup negative   2. ESRD  - recently started on HD, T/Th/Sat  3. Thrombocytopenia: normal platelet during last admission  4. Elevated LFT 5. HTN 6. DM II   Signed, Azalee Course, PA-C 09/12/2014, 5:15 PM   Patient seen and examined with Azalee Course, PA-C. We discussed all aspects of the encounter. I agree with the assessment and plan as stated above.  Unclear what actually happened this am. We do not have any rhythm strips or other information. I am doubtful that this is a primary ischemic event. May be related to labetalol or high vagal tone. ECG ok.  Recent echo ok. Agree with cycling markers. If troponin not significantly elevated will proceed with [facility] for further risk stratification. (will order in am if troponins not  markedly elevated.  Keep on tele. Check limited echo.  Truman Hayward 5:45 PM

## 2014-09-12 NOTE — ED Notes (Signed)
PA with nephrology at the bedside.

## 2014-09-12 NOTE — Progress Notes (Signed)
Admission note:  Arrival Method: Stretcher from ED Mental Orientation:A&O X4 Telemetry: Box [id number] SR Assessment: See doc flow sheet Skin: Dry; intact IV: R A/C Pain: Mild chest pain 4/10 Tubes: N/A Safety Measures: bed alarm activated. Yellow socks placed. Bed in lowest position, call light and bedside table with all belongings with in reach. Fall Prevention Safety Plan: Reviewed Admission Screening: To be completed 6700 Orientation: Patient has been oriented to the unit, staff and to the room.  Admitting MD paged once pt arrived to unit.

## 2014-09-12 NOTE — H&P (Signed)
[facility] History and Physical  Natalie Wolfe BMW:[id number] DOB: 07/14/1930 DOA: 09/12/2014  Referring physician: Dr. Gwendolyn Grant PCP: [name], MD   Chief Complaint: Reported cardiopulmonary arrest  HPI: Natalie Wolfe is a 81 y.o. female with past medical history of end-stage renal disease, hypertension and diabetes mellitus type 2 patient brought to the hospital from local dialysis center after she lost her consciousness, had low blood pressure and ACLS protocol for cardiopulmonary arrest started on her. The history obtained from EDP and consulting physician records as well as the patient. So patient was doing okay until this morning, she took only her second dose of labetalol this morning after she picked it up yesterday, she went to dialysis and only 5 minutes into her dialysis she started to yell that she is short of breath. According to records she lost her consciousness, they started CPR with return of normal circulation. Patient did not remember anything about this, the very first thing she remembered she said the nursing staff and get her from dialysis machine. In the ED patient was found to have platelets of 49, WBCs of 14.8, AST/ALT elevated. Patient is not hypotensive, and reportedly was not hypotensive during the dialysis. No evidence of dizziness prior to dialysis, complaining about midchest tenderness likely secondary to CPR.  Review of Systems:  Constitutional: negative for anorexia, fevers and sweats Eyes: negative for irritation, redness and visual disturbance Ears, nose, mouth, throat, and face: negative for earaches, epistaxis, nasal congestion and sore throat Respiratory: Complaining about shortness of breath every now and then. Cardiovascular: negative for chest pain, dyspnea, lower extremity edema, orthopnea, palpitations and syncope Gastrointestinal: negative for abdominal pain, constipation, diarrhea, melena, nausea and  vomiting Genitourinary:negative for dysuria, frequency and hematuria Hematologic/lymphatic: negative for bleeding, easy bruising and lymphadenopathy Musculoskeletal:negative for arthralgias, muscle weakness and stiff joints Neurological: negative for coordination problems, gait problems, headaches and weakness Endocrine: negative for diabetic symptoms including polydipsia, polyuria and weight loss Allergic/Immunologic: negative for anaphylaxis, hay fever and urticaria  Past Medical History  Diagnosis Date  . Urinary frequency   . Routine general medical examination at a health care facility   . Other atopic dermatitis and related conditions   . Rash and other nonspecific skin eruption   . Other anxiety states   . Insomnia, unspecified   . Chronic kidney disease, stage II (mild)   . Type II or unspecified type diabetes mellitus with renal manifestations, not stated as uncontrolled   . PVD (peripheral vascular disease)   . Unspecified vitamin D deficiency   . Anemia, unspecified   . Obesity, unspecified   . Nocturia   . Pain in joint, lower leg   . Other and unspecified hyperlipidemia   . Unspecified essential hypertension   . Cataract    Past Surgical History  Procedure Laterality Date  . Abdominal hysterectomy  1970  . Breast surgery  1979    nodule removed  . Eye surgery  2004    catarcts removed from right eye.  . Multiple tooth extractions  09/2013    Remonve remaining 3 teeth, no with dentures   . Av fistula placement Left 09/06/2014    Procedure: CREATION OF LEFT UPPER ARM ARTERIOVENOUS (AV) FISTULA ;  Surgeon: Pryor Ochoa, MD;  Location: [facility] OR;  Service: Vascular;  Laterality: Left;  . Insertion of dialysis catheter N/A 09/06/2014    Procedure: INSERTION OF DIALYSIS CATHETER RIGHT INTERNAL JUGULAR VEIN;  Surgeon: Pryor Ochoa, MD;  Location: [facility] OR;  Service:  Vascular;  Laterality: N/A;  . Ligation of competing branches of arteriovenous fistula Left 09/06/2014     Procedure: LIGATION OF COMPETING BRANCHES OF ARTERIOVENOUS FISTULA;  Surgeon: Pryor OchoaJames D Lawson, MD;  [facility];  Service: Vascular;  Laterality: Left;   Social History:   reports that she quit smoking about 14 years ago. Her smoking use included Cigarettes. She does not have any smokeless tobacco history on file. She reports that she does not drink alcohol or use illicit drugs.  No Known Allergies  Family History  Problem Relation Age of Onset  . Diabetes Mother     Prior to Admission medications   Medication Sig Start Date End Date Taking? Authorizing Provider  acetaminophen (TYLENOL) 325 MG tablet Take 650 mg by mouth daily as needed for mild pain.    Yes Historical Provider, MD  amLODipine (NORVASC) 5 MG tablet Take 5 mg by mouth daily. 08/16/14  Yes Historical Provider, MD  calcium acetate (PHOSLO) 667 MG capsule Take 1 capsule (667 mg total) by mouth 3 (three) times daily with meals. 09/09/14  Yes Belkys A Regalado, MD  fluocinonide cream (LIDEX) 0.05 % Apply 1 application topically 2 (two) times daily. Apply from neck down twice a day (provided by dermatology).   Yes Historical Provider, MD  glimepiride (AMARYL) 2 MG tablet Take 1 mg by mouth daily. 08/30/14  Yes Historical Provider, MD  glucose blood (BAYER CONTOUR NEXT TEST) test strip Use as instructed 11/27/12  Yes Cathey Miller, RPH-CPP  hydrocortisone 2.5 % lotion Apply 1 application topically 2 (two) times daily. Apply to skin and face 10/18/12  Yes Historical Provider, MD  labetalol (NORMODYNE) 100 MG tablet Take 1 tablet (100 mg total) by mouth 2 (two) times daily. 09/09/14  Yes Belkys A Regalado, MD  multivitamin (RENA-VIT) TABS tablet Take 1 tablet by mouth at bedtime. 09/09/14  Yes Belkys A Regalado, MD  VESICARE 5 MG tablet Take 5 mg by mouth daily. 08/20/14  Yes Historical Provider, MD  rosuvastatin (CRESTOR) 20 MG tablet Take 20 mg by mouth daily.    Historical Provider, MD   Physical Exam: Filed Vitals:   09/12/14 1505  BP:  160/66  Pulse:   Temp:   Resp: 24   Constitutional: Oriented to person, place, and time. Well-developed and well-nourished. Cooperative.  Head: Normocephalic and atraumatic.  Nose: Nose normal.  Mouth/Throat: Uvula is midline, oropharynx is clear and moist and mucous membranes are normal.  Eyes: Conjunctivae and EOM are normal. Pupils are equal, round, and reactive to light.  Neck: Trachea normal and normal range of motion. Neck supple.  Cardiovascular: Normal rate, regular rhythm, S1 normal, S2 normal, normal heart sounds and intact distal pulses.   Pulmonary/Chest: Effort normal and breath sounds normal.  mild to moderate tenderness around the sternum Abdominal: Soft. Bowel sounds are normal. There is no hepatosplenomegaly. There is no tenderness.  Musculoskeletal: Normal range of motion.  trace pedal edema  Neurological: Alert and oriented to person, place, and time. Has normal strength. No cranial nerve deficit or sensory deficit.  Skin: Skin is warm, dry and intact.  Psychiatric: Has a normal mood and affect. Speech is normal and behavior is normal.   Labs on Admission:  Basic Metabolic Panel:  Recent Labs Lab 09/06/14 0440 09/07/14 1200 09/12/14 1253  NA 141 139 137  K 3.9 3.4* 4.5  CL 103 103 101  CO2 27 26 24   GLUCOSE 104* 139* 112*  BUN 42* 35* 20  CREATININE 6.14* 6.21*  8.08*  CALCIUM 8.7 8.0* 8.2*  PHOS  --  4.6  --    Liver Function Tests:  Recent Labs Lab 09/07/14 1200 09/12/14 1253  AST  --  265*  ALT  --  51*  ALKPHOS  --  73  BILITOT  --  0.3  PROT  --  6.0  ALBUMIN 2.6* 2.9*   No results for input(s): LIPASE, AMYLASE in the last 168 hours. No results for input(s): AMMONIA in the last 168 hours. CBC:  Recent Labs Lab 09/06/14 0440 09/07/14 1200 09/12/14 1253  WBC 10.2 10.2 14.8*  HGB 8.8* 8.5* 8.2*  HCT 28.2* 26.7* 26.6*  MCV 84.2 85.0 88.4  PLT 264 222 49*   Cardiac Enzymes: No results for input(s): CKTOTAL, CKMB, CKMBINDEX, TROPONINI  in the last 168 hours.  BNP (last 3 results) No results for input(s): BNP in the last 8760 hours.  ProBNP (last 3 results) No results for input(s): PROBNP in the last 8760 hours.  CBG:  Recent Labs Lab 09/08/14 2032 09/09/14 0001 09/09/14 0718 09/09/14 1144 09/12/14 1410  GLUCAP 108* 103* 92 92 84    Radiological Exams on Admission: Dg Chest 2 View  09/12/2014   CLINICAL DATA:  Syncope.  EXAM: CHEST  2 VIEW  COMPARISON:  September 06, 2014.  FINDINGS: The heart size and mediastinal contours are within normal limits. Both lungs are clear. No pneumothorax or pleural effusion is noted. Right internal jugular dialysis catheter is again noted with distal tip in expected position of the SVC. The visualized skeletal structures are unremarkable.  IMPRESSION: No acute cardiopulmonary abnormality seen.   Electronically Signed   By: Roque Lias M.D.   On: 09/12/2014 14:08    EKG: Independently reviewed. HR is 86, PR interval is 118  Assessment/Plan Principal Problem:   Cardiopulmonary arrest Active Problems:   DM type 2, uncontrolled, with renal complications   Syncope   ESRD (end stage renal disease)   HTN (hypertension)   Hypotension   Thrombocytopenia    Cardiopulmonary arrest Reportedly only 5 minutes after started on dialysis, no evidence of hypotension, currently not hypotensive. Patient took her labetalol in the morning, she only took last night and this morning doses. Had loss of consciousness, they couldn't detect pulse. She had CPR for unknown period of time to me. Likely to have PE, patient is not tachycardic, not hypoxic. Cardiology to evaluate, had recent echo done about a week ago showed no wall motion abnormalities. EF > 60%  ESRD Already seen by nephrology, patient did not complete her dialysis session earlier today. Dialysis orders placed in by nephrology to complete her dialysis today.  Thrombocytopenia Unclear etiology, no evidence of bleeding, platelets  were normal on 1/30/616. Check DIC and HIT panels, no heparin products for DVT prophylaxis or dialysis.  Elevated LFTs AST/L ALT are increased, unclear etiology. Thought initially to be secondary to hypotension. As mentioned above no evidence of hypotension and the dialysis center.  Diabetes mellitus type 2 Hemoglobin A1c was 4.7 on 08/29/14 seems to be very tight glycemic control. Start on sensitive SSI and start carb modified diet.  Code Status: Full code Family Communication: Plan discussed with the patient Disposition Plan: Telemetry  Time spent: 70 minutes  [facility] Pager [phone]

## 2014-09-12 NOTE — ED Notes (Signed)
Report attempted x 1

## 2014-09-12 NOTE — ED Notes (Signed)
Family at bedside. 

## 2014-09-12 NOTE — Procedures (Signed)
Patient was seen on dialysis and the procedure was supervised.  BFR 150  Via PC BP is  160/90.   Patient appears to be tolerating treatment well so far- had an episode of " going out " at the OP center today- required resuscitation- using low BFR . Minimal UF  Natalie Wolfe A 09/12/2014

## 2014-09-13 ENCOUNTER — Observation Stay (HOSPITAL_COMMUNITY): Payer: Medicare Other

## 2014-09-13 DIAGNOSIS — I420 Dilated cardiomyopathy: Secondary | ICD-10-CM | POA: Diagnosis present

## 2014-09-13 DIAGNOSIS — E785 Hyperlipidemia, unspecified: Secondary | ICD-10-CM | POA: Diagnosis present

## 2014-09-13 DIAGNOSIS — I959 Hypotension, unspecified: Secondary | ICD-10-CM | POA: Diagnosis present

## 2014-09-13 DIAGNOSIS — I469 Cardiac arrest, cause unspecified: Secondary | ICD-10-CM

## 2014-09-13 DIAGNOSIS — R7989 Other specified abnormal findings of blood chemistry: Secondary | ICD-10-CM | POA: Diagnosis present

## 2014-09-13 DIAGNOSIS — I472 Ventricular tachycardia: Secondary | ICD-10-CM | POA: Diagnosis present

## 2014-09-13 DIAGNOSIS — Z833 Family history of diabetes mellitus: Secondary | ICD-10-CM | POA: Diagnosis not present

## 2014-09-13 DIAGNOSIS — I739 Peripheral vascular disease, unspecified: Secondary | ICD-10-CM | POA: Diagnosis present

## 2014-09-13 DIAGNOSIS — I491 Atrial premature depolarization: Secondary | ICD-10-CM | POA: Diagnosis present

## 2014-09-13 DIAGNOSIS — I2510 Atherosclerotic heart disease of native coronary artery without angina pectoris: Secondary | ICD-10-CM | POA: Diagnosis present

## 2014-09-13 DIAGNOSIS — R778 Other specified abnormalities of plasma proteins: Secondary | ICD-10-CM | POA: Diagnosis present

## 2014-09-13 DIAGNOSIS — D638 Anemia in other chronic diseases classified elsewhere: Secondary | ICD-10-CM | POA: Diagnosis not present

## 2014-09-13 DIAGNOSIS — E11649 Type 2 diabetes mellitus with hypoglycemia without coma: Secondary | ICD-10-CM | POA: Diagnosis present

## 2014-09-13 DIAGNOSIS — Z992 Dependence on renal dialysis: Secondary | ICD-10-CM | POA: Diagnosis not present

## 2014-09-13 DIAGNOSIS — E1129 Type 2 diabetes mellitus with other diabetic kidney complication: Secondary | ICD-10-CM | POA: Diagnosis not present

## 2014-09-13 DIAGNOSIS — Z87891 Personal history of nicotine dependence: Secondary | ICD-10-CM | POA: Diagnosis not present

## 2014-09-13 DIAGNOSIS — N2581 Secondary hyperparathyroidism of renal origin: Secondary | ICD-10-CM | POA: Diagnosis present

## 2014-09-13 DIAGNOSIS — D631 Anemia in chronic kidney disease: Secondary | ICD-10-CM | POA: Diagnosis present

## 2014-09-13 DIAGNOSIS — R55 Syncope and collapse: Secondary | ICD-10-CM | POA: Diagnosis present

## 2014-09-13 DIAGNOSIS — E1165 Type 2 diabetes mellitus with hyperglycemia: Secondary | ICD-10-CM | POA: Diagnosis present

## 2014-09-13 DIAGNOSIS — E1122 Type 2 diabetes mellitus with diabetic chronic kidney disease: Secondary | ICD-10-CM | POA: Diagnosis present

## 2014-09-13 DIAGNOSIS — N186 End stage renal disease: Secondary | ICD-10-CM | POA: Diagnosis not present

## 2014-09-13 DIAGNOSIS — I5040 Unspecified combined systolic (congestive) and diastolic (congestive) heart failure: Secondary | ICD-10-CM | POA: Diagnosis present

## 2014-09-13 DIAGNOSIS — Z90710 Acquired absence of both cervix and uterus: Secondary | ICD-10-CM | POA: Diagnosis not present

## 2014-09-13 DIAGNOSIS — D696 Thrombocytopenia, unspecified: Secondary | ICD-10-CM | POA: Diagnosis present

## 2014-09-13 DIAGNOSIS — I120 Hypertensive chronic kidney disease with stage 5 chronic kidney disease or end stage renal disease: Secondary | ICD-10-CM | POA: Diagnosis present

## 2014-09-13 LAB — CBC
HCT: 24.6 % — ABNORMAL LOW (ref 36.0–46.0)
Hemoglobin: 7.7 g/dL — ABNORMAL LOW (ref 12.0–15.0)
MCH: 26.9 pg (ref 26.0–34.0)
MCHC: 31.3 g/dL (ref 30.0–36.0)
MCV: 86 fL (ref 78.0–100.0)
Platelets: 71 10*3/uL — ABNORMAL LOW (ref 150–400)
RBC: 2.86 MIL/uL — ABNORMAL LOW (ref 3.87–5.11)
RDW: 18.8 % — ABNORMAL HIGH (ref 11.5–15.5)
WBC: 7.9 10*3/uL (ref 4.0–10.5)

## 2014-09-13 LAB — TROPONIN I: TROPONIN I: 0.06 ng/mL — AB (ref <0.031)

## 2014-09-13 LAB — GLUCOSE, CAPILLARY
GLUCOSE-CAPILLARY: 67 mg/dL — AB (ref 70–99)
GLUCOSE-CAPILLARY: 93 mg/dL (ref 70–99)
Glucose-Capillary: 85 mg/dL (ref 70–99)
Glucose-Capillary: 152 mg/dL — ABNORMAL HIGH (ref 70–99)
Glucose-Capillary: 70 mg/dL (ref 70–99)
Glucose-Capillary: 105 mg/dL — ABNORMAL HIGH (ref 70–99)
Glucose-Capillary: 92 mg/dL (ref 70–99)
Glucose-Capillary: 83 mg/dL (ref 70–99)

## 2014-09-13 LAB — COMPREHENSIVE METABOLIC PANEL
ALT: 31 U/L (ref 0–35)
AST: 129 U/L — ABNORMAL HIGH (ref 0–37)
Albumin: 2.6 g/dL — ABNORMAL LOW (ref 3.5–5.2)
Alkaline Phosphatase: 67 U/L (ref 39–117)
Anion gap: 8 (ref 5–15)
BUN: 8 mg/dL (ref 6–23)
CO2: 30 mmol/L (ref 19–32)
Calcium: 7.8 mg/dL — ABNORMAL LOW (ref 8.4–10.5)
Chloride: 102 mmol/L (ref 96–112)
Creatinine, Ser: 3.93 mg/dL — ABNORMAL HIGH (ref 0.50–1.10)
GFR calc Af Amer: 11 mL/min — ABNORMAL LOW (ref >90)
GFR calc non Af Amer: 10 mL/min — ABNORMAL LOW (ref >90)
Glucose, Bld: 70 mg/dL (ref 70–99)
Potassium: 3.6 mmol/L (ref 3.5–5.1)
Sodium: 140 mmol/L (ref 135–145)
Total Bilirubin: 0.2 mg/dL — ABNORMAL LOW (ref 0.3–1.2)
Total Protein: 5.9 g/dL — ABNORMAL LOW (ref 6.0–8.3)

## 2014-09-13 MED ORDER — DARBEPOETIN ALFA 100 MCG/0.5ML IJ SOSY: 100.0000 ug | PREFILLED_SYRINGE | INTRAMUSCULAR | Status: DC

## 2014-09-13 MED ORDER — TECHNETIUM TO 99M ALBUMIN AGGREGATED: 6.0000 | Freq: Once | INTRAVENOUS | Status: AC | PRN

## 2014-09-13 MED ORDER — DARBEPOETIN ALFA 60 MCG/0.3ML IJ SOSY
60.0000 ug | PREFILLED_SYRINGE | INTRAMUSCULAR | Status: AC
  Administered 2014-09-14: 60 ug via INTRAVENOUS
  Filled 2014-09-13: qty 0.3

## 2014-09-13 MED ORDER — SODIUM CHLORIDE 0.9 % IV SOLN
125.0000 mg | INTRAVENOUS | Status: DC
  Administered 2014-09-14 – 2014-09-17 (×2): 125 mg via INTRAVENOUS
  Filled 2014-09-13 (×3): qty 10

## 2014-09-13 MED ORDER — TECHNETIUM TC 99M DIETHYLENETRIAME-PENTAACETIC ACID: 40.0000 | Freq: Once | INTRAVENOUS | Status: AC | PRN

## 2014-09-13 NOTE — Progress Notes (Signed)
SUBJECTIVE:  Feels well.  Chest soreness after CPR.  Worse with inspiration.  She does not understand what happened yesterday as she was feeling very well all day before her event at dialysis.   OBJECTIVE:   Vitals:   Filed Vitals:   09/12/14 2130 09/12/14 2145 09/12/14 2215 09/13/14 0451  BP: 164/68 152/71 152/55 139/45  Pulse: 69 72 76 74  Temp: 98.2 F (36.8 C)  97.8 F (36.6 C) 98.7 F (37.1 C)  TempSrc: Oral  Oral Oral  Resp: Weight: 126 lb 1.7 oz (57.2 kg)  122 lb (55.339 kg)   SpO2: 96%  98% 97%   I&O's:   Intake/Output Summary (Last 24 hours) at 09/13/14 0859 Last data filed at 09/13/14 0300  Gross per 24 hour  Intake    220 ml  Output   1808 ml  Net  -1588 ml   TELEMETRY: Reviewed telemetry pt in NSR:     PHYSICAL EXAM General: Well developed, well nourished, in no acute distress Head:   Normal cephalic and atramatic  Lungs:  No wheezing. Heart:   HRRR S1 S2  No JVD.   Abdomen: abdomen soft and non-tender Msk:  Back normal,  Normal strength and tone for age. Extremities:   No edema.   Neuro: Alert and oriented. Psych:  Normal affect, responds appropriately Skin: No rash   LABS: Basic Metabolic Panel:  Recent Labs  16/10/96 1253 09/13/14 0519  NA 137 140  K 4.5 3.6  CL 101 102  CO2 24 30  GLUCOSE 112* 70  BUN 20 8  CREATININE 8.08* 3.93*  CALCIUM 8.2* 7.8*   Liver Function Tests:  Recent Labs  09/12/14 1253 09/13/14 0519  AST 265* 129*  ALT 51* 31  ALKPHOS 73 67  BILITOT 0.3 0.2*  PROT 6.0 5.9*  ALBUMIN 2.9* 2.6*   No results for input(s): LIPASE, AMYLASE in the last 72 hours. CBC:  Recent Labs  09/12/14 1253 09/12/14 1730 09/13/14 0519  WBC 14.8*  --  7.9  HGB 8.2*  --  7.7*  HCT 26.6*  --  24.6*  MCV 88.4  --  86.0  PLT 49* 74* 71*   Cardiac Enzymes: No results for input(s): CKTOTAL, CKMB, CKMBINDEX, TROPONINI in the last 72 hours. BNP: Invalid input(s): POCBNP D-Dimer:  Recent Labs  09/12/14 1730   DDIMER 9.57*   Hemoglobin A1C: No results for input(s): HGBA1C in the last 72 hours. Fasting Lipid Panel: No results for input(s): CHOL, HDL, LDLCALC, TRIG, CHOLHDL, LDLDIRECT in the last 72 hours. Thyroid Function Tests:  Recent Labs  09/12/14 1730  TSH 3.464   Anemia Panel: No results for input(s): VITAMINB12, FOLATE, FERRITIN, TIBC, IRON, RETICCTPCT in the last 72 hours. Coag Panel:   Lab Results  Component Value Date   INR 1.15 09/12/2014   INR 1.06 09/04/2014   INR 1.01 09/01/2014    RADIOLOGY: Dg Chest 2 View  09/12/2014   CLINICAL DATA:  Syncope.  EXAM: CHEST  2 VIEW  COMPARISON:  September 06, 2014.  FINDINGS: The heart size and mediastinal contours are within normal limits. Both lungs are clear. No pneumothorax or pleural effusion is noted. Right internal jugular dialysis catheter is again noted with distal tip in expected position of the SVC. The visualized skeletal structures are unremarkable.  IMPRESSION: No acute cardiopulmonary abnormality seen.   Electronically Signed   By: Roque Lias M.D.   On: 09/12/2014 14:08   Dg Chest 2  View  09/03/2014   CLINICAL DATA:  81 year old female with shortness breath for 2-3 days.  EXAM: CHEST  2 VIEW  COMPARISON:  Chest x-ray 09/01/2014.  FINDINGS: There is cephalization of the pulmonary vasculature and slight indistinctness of the interstitial markings suggestive of mild pulmonary edema. Small left pleural effusion. Opacity in the medial aspect of the left lung base may reflect atelectasis and/or consolidation. Heart size appears normal. Upper mediastinal contours are within normal limits. Atherosclerosis in the thoracic aorta.  IMPRESSION: 1. There appears to be mild interstitial pulmonary edema. Given the normal heart size, clinical evaluation for cause of noncardiogenic pulmonary edema suggested. 2. Small left pleural effusion. 3. Developing opacity in the medial aspect of the left lung base may reflect underlying atelectasis, or  airspace consolidation from infection or aspiration. 4. Atherosclerosis.   Electronically Signed   By: Trudie Reedaniel  Entrikin M.D.   On: 09/03/2014 13:33   Dg Chest 2 View  08/31/2014   CLINICAL DATA:  Weakness for 2 days  EXAM: CHEST  2 VIEW  COMPARISON:  None.  FINDINGS: Cardiac shadow is within normal limits. The lungs are well aerated bilaterally. Very minimal atelectatic changes are noted in the posterior costophrenic angle on the lateral projection likely in the posterior right lower lobe.  IMPRESSION: Minimal right lower lobe atelectasis.   Electronically Signed   By: Alcide CleverMark  Lukens M.D.   On: 08/31/2014 07:13   [place] Renal  08/31/2014   CLINICAL DATA:  Acute kidney injury. Chronic kidney disease stage 2. Diabetes and hypertension  EXAM: RENAL/URINARY TRACT ULTRASOUND COMPLETE  COMPARISON:  None.  FINDINGS: Right Kidney:  Length: 7 cm. Increased parenchymal echogenicity. Inferior pole cyst measures 6 mm. No mass or hydronephrosis noted.  Left Kidney:  Length: 9.1 cm. Increased parenchymal echogenicity. Inferior pole cyst measures 3.1 x 3.1 x 3.0 cm. No mass or hydronephrosis.  Bladder:  Collapsed around a Foley catheter.  Other:  Bilateral pleural effusions noted.  IMPRESSION: 1. No obstructive uropathy. 2. Bilateral renal atrophy and increased parenchymal echogenicity compatible with chronic medical renal disease.   Electronically Signed   By: Signa Kellaylor  Stroud M.D.   On: 08/31/2014 12:57   Dg Chest Port 1 View  09/06/2014   CLINICAL DATA:  Status post diatek catheter placement.  EXAM: PORTABLE CHEST - 1 VIEW  COMPARISON:  09/04/2014  FINDINGS: The right IJ diatek catheter is in good position. The distal tip is at the cavoatrial junction and the proximal tip is in the distal SVC. No pneumothorax or hematoma. The lungs show improved aeration when compared to prior chest film. There is persistent perihilar pulmonary edema and vascular congestion. No definite pleural effusions.  IMPRESSION: Right IJ dialysis  catheter in good position without complicating features.  Improved aeration since prior study.   Electronically Signed   By: Loralie ChampagneMark  Gallerani M.D.   On: 09/06/2014 13:21   Dg Chest Port 1 View  09/04/2014   CLINICAL DATA:  81 year old female with shortness of Breath. Initial encounter.  EXAM: PORTABLE CHEST - 1 VIEW  COMPARISON:  09/03/2014 and earlier.  FINDINGS: Portable AP semi upright view at 0702 hrs. Increased basilar predominant interstitial opacity and obscuration of the diaphragm. Increased veiling opacity at both lung bases. Stable cardiac size and mediastinal contours. No pneumothorax. Visualized tracheal air column is within normal limits.  IMPRESSION: Progressed pulmonary edema, bilateral pleural effusions, and bibasilar atelectasis.   Electronically Signed   By: Augusto GambleLee  Hall M.D.   On: 09/04/2014 07:21   Dg  Chest Port 1 View  09/01/2014   CLINICAL DATA:  Shortness of breath. Stage IV renal disease due to diabetes.  EXAM: PORTABLE CHEST - 1 VIEW  COMPARISON:  08/31/2014  FINDINGS: Normal heart size. Pulmonary vascularity is increasing since prior study with development of slight interstitial pattern in the lung bases. This suggest developing edema. Small left pleural effusion is also developing. Calcification of aorta. No pneumothorax. Mediastinal contours are intact.  IMPRESSION: Developing pulmonary vascular congestion edema with small left pleural effusion.   Electronically Signed   By: Burman Nieves M.D.   On: 09/01/2014 22:27   Dg Fluoro Guide Cv Line-no Report  09/06/2014   CLINICAL DATA:    FLOURO GUIDE CV LINE  Fluoroscopy was utilized by the requesting physician.  No radiographic  interpretation.       ASSESSMENT: Natalie Wolfe:  1) Cardiac arrest: Awaiting troponin result to decide on stress test.  No ischemic changes on ECG.  Patient is quite anemic which will make false positive stress test more likely.  Would consider transfusion with next dialysis treatment as well.  Await echo to see  if LV function changed from Jan.    If no stress today, will let her eat.  May need transfusion tomorrow and then stress after that, if troponin is low level.  If echo with wall motion abnormality, may need to consider cath.     Corky Crafts, MD  09/13/2014  8:59 AM

## 2014-09-13 NOTE — Progress Notes (Signed)
    Subjective:  No angina, her chest is "sore" sounds like she had brief CPR.   Objective:  Vital Signs in the last 24 hours: Temp:  [97.7 F (36.5 C)-98.7 F (37.1 C)] 98.6 F (37 C) (02/05 0912) Pulse Rate:  [69-89] 71 (02/05 0912) Resp:  [17-25] 20 (02/05 0912) BP: (139-168)/(45-80) 143/59 mmHg (02/05 0912) SpO2:  [96 %-100 %] 100 % (02/05 0912) Weight:  [122 lb (55.339 kg)-131 lb 2.8 oz (59.5 kg)] 122 lb (55.339 kg) (02/04 2215)  Intake/Output from previous day:  Intake/Output Summary (Last 24 hours) at 09/13/14 1213 Last data filed at 09/13/14 0300  Gross per 24 hour  Intake    220 ml  Output   1808 ml  Net  -1588 ml    Physical Exam: General appearance: alert, cooperative, cachectic and no distress Lungs: decreased at bases Heart: regular rate and rhythm   Rate: 76  Rhythm: normal sinus rhythm and premature atrial contractions (PAC)  Lab Results:  Recent Labs  09/12/14 1253 09/12/14 1730 09/13/14 0519  WBC 14.8*  --  7.9  HGB 8.2*  --  7.7*  PLT 49* 74* 71*    Recent Labs  09/12/14 1253 09/13/14 0519  NA 137 140  K 4.5 3.6  CL 101 102  CO2 24 30  GLUCOSE 112* 70  BUN 20 8  CREATININE 8.08* 3.93*    Recent Labs  09/13/14 1047  TROPONINI 0.06*    Recent Labs  09/12/14 1730  INR 1.15    Imaging: Imaging results have been reviewed  Cardiac Studies:  Assessment/Plan:  81 y/o female with DM and ESRD. Admitted from dialysis center after "cardiac arrest", no strips.  Mildly elevated Troponin. No documented arrhythmia.    Principal Problem:   Cardiopulmonary arrest Active Problems:   DM type 2, uncontrolled, with renal complications   ESRD (end stage renal disease)   Hypotension   Troponin level elevated-0.2   Anemia of chronic disease   Thrombocytopenia   PLAN: She had a VQ this am, she won't be able to have a Myoview till Sunday. Echo done, results pending.   Corine ShelterLuke Kilroy PA-C Beeper [phone] 09/13/2014, 12:13 PM   I have  examined the patient and reviewed assessment and plan and discussed with patient.  Agree with above as stated.  Minimally elevated troponin.  Doubt ischemic etiology of this event.  Only chest pain is musculoskeletal from CPR.  Would not pursue stress testing as the likelhood of false positive is high given her anemia, partcularly if LVEF is normal.   Jordane Hisle S.

## 2014-09-13 NOTE — Progress Notes (Signed)
UR completed 

## 2014-09-13 NOTE — Progress Notes (Signed)
Subjective:   Feels great, doesn't remember what happened out her outpt center.   Objective Filed Vitals:   09/12/14 2130 09/12/14 2145 09/12/14 2215 09/13/14 0451  BP: 164/68 152/71 152/55 139/45  Pulse: 69 72 76 74  Temp: 98.2 F (36.8 C)  97.8 F (36.6 C) 98.7 F (37.1 C)  TempSrc: Oral  Oral Oral  Resp: Weight: 57.2 kg (126 lb 1.7 oz)  55.339 kg (122 lb)   SpO2: 96%  98% 97%   Physical Exam General: alert and oriented. No acute distress.  Heart: RRR Lungs: CTA, unlabored.  Abdomen: soft, nontender. +BS Extremities: no edema Dialysis Access: R IJ/ L AVF +b/t  Dialysis Orders: [facility] -TTS 3.5 hr 180 new start from [facility] 400/800 had first tmt there 2/2 3.5 hr EDW 59 - left at 58.7 3 K 2.25 Ca left AVF, right IJ hearpin 300 Aranesp 40 start 2/4 venofer 100 x 8  Assessment/Plan: 1. Cardiorespiratory arrest - 5 min into HD 2/4, EKG no acute change, initial troponin 0.03. ECHO pending. ddimer elevated 2. ESRD - TTS - HD yesterday K 3.6 today - was d/c on a 3 K bath - may be able to change to 2 K bath at discharge- likely eating differently at home. 3. Hypertension/volume - 139/45; under edw- titrate gently; CXR NAD 4. Anemia - Hgb 7.7 continue ESA (give additional dose tomorrow) and Fe- she had 1/2 + FOBT last admission and was transfused.2 units 1/27- check stool cards again. Transfuse PRN 5. Metabolic bone disease - iPTH 170 no vit D yet 6. Nutrition - NPO for procedure. Alb 2.6 7. ^ LFTs - no recent prior LFTs to compare - improved this AM  Jetty Duhamel, NP [facility] Beeper [phone] 09/13/2014,9:04 AM  LOS: 1 day    Renal Attending: Uncertain if event of true cardiac origin.  Plan for HD in AM.  SHe appears quite stable Lynann Demetrius C    Additional Objective Labs: Basic Metabolic Panel:  Recent Labs Lab 09/07/14 1200 09/12/14 1253 09/13/14 0519  NA 139 137 140  K 3.4* 4.5 3.6  CL 103 101 102  CO2 GLUCOSE 139*  112* 70  BUN 35* 20 8  CREATININE 6.21* 8.08* 3.93*  CALCIUM 8.0* 8.2* 7.8*  PHOS 4.6  --   --    Liver Function Tests:  Recent Labs Lab 09/07/14 1200 09/12/14 1253 09/13/14 0519  AST  --  265* 129*  ALT  --  51* 31  ALKPHOS  --  73 67  BILITOT  --  0.3 0.2*  PROT  --  6.0 5.9*  ALBUMIN 2.6* 2.9* 2.6*   No results for input(s): LIPASE, AMYLASE in the last 168 hours. CBC:  Recent Labs Lab 09/07/14 1200 09/12/14 1253 09/12/14 1730 09/13/14 0519  WBC 10.2 14.8*  --  7.9  HGB 8.5* 8.2*  --  7.7*  HCT 26.7* 26.6*  --  24.6*  MCV 85.0 88.4  --  86.0  PLT 222 49* 74* 71*   Blood Culture    Component Value Date/Time   SDES URINE, RANDOM 09/01/2014 1230   SPECREQUEST NONE 09/01/2014 1230   REPTSTATUS 09/03/2014 FINAL 09/01/2014 1230    Cardiac Enzymes: No results for input(s): CKTOTAL, CKMB, CKMBINDEX, TROPONINI in the last 168 hours. CBG:  Recent Labs Lab 09/12/14 2209 09/12/14 2304 09/13/14 0259 09/13/14 0532 09/13/14 0743  GLUCAP 67* 85 152* 70 105*   Iron Studies: No results for  input(s): IRON, TIBC, TRANSFERRIN, FERRITIN in the last 72 hours. @lablastinr3 @ Studies/Results: Dg Chest 2 View  09/12/2014   CLINICAL DATA:  Syncope.  EXAM: CHEST  2 VIEW  COMPARISON:  September 06, 2014.  FINDINGS: The heart size and mediastinal contours are within normal limits. Both lungs are clear. No pneumothorax or pleural effusion is noted. Right internal jugular dialysis catheter is again noted with distal tip in expected position of the SVC. The visualized skeletal structures are unremarkable.  IMPRESSION: No acute cardiopulmonary abnormality seen.   Electronically Signed   By: Roque LiasJames  Green M.D.   On: 09/12/2014 14:08   Medications:   . insulin aspart  0-9 Units Subcutaneous TID WC  . sodium chloride  3 mL Intravenous Q12H

## 2014-09-13 NOTE — Progress Notes (Addendum)
[facility] PROGRESS NOTE  Natalie Wolfe ZOX:[id number] DOB: Jan 24, 1930 DOA: 09/12/2014 PCP: [name], MD  Assessment/Plan: Principal Problem:   Cardiopulmonary arrest Active Problems:   DM type 2, uncontrolled, with renal complications   Anemia of chronic disease   ESRD (end stage renal disease)   Hypotension   Thrombocytopenia   Troponin level elevated-0.2    Cardiopulmonary arrest Reportedly only 5 minutes after started on dialysis, no evidence of hypotension, currently not hypotensive. Cardiology doubtful that this is a primary ischemic event Had loss of consciousness, they couldn't detect pulse. VQ scan negative for PE, venous Doppler pending Troponin mildly elevated. Cardiology cardiology consultant, 2-D echo results pending, patient also needs a Myoview on Sunday   ESRD Already seen by nephrology, patient did not complete her dialysis session prior to admission Hemodialysis Tuesday Thursday Saturday Hemodialysis 2/4   Thrombocytopenia, improving Unclear etiology, no evidence of bleeding, platelets were normal on 09/07/14. Follow HIT panel, pending, no heparin products for DVT prophylaxis or dialysis.   Elevated LFTs, shock liver? AST/L ALT are increased, unclear etiology. Thought initially to be secondary to hypotension. Improving  Anemia of chronic disease Hemoglobin trending down 8.8-7.7 Will need packed red blood cell transfusion with next hemodialysis 1. she had 1/2 + FOBT last admission and was transfused.2 units 1/27- check stool cards again. Transfuse PRN  Diabetes mellitus type 2 Hemoglobin A1c was 4.7 on 08/29/14 seems to be very tight glycemic control. Start on sensitive SSI and start carb modified diet.   Code Status: full Family Communication: family updated about patient's clinical progress Disposition Plan:  As above    Brief narrative: 81 year old upcoming female with past medical history of hypertension, diabetes, and  recently diagnosed ESRD just started on HD. She was just diagnosed ESRD in January and underwent left-sided AV fistula placement by vascular surgery on 09/06/2014. She had one dialysis as inpatient before her discharge.   Post discharge, patient has been feeling very well. She denies any recent CP, SOB, fever, chill or cough. Active. Able to go to store without any problems.Denies any h/o known heart disease. Echo 09/03/14 EF normal no regional WMA. Moderate to severe MR.   She started on her new medication of labetalol 100 mg twice a day. This morning, patient woke up feeling fine. She took public transportation to her dialysis center. 5 minutes into hemodialysis, she yelled to nursing staff that she had significant shortness breath before became unresponsive. CPR was initiated after failed to feel her pulse. It it unknown how long her CPR was performed. She eventually had spontaneous return of pulse. She was transported to [facility] for further evaluation.   Consultants:  Cardiology  Nephrology   Procedures None  Antibiotics: Anti-infectives:@  HPI/Subjective: Patient awake alert oriented and comfortable, in no acute distress no shortness of breath but has pleuritic chest pain from her CPR   Objective: Filed Vitals:   09/12/14 2145 09/12/14 2215 09/13/14 0451 09/13/14 0912  BP: 152/71 152/55 139/45 143/59  Pulse: 72 76 74 71  Temp:  97.8 F (36.6 C) 98.7 F (37.1 C) 98.6 F (37 C)  TempSrc:  Oral Oral Oral  Resp: 25 20 20 20   Weight:  55.339 kg (122 lb)    SpO2:  98% 97% 100%    Intake/Output Summary (Last 24 hours) at 09/13/14 1307 Last data filed at 09/13/14 0300  Gross per 24 hour  Intake    220 ml  Output   1808 ml  Net  -1588  ml    Exam:  General: No acute respiratory distress Lungs: Clear to auscultation bilaterally without wheezes or crackles Cardiovascular: Regular rate and rhythm without murmur gallop or rub normal S1 and S2 Abdomen: Nontender,  nondistended, soft, bowel sounds positive, no rebound, no ascites, no appreciable mass Extremities: No significant cyanosis, clubbing, or edema bilateral lower extremities      Data Reviewed: Basic Metabolic Panel:  Recent Labs Lab 09/07/14 1200 09/12/14 1253 09/13/14 0519  NA 139 137 140  K 3.4* 4.5 3.6  CL 103 101 102  CO2 GLUCOSE 139* 112* 70  BUN 35* 20 8  CREATININE 6.21* 8.08* 3.93*  CALCIUM 8.0* 8.2* 7.8*  PHOS 4.6  --   --     Liver Function Tests:  Recent Labs Lab 09/07/14 1200 09/12/14 1253 09/13/14 0519  AST  --  265* 129*  ALT  --  51* 31  ALKPHOS  --  73 67  BILITOT  --  0.3 0.2*  PROT  --  6.0 5.9*  ALBUMIN 2.6* 2.9* 2.6*   No results for input(s): LIPASE, AMYLASE in the last 168 hours. No results for input(s): AMMONIA in the last 168 hours.  CBC:  Recent Labs Lab 09/07/14 1200 09/12/14 1253 09/12/14 1730 09/13/14 0519  WBC 10.2 14.8*  --  7.9  HGB 8.5* 8.2*  --  7.7*  HCT 26.7* 26.6*  --  24.6*  MCV 85.0 88.4  --  86.0  PLT 222 49* 74* 71*    Cardiac Enzymes:  Recent Labs Lab 09/13/14 1047  TROPONINI 0.06*   BNP (last 3 results) No results for input(s): BNP in the last 8760 hours.  ProBNP (last 3 results) No results for input(s): PROBNP in the last 8760 hours.    CBG:  Recent Labs Lab 09/12/14 2304 09/13/14 0259 09/13/14 0532 09/13/14 0743 09/13/14 1152  GLUCAP 85 152* 70 105* 92    Recent Results (from the past 240 hour(s))  Surgical pcr screen     Status: None   Collection Time: 09/03/14  8:27 PM  Result Value Ref Range Status   MRSA, PCR NEGATIVE NEGATIVE Final   Staphylococcus aureus NEGATIVE NEGATIVE Final    Comment:        The Xpert SA Assay (FDA approved for NASAL specimens in patients over 9 years of age), is one component of a comprehensive surveillance program.  Test performance has been validated by [facility] for patients greater than or equal to 17 year old. It is not  intended to diagnose infection nor to guide or monitor treatment.      Studies: Dg Chest 2 View  09/12/2014   CLINICAL DATA:  Syncope.  EXAM: CHEST  2 VIEW  COMPARISON:  September 06, 2014.  FINDINGS: The heart size and mediastinal contours are within normal limits. Both lungs are clear. No pneumothorax or pleural effusion is noted. Right internal jugular dialysis catheter is again noted with distal tip in expected position of the SVC. The visualized skeletal structures are unremarkable.  IMPRESSION: No acute cardiopulmonary abnormality seen.   Electronically Signed   By: Roque Lias M.D.   On: 09/12/2014 14:08   Dg Chest 2 View  09/03/2014   CLINICAL DATA:  81 year old female with shortness breath for 2-3 days.  EXAM: CHEST  2 VIEW  COMPARISON:  Chest x-ray 09/01/2014.  FINDINGS: There is cephalization of the pulmonary vasculature and slight indistinctness of the interstitial markings suggestive of mild pulmonary edema. Small left  pleural effusion. Opacity in the medial aspect of the left lung base may reflect atelectasis and/or consolidation. Heart size appears normal. Upper mediastinal contours are within normal limits. Atherosclerosis in the thoracic aorta.  IMPRESSION: 1. There appears to be mild interstitial pulmonary edema. Given the normal heart size, clinical evaluation for cause of noncardiogenic pulmonary edema suggested. 2. Small left pleural effusion. 3. Developing opacity in the medial aspect of the left lung base may reflect underlying atelectasis, or airspace consolidation from infection or aspiration. 4. Atherosclerosis.   Electronically Signed   By: Trudie Reed M.D.   On: 09/03/2014 13:33   Dg Chest 2 View  08/31/2014   CLINICAL DATA:  Weakness for 2 days  EXAM: CHEST  2 VIEW  COMPARISON:  None.  FINDINGS: Cardiac shadow is within normal limits. The lungs are well aerated bilaterally. Very minimal atelectatic changes are noted in the posterior costophrenic angle on the lateral  projection likely in the posterior right lower lobe.  IMPRESSION: Minimal right lower lobe atelectasis.   Electronically Signed   By: Alcide Clever M.D.   On: 08/31/2014 07:13   US Renal  08/31/2014   CLINICAL DATA:  Acute kidney injury. Chronic kidney disease stage 2. Diabetes and hypertension  EXAM: RENAL/URINARY TRACT ULTRASOUND COMPLETE  COMPARISON:  None.  FINDINGS: Right Kidney:  Length: 7 cm. Increased parenchymal echogenicity. Inferior pole cyst measures 6 mm. No mass or hydronephrosis noted.  Left Kidney:  Length: 9.1 cm. Increased parenchymal echogenicity. Inferior pole cyst measures 3.1 x 3.1 x 3.0 cm. No mass or hydronephrosis.  Bladder:  Collapsed around a Foley catheter.  Other:  Bilateral pleural effusions noted.  IMPRESSION: 1. No obstructive uropathy. 2. Bilateral renal atrophy and increased parenchymal echogenicity compatible with chronic medical renal disease.   Electronically Signed   By: Signa Kell M.D.   On: 08/31/2014 12:57   Nm Pulmonary Perf And Vent  09/13/2014   CLINICAL DATA:  Shortness of breath. Syncopal episode during dialysis rib wiring chest compressions. Chest pain.  EXAM: NUCLEAR MEDICINE VENTILATION - PERFUSION LUNG SCAN  TECHNIQUE: Ventilation images were obtained in multiple projections using inhaled aerosol technetium 99 M DTPA. Perfusion images were obtained in multiple projections after intravenous injection of Tc-68m MAA.  RADIOPHARMACEUTICALS:  40.0 mCi Tc-58m DTPA aerosol and 6.0 mCi Tc-80m MAA  COMPARISON:  Two-view chest x-ray 09/12/2014.  FINDINGS: Ventilation: There is some clumping of the radiotracer about the left hilum. This is likely artifactual. No defects are evident.  Perfusion: No wedge shaped peripheral perfusion defects to suggest acute pulmonary embolism.  IMPRESSION: No evidence for pulmonary embolism.   Electronically Signed   By: Gennette Pac M.D.   On: 09/13/2014 10:38   Dg Chest Port 1 View  09/06/2014   CLINICAL DATA:  Status post diatek  catheter placement.  EXAM: PORTABLE CHEST - 1 VIEW  COMPARISON:  09/04/2014  FINDINGS: The right IJ diatek catheter is in good position. The distal tip is at the cavoatrial junction and the proximal tip is in the distal SVC. No pneumothorax or hematoma. The lungs show improved aeration when compared to prior chest film. There is persistent perihilar pulmonary edema and vascular congestion. No definite pleural effusions.  IMPRESSION: Right IJ dialysis catheter in good position without complicating features.  Improved aeration since prior study.   Electronically Signed   By: Loralie Champagne M.D.   On: 09/06/2014 13:21   Dg Chest Port 1 View  09/04/2014   CLINICAL DATA:  81 year old female  with shortness of Breath. Initial encounter.  EXAM: PORTABLE CHEST - 1 VIEW  COMPARISON:  09/03/2014 and earlier.  FINDINGS: Portable AP semi upright view at 0702 hrs. Increased basilar predominant interstitial opacity and obscuration of the diaphragm. Increased veiling opacity at both lung bases. Stable cardiac size and mediastinal contours. No pneumothorax. Visualized tracheal air column is within normal limits.  IMPRESSION: Progressed pulmonary edema, bilateral pleural effusions, and bibasilar atelectasis.   Electronically Signed   By: Augusto GambleLee  Hall M.D.   On: 09/04/2014 07:21   Dg Chest Port 1 View  09/01/2014   CLINICAL DATA:  Shortness of breath. Stage IV renal disease due to diabetes.  EXAM: PORTABLE CHEST - 1 VIEW  COMPARISON:  08/31/2014  FINDINGS: Normal heart size. Pulmonary vascularity is increasing since prior study with development of slight interstitial pattern in the lung bases. This suggest developing edema. Small left pleural effusion is also developing. Calcification of aorta. No pneumothorax. Mediastinal contours are intact.  IMPRESSION: Developing pulmonary vascular congestion edema with small left pleural effusion.   Electronically Signed   By: Burman NievesWilliam  Stevens M.D.   On: 09/01/2014 22:27   Dg Fluoro Guide  Cv Line-no Report  09/06/2014   CLINICAL DATA:    FLOURO GUIDE CV LINE  Fluoroscopy was utilized by the requesting physician.  No radiographic  interpretation.     Scheduled Meds: . [START ON 09/21/2014] darbepoetin (ARANESP) injection - DIALYSIS  100 mcg Intravenous Q Sat-HD  . [START ON 09/14/2014] darbepoetin (ARANESP) injection - DIALYSIS  60 mcg Intravenous Q Sat-HD  . [START ON 09/14/2014] ferric gluconate (FERRLECIT/NULECIT) IV  125 mg Intravenous Q T,Th,Sa-HD  . insulin aspart  0-9 Units Subcutaneous TID WC  . sodium chloride  3 mL Intravenous Q12H   Continuous Infusions:   Principal Problem:   Cardiopulmonary arrest Active Problems:   DM type 2, uncontrolled, with renal complications   Anemia of chronic disease   ESRD (end stage renal disease)   Hypotension   Thrombocytopenia   Troponin level elevated-0.2    Time spent: 40 minutes   [facility]ucks CountyBROL,Lillar Bianca  [facility] Pager [phone]. If 7PM-7AM, please contact night-coverage at [email or web address], password [email or web address] 09/13/2014, 1:07 PM  LOS: 1 day

## 2014-09-13 NOTE — Progress Notes (Signed)
  Echocardiogram 2D Echocardiogram has been performed.  Natalie Wolfe 09/13/2014, 11:23 AM

## 2014-09-14 DIAGNOSIS — R55 Syncope and collapse: Secondary | ICD-10-CM

## 2014-09-14 DIAGNOSIS — D638 Anemia in other chronic diseases classified elsewhere: Secondary | ICD-10-CM

## 2014-09-14 LAB — CBC
HCT: 22.7 % — ABNORMAL LOW (ref 36.0–46.0)
Hemoglobin: 7.2 g/dL — ABNORMAL LOW (ref 12.0–15.0)
MCH: 27.6 pg (ref 26.0–34.0)
MCHC: 31.7 g/dL (ref 30.0–36.0)
MCV: 87 fL (ref 78.0–100.0)
Platelets: 77 10*3/uL — ABNORMAL LOW (ref 150–400)
RBC: 2.61 MIL/uL — AB (ref 3.87–5.11)
RDW: 18.9 % — ABNORMAL HIGH (ref 11.5–15.5)
WBC: 8.4 10*3/uL (ref 4.0–10.5)

## 2014-09-14 LAB — COMPREHENSIVE METABOLIC PANEL
ALT: 20 U/L (ref 0–35)
AST: 64 U/L — ABNORMAL HIGH (ref 0–37)
Albumin: 2.5 g/dL — ABNORMAL LOW (ref 3.5–5.2)
Alkaline Phosphatase: 59 U/L (ref 39–117)
Anion gap: 8 (ref 5–15)
BUN: 11 mg/dL (ref 6–23)
CO2: 28 mmol/L (ref 19–32)
CREATININE: 6.84 mg/dL — AB (ref 0.50–1.10)
Calcium: 8.1 mg/dL — ABNORMAL LOW (ref 8.4–10.5)
Chloride: 101 mmol/L (ref 96–112)
GFR calc Af Amer: 6 mL/min — ABNORMAL LOW (ref >90)
GFR calc non Af Amer: 5 mL/min — ABNORMAL LOW (ref >90)
Glucose, Bld: 95 mg/dL (ref 70–99)
POTASSIUM: 4.2 mmol/L (ref 3.5–5.1)
SODIUM: 137 mmol/L (ref 135–145)
Total Bilirubin: 0.4 mg/dL (ref 0.3–1.2)
Total Protein: 5.8 g/dL — ABNORMAL LOW (ref 6.0–8.3)

## 2014-09-14 LAB — GLUCOSE, CAPILLARY
GLUCOSE-CAPILLARY: 75 mg/dL (ref 70–99)
GLUCOSE-CAPILLARY: 51 mg/dL — AB (ref 70–99)
Glucose-Capillary: 151 mg/dL — ABNORMAL HIGH (ref 70–99)
Glucose-Capillary: 39 mg/dL — CL (ref 70–99)
Glucose-Capillary: 104 mg/dL — ABNORMAL HIGH (ref 70–99)
Glucose-Capillary: 122 mg/dL — ABNORMAL HIGH (ref 70–99)

## 2014-09-14 LAB — PREPARE RBC (CROSSMATCH)

## 2014-09-14 MED ORDER — LIDOCAINE-PRILOCAINE 2.5-2.5 % EX CREA: 1.0000 "application " | TOPICAL_CREAM | CUTANEOUS | Status: DC | PRN

## 2014-09-14 MED ORDER — ALTEPLASE 2 MG IJ SOLR
2.0000 mg | Freq: Once | INTRAMUSCULAR | Status: DC | PRN
  Filled 2014-09-14: qty 2

## 2014-09-14 MED ORDER — PENTAFLUOROPROP-TETRAFLUOROETH EX AERO: 1.0000 "application " | INHALATION_SPRAY | CUTANEOUS | Status: DC | PRN

## 2014-09-14 MED ORDER — SODIUM CHLORIDE 0.9 % IV SOLN: 100.0000 mL | INTRAVENOUS | Status: DC | PRN

## 2014-09-14 MED ORDER — SODIUM CHLORIDE 0.9 % IV SOLN
Freq: Once | INTRAVENOUS | Status: AC
  Administered 2014-09-14: 10 mL/h via INTRAVENOUS

## 2014-09-14 MED ORDER — LIDOCAINE HCL (PF) 1 % IJ SOLN: 5.0000 mL | INTRAMUSCULAR | Status: DC | PRN

## 2014-09-14 MED ORDER — HEPARIN SODIUM (PORCINE) 1000 UNIT/ML DIALYSIS: 1000.0000 [IU] | INTRAMUSCULAR | Status: DC | PRN

## 2014-09-14 MED ORDER — NEPRO/CARBSTEADY PO LIQD: 237.0000 mL | ORAL | Status: DC | PRN

## 2014-09-14 MED ORDER — DARBEPOETIN ALFA 60 MCG/0.3ML IJ SOSY
PREFILLED_SYRINGE | INTRAMUSCULAR | Status: AC
  Administered 2014-09-14: 60 ug via INTRAVENOUS
  Filled 2014-09-14: qty 0.3

## 2014-09-14 NOTE — Progress Notes (Signed)
Patient received 1 unit of blood with no complications.

## 2014-09-14 NOTE — Progress Notes (Signed)
SUBJECTIVE:  Denies CP  OBJECTIVE:   Vitals:   Filed Vitals:   09/14/14 1110 09/14/14 1215 09/14/14 1225 09/14/14 1305  BP: 147/60  142/62 134/61  Pulse: 73 80 78 78  Temp: 98.7 F (37.1 C) 98.4 F (36.9 C) 98.5 F (36.9 C) 98.3 F (36.8 C)  TempSrc: Oral Oral Oral Oral  Resp: Weight:      SpO2: 100% 100% 100% 100%   I&O's:   Intake/Output Summary (Last 24 hours) at 09/14/14 1420 Last data filed at 09/14/14 1250  Gross per 24 hour  Intake    850 ml  Output   1000 ml  Net   -150 ml   TELEMETRY: Reviewed telemetry pt in NSR:     PHYSICAL EXAM General: Well developed, well nourished, in no acute distress Head: Eyes PERRLA, No xanthomas.   Normal cephalic and atramatic  Lungs:   Clear bilaterally to auscultation and percussion. Heart:   HRRR S1 S2 Pulses are 2+ & equal. Abdomen: Bowel sounds are positive, abdomen soft and non-tender without masses Extremities:   No clubbing, cyanosis or edema.  DP +1 Neuro: Alert and oriented X 3. Psych:  Good affect, responds appropriately   LABS: Basic Metabolic Panel:  Recent Labs  16/10/96 0519 09/14/14 0500  NA 140 137  K 3.6 4.2  CL 102 101  CO2 30 28  GLUCOSE 70 95  BUN 8 11  CREATININE 3.93* 6.84*  CALCIUM 7.8* 8.1*   Liver Function Tests:  Recent Labs  09/13/14 0519 09/14/14 0500  AST 129* 64*  ALT 31 20  ALKPHOS 67 59  BILITOT 0.2* 0.4  PROT 5.9* 5.8*  ALBUMIN 2.6* 2.5*   No results for input(s): LIPASE, AMYLASE in the last 72 hours. CBC:  Recent Labs  09/13/14 0519 09/14/14 0500  WBC 7.9 8.4  HGB 7.7* 7.2*  HCT 24.6* 22.7*  MCV 86.0 87.0  PLT 71* 77*   Cardiac Enzymes:  Recent Labs  09/13/14 1047  TROPONINI 0.06*   BNP: Invalid input(s): POCBNP D-Dimer:  Recent Labs  09/12/14 1730  DDIMER 9.57*   Hemoglobin A1C: No results for input(s): HGBA1C in the last 72 hours. Fasting Lipid Panel: No results for input(s): CHOL, HDL, LDLCALC, TRIG, CHOLHDL, LDLDIRECT in  the last 72 hours. Thyroid Function Tests:  Recent Labs  09/12/14 1730  TSH 3.464   Anemia Panel: No results for input(s): VITAMINB12, FOLATE, FERRITIN, TIBC, IRON, RETICCTPCT in the last 72 hours. Coag Panel:   Lab Results  Component Value Date   INR 1.15 09/12/2014   INR 1.06 09/04/2014   INR 1.01 09/01/2014    RADIOLOGY: Dg Chest 2 View  09/12/2014   CLINICAL DATA:  Syncope.  EXAM: CHEST  2 VIEW  COMPARISON:  September 06, 2014.  FINDINGS: The heart size and mediastinal contours are within normal limits. Both lungs are clear. No pneumothorax or pleural effusion is noted. Right internal jugular dialysis catheter is again noted with distal tip in expected position of the SVC. The visualized skeletal structures are unremarkable.  IMPRESSION: No acute cardiopulmonary abnormality seen.   Electronically Signed   By: Roque Lias M.D.   On: 09/12/2014 14:08   Dg Chest 2 View  09/03/2014   CLINICAL DATA:  81 year old female with shortness breath for 2-3 days.  EXAM: CHEST  2 VIEW  COMPARISON:  Chest x-ray 09/01/2014.  FINDINGS: There is cephalization of the pulmonary vasculature and slight indistinctness of the interstitial markings suggestive  of mild pulmonary edema. Small left pleural effusion. Opacity in the medial aspect of the left lung base may reflect atelectasis and/or consolidation. Heart size appears normal. Upper mediastinal contours are within normal limits. Atherosclerosis in the thoracic aorta.  IMPRESSION: 1. There appears to be mild interstitial pulmonary edema. Given the normal heart size, clinical evaluation for cause of noncardiogenic pulmonary edema suggested. 2. Small left pleural effusion. 3. Developing opacity in the medial aspect of the left lung base may reflect underlying atelectasis, or airspace consolidation from infection or aspiration. 4. Atherosclerosis.   Electronically Signed   By: Trudie Reedaniel  Entrikin M.D.   On: 09/03/2014 13:33   Dg Chest 2 View  08/31/2014   CLINICAL  DATA:  Weakness for 2 days  EXAM: CHEST  2 VIEW  COMPARISON:  None.  FINDINGS: Cardiac shadow is within normal limits. The lungs are well aerated bilaterally. Very minimal atelectatic changes are noted in the posterior costophrenic angle on the lateral projection likely in the posterior right lower lobe.  IMPRESSION: Minimal right lower lobe atelectasis.   Electronically Signed   By: Alcide CleverMark  Lukens M.D.   On: 08/31/2014 07:13   [place] Renal  08/31/2014   CLINICAL DATA:  Acute kidney injury. Chronic kidney disease stage 2. Diabetes and hypertension  EXAM: RENAL/URINARY TRACT ULTRASOUND COMPLETE  COMPARISON:  None.  FINDINGS: Right Kidney:  Length: 7 cm. Increased parenchymal echogenicity. Inferior pole cyst measures 6 mm. No mass or hydronephrosis noted.  Left Kidney:  Length: 9.1 cm. Increased parenchymal echogenicity. Inferior pole cyst measures 3.1 x 3.1 x 3.0 cm. No mass or hydronephrosis.  Bladder:  Collapsed around a Foley catheter.  Other:  Bilateral pleural effusions noted.  IMPRESSION: 1. No obstructive uropathy. 2. Bilateral renal atrophy and increased parenchymal echogenicity compatible with chronic medical renal disease.   Electronically Signed   By: Signa Kellaylor  Stroud M.D.   On: 08/31/2014 12:57   Nm Pulmonary Perf And Vent  09/13/2014   CLINICAL DATA:  Shortness of breath. Syncopal episode during dialysis rib wiring chest compressions. Chest pain.  EXAM: NUCLEAR MEDICINE VENTILATION - PERFUSION LUNG SCAN  TECHNIQUE: Ventilation images were obtained in multiple projections using inhaled aerosol technetium 99 M DTPA. Perfusion images were obtained in multiple projections after intravenous injection of Tc-122m MAA.  RADIOPHARMACEUTICALS:  40.0 mCi Tc-6022m DTPA aerosol and 6.0 mCi Tc-3622m MAA  COMPARISON:  Two-view chest x-ray 09/12/2014.  FINDINGS: Ventilation: There is some clumping of the radiotracer about the left hilum. This is likely artifactual. No defects are evident.  Perfusion: No wedge shaped peripheral  perfusion defects to suggest acute pulmonary embolism.  IMPRESSION: No evidence for pulmonary embolism.   Electronically Signed   By: Gennette Pachris  Mattern M.D.   On: 09/13/2014 10:38   Dg Chest Port 1 View  09/06/2014   CLINICAL DATA:  Status post diatek catheter placement.  EXAM: PORTABLE CHEST - 1 VIEW  COMPARISON:  09/04/2014  FINDINGS: The right IJ diatek catheter is in good position. The distal tip is at the cavoatrial junction and the proximal tip is in the distal SVC. No pneumothorax or hematoma. The lungs show improved aeration when compared to prior chest film. There is persistent perihilar pulmonary edema and vascular congestion. No definite pleural effusions.  IMPRESSION: Right IJ dialysis catheter in good position without complicating features.  Improved aeration since prior study.   Electronically Signed   By: Loralie ChampagneMark  Gallerani M.D.   On: 09/06/2014 13:21   Dg Chest Port 1 View  09/04/2014  CLINICAL DATA:  81 year old female with shortness of Breath. Initial encounter.  EXAM: PORTABLE CHEST - 1 VIEW  COMPARISON:  09/03/2014 and earlier.  FINDINGS: Portable AP semi upright view at 0702 hrs. Increased basilar predominant interstitial opacity and obscuration of the diaphragm. Increased veiling opacity at both lung bases. Stable cardiac size and mediastinal contours. No pneumothorax. Visualized tracheal air column is within normal limits.  IMPRESSION: Progressed pulmonary edema, bilateral pleural effusions, and bibasilar atelectasis.   Electronically Signed   By: Augusto Gamble M.D.   On: 09/04/2014 07:21   Dg Chest Port 1 View  09/01/2014   CLINICAL DATA:  Shortness of breath. Stage IV renal disease due to diabetes.  EXAM: PORTABLE CHEST - 1 VIEW  COMPARISON:  08/31/2014  FINDINGS: Normal heart size. Pulmonary vascularity is increasing since prior study with development of slight interstitial pattern in the lung bases. This suggest developing edema. Small left pleural effusion is also developing.  Calcification of aorta. No pneumothorax. Mediastinal contours are intact.  IMPRESSION: Developing pulmonary vascular congestion edema with small left pleural effusion.   Electronically Signed   By: Burman Nieves M.D.   On: 09/01/2014 22:27   Dg Fluoro Guide Cv Line-no Report  09/06/2014   CLINICAL DATA:    FLOURO GUIDE CV LINE  Fluoroscopy was utilized by the requesting physician.  No radiographic  interpretation.    Assessment/Plan:  81 y/o female with DM and ESRD. Admitted from dialysis center after "cardiac arrest", no strips. Mildly elevated Troponin. No documented arrhythmia.    Principal Problem:  Cardiopulmonary arrest - 2D echo showed normal LVF 08/2014 and now EF 35% with diffuse hypokinesis.  Troponin slightly increased at 0.06.  VQ scan negative for PE.   Active Problems:  DM type 2, uncontrolled, with renal complications  ESRD (end stage renal disease)  Hypotension  Troponin level elevated-0.2  Anemia of chronic disease  Thrombocytopenia   PLAN: Given worsening LVF and cardiac arrest she needs left heart cath on Monday.  She needs to be tuned up over the weekend. Would transfuse for Hg 7.2.  Will hold on BB for now.  Apparently had just been started on Labetalol and felt well prior to HD but once home from HD had acute onset of SOB and then cardiac arrest.  ? Primary arrhythmia.  EP will see in the am.    Quintella Reichert, MD  09/14/2014  2:20 PM

## 2014-09-14 NOTE — Progress Notes (Signed)
Hypoglycemic Event  CBG: 39 Treatment: 15 GM carbohydrate snack  Symptoms: None  Follow-up CBG: Time:1540  CBG Result: 51  Possible Reasons for Event: Inadequate meal intake  Comments/MD notified: Dr. Calvert CantorSaima Rizwan  Patient CBG at 1825 is 104.    Natalie Wolfe, Natalie Wolfe  Remember to initiate Hypoglycemia Order Set & complete

## 2014-09-14 NOTE — Progress Notes (Signed)
[facility] PROGRESS NOTE  Natalie ReaderGeorgianna Wolfe AVW:[id number] DOB: 1929-09-15 DOA: 09/12/2014 PCP: [name], [name], MD   Brief narrative: 81 year old upcoming female with past medical history of hypertension, diabetes, and recently diagnosed ESRD just started on HD. She was just diagnosed ESRD in January and underwent left-sided AV fistula placement by vascular surgery on 09/06/2014. She had one dialysis as inpatient before her discharge.   Post discharge, patient has been feeling very well. She denies any recent CP, SOB, fever, chill or cough. Active. Able to go to store without any problems.Denies any h/o known heart disease. Echo 09/03/14 EF normal no regional WMA. Moderate to severe MR.   She started on her new medication of labetalol 100 mg twice a day. This morning, patient woke up feeling fine. She took public transportation to her dialysis center. 5 minutes into hemodialysis, she yelled to nursing staff that she had significant shortness breath before becoming unresponsive. CPR was initiated after they failed to find her pulse. It it unknown how long her CPR was performed. She eventually had spontaneous return of pulse. [facility].    HPI/Subjective: Patient evaluated on dialysis- she has no complaints- explained she will get blood today   Assessment/Plan: Cardiopulmonary arrest Reportedly only 5 minutes after started on dialysis without evidence of hypotension Cardiology initially doubtful that this is a primary ischemic event but now has a new ECHO finding of EF of 35% therefore, plans are for cath on Monday VQ scan negative for PE, venous Doppler pending Troponin mildly elevated.  Anemia due to ESRD - Hb 7.2- have brought this up to renal team - they have ordered a blood transfusion today- 1 U - f/u on FOBT  ESRD Already seen by nephrology, patient did not complete her dialysis session prior to admission Hemodialysis  Tuesday Thursday Saturday  Thrombocytopenia, improving Unclear etiology, no evidence of bleeding, platelets were normal on 09/07/14. Follow HIT panel pending, no heparin products for DVT prophylaxis or dialysis.   Elevated LFTs, shock liver? AST/L ALT are increased, unclear etiology. Thought initially to be secondary to hypotension. Improving  Diabetes mellitus type 2 Hemoglobin A1c was 4.7 on 08/29/14 indicating tight glycemic control. Started on sensitive SSI and carb modified diet. Amaryl on hold   Code Status: full Family Communication: Disposition Plan:  Cath on Monday per cardiology   Consultants:  Cardiology  Nephrology   Antibiotics: Anti-infectives    None      Objective: Filed Vitals:   09/14/14 1110 09/14/14 1215 09/14/14 1225 09/14/14 1305  BP: 147/60  142/62 134/61  Pulse: 73 80 78 78  Temp: 98.7 F (37.1 C) 98.4 F (36.9 C) 98.5 F (36.9 C) 98.3 F (36.8 C)  TempSrc: Oral Oral Oral Oral  Resp: 18 18 18 18   Weight:      SpO2: 100% 100% 100% 100%    Intake/Output Summary (Last 24 hours) at 09/14/14 1448 Last data filed at 09/14/14 1250  Gross per 24 hour  Intake    850 ml  Output   1000 ml  Net   -150 ml    Exam:  General: AAO x3, No acute respiratory distress Lungs: Clear to auscultation bilaterally without wheezes or crackles Cardiovascular: Regular rate and rhythm without murmur gallop or rub normal S1 and S2 Abdomen: Nontender, nondistended, soft, bowel sounds positive, no rebound, no ascites, no appreciable mass Extremities: No significant cyanosis, clubbing, or edema bilateral lower extremities  Data Reviewed: Basic Metabolic Panel:  Recent Labs Lab 09/12/14 1253 09/13/14 0519 09/14/14 0500  NA 137 140 137  K 4.5 3.6 4.2  CL 101 102 101  CO2 24 30 28   GLUCOSE 112* 70 95  BUN 20 8 11   CREATININE 8.08* 3.93* 6.84*  CALCIUM 8.2* 7.8* 8.1*    Liver Function Tests:  Recent Labs Lab 09/12/14 1253 09/13/14 0519  09/14/14 0500  AST 265* 129* 64*  ALT 51* 31 20  ALKPHOS 73 67 59  BILITOT 0.3 0.2* 0.4  PROT 6.0 5.9* 5.8*  ALBUMIN 2.9* 2.6* 2.5*   No results for input(s): LIPASE, AMYLASE in the last 168 hours. No results for input(s): AMMONIA in the last 168 hours.  CBC:  Recent Labs Lab 09/12/14 1253 09/12/14 1730 09/13/14 0519 09/14/14 0500  WBC 14.8*  --  7.9 8.4  HGB 8.2*  --  7.7* 7.2*  HCT 26.6*  --  24.6* 22.7*  MCV 88.4  --  86.0 87.0  PLT 49* 74* 71* 77*    Cardiac Enzymes:  Recent Labs Lab 09/13/14 1047  TROPONINI 0.06*   BNP (last 3 results) No results for input(s): BNP in the last 8760 hours.  ProBNP (last 3 results) No results for input(s): PROBNP in the last 8760 hours.    CBG:  Recent Labs Lab 09/13/14 1152 09/13/14 1656 09/13/14 2220 09/14/14 1114 09/14/14 1229  GLUCAP 92 93 83 75 151*    No results found for this or any previous visit (from the past 240 hour(s)).   Studies: Dg Chest 2 View  09/12/2014   CLINICAL DATA:  Syncope.  EXAM: CHEST  2 VIEW  COMPARISON:  September 06, 2014.  FINDINGS: The heart size and mediastinal contours are within normal limits. Both lungs are clear. No pneumothorax or pleural effusion is noted. Right internal jugular dialysis catheter is again noted with distal tip in expected position of the SVC. The visualized skeletal structures are unremarkable.  IMPRESSION: No acute cardiopulmonary abnormality seen.   Electronically Signed   By: Roque Lias M.D.   On: 09/12/2014 14:08   Dg Chest 2 View  09/03/2014   CLINICAL DATA:  81 year old female with shortness breath for 2-3 days.  EXAM: CHEST  2 VIEW  COMPARISON:  Chest x-ray 09/01/2014.  FINDINGS: There is cephalization of the pulmonary vasculature and slight indistinctness of the interstitial markings suggestive of mild pulmonary edema. Small left pleural effusion. Opacity in the medial aspect of the left lung base may reflect atelectasis and/or consolidation. Heart size  appears normal. Upper mediastinal contours are within normal limits. Atherosclerosis in the thoracic aorta.  IMPRESSION: 1. There appears to be mild interstitial pulmonary edema. Given the normal heart size, clinical evaluation for cause of noncardiogenic pulmonary edema suggested. 2. Small left pleural effusion. 3. Developing opacity in the medial aspect of the left lung base may reflect underlying atelectasis, or airspace consolidation from infection or aspiration. 4. Atherosclerosis.   Electronically Signed   By: Trudie Reed M.D.   On: 09/03/2014 13:33   Dg Chest 2 View  08/31/2014   CLINICAL DATA:  Weakness for 2 days  EXAM: CHEST  2 VIEW  COMPARISON:  None.  FINDINGS: Cardiac shadow is within normal limits. The lungs are well aerated bilaterally. Very minimal atelectatic changes are noted in the posterior costophrenic angle on the lateral projection likely in the posterior right lower lobe.  IMPRESSION: Minimal right lower lobe atelectasis.   Electronically Signed   By: Alcide Clever M.D.   On: 08/31/2014  07:13   US Renal  08/31/2014   CLINICAL DATA:  Acute kidney injury. Chronic kidney disease stage 2. Diabetes and hypertension  EXAM: RENAL/URINARY TRACT ULTRASOUND COMPLETE  COMPARISON:  None.  FINDINGS: Right Kidney:  Length: 7 cm. Increased parenchymal echogenicity. Inferior pole cyst measures 6 mm. No mass or hydronephrosis noted.  Left Kidney:  Length: 9.1 cm. Increased parenchymal echogenicity. Inferior pole cyst measures 3.1 x 3.1 x 3.0 cm. No mass or hydronephrosis.  Bladder:  Collapsed around a Foley catheter.  Other:  Bilateral pleural effusions noted.  IMPRESSION: 1. No obstructive uropathy. 2. Bilateral renal atrophy and increased parenchymal echogenicity compatible with chronic medical renal disease.   Electronically Signed   By: Signa Kell M.D.   On: 08/31/2014 12:57   Nm Pulmonary Perf And Vent  09/13/2014   CLINICAL DATA:  Shortness of breath. Syncopal episode during dialysis rib  wiring chest compressions. Chest pain.  EXAM: NUCLEAR MEDICINE VENTILATION - PERFUSION LUNG SCAN  TECHNIQUE: Ventilation images were obtained in multiple projections using inhaled aerosol technetium 99 M DTPA. Perfusion images were obtained in multiple projections after intravenous injection of Tc-56m MAA.  RADIOPHARMACEUTICALS:  40.0 mCi Tc-66m DTPA aerosol and 6.0 mCi Tc-38m MAA  COMPARISON:  Two-view chest x-ray 09/12/2014.  FINDINGS: Ventilation: There is some clumping of the radiotracer about the left hilum. This is likely artifactual. No defects are evident.  Perfusion: No wedge shaped peripheral perfusion defects to suggest acute pulmonary embolism.  IMPRESSION: No evidence for pulmonary embolism.   Electronically Signed   By: Gennette Pac M.D.   On: 09/13/2014 10:38   Dg Chest Port 1 View  09/06/2014   CLINICAL DATA:  Status post diatek catheter placement.  EXAM: PORTABLE CHEST - 1 VIEW  COMPARISON:  09/04/2014  FINDINGS: The right IJ diatek catheter is in good position. The distal tip is at the cavoatrial junction and the proximal tip is in the distal SVC. No pneumothorax or hematoma. The lungs show improved aeration when compared to prior chest film. There is persistent perihilar pulmonary edema and vascular congestion. No definite pleural effusions.  IMPRESSION: Right IJ dialysis catheter in good position without complicating features.  Improved aeration since prior study.   Electronically Signed   By: Loralie Champagne M.D.   On: 09/06/2014 13:21   Dg Chest Port 1 View  09/04/2014   CLINICAL DATA:  81 year old female with shortness of Breath. Initial encounter.  EXAM: PORTABLE CHEST - 1 VIEW  COMPARISON:  09/03/2014 and earlier.  FINDINGS: Portable AP semi upright view at 0702 hrs. Increased basilar predominant interstitial opacity and obscuration of the diaphragm. Increased veiling opacity at both lung bases. Stable cardiac size and mediastinal contours. No pneumothorax. Visualized tracheal air  column is within normal limits.  IMPRESSION: Progressed pulmonary edema, bilateral pleural effusions, and bibasilar atelectasis.   Electronically Signed   By: Augusto Gamble M.D.   On: 09/04/2014 07:21   Dg Chest Port 1 View  09/01/2014   CLINICAL DATA:  Shortness of breath. Stage IV renal disease due to diabetes.  EXAM: PORTABLE CHEST - 1 VIEW  COMPARISON:  08/31/2014  FINDINGS: Normal heart size. Pulmonary vascularity is increasing since prior study with development of slight interstitial pattern in the lung bases. This suggest developing edema. Small left pleural effusion is also developing. Calcification of aorta. No pneumothorax. Mediastinal contours are intact.  IMPRESSION: Developing pulmonary vascular congestion edema with small left pleural effusion.   Electronically Signed   By: Marisa Cyphers.D.  On: 09/01/2014 22:27   Dg Fluoro Guide Cv Line-no Report  09/06/2014   CLINICAL DATA:    FLOURO GUIDE CV LINE  Fluoroscopy was utilized by the requesting physician.  No radiographic  interpretation.     Scheduled Meds: . [START ON 09/21/2014] darbepoetin (ARANESP) injection - DIALYSIS  100 mcg Intravenous Q Sat-HD  . ferric gluconate (FERRLECIT/NULECIT) IV  125 mg Intravenous Q T,Th,Sa-HD  . insulin aspart  0-9 Units Subcutaneous TID WC  . sodium chloride  3 mL Intravenous Q12H   Continuous Infusions:    Time spent: 30 minutes   Lorren Rossetti  [facility] Pager -  [email or web address], password [facility] 09/14/2014, 2:48 PM  LOS: 2 days

## 2014-09-14 NOTE — Progress Notes (Addendum)
Subjective:   Feels great, no complaints. For DC later today  Objective Filed Vitals:   09/14/14 0952 09/14/14 1019 09/14/14 1031 09/14/14 1046  BP: 138/61 139/70 140/61 154/68  Pulse: 69 70 66 65  Temp:    98 F (36.7 C)  TempSrc:    Oral  Resp: Weight:    57.2 kg (126 lb 1.7 oz)  SpO2:    94%   Physical Exam General: alert and oriented. No acute distress.  Heart: RRR Lungs: CTA, unlabored.  Abdomen: soft, nontender. +BS Extremities: no edema Dialysis Access: R IJ/ L AVF +b/t  Dialysis Orders: [facility] -TTS 3.5 hr 180 new start from [facility] 400/800 had first tmt there 2/2 3.5 hr EDW 59 - left at 58.7 3 K 2.25 Ca left AVF, right IJ hearpin 300 Aranesp 40 start 2/4 venofer 100 x 8  Assessment/Plan: 1. Cardiorespiratory arrest - 5 min into HD 2/4, EKG no acute change, initial troponin 0.03. ECHO- EF 35%. ddimer elevated- no evidence of PE 2. ESRD - TTS - HD today - K+ 4.2 pre HD was d/c on a 3 K bath - may be able to change to 2 K bath at discharge- likely eating differently at home. 3. Hypertension/volume - 154/76; under edw- titrate gently; CXR NAD 4. Anemia - Hgb 7.2 has been trending down- transfuse 1 u before DC today. continue ESA (give additional dose today and Fe- she had 1/2 + FOBT last admission and was transfused.2 units 1/27- check stool cards again.  5. Metabolic bone disease - iPTH 170 no vit D yet 6. Nutrition - renal diet  Alb 2.5 7. ^ LFTs - no recent prior LFTs to compare - improved this AM  Jetty Duhamel, NP [facility] Beeper [phone] 09/14/2014,10:54 AM  LOS: 2 days   Renal Attending: Pt clearly better and ready for DC.  Agree with note above. Twanna Resh C   Additional Objective Labs: Basic Metabolic Panel:  Recent Labs Lab 09/07/14 1200 09/12/14 1253 09/13/14 0519 09/14/14 0500  NA 139 137 140 137  K 3.4* 4.5 3.6 4.2  CL 103 101 102 101  CO2 GLUCOSE 139* 112* 70 95  BUN 35* CREATININE 6.21* 8.08* 3.93* 6.84*  CALCIUM 8.0* 8.2* 7.8* 8.1*  PHOS 4.6  --   --   --    Liver Function Tests:  Recent Labs Lab 09/12/14 1253 09/13/14 0519 09/14/14 0500  AST 265* 129* 64*  ALT 51* 31 20  ALKPHOS 73 67 59  BILITOT 0.3 0.2* 0.4  PROT 6.0 5.9* 5.8*  ALBUMIN 2.9* 2.6* 2.5*   No results for input(s): LIPASE, AMYLASE in the last 168 hours. CBC:  Recent Labs Lab 09/07/14 1200 09/12/14 1253 09/12/14 1730 09/13/14 0519 09/14/14 0500  WBC 10.2 14.8*  --  7.9 8.4  HGB 8.5* 8.2*  --  7.7* 7.2*  HCT 26.7* 26.6*  --  24.6* 22.7*  MCV 85.0 88.4  --  86.0 87.0  PLT 222 49* 74* 71* 77*   Blood Culture    Component Value Date/Time   SDES URINE, RANDOM 09/01/2014 1230   SPECREQUEST NONE 09/01/2014 1230   REPTSTATUS 09/03/2014 FINAL 09/01/2014 1230    Cardiac Enzymes:  Recent Labs Lab 09/13/14 1047  TROPONINI 0.06*   CBG:  Recent Labs Lab 09/13/14 0532 09/13/14 0743 09/13/14 1152 09/13/14 1656 09/13/14 2220  GLUCAP 70 105* 92 93 83   Iron Studies:  No results for input(s): IRON, TIBC, TRANSFERRIN, FERRITIN in the last 72 hours. @lablastinr3 @ Studies/Results: Dg Chest 2 View  09/12/2014   CLINICAL DATA:  Syncope.  EXAM: CHEST  2 VIEW  COMPARISON:  September 06, 2014.  FINDINGS: The heart size and mediastinal contours are within normal limits. Both lungs are clear. No pneumothorax or pleural effusion is noted. Right internal jugular dialysis catheter is again noted with distal tip in expected position of the SVC. The visualized skeletal structures are unremarkable.  IMPRESSION: No acute cardiopulmonary abnormality seen.   Electronically Signed   By: Roque LiasJames  Green M.D.   On: 09/12/2014 14:08   Nm Pulmonary Perf And Vent  09/13/2014   CLINICAL DATA:  Shortness of breath. Syncopal episode during dialysis rib wiring chest compressions. Chest pain.  EXAM: NUCLEAR MEDICINE VENTILATION - PERFUSION LUNG SCAN  TECHNIQUE: Ventilation images were obtained in multiple  projections using inhaled aerosol technetium 99 M DTPA. Perfusion images were obtained in multiple projections after intravenous injection of Tc-5430m MAA.  RADIOPHARMACEUTICALS:  40.0 mCi Tc-1030m DTPA aerosol and 6.0 mCi Tc-3530m MAA  COMPARISON:  Two-view chest x-ray 09/12/2014.  FINDINGS: Ventilation: There is some clumping of the radiotracer about the left hilum. This is likely artifactual. No defects are evident.  Perfusion: No wedge shaped peripheral perfusion defects to suggest acute pulmonary embolism.  IMPRESSION: No evidence for pulmonary embolism.   Electronically Signed   By: Gennette Pachris  Mattern M.D.   On: 09/13/2014 10:38   Medications:   . sodium chloride   Intravenous Once  . [START ON 09/21/2014] darbepoetin (ARANESP) injection - DIALYSIS  100 mcg Intravenous Q Sat-HD  . ferric gluconate (FERRLECIT/NULECIT) IV  125 mg Intravenous Q T,Th,Sa-HD  . insulin aspart  0-9 Units Subcutaneous TID WC  . sodium chloride  3 mL Intravenous Q12H

## 2014-09-14 NOTE — Progress Notes (Signed)
VASCULAR LAB PRELIMINARY  PRELIMINARY  PRELIMINARY  PRELIMINARY  Bilateral lower extremity venous Dopplers completed.    Preliminary report:  There is no DVT or SVT noted in the bilateral lower extremities.   Tyce Delcid, RVT 09/14/2014, 12:13 PM

## 2014-09-14 NOTE — Evaluation (Addendum)
Physical Therapy Evaluation Patient Details Name: Natalie ReaderGeorgianna Demorest MRN: [id number] DOB: 30-Oct-1929 Today's Date: 09/14/2014   History of Present Illness  81 year old upcoming female with past medical history of hypertension, diabetes, and recently diagnosed ESRD just started on HD came in after cardiac arrest 5 min into outpt dialysis  Clinical Impression  Patient mobilizing well with RW, modified independent with activity, no further acute PT needs will sign off.     Follow Up Recommendations May resume HHPT    Equipment Recommendations  No equipment needs   Recommendations for Other Services       Precautions / Restrictions Precautions Precautions: Fall Restrictions Weight Bearing Restrictions: No      Mobility  Bed Mobility Overal bed mobility: Modified Independent             General bed mobility comments: used rail to come to EOB  Transfers Overall transfer level: Independent Equipment used: Rolling walker (2 wheeled) Transfers: Sit to/from Stand Sit to Stand: Independent            Ambulation/Gait Ambulation/Gait assistance: Modified independent (Device/Increase time) Ambulation Distance (Feet): 210 Feet Assistive device: Rolling walker (2 wheeled)          Stairs            Wheelchair Mobility    Modified Rankin (Stroke Patients Only)       Balance Overall balance assessment: No apparent balance deficits (not formally assessed)                                           Pertinent Vitals/Pain Pain Assessment: No/denies pain    Home Living Family/patient expects to be discharged to::  (Independent Living) Living Arrangements: Alone Available Help at Discharge: Family;Available PRN/intermittently Type of Home: Independent living facility Home Access: Level entry     Home Layout: One level Home Equipment: None;Grab bars - toilet;Grab bars - tub/shower;Hand held shower head;Shower seat      Prior  Function Level of Independence: Independent         Comments: but with recent falls     Hand Dominance   Dominant Hand: Right    Extremity/Trunk Assessment   Upper Extremity Assessment: Overall WFL for tasks assessed           Lower Extremity Assessment: Overall WFL for tasks assessed         Communication   Communication: No difficulties  Cognition Arousal/Alertness: Awake/alert Behavior During Therapy: WFL for tasks assessed/performed Overall Cognitive Status: Within Functional Limits for tasks assessed                      General Comments      Exercises        Assessment/Plan    PT Assessment Patent does not need any further PT services  PT Diagnosis Difficulty walking   PT Problem List    PT Treatment Interventions     PT Goals (Current goals can be found in the Care Plan section) Acute Rehab PT Goals Patient Stated Goal: home today PT Goal Formulation: With patient Time For Goal Achievement: 09/22/14 Potential to Achieve Goals: Good    Frequency     Barriers to discharge        Co-evaluation               End of Session Equipment Utilized During Treatment: Gait belt Activity  Tolerance: Patient tolerated treatment well Patient left: in chair;with call bell/phone within reach Nurse Communication: Mobility status         Time: 1201-1219 PT Time Calculation (min) (ACUTE ONLY): 18 min   Charges:   PT Evaluation $Initial PT Evaluation Tier I: 1 Procedure     PT G CodesFabio Asa 2014/09/28, 1:28 PM Charlotte Crumb, PT DPT  [phone]

## 2014-09-15 LAB — BASIC METABOLIC PANEL
Anion gap: 9 (ref 5–15)
BUN: 8 mg/dL (ref 6–23)
CO2: 30 mmol/L (ref 19–32)
Calcium: 8.2 mg/dL — ABNORMAL LOW (ref 8.4–10.5)
Chloride: 97 mmol/L (ref 96–112)
Creatinine, Ser: 4.38 mg/dL — ABNORMAL HIGH (ref 0.50–1.10)
GFR, EST AFRICAN AMERICAN: 10 mL/min — AB (ref >90)
GFR, EST NON AFRICAN AMERICAN: 9 mL/min — AB (ref >90)
Glucose, Bld: 109 mg/dL — ABNORMAL HIGH (ref 70–99)
Potassium: 3.5 mmol/L (ref 3.5–5.1)
SODIUM: 136 mmol/L (ref 135–145)

## 2014-09-15 LAB — TYPE AND SCREEN
ABO/RH(D): B POS
Antibody Screen: NEGATIVE
UNIT DIVISION: 0

## 2014-09-15 LAB — CBC
HCT: 30.4 % — ABNORMAL LOW (ref 36.0–46.0)
Hemoglobin: 9.5 g/dL — ABNORMAL LOW (ref 12.0–15.0)
MCH: 27.5 pg (ref 26.0–34.0)
MCHC: 31.3 g/dL (ref 30.0–36.0)
MCV: 88.1 fL (ref 78.0–100.0)
PLATELETS: 79 10*3/uL — AB (ref 150–400)
RBC: 3.45 MIL/uL — ABNORMAL LOW (ref 3.87–5.11)
RDW: 17.7 % — AB (ref 11.5–15.5)
WBC: 8.5 10*3/uL (ref 4.0–10.5)

## 2014-09-15 LAB — GLUCOSE, CAPILLARY
GLUCOSE-CAPILLARY: 156 mg/dL — AB (ref 70–99)
GLUCOSE-CAPILLARY: 82 mg/dL (ref 70–99)
Glucose-Capillary: 95 mg/dL (ref 70–99)
Glucose-Capillary: 114 mg/dL — ABNORMAL HIGH (ref 70–99)
Glucose-Capillary: 105 mg/dL — ABNORMAL HIGH (ref 70–99)

## 2014-09-15 LAB — OCCULT BLOOD X 1 CARD TO LAB, STOOL: FECAL OCCULT BLD: NEGATIVE

## 2014-09-15 NOTE — Progress Notes (Signed)
[facility] PROGRESS NOTE  Natalie Wolfe ZOX:[id number] DOB: 07-10-1930 DOA: 09/12/2014 PCP: [name], MD   Brief narrative: 81 year old upcoming female with past medical history of hypertension, diabetes, and recently diagnosed ESRD just started on HD. She was just diagnosed ESRD in January and underwent left-sided AV fistula placement by vascular surgery on 09/06/2014. She had one dialysis as inpatient before her discharge.   Post discharge, patient has been feeling very well. She denies any recent CP, SOB, fever, chill or cough. Active. Able to go to store without any problems.Denies any h/o known heart disease. Echo 09/03/14 EF normal no regional WMA. Moderate to severe MR.   She started on her new medication of labetalol 100 mg twice a day. This morning, patient woke up feeling fine. She took public transportation to her dialysis center. 5 minutes into hemodialysis, she yelled to nursing staff that she had significant shortness breath before becoming unresponsive. CPR was initiated after they failed to find her pulse. It it unknown how long her CPR was performed. She eventually had spontaneous return of pulse. She was transported to [facility] for further evaluation.    HPI/Subjective: No complaints today- she cannot even recall whether she was given a blood transfusion yesterday.    Assessment/Plan: Cardiopulmonary arrest Reportedly only 5 minutes after started on dialysis without evidence of hypotension Cardiology initially doubtful that this is a primary ischemic event but now has a new ECHO finding of EF of 35% therefore, plans are for cath on Monday- also recommended to have a life vest VQ scan negative for PE, venous Doppler pending Troponin mildly elevated.  Anemia due to ESRD - Hb 7.2- have brought this up to renal team - theyordered a blood transfusion of 1 U yesterday- Hb now at 9/5 - FOBT negative but was positive a few wks ago- will check another  stool card  ESRD Hemodialysis Tuesday Thursday Saturday  Thrombocytopenia, improving Unclear etiology, no evidence of bleeding, platelets were normal on 09/07/14. Follow HIT panel pending, no heparin products for DVT prophylaxis or dialysis.   Elevated LFTs, shock liver? AST/L ALT are increased, unclear etiology. Thought initially to be secondary to hypotension. Improving  Diabetes mellitus type 2 Hemoglobin A1c was 4.7 on 08/29/14 indicating tight glycemic control. Started on sensitive SSI and carb modified diet. Amaryl on hold with controlled sugars   Code Status: full Family Communication: Disposition Plan:  Cath on Monday per cardiology   Consultants:  Cardiology  Nephrology   Antibiotics: Anti-infectives    None      Objective: Filed Vitals:   09/14/14 1739 09/14/14 2132 09/15/14 0450 09/15/14 1042  BP: 162/57 152/60 157/63 149/62  Pulse: 68 70 61 61  Temp: 98.3 F (36.8 C) 98.3 F (36.8 C) 99.3 F (37.4 C) 99.1 F (37.3 C)  TempSrc: Oral Oral Oral Oral  Resp: Weight:  56.518 kg (124 lb 9.6 oz)    SpO2: 99% 98% 98% 99%    Intake/Output Summary (Last 24 hours) at 09/15/14 1228 Last data filed at 09/15/14 0700  Gross per 24 hour  Intake    945 ml  Output      0 ml  Net    945 ml    Exam:  General: AAO x3, No acute respiratory distress Lungs: Clear to auscultation bilaterally without wheezes or crackles Cardiovascular: Regular rate and rhythm without murmur gallop or rub normal S1 and S2 Abdomen: Nontender, nondistended, soft, bowel sounds positive, no rebound, no  ascites, no appreciable mass Extremities: No significant cyanosis, clubbing, or edema bilateral lower extremities  Data Reviewed: Basic Metabolic Panel:  Recent Labs Lab 09/12/14 1253 09/13/14 0519 09/14/14 0500 09/15/14 0622  NA 137 140 137 136  K 4.5 3.6 4.2 3.5  CL 101 102 101 97  CO2 24 30 28 30   GLUCOSE 112* 70 95 109*  BUN 20 8 11 8   CREATININE 8.08*  3.93* 6.84* 4.38*  CALCIUM 8.2* 7.8* 8.1* 8.2*    Liver Function Tests:  Recent Labs Lab 09/12/14 1253 09/13/14 0519 09/14/14 0500  AST 265* 129* 64*  ALT 51* 31 20  ALKPHOS 73 67 59  BILITOT 0.3 0.2* 0.4  PROT 6.0 5.9* 5.8*  ALBUMIN 2.9* 2.6* 2.5*   No results for input(s): LIPASE, AMYLASE in the last 168 hours. No results for input(s): AMMONIA in the last 168 hours.  CBC:  Recent Labs Lab 09/12/14 1253 09/12/14 1730 09/13/14 0519 09/14/14 0500 09/15/14 0622  WBC 14.8*  --  7.9 8.4 8.5  HGB 8.2*  --  7.7* 7.2* 9.5*  HCT 26.6*  --  24.6* 22.7* 30.4*  MCV 88.4  --  86.0 87.0 88.1  PLT 49* 74* 71* 77* 79*    Cardiac Enzymes:  Recent Labs Lab 09/13/14 1047  TROPONINI 0.06*   BNP (last 3 results) No results for input(s): BNP in the last 8760 hours.  ProBNP (last 3 results) No results for input(s): PROBNP in the last 8760 hours.    CBG:  Recent Labs Lab 09/14/14 1758 09/14/14 1825 09/14/14 2125 09/15/14 0439 09/15/14 0748  GLUCAP 51* 104* 122* 95 114*    No results found for this or any previous visit (from the past 240 hour(s)).   Studies: Dg Chest 2 View  09/12/2014   CLINICAL DATA:  Syncope.  EXAM: CHEST  2 VIEW  COMPARISON:  September 06, 2014.  FINDINGS: The heart size and mediastinal contours are within normal limits. Both lungs are clear. No pneumothorax or pleural effusion is noted. Right internal jugular dialysis catheter is again noted with distal tip in expected position of the SVC. The visualized skeletal structures are unremarkable.  IMPRESSION: No acute cardiopulmonary abnormality seen.   Electronically Signed   By: Roque LiasJames  Green M.D.   On: 09/12/2014 14:08   Dg Chest 2 View  09/03/2014   CLINICAL DATA:  81 year old female with shortness breath for 2-3 days.  EXAM: CHEST  2 VIEW  COMPARISON:  Chest x-ray 09/01/2014.  FINDINGS: There is cephalization of the pulmonary vasculature and slight indistinctness of the interstitial markings  suggestive of mild pulmonary edema. Small left pleural effusion. Opacity in the medial aspect of the left lung base may reflect atelectasis and/or consolidation. Heart size appears normal. Upper mediastinal contours are within normal limits. Atherosclerosis in the thoracic aorta.  IMPRESSION: 1. There appears to be mild interstitial pulmonary edema. Given the normal heart size, clinical evaluation for cause of noncardiogenic pulmonary edema suggested. 2. Small left pleural effusion. 3. Developing opacity in the medial aspect of the left lung base may reflect underlying atelectasis, or airspace consolidation from infection or aspiration. 4. Atherosclerosis.   Electronically Signed   By: Trudie Reedaniel  Entrikin M.D.   On: 09/03/2014 13:33   Dg Chest 2 View  08/31/2014   CLINICAL DATA:  Weakness for 2 days  EXAM: CHEST  2 VIEW  COMPARISON:  None.  FINDINGS: Cardiac shadow is within normal limits. The lungs are well aerated bilaterally. Very minimal atelectatic changes are noted  in the posterior costophrenic angle on the lateral projection likely in the posterior right lower lobe.  IMPRESSION: Minimal right lower lobe atelectasis.   Electronically Signed   By: Alcide Clever M.D.   On: 08/31/2014 07:13   US Renal  08/31/2014   CLINICAL DATA:  Acute kidney injury. Chronic kidney disease stage 2. Diabetes and hypertension  EXAM: RENAL/URINARY TRACT ULTRASOUND COMPLETE  COMPARISON:  None.  FINDINGS: Right Kidney:  Length: 7 cm. Increased parenchymal echogenicity. Inferior pole cyst measures 6 mm. No mass or hydronephrosis noted.  Left Kidney:  Length: 9.1 cm. Increased parenchymal echogenicity. Inferior pole cyst measures 3.1 x 3.1 x 3.0 cm. No mass or hydronephrosis.  Bladder:  Collapsed around a Foley catheter.  Other:  Bilateral pleural effusions noted.  IMPRESSION: 1. No obstructive uropathy. 2. Bilateral renal atrophy and increased parenchymal echogenicity compatible with chronic medical renal disease.   Electronically  Signed   By: Signa Kell M.D.   On: 08/31/2014 12:57   Nm Pulmonary Perf And Vent  09/13/2014   CLINICAL DATA:  Shortness of breath. Syncopal episode during dialysis rib wiring chest compressions. Chest pain.  EXAM: NUCLEAR MEDICINE VENTILATION - PERFUSION LUNG SCAN  TECHNIQUE: Ventilation images were obtained in multiple projections using inhaled aerosol technetium 99 M DTPA. Perfusion images were obtained in multiple projections after intravenous injection of Tc-72m MAA.  RADIOPHARMACEUTICALS:  40.0 mCi Tc-3m DTPA aerosol and 6.0 mCi Tc-31m MAA  COMPARISON:  Two-view chest x-ray 09/12/2014.  FINDINGS: Ventilation: There is some clumping of the radiotracer about the left hilum. This is likely artifactual. No defects are evident.  Perfusion: No wedge shaped peripheral perfusion defects to suggest acute pulmonary embolism.  IMPRESSION: No evidence for pulmonary embolism.   Electronically Signed   By: Gennette Pac M.D.   On: 09/13/2014 10:38   Dg Chest Port 1 View  09/06/2014   CLINICAL DATA:  Status post diatek catheter placement.  EXAM: PORTABLE CHEST - 1 VIEW  COMPARISON:  09/04/2014  FINDINGS: The right IJ diatek catheter is in good position. The distal tip is at the cavoatrial junction and the proximal tip is in the distal SVC. No pneumothorax or hematoma. The lungs show improved aeration when compared to prior chest film. There is persistent perihilar pulmonary edema and vascular congestion. No definite pleural effusions.  IMPRESSION: Right IJ dialysis catheter in good position without complicating features.  Improved aeration since prior study.   Electronically Signed   By: Loralie Champagne M.D.   On: 09/06/2014 13:21   Dg Chest Port 1 View  09/04/2014   CLINICAL DATA:  81 year old female with shortness of Breath. Initial encounter.  EXAM: PORTABLE CHEST - 1 VIEW  COMPARISON:  09/03/2014 and earlier.  FINDINGS: Portable AP semi upright view at 0702 hrs. Increased basilar predominant interstitial  opacity and obscuration of the diaphragm. Increased veiling opacity at both lung bases. Stable cardiac size and mediastinal contours. No pneumothorax. Visualized tracheal air column is within normal limits.  IMPRESSION: Progressed pulmonary edema, bilateral pleural effusions, and bibasilar atelectasis.   Electronically Signed   By: Augusto Gamble M.D.   On: 09/04/2014 07:21   Dg Chest Port 1 View  09/01/2014   CLINICAL DATA:  Shortness of breath. Stage IV renal disease due to diabetes.  EXAM: PORTABLE CHEST - 1 VIEW  COMPARISON:  08/31/2014  FINDINGS: Normal heart size. Pulmonary vascularity is increasing since prior study with development of slight interstitial pattern in the lung bases. This suggest developing edema. Small  left pleural effusion is also developing. Calcification of aorta. No pneumothorax. Mediastinal contours are intact.  IMPRESSION: Developing pulmonary vascular congestion edema with small left pleural effusion.   Electronically Signed   By: Burman Nieves M.D.   On: 09/01/2014 22:27   Dg Fluoro Guide Cv Line-no Report  09/06/2014   CLINICAL DATA:    FLOURO GUIDE CV LINE  Fluoroscopy was utilized by the requesting physician.  No radiographic  interpretation.     Scheduled Meds: . [START ON 09/21/2014] darbepoetin (ARANESP) injection - DIALYSIS  100 mcg Intravenous Q Sat-HD  . ferric gluconate (FERRLECIT/NULECIT) IV  125 mg Intravenous Q T,Th,Sa-HD  . insulin aspart  0-9 Units Subcutaneous TID WC  . sodium chloride  3 mL Intravenous Q12H   Continuous Infusions:    Time spent: 30 minutes   Natalie Wolfe  [facility] Pager -  [email or web address], password [facility] 09/15/2014, 12:28 PM  LOS: 3 days

## 2014-09-15 NOTE — ED Provider Notes (Signed)
CSN: [id number]     Arrival date & time 09/12/14  1244 History   First MD Initiated Contact with Patient 09/12/14 1250     Chief Complaint  Patient presents with  . post chest compressions      (Consider location/radiation/quality/duration/timing/severity/associated sxs/prior Treatment) Patient is a 81 y.o. female presenting with syncope. The history is provided by the patient.  Loss of Consciousness Episode history:  Single Most recent episode:  Today Timing:  Constant Progression:  Resolved Chronicity:  New Context comment:  During dialysis Witnessed: yes   Relieved by:  Nothing Associated symptoms: no anxiety, no chest pain, no fever, no shortness of breath and no vomiting     Past Medical History  Diagnosis Date  . Urinary frequency   . Routine general medical examination at a health care facility   . Other atopic dermatitis and related conditions   . Rash and other nonspecific skin eruption   . Other anxiety states   . Insomnia, unspecified   . Chronic kidney disease, stage II (mild)   . Type II or unspecified type diabetes mellitus with renal manifestations, not stated as uncontrolled   . PVD (peripheral vascular disease)   . Unspecified vitamin D deficiency   . Anemia, unspecified   . Obesity, unspecified   . Nocturia   . Pain in joint, lower leg   . Other and unspecified hyperlipidemia   . Unspecified essential hypertension   . Cataract    Past Surgical History  Procedure Laterality Date  . Abdominal hysterectomy  1970  . Breast surgery  1979    nodule removed  . Eye surgery  2004    catarcts removed from right eye.  . Multiple tooth extractions  09/2013    Remonve remaining 3 teeth, no with dentures   . Av fistula placement Left 09/06/2014    Procedure: CREATION OF LEFT UPPER ARM ARTERIOVENOUS (AV) FISTULA ;  Surgeon: Pryor Ochoa, MD;  Location: [facility] OR;  Service: Vascular;  Laterality: Left;  . Insertion of dialysis catheter N/A 09/06/2014    Procedure:  INSERTION OF DIALYSIS CATHETER RIGHT INTERNAL JUGULAR VEIN;  Surgeon: Pryor Ochoa, MD;  Location: [facility] OR;  Service: Vascular;  Laterality: N/A;  . Ligation of competing branches of arteriovenous fistula Left 09/06/2014    Procedure: LIGATION OF COMPETING BRANCHES OF ARTERIOVENOUS FISTULA;  Surgeon: Pryor Ochoa, MD;  Location: [facility] OR;  Service: Vascular;  Laterality: Left;   Family History  Problem Relation Age of Onset  . Diabetes Mother    History  Substance Use Topics  . Smoking status: Former Smoker    Types: Cigarettes    Quit date: 08/10/2000  . Smokeless tobacco: Not on file  . Alcohol Use: No   OB History    No data available     Review of Systems  Constitutional: Negative for fever.  Respiratory: Negative for cough and shortness of breath.   Cardiovascular: Positive for syncope. Negative for chest pain.  Gastrointestinal: Negative for vomiting and abdominal pain.  All other systems reviewed and are negative.     Allergies  Review of patient's allergies indicates no known allergies.  Home Medications   Prior to Admission medications   Medication Sig Start Date End Date Taking? Authorizing Provider  acetaminophen (TYLENOL) 325 MG tablet Take 650 mg by mouth daily as needed for mild pain.    Yes Historical Provider, MD  amLODipine (NORVASC) 5 MG tablet Take 5 mg by mouth daily. 08/16/14  Yes  Historical Provider, MD  calcium acetate (PHOSLO) 667 MG capsule Take 1 capsule (667 mg total) by mouth 3 (three) times daily with meals. 09/09/14  Yes Belkys A Regalado, MD  fluocinonide cream (LIDEX) 0.05 % Apply 1 application topically 2 (two) times daily. Apply from neck down twice a day (provided by dermatology).   Yes Historical Provider, MD  glimepiride (AMARYL) 2 MG tablet Take 1 mg by mouth daily. 08/30/14  Yes Historical Provider, MD  glucose blood (BAYER CONTOUR NEXT TEST) test strip Use as instructed 11/27/12  Yes Cathey Miller, RPH-CPP  hydrocortisone 2.5 % lotion Apply 1  application topically 2 (two) times daily. Apply to skin and face 10/18/12  Yes Historical Provider, MD  labetalol (NORMODYNE) 100 MG tablet Take 1 tablet (100 mg total) by mouth 2 (two) times daily. 09/09/14  Yes Belkys A Regalado, MD  multivitamin (RENA-VIT) TABS tablet Take 1 tablet by mouth at bedtime. 09/09/14  Yes Belkys A Regalado, MD  VESICARE 5 MG tablet Take 5 mg by mouth daily. 08/20/14  Yes Historical Provider, MD   BP 152/60 mmHg  Pulse 70  Temp(Src) 98.3 F (36.8 C) (Oral)  Resp 16  Wt 124 lb 9.6 oz (56.518 kg)  SpO2 98% Physical Exam  Constitutional: She is oriented to person, place, and time. She appears well-developed and well-nourished. No distress.  HENT:  Head: Normocephalic and atraumatic.  Mouth/Throat: Oropharynx is clear and moist.  Eyes: EOM are normal. Pupils are equal, round, and reactive to light.  Neck: Normal range of motion. Neck supple.  Cardiovascular: Normal rate and regular rhythm.  Exam reveals no friction rub.   No murmur heard. Pulmonary/Chest: Effort normal and breath sounds normal. No respiratory distress. She has no wheezes. She has no rales.  Abdominal: Soft. She exhibits no distension. There is no tenderness. There is no rebound.  Musculoskeletal: Normal range of motion. She exhibits no edema.  Neurological: She is alert and oriented to person, place, and time.  Skin: She is not diaphoretic.  Nursing note and vitals reviewed.   ED Course  Procedures (including critical care time) Labs Review Labs Reviewed  CBC - Abnormal; Notable for the following:    WBC 14.8 (*)    RBC 3.01 (*)    Hemoglobin 8.2 (*)    HCT 26.6 (*)    RDW 18.9 (*)    Platelets 49 (*)    All other components within normal limits  COMPREHENSIVE METABOLIC PANEL - Abnormal; Notable for the following:    Glucose, Bld 112 (*)    Creatinine, Ser 8.08 (*)    Calcium 8.2 (*)    Albumin 2.9 (*)    AST 265 (*)    ALT 51 (*)    GFR calc non Af Amer 4 (*)    GFR calc Af Amer  5 (*)    All other components within normal limits  DIC (DISSEMINATED INTRAVASCULAR COAGULATION) PANEL - Abnormal; Notable for the following:    aPTT 45 (*)    Fibrinogen 552 (*)    D-Dimer, Quant 9.57 (*)    Platelets 74 (*)    All other components within normal limits  GLUCOSE, CAPILLARY - Abnormal; Notable for the following:    Glucose-Capillary 68 (*)    All other components within normal limits  COMPREHENSIVE METABOLIC PANEL - Abnormal; Notable for the following:    Creatinine, Ser 3.93 (*)    Calcium 7.8 (*)    Total Protein 5.9 (*)    Albumin 2.6 (*)  AST 129 (*)    Total Bilirubin 0.2 (*)    GFR calc non Af Amer 10 (*)    GFR calc Af Amer 11 (*)    All other components within normal limits  CBC - Abnormal; Notable for the following:    RBC 2.86 (*)    Hemoglobin 7.7 (*)    HCT 24.6 (*)    RDW 18.8 (*)    Platelets 71 (*)    All other components within normal limits  GLUCOSE, CAPILLARY - Abnormal; Notable for the following:    Glucose-Capillary 67 (*)    All other components within normal limits  GLUCOSE, CAPILLARY - Abnormal; Notable for the following:    Glucose-Capillary 152 (*)    All other components within normal limits  GLUCOSE, CAPILLARY - Abnormal; Notable for the following:    Glucose-Capillary 105 (*)    All other components within normal limits  TROPONIN I - Abnormal; Notable for the following:    Troponin I 0.06 (*)    All other components within normal limits  CBC - Abnormal; Notable for the following:    RBC 2.61 (*)    Hemoglobin 7.2 (*)    HCT 22.7 (*)    RDW 18.9 (*)    Platelets 77 (*)    All other components within normal limits  COMPREHENSIVE METABOLIC PANEL - Abnormal; Notable for the following:    Creatinine, Ser 6.84 (*)    Calcium 8.1 (*)    Total Protein 5.8 (*)    Albumin 2.5 (*)    AST 64 (*)    GFR calc non Af Amer 5 (*)    GFR calc Af Amer 6 (*)    All other components within normal limits  GLUCOSE, CAPILLARY - Abnormal;  Notable for the following:    Glucose-Capillary 151 (*)    All other components within normal limits  GLUCOSE, CAPILLARY - Abnormal; Notable for the following:    Glucose-Capillary 39 (*)    All other components within normal limits  GLUCOSE, CAPILLARY - Abnormal; Notable for the following:    Glucose-Capillary 51 (*)    All other components within normal limits  GLUCOSE, CAPILLARY - Abnormal; Notable for the following:    Glucose-Capillary 104 (*)    All other components within normal limits  GLUCOSE, CAPILLARY - Abnormal; Notable for the following:    Glucose-Capillary 122 (*)    All other components within normal limits  TSH  GLUCOSE, CAPILLARY  GLUCOSE, CAPILLARY  GLUCOSE, CAPILLARY  GLUCOSE, CAPILLARY  GLUCOSE, CAPILLARY  GLUCOSE, CAPILLARY  HEMOGLOBIN A1C  HEPARIN INDUCED THROMBOCYTOPENIA PNL  OCCULT BLOOD X 1 CARD TO LAB, STOOL  BASIC METABOLIC PANEL  CBC  I-STAT TROPOININ, ED  CBG MONITORING, ED  TYPE AND SCREEN  PREPARE RBC (CROSSMATCH)    Imaging Review Nm Pulmonary Perf And Vent  09/13/2014   CLINICAL DATA:  Shortness of breath. Syncopal episode during dialysis rib wiring chest compressions. Chest pain.  EXAM: NUCLEAR MEDICINE VENTILATION - PERFUSION LUNG SCAN  TECHNIQUE: Ventilation images were obtained in multiple projections using inhaled aerosol technetium 99 M DTPA. Perfusion images were obtained in multiple projections after intravenous injection of Tc-6972m MAA.  RADIOPHARMACEUTICALS:  40.0 mCi Tc-2472m DTPA aerosol and 6.0 mCi Tc-6072m MAA  COMPARISON:  Two-view chest x-ray 09/12/2014.  FINDINGS: Ventilation: There is some clumping of the radiotracer about the left hilum. This is likely artifactual. No defects are evident.  Perfusion: No wedge shaped peripheral perfusion defects to suggest acute  pulmonary embolism.  IMPRESSION: No evidence for pulmonary embolism.   Electronically Signed   By: Gennette Pac M.D.   On: 09/13/2014 10:38     EKG Interpretation None       MDM   Final diagnoses:  Syncope    34F here after syncopal event at dialysis. Received 2 minutes of CPR with ROSC and now patient alert, following commands. Doubt she was actually pulseless. Feeling well, denied any CP, SOB. Workup benign. Admitted for her syncope.    Elwin Mocha, MD 09/15/14 [phone]

## 2014-09-15 NOTE — Progress Notes (Signed)
Patient ID: Natalie Wolfe, female   DOB: 08-13-1929, 81 y.o.   MRN: [id number]    Patient Name: Natalie Wolfe Date of Encounter: 09/15/2014     Principal Problem:   Cardiopulmonary arrest Active Problems:   DM type 2, uncontrolled, with renal complications   Anemia of chronic disease   Syncope   ESRD (end stage renal disease)   Hypotension   Thrombocytopenia   Troponin level elevated-0.2    SUBJECTIVE  Denies chest pain or sob.   CURRENT MEDS . [START ON 09/21/2014] darbepoetin (ARANESP) injection - DIALYSIS  100 mcg Intravenous Q Sat-HD  . ferric gluconate (FERRLECIT/NULECIT) IV  125 mg Intravenous Q T,Th,Sa-HD  . insulin aspart  0-9 Units Subcutaneous TID WC  . sodium chloride  3 mL Intravenous Q12H    OBJECTIVE  Filed Vitals:   09/14/14 1739 09/14/14 2132 09/15/14 0450 09/15/14 1042  BP: 162/57 152/60 157/63 149/62  Pulse: 68 70 61 61  Temp: 98.3 F (36.8 C) 98.3 F (36.8 C) 99.3 F (37.4 C) 99.1 F (37.3 C)  TempSrc: Oral Oral Oral Oral  Resp: Weight:  124 lb 9.6 oz (56.518 kg)    SpO2: 99% 98% 98% 99%    Intake/Output Summary (Last 24 hours) at 09/15/14 1200 Last data filed at 09/15/14 0700  Gross per 24 hour  Intake    945 ml  Output      0 ml  Net    945 ml   Filed Weights   09/14/14 0702 09/14/14 1046 09/14/14 2132  Weight: 128 lb 8.5 oz (58.3 kg) 126 lb 1.7 oz (57.2 kg) 124 lb 9.6 oz (56.518 kg)    PHYSICAL EXAM  General: Pleasant, NAD. Neuro: Alert and oriented X 3. Moves all extremities spontaneously. Psych: Normal affect. HEENT:  Normal  Neck: Supple without bruits or JVD.Indwelling Right sided HD catheter Lungs:  Resp regular and unlabored, CTA. Heart: RRR no s3, s4, or murmurs. Abdomen: Soft, non-tender, non-distended, BS + x 4.  Extremities: No clubbing, cyanosis or edema. DP/PT/Radials 2+ and equal bilaterally.  Accessory Clinical Findings  CBC  Recent Labs  09/14/14 0500 09/15/14 0622  WBC 8.4 8.5    HGB 7.2* 9.5*  HCT 22.7* 30.4*  MCV 87.0 88.1  PLT 77* 79*   Basic Metabolic Panel  Recent Labs  09/14/14 0500 09/15/14 0622  NA 137 136  K 4.2 3.5  CL 101 97  CO2 28 30  GLUCOSE 95 109*  BUN 11 8  CREATININE 6.84* 4.38*  CALCIUM 8.1* 8.2*   Liver Function Tests  Recent Labs  09/13/14 0519 09/14/14 0500  AST 129* 64*  ALT 31 20  ALKPHOS 67 59  BILITOT 0.2* 0.4  PROT 5.9* 5.8*  ALBUMIN 2.6* 2.5*   No results for input(s): LIPASE, AMYLASE in the last 72 hours. Cardiac Enzymes  Recent Labs  09/13/14 1047  TROPONINI 0.06*   BNP Invalid input(s): POCBNP D-Dimer  Recent Labs  09/12/14 1730  DDIMER 9.57*   Hemoglobin A1C No results for input(s): HGBA1C in the last 72 hours. Fasting Lipid Panel No results for input(s): CHOL, HDL, LDLCALC, TRIG, CHOLHDL, LDLDIRECT in the last 72 hours. Thyroid Function Tests  Recent Labs  09/12/14 1730  TSH 3.464    TELE  nsr  Radiology/Studies  Dg Chest 2 View  09/12/2014   CLINICAL DATA:  Syncope.  EXAM: CHEST  2 VIEW  COMPARISON:  September 06, 2014.  FINDINGS: The heart size and mediastinal contours  are within normal limits. Both lungs are clear. No pneumothorax or pleural effusion is noted. Right internal jugular dialysis catheter is again noted with distal tip in expected position of the SVC. The visualized skeletal structures are unremarkable.  IMPRESSION: No acute cardiopulmonary abnormality seen.   Electronically Signed   By: Roque LiasJames  Green M.D.   On: 09/12/2014 14:08   Dg Chest 2 View  09/03/2014   CLINICAL DATA:  81 year old female with shortness breath for 2-3 days.  EXAM: CHEST  2 VIEW  COMPARISON:  Chest x-ray 09/01/2014.  FINDINGS: There is cephalization of the pulmonary vasculature and slight indistinctness of the interstitial markings suggestive of mild pulmonary edema. Small left pleural effusion. Opacity in the medial aspect of the left lung base may reflect atelectasis and/or consolidation. Heart size  appears normal. Upper mediastinal contours are within normal limits. Atherosclerosis in the thoracic aorta.  IMPRESSION: 1. There appears to be mild interstitial pulmonary edema. Given the normal heart size, clinical evaluation for cause of noncardiogenic pulmonary edema suggested. 2. Small left pleural effusion. 3. Developing opacity in the medial aspect of the left lung base may reflect underlying atelectasis, or airspace consolidation from infection or aspiration. 4. Atherosclerosis.   Electronically Signed   By: Trudie Reedaniel  Entrikin M.D.   On: 09/03/2014 13:33   Dg Chest 2 View  08/31/2014   CLINICAL DATA:  Weakness for 2 days  EXAM: CHEST  2 VIEW  COMPARISON:  None.  FINDINGS: Cardiac shadow is within normal limits. The lungs are well aerated bilaterally. Very minimal atelectatic changes are noted in the posterior costophrenic angle on the lateral projection likely in the posterior right lower lobe.  IMPRESSION: Minimal right lower lobe atelectasis.   Electronically Signed   By: Alcide CleverMark  Lukens M.D.   On: 08/31/2014 07:13   [place] Renal  08/31/2014   CLINICAL DATA:  Acute kidney injury. Chronic kidney disease stage 2. Diabetes and hypertension  EXAM: RENAL/URINARY TRACT ULTRASOUND COMPLETE  COMPARISON:  None.  FINDINGS: Right Kidney:  Length: 7 cm. Increased parenchymal echogenicity. Inferior pole cyst measures 6 mm. No mass or hydronephrosis noted.  Left Kidney:  Length: 9.1 cm. Increased parenchymal echogenicity. Inferior pole cyst measures 3.1 x 3.1 x 3.0 cm. No mass or hydronephrosis.  Bladder:  Collapsed around a Foley catheter.  Other:  Bilateral pleural effusions noted.  IMPRESSION: 1. No obstructive uropathy. 2. Bilateral renal atrophy and increased parenchymal echogenicity compatible with chronic medical renal disease.   Electronically Signed   By: Signa Kellaylor  Stroud M.D.   On: 08/31/2014 12:57   Nm Pulmonary Perf And Vent  09/13/2014   CLINICAL DATA:  Shortness of breath. Syncopal episode during dialysis rib  wiring chest compressions. Chest pain.  EXAM: NUCLEAR MEDICINE VENTILATION - PERFUSION LUNG SCAN  TECHNIQUE: Ventilation images were obtained in multiple projections using inhaled aerosol technetium 99 M DTPA. Perfusion images were obtained in multiple projections after intravenous injection of Tc-4684m MAA.  RADIOPHARMACEUTICALS:  40.0 mCi Tc-3584m DTPA aerosol and 6.0 mCi Tc-4884m MAA  COMPARISON:  Two-view chest x-ray 09/12/2014.  FINDINGS: Ventilation: There is some clumping of the radiotracer about the left hilum. This is likely artifactual. No defects are evident.  Perfusion: No wedge shaped peripheral perfusion defects to suggest acute pulmonary embolism.  IMPRESSION: No evidence for pulmonary embolism.   Electronically Signed   By: Gennette Pachris  Mattern M.D.   On: 09/13/2014 10:38   Dg Chest Port 1 View  09/06/2014   CLINICAL DATA:  Status post diatek catheter placement.  EXAM: PORTABLE CHEST - 1 VIEW  COMPARISON:  09/04/2014  FINDINGS: The right IJ diatek catheter is in good position. The distal tip is at the cavoatrial junction and the proximal tip is in the distal SVC. No pneumothorax or hematoma. The lungs show improved aeration when compared to prior chest film. There is persistent perihilar pulmonary edema and vascular congestion. No definite pleural effusions.  IMPRESSION: Right IJ dialysis catheter in good position without complicating features.  Improved aeration since prior study.   Electronically Signed   By: Loralie Champagne M.D.   On: 09/06/2014 13:21   Dg Chest Port 1 View  09/04/2014   CLINICAL DATA:  81 year old female with shortness of Breath. Initial encounter.  EXAM: PORTABLE CHEST - 1 VIEW  COMPARISON:  09/03/2014 and earlier.  FINDINGS: Portable AP semi upright view at 0702 hrs. Increased basilar predominant interstitial opacity and obscuration of the diaphragm. Increased veiling opacity at both lung bases. Stable cardiac size and mediastinal contours. No pneumothorax. Visualized tracheal air  column is within normal limits.  IMPRESSION: Progressed pulmonary edema, bilateral pleural effusions, and bibasilar atelectasis.   Electronically Signed   By: Augusto Gamble M.D.   On: 09/04/2014 07:21   Dg Chest Port 1 View  09/01/2014   CLINICAL DATA:  Shortness of breath. Stage IV renal disease due to diabetes.  EXAM: PORTABLE CHEST - 1 VIEW  COMPARISON:  08/31/2014  FINDINGS: Normal heart size. Pulmonary vascularity is increasing since prior study with development of slight interstitial pattern in the lung bases. This suggest developing edema. Small left pleural effusion is also developing. Calcification of aorta. No pneumothorax. Mediastinal contours are intact.  IMPRESSION: Developing pulmonary vascular congestion edema with small left pleural effusion.   Electronically Signed   By: Burman Nieves M.D.   On: 09/01/2014 22:27   Dg Fluoro Guide Cv Line-no Report  09/06/2014   CLINICAL DATA:    FLOURO GUIDE CV LINE  Fluoroscopy was utilized by the requesting physician.  No radiographic  interpretation.     ASSESSMENT AND PLAN 1. Presumed cardiac arrest 2. ESRD on HD 3. LV dysfunction Rec: most likely scenario to explain her events is that she had VT and was pulseless followed by CPR and spontaneous return to NSR. She did not have a documented arrhythmia and was not shocked. I agree with cardiac cath in the setting of LV dysfunction and arrest. Additional rec's will follow. She is not a great candidate for ICD implant with indwelling HD catheter. I would suggest a Life vest until she is able to receive a fistula. EP study would be helpful if positive for inducible VT although patient's with a non-ischemic CM often due not have inducible VT. A negative EP study would not clear her of the possibility of a recurrent episode of VT.   Ann-Marie Kluge,M.D.  09/15/2014 12:00 PM

## 2014-09-15 NOTE — Progress Notes (Signed)
Subjective:   Feeling well, thought she was going home last night but now she is staying for cath tomorrow. No complaints  Objective Filed Vitals:   09/14/14 1540 09/14/14 1739 09/14/14 2132 09/15/14 0450  BP: 137/57 162/57 152/60 157/63  Pulse: 71 68 70 61  Temp: 98.7 F (37.1 C) 98.3 F (36.8 C) 98.3 F (36.8 C) 99.3 F (37.4 C)  TempSrc: Oral Oral Oral Oral  Resp: Weight:   56.518 kg (124 lb 9.6 oz)   SpO2: 100% 99% 98% 98%   Physical Exam General: alert and oriented. No acute distress  Heart: RRR Lungs: CTA, unlabored.  Abdomen: soft, nontender +BS Extremities: no edema  Dialysis Access:  R IJ cath. L AVF +b.t  Dialysis Orders: [facility] -TTS 3.5 hr 180 new start from [facility] 400/800 had first tmt there 2/2 3.5 hr EDW 59 - left at 58.7 3 K 2.25 Ca left AVF, right IJ hearpin 300 Aranesp 40 start 2/4 venofer 100 x 8  Assessment/Plan: 1. Cardiorespiratory arrest - 5 min into HD 2/4, EKG no acute change, initial troponin 0.03. ECHO- EF 35%. ddimer elevated- no evidence of PE or DVT. for cath monday 2. ESRD - TTS - HD yesterday- K+3.5 was d/c on a 3 K bath - may be able to change to 2 K bath at discharge- likely eating differently at home. 3. Hypertension/volume - 157/63 under edw- titrate gently; CXR NAD 4. Anemia - Hgb 9.5- transfused 1 u 2/ 6 continue ESA  and Fe- she had 1/2 + FOBT last admission and was transfused.2 units 1/27- check stool cards again.  5. Metabolic bone disease - Ca+ 8.2/9.4 corrected. iPTH 170 no vit D yet 6. Nutrition - renal diet Alb 2.5 7. ^ LFTs -  - improved   Jetty Duhamel, NP [facility] Beeper [phone] 09/15/2014,9:48 AM  LOS: 3 days   Renal Attending: Cardiology evaluation in progress to further evaluate event at dialysis.  She looks and feels well.  Next HD on Thursday.  No issues with dialysis on Saturday. Jahred Tatar C   Additional Objective Labs: Basic Metabolic Panel:  Recent Labs Lab  09/13/14 0519 09/14/14 0500 09/15/14 0622  NA 140 137 136  K 3.6 4.2 3.5  CL 102 101 97  CO2 GLUCOSE 70 95 109*  BUN CREATININE 3.93* 6.84* 4.38*  CALCIUM 7.8* 8.1* 8.2*   Liver Function Tests:  Recent Labs Lab 09/12/14 1253 09/13/14 0519 09/14/14 0500  AST 265* 129* 64*  ALT 51* 31 20  ALKPHOS 73 67 59  BILITOT 0.3 0.2* 0.4  PROT 6.0 5.9* 5.8*  ALBUMIN 2.9* 2.6* 2.5*   No results for input(s): LIPASE, AMYLASE in the last 168 hours. CBC:  Recent Labs Lab 09/12/14 1253  09/13/14 0519 09/14/14 0500 09/15/14 0622  WBC 14.8*  --  7.9 8.4 8.5  HGB 8.2*  --  7.7* 7.2* 9.5*  HCT 26.6*  --  24.6* 22.7* 30.4*  MCV 88.4  --  86.0 87.0 88.1  PLT 49*  < > 71* 77* 79*  < > = values in this interval not displayed. Blood Culture    Component Value Date/Time   SDES URINE, RANDOM 09/01/2014 1230   SPECREQUEST NONE 09/01/2014 1230   REPTSTATUS 09/03/2014 FINAL 09/01/2014 1230    Cardiac Enzymes:  Recent Labs Lab 09/13/14 1047  TROPONINI 0.06*   CBG:  Recent Labs Lab 09/14/14 1758 09/14/14 1825 09/14/14 2125  09/15/14 0439 09/15/14 0748  GLUCAP 51* 104* 122* 95 114*   Iron Studies: No results for input(s): IRON, TIBC, TRANSFERRIN, FERRITIN in the last 72 hours. @lablastinr3 @ Studies/Results: Nm Pulmonary Perf And Vent  09/13/2014   CLINICAL DATA:  Shortness of breath. Syncopal episode during dialysis rib wiring chest compressions. Chest pain.  EXAM: NUCLEAR MEDICINE VENTILATION - PERFUSION LUNG SCAN  TECHNIQUE: Ventilation images were obtained in multiple projections using inhaled aerosol technetium 99 M DTPA. Perfusion images were obtained in multiple projections after intravenous injection of Tc-1855m MAA.  RADIOPHARMACEUTICALS:  40.0 mCi Tc-4455m DTPA aerosol and 6.0 mCi Tc-7055m MAA  COMPARISON:  Two-view chest x-ray 09/12/2014.  FINDINGS: Ventilation: There is some clumping of the radiotracer about the left hilum. This is likely artifactual. No  defects are evident.  Perfusion: No wedge shaped peripheral perfusion defects to suggest acute pulmonary embolism.  IMPRESSION: No evidence for pulmonary embolism.   Electronically Signed   By: Gennette Pachris  Mattern M.D.   On: 09/13/2014 10:38   Medications:   . [START ON 09/21/2014] darbepoetin (ARANESP) injection - DIALYSIS  100 mcg Intravenous Q Sat-HD  . ferric gluconate (FERRLECIT/NULECIT) IV  125 mg Intravenous Q T,Th,Sa-HD  . insulin aspart  0-9 Units Subcutaneous TID WC  . sodium chloride  3 mL Intravenous Q12H

## 2014-09-16 DIAGNOSIS — I420 Dilated cardiomyopathy: Secondary | ICD-10-CM | POA: Diagnosis present

## 2014-09-16 LAB — CBC
HCT: 29.7 % — ABNORMAL LOW (ref 36.0–46.0)
Hemoglobin: 9.3 g/dL — ABNORMAL LOW (ref 12.0–15.0)
MCH: 27.6 pg (ref 26.0–34.0)
MCHC: 31.3 g/dL (ref 30.0–36.0)
MCV: 88.1 fL (ref 78.0–100.0)
PLATELETS: 96 10*3/uL — AB (ref 150–400)
RBC: 3.37 MIL/uL — AB (ref 3.87–5.11)
RDW: 17.4 % — ABNORMAL HIGH (ref 11.5–15.5)
WBC: 8.5 10*3/uL (ref 4.0–10.5)

## 2014-09-16 LAB — HEMOGLOBIN A1C
Hgb A1c MFr Bld: 5 % (ref 4.8–5.6)
Mean Plasma Glucose: 97 mg/dL

## 2014-09-16 LAB — BASIC METABOLIC PANEL
ANION GAP: 12 (ref 5–15)
BUN: 15 mg/dL (ref 6–23)
CO2: 26 mmol/L (ref 19–32)
Calcium: 8.4 mg/dL (ref 8.4–10.5)
Chloride: 98 mmol/L (ref 96–112)
Creatinine, Ser: 7 mg/dL — ABNORMAL HIGH (ref 0.50–1.10)
GFR calc Af Amer: 6 mL/min — ABNORMAL LOW (ref >90)
GFR calc non Af Amer: 5 mL/min — ABNORMAL LOW (ref >90)
Glucose, Bld: 98 mg/dL (ref 70–99)
Potassium: 4.1 mmol/L (ref 3.5–5.1)
Sodium: 136 mmol/L (ref 135–145)

## 2014-09-16 LAB — GLUCOSE, CAPILLARY: GLUCOSE-CAPILLARY: 92 mg/dL (ref 70–99)

## 2014-09-16 MED ORDER — SODIUM CHLORIDE 0.9 % IJ SOLN
3.0000 mL | Freq: Two times a day (BID) | INTRAMUSCULAR | Status: DC
  Administered 2014-09-16 (×2): 3 mL via INTRAVENOUS

## 2014-09-16 MED ORDER — SODIUM CHLORIDE 0.9 % IV SOLN: 250.0000 mL | INTRAVENOUS | Status: DC | PRN

## 2014-09-16 MED ORDER — NEPRO/CARBSTEADY PO LIQD
237.0000 mL | Freq: Two times a day (BID) | ORAL | Status: DC
  Administered 2014-09-16 – 2014-09-18 (×5): 237 mL via ORAL

## 2014-09-16 MED ORDER — RENA-VITE PO TABS
1.0000 | ORAL_TABLET | Freq: Every day | ORAL | Status: DC
  Administered 2014-09-16 – 2014-09-17 (×2): 1 via ORAL
  Filled 2014-09-16 (×4): qty 1

## 2014-09-16 MED ORDER — ASPIRIN 81 MG PO CHEW
81.0000 mg | CHEWABLE_TABLET | ORAL | Status: AC
  Administered 2014-09-16: 81 mg via ORAL
  Filled 2014-09-16: qty 1

## 2014-09-16 MED ORDER — CARVEDILOL 3.125 MG PO TABS
3.1250 mg | ORAL_TABLET | Freq: Two times a day (BID) | ORAL | Status: DC
  Administered 2014-09-16: 3.125 mg via ORAL
  Filled 2014-09-16 (×4): qty 1

## 2014-09-16 MED ORDER — SODIUM CHLORIDE 0.9 % IJ SOLN: 3.0000 mL | INTRAMUSCULAR | Status: DC | PRN

## 2014-09-16 NOTE — Progress Notes (Addendum)
[facility] PROGRESS NOTE  [place] ReaderGeorgianna Nesheiwat AVW:[id number] DOB: 1930-06-15 DOA: 09/12/2014 PCP: [name], [name], MD   Brief narrative: 81 year old upcoming female with past medical history of hypertension, diabetes, and recently diagnosed ESRD just started on HD. She was just diagnosed ESRD in January and underwent left-sided AV fistula placement by vascular surgery on 09/06/2014. She had one dialysis as inpatient before her discharge.   Post discharge, patient has been feeling very well. She denies any recent CP, SOB, fever, chill or cough. Active. Able to go to store without any problems.Denies any h/o known heart disease. Echo 09/03/14 EF normal no regional WMA. Moderate to severe MR.   She started on her new medication of labetalol 100 mg twice a day. This morning, patient woke up feeling fine. She took public transportation to her dialysis center. 5 minutes into hemodialysis, she yelled to nursing staff that she had significant shortness breath before becoming unresponsive. CPR was initiated after they failed to find her pulse. It it unknown how long her CPR was performed. She eventually had spontaneous return of pulse. [facility] was [facility].    HPI/Subjective: Denies CP. No dyspnea   Assessment/Plan: Cardiopulmonary arrest Reportedly only 5 minutes after started on dialysis without evidence of hypotension Dr. Ladona Ridgelaylor 2/7 thought " most likely scenario to explain her events is that she had VT and was pulseless followed by CPR and spontaneous return to NSR. She did not have a documented arrhythmia and was not shocked". Cardiac cath in the setting of LV dysfunction recommended- also recommended to have a life vest VQ scan negative for PE, venous Doppler There is no DVT  Troponin mildly elevated. EF 35% with diffuse hypokensis by echo 09/13/14  Anemia due to ESRD Hgb 7.2 up to 9.3 post transfusion- transfused 1 u 2/ 6 continue ESA and Fe -  FOBT negative but was positive a few wks ago-  FOBT last admission and was transfused.2 units 1/27-   ESRD Hemodialysis Tuesday Thursday Saturday  Thrombocytopenia, improving Unclear etiology, no evidence of bleeding, platelets were normal on 09/07/14. abrupt drop in platelets upon admission - HIT pending - holding heparin; 200s at last d/c dropped to 49 at the time of admission and now trending up.96k 2/8   Elevated LFTs, shock liver? AST/L ALT are increased, unclear etiology. Thought initially to be secondary to hypotension. Improving  Diabetes mellitus type 2 Hemoglobin A1c was 4.7 on 08/29/14 indicating tight glycemic control. Started on sensitive SSI and carb modified diet.hypoglycemic 2/6,now stable  Amaryl on hold with controlled sugars   Code Status: full Family Communication: Disposition Plan:  Cath on 2/9 per cardiology   Consultants:  Cardiology  Nephrology   Antibiotics: Anti-infectives    None      Objective: Filed Vitals:   09/15/14 1042 09/15/14 1803 09/15/14 2100 09/16/14 0815  BP: 149/62 152/70 154/51 170/65  Pulse: 61 64 60 64  Temp: 99.1 F (37.3 C) 98.4 F (36.9 C) 98.5 F (36.9 C) 99.3 F (37.4 C)  TempSrc: Oral  Oral Oral  Resp: 16 16 16 18   Height:  5\' 5"  (1.651 m)    Weight:      SpO2: 99% 98% 98% 98%    Intake/Output Summary (Last 24 hours) at 09/16/14 1323 Last data filed at 09/16/14 1100  Gross per 24 hour  Intake    240 ml  Output      0 ml  Net    240 ml  Exam:  General appearance: alert, cooperative and no distress Neck: no carotid bruit and no JVD Lungs: clear to auscultation bilaterally Heart: regular rate and rhythm, S1, S2 normal, no murmur, click, rub or gallop Extremities: no LEE Pulses: 2+ and symmetric  Data Reviewed: Basic Metabolic Panel:  Recent Labs Lab 09/12/14 1253 09/13/14 0519 09/14/14 0500 09/15/14 0622 09/16/14 0749  NA 137 140 137 136 136  K 4.5 3.6 4.2 3.5 4.1  CL 101 102 101 97 98   CO2 24 30 28 30 26   GLUCOSE 112* 70 95 109* 98  BUN 20 8 11 8 15   CREATININE 8.08* 3.93* 6.84* 4.38* 7.00*  CALCIUM 8.2* 7.8* 8.1* 8.2* 8.4    Liver Function Tests:  Recent Labs Lab 09/12/14 1253 09/13/14 0519 09/14/14 0500  AST 265* 129* 64*  ALT 51* 31 20  ALKPHOS 73 67 59  BILITOT 0.3 0.2* 0.4  PROT 6.0 5.9* 5.8*  ALBUMIN 2.9* 2.6* 2.5*   No results for input(s): LIPASE, AMYLASE in the last 168 hours. No results for input(s): AMMONIA in the last 168 hours.  CBC:  Recent Labs Lab 09/12/14 1253 09/12/14 1730 09/13/14 0519 09/14/14 0500 09/15/14 0622 09/16/14 0749  WBC 14.8*  --  7.9 8.4 8.5 8.5  HGB 8.2*  --  7.7* 7.2* 9.5* 9.3*  HCT 26.6*  --  24.6* 22.7* 30.4* 29.7*  MCV 88.4  --  86.0 87.0 88.1 88.1  PLT 49* 74* 71* 77* 79* 96*    Cardiac Enzymes:  Recent Labs Lab 09/13/14 1047  TROPONINI 0.06*   BNP (last 3 results) No results for input(s): BNP in the last 8760 hours.  ProBNP (last 3 results) No results for input(s): PROBNP in the last 8760 hours.    CBG:  Recent Labs Lab 09/15/14 0748 09/15/14 1255 09/15/14 1621 09/15/14 2136 09/16/14 0150  GLUCAP 114* 105* 156* 82 92    No results found for this or any previous visit (from the past 240 hour(s)).   Studies: Dg Chest 2 View  09/12/2014   CLINICAL DATA:  Syncope.  EXAM: CHEST  2 VIEW  COMPARISON:  September 06, 2014.  FINDINGS: The heart size and mediastinal contours are within normal limits. Both lungs are clear. No pneumothorax or pleural effusion is noted. Right internal jugular dialysis catheter is again noted with distal tip in expected position of the SVC. The visualized skeletal structures are unremarkable.  IMPRESSION: No acute cardiopulmonary abnormality seen.   Electronically Signed   By: Roque Lias M.D.   On: 09/12/2014 14:08   Dg Chest 2 View  09/03/2014   CLINICAL DATA:  81 year old female with shortness breath for 2-3 days.  EXAM: CHEST  2 VIEW  COMPARISON:  Chest x-ray  09/01/2014.  FINDINGS: There is cephalization of the pulmonary vasculature and slight indistinctness of the interstitial markings suggestive of mild pulmonary edema. Small left pleural effusion. Opacity in the medial aspect of the left lung base may reflect atelectasis and/or consolidation. Heart size appears normal. Upper mediastinal contours are within normal limits. Atherosclerosis in the thoracic aorta.  IMPRESSION: 1. There appears to be mild interstitial pulmonary edema. Given the normal heart size, clinical evaluation for cause of noncardiogenic pulmonary edema suggested. 2. Small left pleural effusion. 3. Developing opacity in the medial aspect of the left lung base may reflect underlying atelectasis, or airspace consolidation from infection or aspiration. 4. Atherosclerosis.   Electronically Signed   By: Trudie Reed M.D.   On: 09/03/2014 13:33  Dg Chest 2 View  08/31/2014   CLINICAL DATA:  Weakness for 2 days  EXAM: CHEST  2 VIEW  COMPARISON:  None.  FINDINGS: Cardiac shadow is within normal limits. The lungs are well aerated bilaterally. Very minimal atelectatic changes are noted in the posterior costophrenic angle on the lateral projection likely in the posterior right lower lobe.  IMPRESSION: Minimal right lower lobe atelectasis.   Electronically Signed   By: Alcide Clever M.D.   On: 08/31/2014 07:13   US Renal  08/31/2014   CLINICAL DATA:  Acute kidney injury. Chronic kidney disease stage 2. Diabetes and hypertension  EXAM: RENAL/URINARY TRACT ULTRASOUND COMPLETE  COMPARISON:  None.  FINDINGS: Right Kidney:  Length: 7 cm. Increased parenchymal echogenicity. Inferior pole cyst measures 6 mm. No mass or hydronephrosis noted.  Left Kidney:  Length: 9.1 cm. Increased parenchymal echogenicity. Inferior pole cyst measures 3.1 x 3.1 x 3.0 cm. No mass or hydronephrosis.  Bladder:  Collapsed around a Foley catheter.  Other:  Bilateral pleural effusions noted.  IMPRESSION: 1. No obstructive uropathy.  2. Bilateral renal atrophy and increased parenchymal echogenicity compatible with chronic medical renal disease.   Electronically Signed   By: Signa Kell M.D.   On: 08/31/2014 12:57   Nm Pulmonary Perf And Vent  09/13/2014   CLINICAL DATA:  Shortness of breath. Syncopal episode during dialysis rib wiring chest compressions. Chest pain.  EXAM: NUCLEAR MEDICINE VENTILATION - PERFUSION LUNG SCAN  TECHNIQUE: Ventilation images were obtained in multiple projections using inhaled aerosol technetium 99 M DTPA. Perfusion images were obtained in multiple projections after intravenous injection of Tc-80m MAA.  RADIOPHARMACEUTICALS:  40.0 mCi Tc-27m DTPA aerosol and 6.0 mCi Tc-65m MAA  COMPARISON:  Two-view chest x-ray 09/12/2014.  FINDINGS: Ventilation: There is some clumping of the radiotracer about the left hilum. This is likely artifactual. No defects are evident.  Perfusion: No wedge shaped peripheral perfusion defects to suggest acute pulmonary embolism.  IMPRESSION: No evidence for pulmonary embolism.   Electronically Signed   By: Gennette Pac M.D.   On: 09/13/2014 10:38   Dg Chest Port 1 View  09/06/2014   CLINICAL DATA:  Status post diatek catheter placement.  EXAM: PORTABLE CHEST - 1 VIEW  COMPARISON:  09/04/2014  FINDINGS: The right IJ diatek catheter is in good position. The distal tip is at the cavoatrial junction and the proximal tip is in the distal SVC. No pneumothorax or hematoma. The lungs show improved aeration when compared to prior chest film. There is persistent perihilar pulmonary edema and vascular congestion. No definite pleural effusions.  IMPRESSION: Right IJ dialysis catheter in good position without complicating features.  Improved aeration since prior study.   Electronically Signed   By: Loralie Champagne M.D.   On: 09/06/2014 13:21   Dg Chest Port 1 View  09/04/2014   CLINICAL DATA:  81 year old female with shortness of Breath. Initial encounter.  EXAM: PORTABLE CHEST - 1 VIEW   COMPARISON:  09/03/2014 and earlier.  FINDINGS: Portable AP semi upright view at 0702 hrs. Increased basilar predominant interstitial opacity and obscuration of the diaphragm. Increased veiling opacity at both lung bases. Stable cardiac size and mediastinal contours. No pneumothorax. Visualized tracheal air column is within normal limits.  IMPRESSION: Progressed pulmonary edema, bilateral pleural effusions, and bibasilar atelectasis.   Electronically Signed   By: Augusto Gamble M.D.   On: 09/04/2014 07:21   Dg Chest Port 1 View  09/01/2014   CLINICAL DATA:  Shortness of breath.  Stage IV renal disease due to diabetes.  EXAM: PORTABLE CHEST - 1 VIEW  COMPARISON:  08/31/2014  FINDINGS: Normal heart size. Pulmonary vascularity is increasing since prior study with development of slight interstitial pattern in the lung bases. This suggest developing edema. Small left pleural effusion is also developing. Calcification of aorta. No pneumothorax. Mediastinal contours are intact.  IMPRESSION: Developing pulmonary vascular congestion edema with small left pleural effusion.   Electronically Signed   By: Burman Nieves M.D.   On: 09/01/2014 22:27   Dg Fluoro Guide Cv Line-no Report  09/06/2014   CLINICAL DATA:    FLOURO GUIDE CV LINE  Fluoroscopy was utilized by the requesting physician.  No radiographic  interpretation.     Scheduled Meds: . [START ON 09/21/2014] darbepoetin (ARANESP) injection - DIALYSIS  100 mcg Intravenous Q Sat-HD  . feeding supplement (NEPRO CARB STEADY)  237 mL Oral BID BM  . ferric gluconate (FERRLECIT/NULECIT) IV  125 mg Intravenous Q T,Th,Sa-HD  . insulin aspart  0-9 Units Subcutaneous TID WC  . multivitamin  1 tablet Oral QHS  . sodium chloride  3 mL Intravenous Q12H  . sodium chloride  3 mL Intravenous Q12H   Continuous Infusions:    Time spent: 30 minutes   Dorothee Napierkowski  [facility] Pager -  [email or web address], password [facility] 09/16/2014, 1:23 PM  LOS: 4 days

## 2014-09-16 NOTE — Progress Notes (Signed)
Patient Profile: 81-year-old upcoming female with past medical history of hypertension, diabetes, and recently diagnosed ESRD just started on HD came in after cardiac arrest 5 min into outpt dialysis.   Subjective: Denies CP. No dyspnea.   Objective: Vital signs in last 24 hours: Temp:  [98.4 F (36.9 C)-99.3 F (37.4 C)] 99.3 F (37.4 C) (02/08 0815) Pulse Rate:  [60-64] 64 (02/08 0815) Resp:  [16-18] 18 (02/08 0815) BP: (152-170)/(51-70) 170/65 mmHg (02/08 0815) SpO2:  [98 %] 98 % (02/08 0815) Last BM Date: 09/15/14  Intake/Output from previous day: 02/07 0701 - 02/08 0700 In: 240 [P.O.:240] Out: 0  Intake/Output this shift:    Medications Current Facility-Administered Medications  Medication Dose Route Frequency Provider Last Rate Last Dose  . 0.9 %  sodium chloride infusion  250 mL Intravenous PRN Gregg W Taylor, MD      . acetaminophen (TYLENOL) tablet 650 mg  650 mg Oral Q6H PRN Mutaz Elmahi, MD       Or  . acetaminophen (TYLENOL) suppository 650 mg  650 mg Rectal Q6H PRN Mutaz Elmahi, MD      . [START ON 09/21/2014] Darbepoetin Alfa (ARANESP) injection 100 mcg  100 mcg Intravenous Q Sat-HD Bridget Mary Whelan, NP      . feeding supplement (NEPRO CARB STEADY) liquid 237 mL  237 mL Oral BID BM Martha B. Bergman, PA-C      . ferric gluconate (NULECIT) 125 mg in sodium chloride 0.9 % 100 mL IVPB  125 mg Intravenous Q T,Th,Sa-HD Bridget Mary Whelan, NP   125 mg at 09/14/14 0831  . HYDROcodone-acetaminophen (NORCO/VICODIN) 5-325 MG per tablet 1-2 tablet  1-2 tablet Oral Q4H PRN Mutaz Elmahi, MD      . insulin aspart (novoLOG) injection 0-9 Units  0-9 Units Subcutaneous TID WC Mutaz Elmahi, MD   2 Units at 09/15/14 1740  . multivitamin (RENA-VIT) tablet 1 tablet  1 tablet Oral QHS Martha B. Bergman, PA-C      . sodium chloride 0.9 % injection 3 mL  3 mL Intravenous Q12H Mutaz Elmahi, MD   3 mL at 09/15/14 2142  . sodium chloride 0.9 % injection 3 mL  3 mL Intravenous Q12H  Gregg W Taylor, MD   3 mL at 09/16/14 0622  . sodium chloride 0.9 % injection 3 mL  3 mL Intravenous PRN Gregg W Taylor, MD        PE: General appearance: alert, cooperative and no distress Neck: no carotid bruit and no JVD Lungs: clear to auscultation bilaterally Heart: regular rate and rhythm, S1, S2 normal, no murmur, click, rub or gallop Extremities: no LEE Pulses: 2+ and symmetric Skin: warm and dry Neurologic: Grossly normal  Lab Results:   Recent Labs  09/14/14 0500 09/15/14 0622 09/16/14 0749  WBC 8.4 8.5 8.5  HGB 7.2* 9.5* 9.3*  HCT 22.7* 30.4* 29.7*  PLT 77* 79* 96*   BMET  Recent Labs  09/14/14 0500 09/15/14 0622 09/16/14 0749  NA 137 136 136  K 4.2 3.5 4.1  CL 101 97 98  CO2 28 30 26  GLUCOSE 95 109* 98  BUN 11 8 15  CREATININE 6.84* 4.38* 7.00*  CALCIUM 8.1* 8.2* 8.4   PT/INR No results for input(s): LABPROT, INR in the last 72 hours. Cholesterol No results for input(s): CHOL in the last 72 hours. Cardiac Enzymes Invalid input(s): TROPONIN,  CKMB  Studies/Results: 2D echo 09/13/14 Study Conclusions  - Left ventricle: The cavity size was normal. Wall thickness   was normal. Systolic function was moderately to severely reduced. The estimated ejection fraction was 35%. Diffuse hypokinesis. Doppler parameters are consistent with abnormal left ventricular relaxation (grade 1 diastolic dysfunction). Doppler parameters are consistent with high ventricular filling pressure. - Mitral valve: Calcified annulus. There was mild regurgitation. - Left atrium: The atrium was mildly dilated. - Pulmonary arteries: Systolic pressure was mildly increased. PA peak pressure: 32 mm Hg (S). - Pericardium, extracardiac: A small pericardial effusion was identified.  Impressions:  - Moderate to severe global reduction in LV function; grade 1 diastolic dysfunction; mild LAE; mild MR and TR; mildly elevated pulmonary pressure; small pericardial  effusion.  Assessment/Plan  Principal Problem:   Cardiopulmonary arrest Active Problems:   DM type 2, uncontrolled, with renal complications   Anemia of chronic disease   Syncope   ESRD (end stage renal disease)   Hypotension   Thrombocytopenia   Troponin level elevated-0.2  1. Cardiopulmonary arrest: Pt was seen by Dr. Taylor 2/7. Recs were as follows: "most likely scenario to explain her events is that she had VT and was pulseless followed by CPR and spontaneous return to NSR. She did not have a documented arrhythmia and was not shocked". Cardiac cath in the setting of LV dysfunction recommended. EF 35% with diffuse hypokensis by echo 09/13/14. Will check with cath lab to see if room for today. If not, may have to delay until tomorrow.  LifeVest also recommended until she is able to receive a fistula. We will arrange with [employer] rep. Telemetry shows no recurrent arrhthymias. Recommend low dose Coreg which will also help with her HTN. HR in the mid 60s.   2. ESRD: on HD     LOS: 4 days    Natalie M. Simmons, PA-C 09/16/2014 10:50 AM  I have seen and examined the patient along with Natalie M. Simmons, PA-C.  I have reviewed the chart, notes and new data.  I agree with PA's note.  Key new complaints: symptom free right now Key examination changes: no overt hypervolemia by exam  PLAN: Coronary angio in AM to clarify etiology of moderate cardiomyopathy. Add carvedilol  Kyla Duffy, MD, FACC [facility] [phone] 09/16/2014, 5:46 PM  

## 2014-09-16 NOTE — Care Management Note (Signed)
CARE MANAGEMENT NOTE 09/16/2014  Patient:  Natalie Wolfe, Natalie Wolfe   Account Number:  [id number]  Date Initiated:  09/16/2014  Documentation initiated by:  Denyce Harr  Subjective/Objective Assessment:   CM following for progression and d/c planning.     Action/Plan:   Met with pt who is awaiting cardiac cath.   Anticipated DC Date:  09/18/2014   Anticipated DC Plan:  [place]         Choice offered to / List presented to:             Status of service:  In process, will continue to follow Medicare Important Message given?  YES (If response is "NO", the following Medicare IM given date fields will be blank) Date Medicare IM given:  09/16/2014 Medicare IM given by:  Miquan Tandon Date Additional Medicare IM given:   Additional Medicare IM given by:    Discharge Disposition:    Per UR Regulation:    If discussed at Long Length of Stay Meetings, dates discussed:    Comments:

## 2014-09-16 NOTE — Progress Notes (Signed)
[facility] Progress Note  Assessment/Plan: 1. Cardiorespiratory arrest - 5 min into HD 2/4, EKG no acute change, initial troponin 0.03. ECHO- EF 35%. ddimer elevated- no evidence of PE or DVT. for cath Monday with recommendations per Cardiology 2. ESRD - TTS -  was d/c on a 3 K bath - may be able to change to 2 K bath at discharge- likely eating differently at home; HD orders written in case she is not d/c today 3. Hypertension/volume - 157/63 under edw- titrate gently; CXR NAD; Net UF 1.8 2/4 and 1 L 2/6 - post weght both times 57.2 4. Anemia - Hgb 7/2 up to 9.3 post transfusion- transfused 1 u 2/ 6 continue ESA and Fe- she had 1/2 + FOBT last admission and was transfused.2 units 1/27- check stool cards again 2/7 was neg 5. Metabolic bone disease -ok. iPTH 170 no vit D yet 6. Nutrition - renal diet Alb 2.5 - needs ONSP at d/c; add vitamin and nepro 7. ^ LFTs - - improved 8. Thrombocytopenia - abrupt drop in platelets upon admission - HIT pending - holding heparin; 200s at last d/c dropped to 49 at the time of admission and now trending up.  Sheffield Slider, PA-C  [facility] [phone] 09/16/2014,10:09 AM  LOS: 4 days   Pt seen, examined and agree w A/P as above.  Vinson Moselle MD pager [phone]    cell [phone] 09/16/2014, 12:07 PM    Subjective:   Feels find except chest where she had CPR, waiting for heart cath  Objective Filed Vitals:   09/15/14 1042 09/15/14 1803 09/15/14 2100 09/16/14 0815  BP: 149/62 152/70 154/51 170/65  Pulse: 61 64 60 64  Temp: 99.1 F (37.3 C) 98.4 F (36.9 C) 98.5 F (36.9 C) 99.3 F (37.4 C)  TempSrc: Oral  Oral Oral  Resp: Height:   (1.651 m)    Weight:      SpO2: 99% 98% 98% 98%   Physical Exam General: NAD sitting in side of bed Heart: RRR Lungs: no rales Abdomen: soft Extremities:no LE edema Dialysis Access: right IJ cath and left upper AVF patent  Dialysis Orders: [facility]  -TTS 3.5 hr 180 new start from [facility] 400/800 had first tmt there 2/2 3.5 hr EDW 59 - left at 58.7 3 K 2.25 Ca left AVF, right IJ hearpin 300 Aranesp 40 start 2/4 venofer 100 x 8  Additional Objective Labs: Basic Metabolic Panel:  Recent Labs Lab 09/14/14 0500 09/15/14 0622 09/16/14 0749  NA 137 136 136  K 4.2 3.5 4.1  CL 101 97 98  CO2 GLUCOSE 95 109* 98  BUN CREATININE 6.84* 4.38* 7.00*  CALCIUM 8.1* 8.2* 8.4   Liver Function Tests:  Recent Labs Lab 09/12/14 1253 09/13/14 0519 09/14/14 0500  AST 265* 129* 64*  ALT 51* 31 20  ALKPHOS 73 67 59  BILITOT 0.3 0.2* 0.4  PROT 6.0 5.9* 5.8*  ALBUMIN 2.9* 2.6* 2.5*  CBC:  Recent Labs Lab 09/12/14 1253  09/13/14 0519 09/14/14 0500 09/15/14 0622 09/16/14 0749  WBC 14.8*  --  7.9 8.4 8.5 8.5  HGB 8.2*  --  7.7* 7.2* 9.5* 9.3*  HCT 26.6*  --  24.6* 22.7* 30.4* 29.7*  MCV 88.4  --  86.0 87.0 88.1 88.1  PLT 49*  < > 71* 77* 79* 96*  < > = values in this interval not displayed. Blood  Culture    Component Value Date/Time   SDES URINE, RANDOM 09/01/2014 1230   SPECREQUEST NONE 09/01/2014 1230   REPTSTATUS 09/03/2014 FINAL 09/01/2014 1230    Cardiac Enzymes:  Recent Labs Lab 09/13/14 1047  TROPONINI 0.06*   CBG:  Recent Labs Lab 09/15/14 0748 09/15/14 1255 09/15/14 1621 09/15/14 2136 09/16/14 0150  GLUCAP 114* 105* 156* 82 92  Medications:   . [START ON 09/21/2014] darbepoetin (ARANESP) injection - DIALYSIS  100 mcg Intravenous Q Sat-HD  . ferric gluconate (FERRLECIT/NULECIT) IV  125 mg Intravenous Q T,Th,Sa-HD  . insulin aspart  0-9 Units Subcutaneous TID WC  . sodium chloride  3 mL Intravenous Q12H  . sodium chloride  3 mL Intravenous Q12H

## 2014-09-17 ENCOUNTER — Encounter (HOSPITAL_COMMUNITY)
Admission: EM | Disposition: A | Discharge status: Home / Self Care | Payer: Self-pay | Source: Home / Self Care | Attending: Internal Medicine

## 2014-09-17 ENCOUNTER — Encounter (HOSPITAL_COMMUNITY): Payer: Self-pay | Admitting: Cardiovascular Disease

## 2014-09-17 ENCOUNTER — Telehealth: Payer: Self-pay | Admitting: Cardiovascular Disease

## 2014-09-17 DIAGNOSIS — I2510 Atherosclerotic heart disease of native coronary artery without angina pectoris: Secondary | ICD-10-CM

## 2014-09-17 DIAGNOSIS — IMO0001 Reserved for inherently not codable concepts without codable children: Secondary | ICD-10-CM

## 2014-09-17 DIAGNOSIS — Z0389 Encounter for observation for other suspected diseases and conditions ruled out: Secondary | ICD-10-CM

## 2014-09-17 HISTORY — PX: LEFT HEART CATHETERIZATION WITH CORONARY ANGIOGRAM: SHX5451

## 2014-09-17 LAB — COMPREHENSIVE METABOLIC PANEL
ALBUMIN: 2.5 g/dL — AB (ref 3.5–5.2)
ALT: 9 U/L (ref 0–35)
AST: 17 U/L (ref 0–37)
Alkaline Phosphatase: 67 U/L (ref 39–117)
Anion gap: 13 (ref 5–15)
BUN: 26 mg/dL — ABNORMAL HIGH (ref 6–23)
CO2: 25 mmol/L (ref 19–32)
CREATININE: 8.91 mg/dL — AB (ref 0.50–1.10)
Calcium: 8.3 mg/dL — ABNORMAL LOW (ref 8.4–10.5)
Chloride: 95 mmol/L — ABNORMAL LOW (ref 96–112)
GFR calc non Af Amer: 4 mL/min — ABNORMAL LOW (ref >90)
GFR, EST AFRICAN AMERICAN: 4 mL/min — AB (ref >90)
Glucose, Bld: 97 mg/dL (ref 70–99)
Potassium: 4.1 mmol/L (ref 3.5–5.1)
Sodium: 133 mmol/L — ABNORMAL LOW (ref 135–145)
Total Bilirubin: 0.4 mg/dL (ref 0.3–1.2)
Total Protein: 5.6 g/dL — ABNORMAL LOW (ref 6.0–8.3)

## 2014-09-17 LAB — CBC
HCT: 27.6 % — ABNORMAL LOW (ref 36.0–46.0)
Hemoglobin: 8.8 g/dL — ABNORMAL LOW (ref 12.0–15.0)
MCH: 27.3 pg (ref 26.0–34.0)
MCHC: 31.9 g/dL (ref 30.0–36.0)
MCV: 85.7 fL (ref 78.0–100.0)
Platelets: 121 10*3/uL — ABNORMAL LOW (ref 150–400)
RBC: 3.22 MIL/uL — ABNORMAL LOW (ref 3.87–5.11)
RDW: 17.1 % — AB (ref 11.5–15.5)
WBC: 8.8 10*3/uL (ref 4.0–10.5)

## 2014-09-17 LAB — GLUCOSE, CAPILLARY
GLUCOSE-CAPILLARY: 107 mg/dL — AB (ref 70–99)
GLUCOSE-CAPILLARY: 84 mg/dL (ref 70–99)
Glucose-Capillary: 139 mg/dL — ABNORMAL HIGH (ref 70–99)
Glucose-Capillary: 123 mg/dL — ABNORMAL HIGH (ref 70–99)
Glucose-Capillary: 86 mg/dL (ref 70–99)
Glucose-Capillary: 110 mg/dL — ABNORMAL HIGH (ref 70–99)
Glucose-Capillary: 82 mg/dL (ref 70–99)

## 2014-09-17 LAB — HEPARIN INDUCED THROMBOCYTOPENIA PNL: HEPARIN INDUCED PLT AB: 1.197 {OD_unit} — AB (ref 0.000–0.400)

## 2014-09-17 LAB — SEROTONIN RELEASE ASSAY (SRA)
SRA .2 IU/mL UFH Ser-aCnc: 77 % — ABNORMAL HIGH (ref 0–20)
SRA 100IU/mL UFH Ser-aCnc: 1 % (ref 0–20)

## 2014-09-17 LAB — PROTIME-INR
INR: 1.05 (ref 0.00–1.49)
Prothrombin Time: 13.8 seconds (ref 11.6–15.2)

## 2014-09-17 SURGERY — LEFT HEART CATHETERIZATION WITH CORONARY ANGIOGRAM
Anesthesia: LOCAL

## 2014-09-17 MED ORDER — LIDOCAINE-PRILOCAINE 2.5-2.5 % EX CREA: 1.0000 "application " | TOPICAL_CREAM | CUTANEOUS | Status: DC | PRN

## 2014-09-17 MED ORDER — SODIUM CHLORIDE 0.9 % IJ SOLN: 3.0000 mL | INTRAMUSCULAR | Status: DC | PRN

## 2014-09-17 MED ORDER — FENTANYL CITRATE 0.05 MG/ML IJ SOLN
INTRAMUSCULAR | Status: AC
  Filled 2014-09-17: qty 2

## 2014-09-17 MED ORDER — CARVEDILOL 6.25 MG PO TABS
6.2500 mg | ORAL_TABLET | Freq: Two times a day (BID) | ORAL | Status: DC
  Administered 2014-09-17 – 2014-09-18 (×2): 6.25 mg via ORAL
  Filled 2014-09-17 (×4): qty 1

## 2014-09-17 MED ORDER — ISOSORBIDE MONONITRATE ER 30 MG PO TB24
30.0000 mg | ORAL_TABLET | Freq: Every day | ORAL | Status: DC
  Administered 2014-09-17 – 2014-09-18 (×2): 30 mg via ORAL
  Filled 2014-09-17 (×2): qty 1

## 2014-09-17 MED ORDER — CARVEDILOL 6.25 MG PO TABS: 6.2500 mg | ORAL_TABLET | Freq: Two times a day (BID) | ORAL | Status: DC

## 2014-09-17 MED ORDER — SODIUM CHLORIDE 0.9 % IV SOLN: 250.0000 mL | INTRAVENOUS | Status: DC | PRN

## 2014-09-17 MED ORDER — SODIUM CHLORIDE 0.9 % IJ SOLN: 3.0000 mL | Freq: Two times a day (BID) | INTRAMUSCULAR | Status: DC

## 2014-09-17 MED ORDER — PENTAFLUOROPROP-TETRAFLUOROETH EX AERO: 1.0000 "application " | INHALATION_SPRAY | CUTANEOUS | Status: DC | PRN

## 2014-09-17 MED ORDER — SODIUM CHLORIDE 0.9 % IV SOLN: 100.0000 mL | INTRAVENOUS | Status: DC | PRN

## 2014-09-17 MED ORDER — ACETAMINOPHEN 325 MG PO TABS: 650.0000 mg | ORAL_TABLET | ORAL | Status: DC | PRN

## 2014-09-17 MED ORDER — ONDANSETRON HCL 4 MG/2ML IJ SOLN: 4.0000 mg | Freq: Four times a day (QID) | INTRAMUSCULAR | Status: DC | PRN

## 2014-09-17 MED ORDER — ASPIRIN 81 MG PO TBEC: 81.0000 mg | DELAYED_RELEASE_TABLET | Freq: Every day | ORAL | Status: DC

## 2014-09-17 MED ORDER — ALTEPLASE 2 MG IJ SOLR
2.0000 mg | Freq: Once | INTRAMUSCULAR | Status: DC | PRN
  Filled 2014-09-17: qty 2

## 2014-09-17 MED ORDER — MIDAZOLAM HCL 2 MG/2ML IJ SOLN
INTRAMUSCULAR | Status: AC
  Filled 2014-09-17: qty 2

## 2014-09-17 MED ORDER — HYDRALAZINE HCL 20 MG/ML IJ SOLN
10.0000 mg | Freq: Once | INTRAMUSCULAR | Status: AC
  Administered 2014-09-17: 10 mg via INTRAVENOUS

## 2014-09-17 MED ORDER — SODIUM CHLORIDE 0.9 % IV SOLN: INTRAVENOUS | Status: DC

## 2014-09-17 MED ORDER — ISOSORBIDE MONONITRATE ER 30 MG PO TB24: 30.0000 mg | ORAL_TABLET | Freq: Every day | ORAL | Status: DC

## 2014-09-17 MED ORDER — LIDOCAINE HCL (PF) 1 % IJ SOLN: 5.0000 mL | INTRAMUSCULAR | Status: DC | PRN

## 2014-09-17 MED ORDER — NEPRO/CARBSTEADY PO LIQD: 237.0000 mL | ORAL | Status: DC | PRN

## 2014-09-17 MED ORDER — HYDRALAZINE HCL 25 MG PO TABS: 25.0000 mg | ORAL_TABLET | Freq: Three times a day (TID) | ORAL | Status: DC

## 2014-09-17 MED ORDER — LIDOCAINE-PRILOCAINE 2.5-2.5 % EX CREA
1.0000 | TOPICAL_CREAM | CUTANEOUS | Status: DC | PRN
Start: 2014-09-17 — End: 2014-09-17

## 2014-09-17 MED ORDER — ASPIRIN 81 MG PO CHEW: 81.0000 mg | CHEWABLE_TABLET | ORAL | Status: DC

## 2014-09-17 MED ORDER — HYDRALAZINE HCL 20 MG/ML IJ SOLN
INTRAMUSCULAR | Status: AC
  Filled 2014-09-17: qty 1

## 2014-09-17 MED ORDER — HYDRALAZINE HCL 10 MG PO TABS
10.0000 mg | ORAL_TABLET | Freq: Three times a day (TID) | ORAL | Status: DC
  Administered 2014-09-17 – 2014-09-18 (×4): 10 mg via ORAL
  Filled 2014-09-17 (×6): qty 1

## 2014-09-17 MED ORDER — ASPIRIN EC 81 MG PO TBEC
81.0000 mg | DELAYED_RELEASE_TABLET | Freq: Every day | ORAL | Status: DC
  Administered 2014-09-17 – 2014-09-18 (×2): 81 mg via ORAL
  Filled 2014-09-17 (×2): qty 1

## 2014-09-17 MED ORDER — HYDRALAZINE HCL 20 MG/ML IJ SOLN
10.0000 mg | Freq: Once | INTRAMUSCULAR | Status: AC
  Administered 2014-09-17: 09:00:00 via INTRAVENOUS

## 2014-09-17 MED ORDER — ALTEPLASE 2 MG IJ SOLR: 2.0000 mg | Freq: Once | INTRAMUSCULAR | Status: DC | PRN

## 2014-09-17 MED ORDER — HEPARIN SODIUM (PORCINE) 1000 UNIT/ML DIALYSIS
20.0000 [IU]/kg | INTRAMUSCULAR | Status: DC | PRN
  Filled 2014-09-17: qty 2

## 2014-09-17 MED ORDER — LIDOCAINE HCL (PF) 1 % IJ SOLN
INTRAMUSCULAR | Status: AC
  Filled 2014-09-17: qty 30

## 2014-09-17 MED ORDER — HEPARIN (PORCINE) IN NACL 2-0.9 UNIT/ML-% IJ SOLN
INTRAMUSCULAR | Status: AC
  Filled 2014-09-17: qty 1000

## 2014-09-17 NOTE — H&P (View-Only) (Signed)
Patient Profile: 81 year old upcoming female with past medical history of hypertension, diabetes, and recently diagnosed ESRD just started on HD came in after cardiac arrest 5 min into outpt dialysis.   Subjective: Denies CP. No dyspnea.   Objective: Vital signs in last 24 hours: Temp:  [98.4 F (36.9 C)-99.3 F (37.4 C)] 99.3 F (37.4 C) (02/08 0815) Pulse Rate:  [60-64] 64 (02/08 0815) Resp:  [16-18] 18 (02/08 0815) BP: (152-170)/(51-70) 170/65 mmHg (02/08 0815) SpO2:  [98 %] 98 % (02/08 0815) Last BM Date: 09/15/14  Intake/Output from previous day: 02/07 0701 - 02/08 0700 In: 240 [P.O.:240] Out: 0  Intake/Output this shift:    Medications Current Facility-Administered Medications  Medication Dose Route Frequency Provider Last Rate Last Dose  . 0.9 %  sodium chloride infusion  250 mL Intravenous PRN Marinus Maw, MD      . acetaminophen (TYLENOL) tablet 650 mg  650 mg Oral Q6H PRN Clydia Llano, MD       Or  . acetaminophen (TYLENOL) suppository 650 mg  650 mg Rectal Q6H PRN Clydia Llano, MD      . Melene Muller ON 09/21/2014] Darbepoetin Alfa (ARANESP) injection 100 mcg  100 mcg Intravenous Q Sat-HD Derrill Kay, NP      . feeding supplement (NEPRO CARB STEADY) liquid 237 mL  237 mL Oral BID BM Sheffield Slider, PA-C      . ferric gluconate (NULECIT) 125 mg in sodium chloride 0.9 % 100 mL IVPB  125 mg Intravenous Q T,Th,Sa-HD Derrill Kay, NP   125 mg at 09/14/14 0831  . HYDROcodone-acetaminophen (NORCO/VICODIN) 5-325 MG per tablet 1-2 tablet  1-2 tablet Oral Q4H PRN Clydia Llano, MD      . insulin aspart (novoLOG) injection 0-9 Units  0-9 Units Subcutaneous TID WC Clydia Llano, MD   2 Units at 09/15/14 1740  . multivitamin (RENA-VIT) tablet 1 tablet  1 tablet Oral QHS Sheffield Slider, PA-C      . sodium chloride 0.9 % injection 3 mL  3 mL Intravenous Q12H Clydia Llano, MD   3 mL at 09/15/14 2142  . sodium chloride 0.9 % injection 3 mL  3 mL Intravenous Q12H  Marinus Maw, MD   3 mL at 09/16/14 0622  . sodium chloride 0.9 % injection 3 mL  3 mL Intravenous PRN Marinus Maw, MD        PE: General appearance: alert, cooperative and no distress Neck: no carotid bruit and no JVD Lungs: clear to auscultation bilaterally Heart: regular rate and rhythm, S1, S2 normal, no murmur, click, rub or gallop Extremities: no LEE Pulses: 2+ and symmetric Skin: warm and dry Neurologic: Grossly normal  Lab Results:   Recent Labs  09/14/14 0500 09/15/14 0622 09/16/14 0749  WBC 8.4 8.5 8.5  HGB 7.2* 9.5* 9.3*  HCT 22.7* 30.4* 29.7*  PLT 77* 79* 96*   BMET  Recent Labs  09/14/14 0500 09/15/14 0622 09/16/14 0749  NA 137 136 136  K 4.2 3.5 4.1  CL 101 97 98  CO2 GLUCOSE 95 109* 98  BUN CREATININE 6.84* 4.38* 7.00*  CALCIUM 8.1* 8.2* 8.4   PT/INR No results for input(s): LABPROT, INR in the last 72 hours. Cholesterol No results for input(s): CHOL in the last 72 hours. Cardiac Enzymes Invalid input(s): TROPONIN,  CKMB  Studies/Results: 2D echo 09/13/14 Study Conclusions  - Left ventricle: The cavity size was normal. Wall thickness  was normal. Systolic function was moderately to severely reduced. The estimated ejection fraction was 35%. Diffuse hypokinesis. Doppler parameters are consistent with abnormal left ventricular relaxation (grade 1 diastolic dysfunction). Doppler parameters are consistent with high ventricular filling pressure. - Mitral valve: Calcified annulus. There was mild regurgitation. - Left atrium: The atrium was mildly dilated. - Pulmonary arteries: Systolic pressure was mildly increased. PA peak pressure: 32 mm Hg (S). - Pericardium, extracardiac: A small pericardial effusion was identified.  Impressions:  - Moderate to severe global reduction in LV function; grade 1 diastolic dysfunction; mild LAE; mild MR and TR; mildly elevated pulmonary pressure; small pericardial  effusion.  Assessment/Plan  Principal Problem:   Cardiopulmonary arrest Active Problems:   DM type 2, uncontrolled, with renal complications   Anemia of chronic disease   Syncope   ESRD (end stage renal disease)   Hypotension   Thrombocytopenia   Troponin level elevated-0.2  1. Cardiopulmonary arrest: Pt was seen by Dr. Ladona Ridgelaylor 2/7. Recs were as follows: "most likely scenario to explain her events is that she had VT and was pulseless followed by CPR and spontaneous return to NSR. She did not have a documented arrhythmia and was not shocked". Cardiac cath in the setting of LV dysfunction recommended. EF 35% with diffuse hypokensis by echo 09/13/14. Will check with cath lab to see if room for today. If not, may have to delay until tomorrow.  LifeVest also recommended until she is able to receive a fistula. We will arrange with [employer] rep. Telemetry shows no recurrent arrhthymias. Recommend low dose Coreg which will also help with her HTN. HR in the mid 60s.   2. ESRD: on HD     LOS: 4 days    Brittainy M. Sharol HarnessSimmons, PA-C 09/16/2014 10:50 AM  I have seen and examined the patient along with Brittainy M. Sharol HarnessSimmons, PA-C.  I have reviewed the chart, notes and new data.  I agree with PA's note.  Key new complaints: symptom free right now Key examination changes: no overt hypervolemia by exam  PLAN: Coronary angio in AM to clarify etiology of moderate cardiomyopathy. Add carvedilol  Thurmon FairMihai Kabria Hetzer, MD, [facility]State [facility] HospitalFACC [facility] [phone] 09/16/2014, 5:46 PM

## 2014-09-17 NOTE — Discharge Summary (Addendum)
Physician Discharge Summary  Natalie Wolfe MRN: [id number] DOB/AGE: [birth year] 81 y.o.  PCP: Estill Dooms, MD   Admit date: 09/12/2014 Discharge date: 09/17/2014  Discharge Diagnoses:  :   Cardiopulmonary arrest Active Problems:   DM type 2, uncontrolled, with renal complications   Essential hypertension   Anemia of chronic disease   Syncope   ESRD (end stage renal disease)   Hypotension on admission   Thrombocytopenia   Troponin level elevated-0.2   Congestive dilated cardiomyopathy-EF 40-45% at cath, 35% by echo   Normal coronary arteries 09/16/2014   Follow-up recommendations 1. functional assay (SRA) to be checked by nephrology for HIT , hold heparin until his confirmed   Follow-up CBC, CMP in 3-5 days and with hemodialysis Follow-up with PCP in 5-7 days      Medication List    STOP taking these medications        amLODipine 5 MG tablet  Commonly known as:  NORVASC     labetalol 100 MG tablet  Commonly known as:  NORMODYNE      TAKE these medications        acetaminophen 325 MG tablet  Commonly known as:  TYLENOL  Take 650 mg by mouth daily as needed for mild pain.     aspirin 81 MG EC tablet  Take 1 tablet (81 mg total) by mouth daily.     calcium acetate 667 MG capsule  Commonly known as:  PHOSLO  Take 1 capsule (667 mg total) by mouth 3 (three) times daily with meals.     carvedilol 6.25 MG tablet  Commonly known as:  COREG  Take 1 tablet (6.25 mg total) by mouth 2 (two) times daily with a meal.     feeding supplement (NEPRO CARB STEADY) Liqd  Take 237 mLs by mouth as needed (missed meal during dialysis.).     fluocinonide cream 0.05 %  Commonly known as:  LIDEX  Apply 1 application topically 2 (two) times daily. Apply from neck down twice a day (provided by dermatology).               glucose blood test strip  Commonly known as:  BAYER CONTOUR NEXT TEST  Use as instructed     hydrALAZINE 25 MG tablet  Commonly known as:   APRESOLINE  Take 1 tablet (25 mg total) by mouth every 8 (eight) hours.     hydrocortisone 2.5 % lotion  Apply 1 application topically 2 (two) times daily. Apply to skin and face     isosorbide mononitrate 30 MG 24 hr tablet  Commonly known as:  IMDUR  Take 1 tablet (30 mg total) by mouth daily.     multivitamin Tabs tablet  Take 1 tablet by mouth at bedtime.     VESICARE 5 MG tablet  Generic drug:  solifenacin  Take 5 mg by mouth daily.        Discharge Condition:Stable tion: 06-Home-Health Care Svc   Consults Cardiology Nephrology  cant Diagnostic Studies: Dg Chest 2 View  09/12/2014   CLINICAL DATA:  Syncope.  EXAM: CHEST  2 VIEW  COMPARISON:  September 06, 2014.  FINDINGS: The heart size and mediastinal contours are within normal limits. Both lungs are clear. No pneumothorax or pleural effusion is noted. Right internal jugular dialysis catheter is again noted with distal tip in expected position of the SVC. The visualized skeletal structures are unremarkable.  IMPRESSION: No acute cardiopulmonary abnormality seen.   Electronically Signed  By: Sabino Dick M.D.   On: 09/12/2014 14:08   Dg Chest 2 View  09/03/2014   CLINICAL DATA:  81 year old female with shortness breath for 2-3 days.  EXAM: CHEST  2 VIEW  COMPARISON:  Chest x-ray 09/01/2014.  FINDINGS: There is cephalization of the pulmonary vasculature and slight indistinctness of the interstitial markings suggestive of mild pulmonary edema. Small left pleural effusion. Opacity in the medial aspect of the left lung base may reflect atelectasis and/or consolidation. Heart size appears normal. Upper mediastinal contours are within normal limits. Atherosclerosis in the thoracic aorta.  IMPRESSION: 1. There appears to be mild interstitial pulmonary edema. Given the normal heart size, clinical evaluation for cause of noncardiogenic pulmonary edema suggested. 2. Small left pleural effusion. 3. Developing opacity in the medial aspect of  the left lung base may reflect underlying atelectasis, or airspace consolidation from infection or aspiration. 4. Atherosclerosis.   Electronically Signed   By: Vinnie Langton M.D.   On: 09/03/2014 13:33   Dg Chest 2 View  08/31/2014   CLINICAL DATA:  Weakness for 2 days  EXAM: CHEST  2 VIEW  COMPARISON:  None.  FINDINGS: Cardiac shadow is within normal limits. The lungs are well aerated bilaterally. Very minimal atelectatic changes are noted in the posterior costophrenic angle on the lateral projection likely in the posterior right lower lobe.  IMPRESSION: Minimal right lower lobe atelectasis.   Electronically Signed   By: Inez Catalina M.D.   On: 08/31/2014 07:13   US Renal  08/31/2014   CLINICAL DATA:  Acute kidney injury. Chronic kidney disease stage 2. Diabetes and hypertension  EXAM: RENAL/URINARY TRACT ULTRASOUND COMPLETE  COMPARISON:  None.  FINDINGS: Right Kidney:  Length: 7 cm. Increased parenchymal echogenicity. Inferior pole cyst measures 6 mm. No mass or hydronephrosis noted.  Left Kidney:  Length: 9.1 cm. Increased parenchymal echogenicity. Inferior pole cyst measures 3.1 x 3.1 x 3.0 cm. No mass or hydronephrosis.  Bladder:  Collapsed around a Foley catheter.  Other:  Bilateral pleural effusions noted.  IMPRESSION: 1. No obstructive uropathy. 2. Bilateral renal atrophy and increased parenchymal echogenicity compatible with chronic medical renal disease.   Electronically Signed   By: Kerby Moors M.D.   On: 08/31/2014 12:57   Nm Pulmonary Perf And Vent  09/13/2014   CLINICAL DATA:  Shortness of breath. Syncopal episode during dialysis rib wiring chest compressions. Chest pain.  EXAM: NUCLEAR MEDICINE VENTILATION - PERFUSION LUNG SCAN  TECHNIQUE: Ventilation images were obtained in multiple projections using inhaled aerosol technetium 99 M DTPA. Perfusion images were obtained in multiple projections after intravenous injection of Tc-88mMAA.  RADIOPHARMACEUTICALS:  40.0 mCi Tc-929mTPA aerosol  and 6.0 mCi Tc-9968mA  COMPARISON:  Two-view chest x-ray 09/12/2014.  FINDINGS: Ventilation: There is some clumping of the radiotracer about the left hilum. This is likely artifactual. No defects are evident.  Perfusion: No wedge shaped peripheral perfusion defects to suggest acute pulmonary embolism.  IMPRESSION: No evidence for pulmonary embolism.   Electronically Signed   By: ChrLawrence SantiagoD.   On: 09/13/2014 10:38   Dg Chest Port 1 View  09/06/2014   CLINICAL DATA:  Status post diatek catheter placement.  EXAM: PORTABLE CHEST - 1 VIEW  COMPARISON:  09/04/2014  FINDINGS: The right IJ diatek catheter is in good position. The distal tip is at the cavoatrial junction and the proximal tip is in the distal SVC. No pneumothorax or hematoma. The lungs show improved aeration when compared to  prior chest film. There is persistent perihilar pulmonary edema and vascular congestion. No definite pleural effusions.  IMPRESSION: Right IJ dialysis catheter in good position without complicating features.  Improved aeration since prior study.   Electronically Signed   By: Kalman Jewels M.D.   On: 09/06/2014 13:21   Dg Chest Port 1 View  09/04/2014   CLINICAL DATA:  81 year old female with shortness of Breath. Initial encounter.  EXAM: PORTABLE CHEST - 1 VIEW  COMPARISON:  09/03/2014 and earlier.  FINDINGS: Portable AP semi upright view at 0702 hrs. Increased basilar predominant interstitial opacity and obscuration of the diaphragm. Increased veiling opacity at both lung bases. Stable cardiac size and mediastinal contours. No pneumothorax. Visualized tracheal air column is within normal limits.  IMPRESSION: Progressed pulmonary edema, bilateral pleural effusions, and bibasilar atelectasis.   Electronically Signed   By: Lars Pinks M.D.   On: 09/04/2014 07:21   Dg Chest Port 1 View  09/01/2014   CLINICAL DATA:  Shortness of breath. Stage IV renal disease due to diabetes.  EXAM: PORTABLE CHEST - 1 VIEW  COMPARISON:   08/31/2014  FINDINGS: Normal heart size. Pulmonary vascularity is increasing since prior study with development of slight interstitial pattern in the lung bases. This suggest developing edema. Small left pleural effusion is also developing. Calcification of aorta. No pneumothorax. Mediastinal contours are intact.  IMPRESSION: Developing pulmonary vascular congestion edema with small left pleural effusion.   Electronically Signed   By: Lucienne Capers M.D.   On: 09/01/2014 22:27   Dg Fluoro Guide Cv Line-no Report  09/06/2014   CLINICAL DATA:    FLOURO GUIDE CV LINE  Fluoroscopy was utilized by the requesting physician.  No radiographic  interpretation.   Cardiac cathlogy: No results found for this or any previous visit (from the past 240 hour(s)).   Labs: Results for orders placed or performed during the hospital encounter of 09/12/14 (from the past 48 hour(s))  Glucose, capillary     Status: Abnormal   Collection Time: 09/15/14  4:21 PM  Result Value Ref Range   Glucose-Capillary 156 (H) 70 - 99 mg/dL   Comment 1 Notify RN    Comment 2 Documented in Chart   Glucose, capillary     Status: None   Collection Time: 09/15/14  9:36 PM  Result Value Ref Range   Glucose-Capillary 82 70 - 99 mg/dL  Glucose, capillary     Status: None   Collection Time: 09/16/14  1:50 AM  Result Value Ref Range   Glucose-Capillary 92 70 - 99 mg/dL  Basic metabolic panel     Status: Abnormal   Collection Time: 09/16/14  7:49 AM  Result Value Ref Range   Sodium 136 135 - 145 mmol/L   Potassium 4.1 3.5 - 5.1 mmol/L   Chloride 98 96 - 112 mmol/L   CO2 26 19 - 32 mmol/L   Glucose, Bld 98 70 - 99 mg/dL   BUN 15 6 - 23 mg/dL   Creatinine, Ser 7.00 (H) 0.50 - 1.10 mg/dL    Comment: DELTA CHECK NOTED   Calcium 8.4 8.4 - 10.5 mg/dL   GFR calc non Af Amer 5 (L) >90 mL/min   GFR calc Af Amer 6 (L) >90 mL/min    Comment: (NOTE) The eGFR has been calculated using the CKD EPI equation. This calculation has not been  validated in all clinical situations. eGFR's persistently <90 mL/min signify possible Chronic Kidney Disease.    Anion gap 12 5 -  15  CBC     Status: Abnormal   Collection Time: 09/16/14  7:49 AM  Result Value Ref Range   WBC 8.5 4.0 - 10.5 K/uL   RBC 3.37 (L) 3.87 - 5.11 MIL/uL   Hemoglobin 9.3 (L) 12.0 - 15.0 g/dL   HCT 29.7 (L) 36.0 - 46.0 %   MCV 88.1 78.0 - 100.0 fL   MCH 27.6 26.0 - 34.0 pg   MCHC 31.3 30.0 - 36.0 g/dL   RDW 17.4 (H) 11.5 - 15.5 %   Platelets 96 (L) 150 - 400 K/uL    Comment: CONSISTENT WITH PREVIOUS RESULT  Comprehensive metabolic panel     Status: Abnormal   Collection Time: 09/17/14  5:45 AM  Result Value Ref Range   Sodium 133 (L) 135 - 145 mmol/L   Potassium 4.1 3.5 - 5.1 mmol/L   Chloride 95 (L) 96 - 112 mmol/L   CO2 25 19 - 32 mmol/L   Glucose, Bld 97 70 - 99 mg/dL   BUN 26 (H) 6 - 23 mg/dL   Creatinine, Ser 8.91 (H) 0.50 - 1.10 mg/dL   Calcium 8.3 (L) 8.4 - 10.5 mg/dL   Total Protein 5.6 (L) 6.0 - 8.3 g/dL   Albumin 2.5 (L) 3.5 - 5.2 g/dL   AST 17 0 - 37 U/L   ALT 9 0 - 35 U/L   Alkaline Phosphatase 67 39 - 117 U/L   Total Bilirubin 0.4 0.3 - 1.2 mg/dL   GFR calc non Af Amer 4 (L) >90 mL/min   GFR calc Af Amer 4 (L) >90 mL/min    Comment: (NOTE) The eGFR has been calculated using the CKD EPI equation. This calculation has not been validated in all clinical situations. eGFR's persistently <90 mL/min signify possible Chronic Kidney Disease.    Anion gap 13 5 - 15  Protime-INR     Status: None   Collection Time: 09/17/14  5:45 AM  Result Value Ref Range   Prothrombin Time 13.8 11.6 - 15.2 seconds   INR 1.05 0.00 - 1.49  CBC     Status: Abnormal   Collection Time: 09/17/14  5:45 AM  Result Value Ref Range   WBC 8.8 4.0 - 10.5 K/uL   RBC 3.22 (L) 3.87 - 5.11 MIL/uL   Hemoglobin 8.8 (L) 12.0 - 15.0 g/dL   HCT 27.6 (L) 36.0 - 46.0 %   MCV 85.7 78.0 - 100.0 fL   MCH 27.3 26.0 - 34.0 pg   MCHC 31.9 30.0 - 36.0 g/dL   RDW 17.1 (H) 11.5 -  15.5 %   Platelets 121 (L) 150 - 400 K/uL  Glucose, capillary     Status: None   Collection Time: 09/17/14 11:37 AM  Result Value Ref Range   Glucose-Capillary 82 70 - 99 mg/dL   Comment 1 Notify RN     cardiac cath Mild global LV dysfunction with an ejection fraction of 40-45% without focal segmental wall motion abnormalities, and evidence for left ventricular hypertrophy.  No significant coronary obstructive disease with mild coronary calcification involving the left main and LAD with 20 -30% smooth left main stenosis    Brief narrative: 81 year old upcoming female with past medical history of hypertension, diabetes, and recently diagnosed ESRD just started on HD. She was just diagnosed ESRD in January and underwent left-sided AV fistula placement by vascular surgery on 09/06/2014. She had one dialysis as inpatient before her discharge.   Post discharge, patient has been feeling very well.  She denies any recent CP, SOB, fever, chill or cough. Active. Able to go to store without any problems.Denies any h/o known heart disease. Echo 09/03/14 EF normal no regional WMA. Moderate to severe MR.   She started on her new medication of labetalol 100 mg twice a day. This morning, patient woke up feeling fine. She took public transportation to her dialysis center. 5 minutes into hemodialysis, she yelled to nursing staff that she had significant shortness breath before becoming unresponsive. CPR was initiated after they failed to find her pulse. It it unknown how long her CPR was performed. She eventually had spontaneous return of pulse. She was transported to [facility] for further evaluation.    HPI/Subjective: Denies CP. No dyspnea   Assessment/Plan: Cardiopulmonary arrest Reportedly only 5 minutes after started on dialysis without evidence of hypotension Dr. Lovena Le 2/7 thought " most likely scenario to explain her events is that she had VT and was pulseless followed by CPR and  spontaneous return to NSR. She did not have a documented arrhythmia and was not shocked". Cardiac cath in the setting of LV dysfunction recommended with results as above-VQ scan negative for PE, venous Doppler There is no DVT  Troponin mildly elevated. 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 Increase carvedilol -up to 6.25 mg by mouth twice a day Continue Coreg and hydralazine and Imdur initiated by cardiology Cardiology ordered [employer] life vest to be placed on person before discharge. Do not discharge without life vest   Anemia due to ESRD Hgb 7.2 up to 9.3 post transfusion-now 8.8 transfused 1 u 2/ 6 continue ESA and Fe - FOBT negative this admission but was positive a few wks ago-  Patient received 2 units packed red blood cells 1/27- transfused 1 u 2/ 6 Follow-up CBC outpatient  ESRD Hemodialysis Tuesday Thursday Saturday  Thrombocytopenia, improving Unclear etiology, no evidence of bleeding, platelets were normal on 09/07/14. abrupt drop in platelets upon admission - HIT pending - holding heparin; 200s at last d/c dropped to 49 at the time of admission and now trending up. 121 on 2/9 Holding heparin products while awaiting SRA results   Elevated LFTs, shock liver? AST/L ALT are increased, unclear etiology. Thought initially to be secondary to hypotension. Improving  Diabetes mellitus type 2 Hemoglobin A1c was 4.7 on 08/29/14 indicating tight glycemic control. Started on sensitive SSI and carb modified diet.hypoglycemic 2/6,now stable  Amaryl will be discontinued permanently   Code Status: full    Discharge Exam:    Blood pressure 154/62, pulse 72, temperature 97.7 F (36.5 C), temperature source Oral, resp. rate 21, height _0  (1.651 m), weight 59 kg (130 lb 1.1 oz), SpO2 99 %.  General  appearance: alert, cooperative and no distress Lungs: decceased breath sounds Heart: regular rate and rhythm Extremities: Rt groin without hematoma       Discharge Instructions    Diet - low sodium heart healthy    Complete by:  As directed      Diet - low sodium heart healthy    Complete by:  As directed      Increase activity slowly    Complete by:  As directed      Increase activity slowly    Complete by:  As directed  Follow-up Information    Follow up with Troy Sine, MD.   Specialty:  Cardiology   Why:  office will contact you   Contact information:   [address] [place] [address] [phone]       Follow up with GREEN, Viviann Spare, MD. Schedule an appointment as soon as possible for a visit in 1 week.   Specialty:  Internal Medicine   Contact information:   [place] [address] [phone]       Signed: Reyne Dumas 09/17/2014, 2:13 PM

## 2014-09-17 NOTE — Progress Notes (Signed)
    Subjective:  No chest pain or dyspnea at rest  Objective:  Vital Signs in the last 24 hours: Temp:  [97.9 F (36.6 C)-98.5 F (36.9 C)] 98.2 F (36.8 C) (02/09 1010) Pulse Rate:  [19-70] 19 (02/09 1010) Resp:  [8-32] 25 (02/09 0930) BP: (145-187)/(41-61) 145/53 mmHg (02/09 1010) SpO2:  [95 %-100 %] 97 % (02/09 1010) Weight:  [131 lb 1.6 oz (59.467 kg)] 131 lb 1.6 oz (59.467 kg) (02/08 1900)  Intake/Output from previous day:  Intake/Output Summary (Last 24 hours) at 09/17/14 1023 Last data filed at 09/16/14 1100  Gross per 24 hour  Intake      0 ml  Output      0 ml  Net      0 ml    Physical Exam: General appearance: alert, cooperative and no distress Lungs: decceased breath sounds Heart: regular rate and rhythm Extremities: Rt groin without hematoma   Rate: 78  Rhythm: normal sinus rhythm and PACs  Lab Results:  Recent Labs  09/16/14 0749 09/17/14 0545  WBC 8.5 8.8  HGB 9.3* 8.8*  PLT 96* 121*    Recent Labs  09/16/14 0749 09/17/14 0545  NA 136 133*  K 4.1 4.1  CL 98 95*  CO2 26 25  GLUCOSE 98 97  BUN 15 26*  CREATININE 7.00* 8.91*   No results for input(s): TROPONINI in the last 72 hours.  Invalid input(s): CK, MB  Recent Labs  09/17/14 0545  INR 1.05    Imaging: Imaging results have been reviewed  Cardiac Studies:  Assessment/Plan:  81 y/o female with DM and ESRD. Admitted from dialysis center after "cardiac arrest" 5 minutes into dialysis, no strips. Mildly elevated Troponin. No documented arrhythmia. EF 35% by echo. Cath 09/16/14- normal coronaries, EF 40-45%.   Principal Problem:   Cardiopulmonary arrest Active Problems:   Syncope   DM type 2, uncontrolled, with renal complications   Essential hypertension   ESRD (end stage renal disease)   Congestive dilated cardiomyopathy-EF 40-45% at cath, 35% by echo   Normal coronary arteries 09/16/2014   Anemia of chronic disease   Hypotension on admission   Thrombocytopenia  Troponin level elevated-0.2   PLAN: Her B/P was initially low, now trending up. Consider increasing Coreg to 6.25 mg BID, add Norvasc back in a few days if needed. MD to see this am. We will arrange cardiology follow up as an OP.   Corine ShelterLuke Kilroy PA-C Beeper [phone] 09/17/2014, 10:23 AM   I have seen and examined the patient along with Corine ShelterLuke Kilroy PA-C.  I have reviewed the chart, notes and new data.  I agree with PA's note.   Cath today showed: "Mild global LV dysfunction with an ejection fraction of 40-45% without focal segmental wall motion abnormalities, and evidence for left ventricular hypertrophy. No significant coronary obstructive disease with mild coronary calcification involving the left main and LAD with 20 -30% smooth left main stenosis"   PLAN: Medical Rx for nonischemic CMP with combined systolic and diastolic CHF, especially aggressive Rx of HTN, risk factor modification for early CAD. Increase carvedilol (slowly) Add hydralazine/nitrates rather than amlodipine.  Thurmon FairMihai Camrin Gearheart, MD, [facility] [phone] 09/17/2014, 11:17 AM

## 2014-09-17 NOTE — Progress Notes (Signed)
Pt to go to dialysis.

## 2014-09-17 NOTE — Interval H&P Note (Signed)
Cath Lab Visit (complete for each Cath Lab visit)  Clinical Evaluation Leading to the Procedure:   ACS: Yes.    Non-ACS:    Anginal Classification: CCS IV  Anti-ischemic medical therapy: Maximal Therapy (2 or more classes of medications)  Non-Invasive Test Results: No non-invasive testing performed  Prior CABG: No previous CABG      History and Physical Interval Note:  09/17/2014 7:46 AM  Leonia ReaderGeorgianna Watling  has presented today for surgery, with the diagnosis of cp  The various methods of treatment have been discussed with the patient and family. After consideration of risks, benefits and other options for treatment, the patient has consented to  Procedure(s): LEFT HEART CATHETERIZATION WITH CORONARY ANGIOGRAM (N/A) as a surgical intervention .  The patient's history has been reviewed, patient examined, no change in status, stable for surgery.  I have reviewed the patient's chart and labs.  Questions were answered to the patient's satisfaction.     Babbie Dondlinger A

## 2014-09-17 NOTE — Progress Notes (Signed)
Cardiology ordered [employer] life vest to be placed on person before discharge.  Do not discharge without life vest.  [employer] will come back tomorrow to fit patient.

## 2014-09-17 NOTE — Progress Notes (Signed)
[facility] Progress Note  Assessment: 1. Cardiorespiratory arrest - 5 min into outpatient HD 2/4, EKG no acute change, initial troponin 0.03. ECHO- EF 35%. Heart cath 2/9 today showed EF 40-45% and no sig obstructive CAD.  LVEDP good at 13.   2. ESRD - TTS -  was d/c on a 3 K bath - may be able to change to 2 K bath at discharge- likely eating differently at home; HD orders written in case she is not d/c today 3. Hypertension/volume - 157/63, at dry weight, no vol excess, on hydral  imdur / coreg (was norvasc/ labetalol at home).  4. Anemia - Hgb 7/2 up to 9.3 post transfusion- transfused 1 u 2/ 6 continue ESA and Fe- she had 1/2 + FOBT last admission and was transfused.2 units 1/27- check stool cards again 2/7 was neg 5. Metabolic bone disease -ok. iPTH 170 no vit D yet 6. Nutrition - renal diet Alb 2.5 - needs ONSP at d/c; add vitamin and nepro 7. ^ LFTs - - improved 8. Thrombocytopenia - abrupt drop, much better up to 125 today.  HIT returned indeterminate at 1.197.  Per UTD this has around a 15-30% chance of being true abnormal result; UTD recommends functional assay (SRA) which is the definitive test. She has not acute thrombosis.  I have ordered the SRA and heparin should be held until this is back.  Plan - HD today, mild UF. Holding heparin products while awaiting SRA results.   Vinson Moselle MD pager [phone]    cell [phone] 09/17/2014, 1:14 PM    Subjective:   Feels find except chest where she had CPR, waiting for heart cath  Objective Filed Vitals:   09/17/14 0910 09/17/14 0920 09/17/14 0930 09/17/14 1010  BP: 173/45 164/43 166/41 145/53  Pulse:   28 19  Temp:    98.2 F (36.8 C)  TempSrc:    Oral  Resp: Height:      Weight:      SpO2:   95% 97%   Physical Exam General: NAD sitting in side of bed Heart: RRR Lungs: no rales Abdomen: soft Extremities:no LE edema Dialysis Access: right IJ cath and left upper AVF patent  Dialysis  Orders: [facility] -TTS 3.5 hr 180 new start from [facility] 400/800 had first tmt there 2/2 3.5 hr EDW 59kg  Bath 3 K/ 2.25 Ca left AVF, right IJ heparin 300 Aranesp 40 start 2/4 venofer 100 x 8  Additional Objective Labs: Basic Metabolic Panel:  Recent Labs Lab 09/15/14 0622 09/16/14 0749 09/17/14 0545  NA 136 136 133*  K 3.5 4.1 4.1  CL 97 98 95*  CO2 GLUCOSE 109* 98 97  BUN 8 15 26*  CREATININE 4.38* 7.00* 8.91*  CALCIUM 8.2* 8.4 8.3*   Liver Function Tests:  Recent Labs Lab 09/13/14 0519 09/14/14 0500 09/17/14 0545  AST 129* 64* 17  ALT ALKPHOS 67 59 67  BILITOT 0.2* 0.4 0.4  PROT 5.9* 5.8* 5.6*  ALBUMIN 2.6* 2.5* 2.5*  CBC:  Recent Labs Lab 09/13/14 0519 09/14/14 0500 09/15/14 0622 09/16/14 0749 09/17/14 0545  WBC 7.9 8.4 8.5 8.5 8.8  HGB 7.7* 7.2* 9.5* 9.3* 8.8*  HCT 24.6* 22.7* 30.4* 29.7* 27.6*  MCV 86.0 87.0 88.1 88.1 85.7  PLT 71* 77* 79* 96* 121*   Blood Culture    Component Value Date/Time   SDES URINE, RANDOM 09/01/2014 1230   SPECREQUEST  NONE 09/01/2014 1230   REPTSTATUS 09/03/2014 FINAL 09/01/2014 1230    Cardiac Enzymes:  Recent Labs Lab 09/13/14 1047  TROPONINI 0.06*   CBG:  Recent Labs Lab 09/15/14 1255 09/15/14 1621 09/15/14 2136 09/16/14 0150 09/17/14 1137  GLUCAP 105* 156* 82 92 82  Medications: . sodium chloride     . aspirin EC  81 mg Oral Daily  . carvedilol  6.25 mg Oral BID WC  . [START ON 09/21/2014] darbepoetin (ARANESP) injection - DIALYSIS  100 mcg Intravenous Q Sat-HD  . feeding supplement (NEPRO CARB STEADY)  237 mL Oral BID BM  . ferric gluconate (FERRLECIT/NULECIT) IV  125 mg Intravenous Q T,Th,Sa-HD  . hydrALAZINE  10 mg Oral 3 times per day  . insulin aspart  0-9 Units Subcutaneous TID WC  . isosorbide mononitrate  30 mg Oral Daily  . multivitamin  1 tablet Oral QHS  . sodium chloride  3 mL Intravenous Q12H

## 2014-09-17 NOTE — Care Management Note (Signed)
CARE MANAGEMENT NOTE 09/17/2014  Patient:  Natalie Wolfe, Natalie Wolfe   Account Number:  [id number]  Date Initiated:  09/16/2014  Documentation initiated by:  Marlyn Rabine  Subjective/Objective Assessment:   CM following for progression and d/c planning.     Action/Plan:   Met with pt who is awaiting cardiac cath.  09/17/2014 Met with pt re HH needs, pt has agreed to [facility] services. Care [place] notified of pt needs and plan to d/c.   Anticipated DC Date:  09/18/2014   Anticipated DC Plan:  [place]         Choice offered to / List presented to:          [facility] arranged  HH-3 OT  [place] agency  [place]   Status of service:  In process, will continue to follow Medicare Important Message given?  YES (If response is "NO", the following Medicare IM given date fields will be blank) Date Medicare IM given:  09/16/2014 Medicare IM given by:  Johngabriel Verde Date Additional Medicare IM given:   Additional Medicare IM given by:    Discharge Disposition:    Per UR Regulation:    If discussed at Long Length of Stay Meetings, dates discussed:    Comments:

## 2014-09-17 NOTE — CV Procedure (Signed)
Natalie Wolfe is a 81 y.o. female    [id number]  [id number] LOCATION:  FACILITY: [facility]  PHYSICIAN: Lennette Biharihomas A. Rakayla Ricklefs, MD, [facility] 09-21[facility]   DATE OF PROCEDURE:  09/17/2014    CARDIAC CATHETERIZATION     HISTORY:    Natalie Wolfe is a 81 y.o. female with end-stage renal disease on dialysis, a history of hypertension and diabetes mellitus.  She suffered a recent cardiac arrest 5 minutes into an outpatient dialysis treatment.  She presents for definitive cardiac catheterization.   PROCEDURE:  Left heart catheterization: coronary angiography, left ventriculography.  [facility] in the fasting state. She was premedicated with Versed 1 mg and fentanyl 25 g. His Her right groin was prepped and shaved in usual sterile fashion. Xylocaine 1% was used for local anesthesia. A 5 French sheath was inserted into the R femoral artery. Diagnostic catheterizatiion was done with 5 [place] FL4, FR4, and pigtail catheters. Left ventriculography was done with 25 cc Omnipaque contrast. Hemostasis was obtained by direct manual compression. The patient tolerated the procedure well.   HEMODYNAMICS:   Central Aorta: 166/58   Left Ventricle: 166/13  ANGIOGRAPHY:  Fluoroscopy revealed mild coronary calcification with ossification at the.  A rim of the left main, and in the LAD  Left main: There was ostial superior calcification.  It was smooth 20 -30% left main narrowing proximally.  The vessel into the LAD, an optional diagonal/ramus intermediate vessel and left circumflex coronary artery  LAD: There was mild coronary calcification.  Gave rise to several diagonal and septal perforating arteries.  There was no significant obstructive disease.  The vessel extended to the apex  Ramus Intermediate: This was an optional diagonal vessel that is arose immediately LAD ostium and was angiographically normal.  Left circumflex:  Angiographically normal vessel which gave rise to one major bifurcating marginal branch.   Right coronary artery: Angiographically normal  dominant RCA  Left ventriculography revealed evidence for mild left ventricle hypertrophy. Global ejection fraction was mildly reduced at 40-45% without focal segmental wall motion abnormalities.    IMPRESSION:  Mild global LV dysfunction with an ejection fraction of 40-45% without focal segmental wall motion abnormalities, and evidence for left ventricular hypertrophy.  No significant coronary obstructive disease with mild coronary calcification involving the left main and LAD with 20 -30% smooth left main stenosis.    Lennette Biharihomas A. Wyeth Hoffer, MD, [facility] 09/17/2014 8:16 AM

## 2014-09-17 NOTE — Progress Notes (Addendum)
Site area: RT fem Site Prior to Removal:  Level 0 Pressure Applied For: 60 min Manual:   yes Patient Status During Pull:  A/o Post Pull Site:  Level0 Post Pull Instructions Given:  Pt understands post sheath removal instructions Post Pull Pulses Present: rt pt 2+ Dressing Applied:  tegaderm applied. Dressing to rt groin is CDI Bedrest begins @ 09:30:00 Comments: Pt very hypertensive. Bp Pressure is now 156/41.Rt groin site is soft to touch. Nurse updated on pt. Pt leaves ha in stable condition.

## 2014-09-17 NOTE — Progress Notes (Signed)
[facility] Wol[facility] Progress Note  Assessment/Plan: 1. Cardiopulmonary arrest - 5 min into HD 2/4, EKG no acute change, initial troponin 0.03. ECHO- EF 35%. ddimer elevated- no evidence of PE or DVT. Heart cath showed mild global LV dysfx with ED 40- 45%, no sig obsructive CAD 2. ESRD - TTS -K 4.1 - use 4K bath today - may be able to change to 2 K bath at discharge- likely eating differently at home 3. Hypertension/volume - BP elevated under edw- titrate gently; CXR NAD; Net UF 1.8 2/4 and 1 L 2/6 - post weght both times 57.2 4. Anemia - Hgb 7.2 up to 9.3 post transfusion now 8.8 2/9 - transfused 1 u 2/ 6 continue ESA and Fe- she had 1/2 + FOBT last admission and was transfused.2 units 1/27- check stool cards again 2/7 was neg; Received 60 Aranesp on 2/6 5. Metabolic bone disease -ok. iPTH 170 no vit D yet 6. Nutrition - renal diet Alb 2.5 - needs ONSP at d/c; add vitamin and nepro, titrate edw 7. ^ LFTs - - improved 8. Thrombocytopenia - abrupt drop in platelets upon admission - HIT panel 2/4 pending - holding heparin; 200s at last d/c dropped to 49 at the time of admission and now trending up to 121 9. Disp - anticipate d/c today  Sheffield Slider, PA-C [facility] Beeper [phone] 09/17/2014,10:00 AM  LOS: 5 days   Subjective:   No appetite, drinking supplement - not much on meal tray. Does most of the cooking at home.  Objective Filed Vitals:   09/17/14 0900 09/17/14 0910 09/17/14 0920 09/17/14 0930  BP: 158/60 173/45 164/43 166/41  Pulse:    28  Temp:      TempSrc:      Resp: Height:      Weight:      SpO2:    95%   Physical Exam General: NAD supine in bed Heart: RRR Lungs: no rales Abdomen: soft + BS Extremities: no sig LE edema Dialysis Access: left upper AVF + bruit, right IJ  Dialysis Orders: [facility] -TTS 3.5 hr 180 new start from [facility] 400/800 had first tmt there 2/2 3.5 hr EDW 59 - left at 58.7 3 K 2.25 Ca left AVF, right  IJ hearpin 300 Aranesp 40 start 2/4 venofer 100 x 8  Additional Objective Labs: Basic Metabolic Panel:  Recent Labs Lab 09/15/14 0622 09/16/14 0749 09/17/14 0545  NA 136 136 133*  K 3.5 4.1 4.1  CL 97 98 95*  CO2 GLUCOSE 109* 98 97  BUN 8 15 26*  CREATININE 4.38* 7.00* 8.91*  CALCIUM 8.2* 8.4 8.3*   Liver Function Tests:  Recent Labs Lab 09/13/14 0519 09/14/14 0500 09/17/14 0545  AST 129* 64* 17  ALT ALKPHOS 67 59 67  BILITOT 0.2* 0.4 0.4  PROT 5.9* 5.8* 5.6*  ALBUMIN 2.6* 2.5* 2.5*   CBC:  Recent Labs Lab 09/13/14 0519 09/14/14 0500 09/15/14 0622 09/16/14 0749 09/17/14 0545  WBC 7.9 8.4 8.5 8.5 8.8  HGB 7.7* 7.2* 9.5* 9.3* 8.8*  HCT 24.6* 22.7* 30.4* 29.7* 27.6*  MCV 86.0 87.0 88.1 88.1 85.7  PLT 71* 77* 79* 96* 121*  Cardiac Enzymes:  Recent Labs Lab 09/13/14 1047  TROPONINI 0.06*   CBG:  Recent Labs Lab 09/15/14 0748 09/15/14 1255 09/15/14 1621 09/15/14 2136 09/16/14 0150  GLUCAP 114* 105* 156* 82 92  Medications: . [START ON 09/18/2014] sodium  chloride     . aspirin  81 mg Oral Pre-Cath  . carvedilol  3.125 mg Oral BID WC  . [START ON 09/21/2014] darbepoetin (ARANESP) injection - DIALYSIS  100 mcg Intravenous Q Sat-HD  . feeding supplement (NEPRO CARB STEADY)  237 mL Oral BID BM  . ferric gluconate (FERRLECIT/NULECIT) IV  125 mg Intravenous Q T,Th,Sa-HD  . insulin aspart  0-9 Units Subcutaneous TID WC  . multivitamin  1 tablet Oral QHS  . sodium chloride  3 mL Intravenous Q12H  . sodium chloride  3 mL Intravenous Q12H  . sodium chloride  3 mL Intravenous Q12H

## 2014-09-17 NOTE — Progress Notes (Signed)
Pt back from dialysis to be dcd home today.  Pt stated she had to call cousin to pick her up due to no transportation or help tonight at home.  Paged Dr. Ellis ParentsAbrol FYI

## 2014-09-18 DIAGNOSIS — Z992 Dependence on renal dialysis: Secondary | ICD-10-CM

## 2014-09-18 LAB — CBC
HEMATOCRIT: 29.1 % — AB (ref 36.0–46.0)
Hemoglobin: 8.9 g/dL — ABNORMAL LOW (ref 12.0–15.0)
MCH: 27 pg (ref 26.0–34.0)
MCHC: 30.6 g/dL (ref 30.0–36.0)
MCV: 88.2 fL (ref 78.0–100.0)
PLATELETS: 155 10*3/uL (ref 150–400)
RBC: 3.3 MIL/uL — AB (ref 3.87–5.11)
RDW: 18 % — ABNORMAL HIGH (ref 11.5–15.5)
WBC: 8.1 10*3/uL (ref 4.0–10.5)

## 2014-09-18 LAB — GLUCOSE, CAPILLARY
GLUCOSE-CAPILLARY: 90 mg/dL (ref 70–99)
Glucose-Capillary: 99 mg/dL (ref 70–99)
Glucose-Capillary: 147 mg/dL — ABNORMAL HIGH (ref 70–99)
Glucose-Capillary: 107 mg/dL — ABNORMAL HIGH (ref 70–99)
Glucose-Capillary: 121 mg/dL — ABNORMAL HIGH (ref 70–99)

## 2014-09-18 NOTE — Progress Notes (Signed)
09/18/2014 12:20 PM  Received phone call from [employer] representative stating that the patient's insurance had denied coverage for life vest at this time.  Representative cited that the patient did not qualify for the life vest because her EF per cath was >35% and when she arrested she didn't have to be defibrillated.  Information was relayed to [facility]heryl, Case Manager and Dr. Susie CassetteAbrol. Theadora RamaKIRKMAN, Kynzli Rease Brooke

## 2014-09-18 NOTE — Discharge Summary (Addendum)
Physician Discharge Summary  Natalie Wolfe MRN: [id number] DOB/AGE: October 07, 1929 81 y.o.  PCP: Estill Dooms, MD   Admit date: 09/12/2014 Discharge date: 09/18/2014  Discharge Diagnoses:  :   Cardiopulmonary arrest Active Problems:   DM type 2, uncontrolled, with renal complications   Essential hypertension   Anemia of chronic disease   Syncope   ESRD (end stage renal disease)   Hypotension on admission   Thrombocytopenia   Troponin level elevated-0.2   Congestive dilated cardiomyopathy-EF 40-45% at cath, 35% by echo   Normal coronary arteries 09/16/2014   Follow-up recommendations 1. functional assay (SRA) to be checked by nephrology for HIT , hold heparin until his confirmed   Follow-up CBC, CMP in 3-5 days and with hemodialysis Follow-up with PCP in 5-7 days Representative cited that the patient did not qualify for the life vest because her EF per cath was >35% and when she arrested she didn't have to be defibrillated     Medication List    STOP taking these medications        amLODipine 5 MG tablet  Commonly known as:  NORVASC     labetalol 100 MG tablet  Commonly known as:  NORMODYNE      TAKE these medications        acetaminophen 325 MG tablet  Commonly known as:  TYLENOL  Take 650 mg by mouth daily as needed for mild pain.     aspirin 81 MG EC tablet  Take 1 tablet (81 mg total) by mouth daily.     calcium acetate 667 MG capsule  Commonly known as:  PHOSLO  Take 1 capsule (667 mg total) by mouth 3 (three) times daily with meals.     carvedilol 6.25 MG tablet  Commonly known as:  COREG  Take 1 tablet (6.25 mg total) by mouth 2 (two) times daily with a meal.     feeding supplement (NEPRO CARB STEADY) Liqd  Take 237 mLs by mouth as needed (missed meal during dialysis.).     fluocinonide cream 0.05 %  Commonly known as:  LIDEX  Apply 1 application topically 2 (two) times daily. Apply from neck down twice a day (provided by dermatology).                glucose blood test strip  Commonly known as:  BAYER CONTOUR NEXT TEST  Use as instructed     hydrALAZINE 25 MG tablet  Commonly known as:  APRESOLINE  Take 1 tablet (25 mg total) by mouth every 8 (eight) hours.     hydrocortisone 2.5 % lotion  Apply 1 application topically 2 (two) times daily. Apply to skin and face     isosorbide mononitrate 30 MG 24 hr tablet  Commonly known as:  IMDUR  Take 1 tablet (30 mg total) by mouth daily.     multivitamin Tabs tablet  Take 1 tablet by mouth at bedtime.     VESICARE 5 MG tablet  Generic drug:  solifenacin  Take 5 mg by mouth daily.        Discharge Condition:Stable tion: 06-Home-Health Care Svc   Consults Cardiology Nephrology  cant Diagnostic Studies: Dg Chest 2 View  09/12/2014   CLINICAL DATA:  Syncope.  EXAM: CHEST  2 VIEW  COMPARISON:  September 06, 2014.  FINDINGS: The heart size and mediastinal contours are within normal limits. Both lungs are clear. No pneumothorax or pleural effusion is noted. Right internal jugular dialysis catheter is again noted  with distal tip in expected position of the SVC. The visualized skeletal structures are unremarkable.  IMPRESSION: No acute cardiopulmonary abnormality seen.   Electronically Signed   By: Sabino Dick M.D.   On: 09/12/2014 14:08   Dg Chest 2 View  09/03/2014   CLINICAL DATA:  81 year old female with shortness breath for 2-3 days.  EXAM: CHEST  2 VIEW  COMPARISON:  Chest x-ray 09/01/2014.  FINDINGS: There is cephalization of the pulmonary vasculature and slight indistinctness of the interstitial markings suggestive of mild pulmonary edema. Small left pleural effusion. Opacity in the medial aspect of the left lung base may reflect atelectasis and/or consolidation. Heart size appears normal. Upper mediastinal contours are within normal limits. Atherosclerosis in the thoracic aorta.  IMPRESSION: 1. There appears to be mild interstitial pulmonary edema. Given the normal  heart size, clinical evaluation for cause of noncardiogenic pulmonary edema suggested. 2. Small left pleural effusion. 3. Developing opacity in the medial aspect of the left lung base may reflect underlying atelectasis, or airspace consolidation from infection or aspiration. 4. Atherosclerosis.   Electronically Signed   By: Vinnie Langton M.D.   On: 09/03/2014 13:33   Dg Chest 2 View  08/31/2014   CLINICAL DATA:  Weakness for 2 days  EXAM: CHEST  2 VIEW  COMPARISON:  None.  FINDINGS: Cardiac shadow is within normal limits. The lungs are well aerated bilaterally. Very minimal atelectatic changes are noted in the posterior costophrenic angle on the lateral projection likely in the posterior right lower lobe.  IMPRESSION: Minimal right lower lobe atelectasis.   Electronically Signed   By: Inez Catalina M.D.   On: 08/31/2014 07:13   US Renal  08/31/2014   CLINICAL DATA:  Acute kidney injury. Chronic kidney disease stage 2. Diabetes and hypertension  EXAM: RENAL/URINARY TRACT ULTRASOUND COMPLETE  COMPARISON:  None.  FINDINGS: Right Kidney:  Length: 7 cm. Increased parenchymal echogenicity. Inferior pole cyst measures 6 mm. No mass or hydronephrosis noted.  Left Kidney:  Length: 9.1 cm. Increased parenchymal echogenicity. Inferior pole cyst measures 3.1 x 3.1 x 3.0 cm. No mass or hydronephrosis.  Bladder:  Collapsed around a Foley catheter.  Other:  Bilateral pleural effusions noted.  IMPRESSION: 1. No obstructive uropathy. 2. Bilateral renal atrophy and increased parenchymal echogenicity compatible with chronic medical renal disease.   Electronically Signed   By: Kerby Moors M.D.   On: 08/31/2014 12:57   Nm Pulmonary Perf And Vent  09/13/2014   CLINICAL DATA:  Shortness of breath. Syncopal episode during dialysis rib wiring chest compressions. Chest pain.  EXAM: NUCLEAR MEDICINE VENTILATION - PERFUSION LUNG SCAN  TECHNIQUE: Ventilation images were obtained in multiple projections using inhaled aerosol  technetium 99 M DTPA. Perfusion images were obtained in multiple projections after intravenous injection of Tc-44mMAA.  RADIOPHARMACEUTICALS:  40.0 mCi Tc-975mTPA aerosol and 6.0 mCi Tc-9945mA  COMPARISON:  Two-view chest x-ray 09/12/2014.  FINDINGS: Ventilation: There is some clumping of the radiotracer about the left hilum. This is likely artifactual. No defects are evident.  Perfusion: No wedge shaped peripheral perfusion defects to suggest acute pulmonary embolism.  IMPRESSION: No evidence for pulmonary embolism.   Electronically Signed   By: ChrLawrence SantiagoD.   On: 09/13/2014 10:38   Dg Chest Port 1 View  09/06/2014   CLINICAL DATA:  Status post diatek catheter placement.  EXAM: PORTABLE CHEST - 1 VIEW  COMPARISON:  09/04/2014  FINDINGS: The right IJ diatek catheter is in good position. The  distal tip is at the cavoatrial junction and the proximal tip is in the distal SVC. No pneumothorax or hematoma. The lungs show improved aeration when compared to prior chest film. There is persistent perihilar pulmonary edema and vascular congestion. No definite pleural effusions.  IMPRESSION: Right IJ dialysis catheter in good position without complicating features.  Improved aeration since prior study.   Electronically Signed   By: Kalman Jewels M.D.   On: 09/06/2014 13:21   Dg Chest Port 1 View  09/04/2014   CLINICAL DATA:  81 year old female with shortness of Breath. Initial encounter.  EXAM: PORTABLE CHEST - 1 VIEW  COMPARISON:  09/03/2014 and earlier.  FINDINGS: Portable AP semi upright view at 0702 hrs. Increased basilar predominant interstitial opacity and obscuration of the diaphragm. Increased veiling opacity at both lung bases. Stable cardiac size and mediastinal contours. No pneumothorax. Visualized tracheal air column is within normal limits.  IMPRESSION: Progressed pulmonary edema, bilateral pleural effusions, and bibasilar atelectasis.   Electronically Signed   By: Lars Pinks M.D.   On: 09/04/2014  07:21   Dg Chest Port 1 View  09/01/2014   CLINICAL DATA:  Shortness of breath. Stage IV renal disease due to diabetes.  EXAM: PORTABLE CHEST - 1 VIEW  COMPARISON:  08/31/2014  FINDINGS: Normal heart size. Pulmonary vascularity is increasing since prior study with development of slight interstitial pattern in the lung bases. This suggest developing edema. Small left pleural effusion is also developing. Calcification of aorta. No pneumothorax. Mediastinal contours are intact.  IMPRESSION: Developing pulmonary vascular congestion edema with small left pleural effusion.   Electronically Signed   By: Lucienne Capers M.D.   On: 09/01/2014 22:27   Dg Fluoro Guide Cv Line-no Report  09/06/2014   CLINICAL DATA:    FLOURO GUIDE CV LINE  Fluoroscopy was utilized by the requesting physician.  No radiographic  interpretation.   Cardiac cathlogy: No results found for this or any previous visit (from the past 240 hour(s)).   Labs: Results for orders placed or performed during the hospital encounter of 09/12/14 (from the past 48 hour(s))  Glucose, capillary     Status: None   Collection Time: 09/16/14  2:09 PM  Result Value Ref Range   Glucose-Capillary 86 70 - 99 mg/dL  Glucose, capillary     Status: Abnormal   Collection Time: 09/16/14  3:45 PM  Result Value Ref Range   Glucose-Capillary 110 (H) 70 - 99 mg/dL  Glucose, capillary     Status: Abnormal   Collection Time: 09/16/14  8:58 PM  Result Value Ref Range   Glucose-Capillary 107 (H) 70 - 99 mg/dL  Comprehensive metabolic panel     Status: Abnormal   Collection Time: 09/17/14  5:45 AM  Result Value Ref Range   Sodium 133 (L) 135 - 145 mmol/L   Potassium 4.1 3.5 - 5.1 mmol/L   Chloride 95 (L) 96 - 112 mmol/L   CO2 25 19 - 32 mmol/L   Glucose, Bld 97 70 - 99 mg/dL   BUN 26 (H) 6 - 23 mg/dL   Creatinine, Ser 8.91 (H) 0.50 - 1.10 mg/dL   Calcium 8.3 (L) 8.4 - 10.5 mg/dL   Total Protein 5.6 (L) 6.0 - 8.3 g/dL   Albumin 2.5 (L) 3.5 - 5.2 g/dL    AST 17 0 - 37 U/L   ALT 9 0 - 35 U/L   Alkaline Phosphatase 67 39 - 117 U/L   Total Bilirubin 0.4 0.3 - 1.2  mg/dL   GFR calc non Af Amer 4 (L) >90 mL/min   GFR calc Af Amer 4 (L) >90 mL/min    Comment: (NOTE) The eGFR has been calculated using the CKD EPI equation. This calculation has not been validated in all clinical situations. eGFR's persistently <90 mL/min signify possible Chronic Kidney Disease.    Anion gap 13 5 - 15  Protime-INR     Status: None   Collection Time: 09/17/14  5:45 AM  Result Value Ref Range   Prothrombin Time 13.8 11.6 - 15.2 seconds   INR 1.05 0.00 - 1.49  CBC     Status: Abnormal   Collection Time: 09/17/14  5:45 AM  Result Value Ref Range   WBC 8.8 4.0 - 10.5 K/uL   RBC 3.22 (L) 3.87 - 5.11 MIL/uL   Hemoglobin 8.8 (L) 12.0 - 15.0 g/dL   HCT 27.6 (L) 36.0 - 46.0 %   MCV 85.7 78.0 - 100.0 fL   MCH 27.3 26.0 - 34.0 pg   MCHC 31.9 30.0 - 36.0 g/dL   RDW 17.1 (H) 11.5 - 15.5 %   Platelets 121 (L) 150 - 400 K/uL  Glucose, capillary     Status: None   Collection Time: 09/17/14 11:37 AM  Result Value Ref Range   Glucose-Capillary 82 70 - 99 mg/dL   Comment 1 Notify RN   Glucose, capillary     Status: None   Collection Time: 09/17/14  4:56 PM  Result Value Ref Range   Glucose-Capillary 84 70 - 99 mg/dL  Glucose, capillary     Status: Abnormal   Collection Time: 09/17/14 10:29 PM  Result Value Ref Range   Glucose-Capillary 147 (H) 70 - 99 mg/dL  Glucose, capillary     Status: Abnormal   Collection Time: 09/18/14  7:57 AM  Result Value Ref Range   Glucose-Capillary 107 (H) 70 - 99 mg/dL  Glucose, capillary     Status: Abnormal   Collection Time: 09/18/14 11:43 AM  Result Value Ref Range   Glucose-Capillary 121 (H) 70 - 99 mg/dL  CBC     Status: Abnormal   Collection Time: 09/18/14 11:58 AM  Result Value Ref Range   WBC 8.1 4.0 - 10.5 K/uL   RBC 3.30 (L) 3.87 - 5.11 MIL/uL   Hemoglobin 8.9 (L) 12.0 - 15.0 g/dL   HCT 29.1 (L) 36.0 - 46.0 %   MCV  88.2 78.0 - 100.0 fL   MCH 27.0 26.0 - 34.0 pg   MCHC 30.6 30.0 - 36.0 g/dL   RDW 18.0 (H) 11.5 - 15.5 %   Platelets 155 150 - 400 K/uL    cardiac cath Mild global LV dysfunction with an ejection fraction of 40-45% without focal segmental wall motion abnormalities, and evidence for left ventricular hypertrophy.  No significant coronary obstructive disease with mild coronary calcification involving the left main and LAD with 20 -30% smooth left main stenosis    Brief narrative: 81 year old upcoming female with past medical history of hypertension, diabetes, and recently diagnosed ESRD just started on HD. She was just diagnosed ESRD in January and underwent left-sided AV fistula placement by vascular surgery on 09/06/2014. She had one dialysis as inpatient before her discharge.   Post discharge, patient has been feeling very well. She denies any recent CP, SOB, fever, chill or cough. Active. Able to go to store without any problems.Denies any h/o known heart disease. Echo 09/03/14 EF normal no regional WMA. Moderate to severe MR.  She started on her new medication of labetalol 100 mg twice a day. This morning, patient woke up feeling fine. She took public transportation to her dialysis center. 5 minutes into hemodialysis, she yelled to nursing staff that she had significant shortness breath before becoming unresponsive. CPR was initiated after they failed to find her pulse. It it unknown how long her CPR was performed. She eventually had spontaneous return of pulse. She was transported to [facility] for further evaluation.    HPI/Subjective: Denies CP. No dyspnea   Assessment/Plan: Cardiopulmonary arrest Reportedly only 5 minutes after started on dialysis without evidence of hypotension Dr. Lovena Le 2/7 thought " most likely scenario to explain her events is that she had VT and was pulseless followed by CPR and spontaneous return to NSR. She did not have a documented arrhythmia and  was not shocked". Cardiac cath in the setting of LV dysfunction recommended with results as above-VQ scan negative for PE, venous Doppler There is no DVT  Troponin mildly elevated. 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 Increase carvedilol -up to 6.25 mg by mouth twice a day Continue Coreg and hydralazine and Imdur initiated by cardiology Cardiology ordered Zoll life vest   But the patient did not qualify  Representative cited that the patient did not qualify for the life vest because her EF per cath was >35% and when she arrested she didn't have to be defibrillated  Anemia due to ESRD Hgb 7.2 up to 9.3 post transfusion-now 8.8 transfused 1 u 2/ 6 continue ESA and Fe - FOBT negative this admission but was positive a few wks ago-  Patient received 2 units packed red blood cells 1/27- transfused 1 u 2/ 6 Follow-up CBC outpatient    ESRD Hemodialysis Tuesday Thursday Saturday  Thrombocytopenia, improving Unclear etiology, no evidence of bleeding, platelets were normal on 09/07/14. abrupt drop in platelets upon admission - HIT pending - holding heparin; 200s at last d/c dropped to 49 at the time of admission and now trending up. 121 on 2/9 Holding heparin products while awaiting SRA results  hematology consultation pending  For HIT positivity  And  Its significance  Elevated LFTs, shock liver? AST/L ALT are increased, unclear etiology. Thought initially to be secondary to hypotension. Improving  Diabetes mellitus type 2 Hemoglobin A1c was 4.7 on 08/29/14 indicating tight glycemic control. Started on sensitive SSI and carb modified diet.hypoglycemic 2/6,now stable  Amaryl will be discontinued permanently   Code Status: full    Discharge Exam:    Blood pressure 148/73, pulse 62, temperature 98.2 F  (36.8 C), temperature source Oral, resp. rate 18, height 5' 5"  (1.651 m), weight 56.7 kg (125 lb), SpO2 96 %.  General appearance: alert, cooperative and no distress Lungs: decceased breath sounds Heart: regular rate and rhythm Extremities: Rt groin without hematoma   Discharge Instructions    Diet - low sodium heart healthy    Complete by:  As directed      Diet - low sodium heart healthy    Complete by:  As directed      Increase activity slowly    Complete by:  As directed      Increase activity slowly    Complete by:  As directed            Follow-up Information  Follow up with Troy Sine, MD.   Specialty:  Cardiology   Why:  office will contact you   Contact information:   [address] [place] [address] [phone]       Follow up with GREEN, Viviann Spare, MD. Schedule an appointment as soon as possible for a visit in 1 week.   Specialty:  Internal Medicine   Contact information:   [place] [address] [phone]       Signed: Reyne Dumas 09/18/2014, 1:05 PM

## 2014-09-18 NOTE — Progress Notes (Signed)
Patient Discharge: Disposition: Patient discharged to home with home health. Education: Patient educated about medications, prescriptions, follow-up appointments, and discharge instructions. IV: Discontinued IV before discharge. Telemetry: Discontinued Telemetry before discharge.  CCMD notified. Transportation: Patient transported in w/c with the family accompanying. Belongings: Patient took all her belongings with her.

## 2014-09-18 NOTE — Progress Notes (Signed)
Called Dr. Susie CassetteAbrol regarding discharge plan and asked her if pt needs to be seen by cardiologist before discharge.  She said pt doesn't need to be seen by cardiologist and gave verbal order to discharge patient.

## 2014-09-18 NOTE — Telephone Encounter (Signed)
Closed encounter °

## 2014-09-18 NOTE — Consult Note (Signed)
[place]  Telephone:(336) [place] NOTE  Natalie Wolfe                                MR#: [id number]  DOB: 01-27-1930                       CSN#: [id number]  Patient Care Team: Estill Dooms, MD as PCP - General (Internal Medicine) Elmarie Shiley, MD as Consulting Physician (Nephrology) Referring MD: [facility]    Reason for Consult: Thrombocytopenia  Natalie Wolfe 81 y.o. female with a recent diagnosis of ESRD, admitted 09/12/2014 due to cardiopulmonary arrest during hemodialysis, with subsequent loss of consciousness. This required ACLS protocol, with successful resuscitation. She was transferred by ambulance to [facility] for management and further evaluation.   She was found to have abnormal CBC upon arrival The platelet count 49,000, white count 14.8.Her platelet counts as of 09/07/2014 were 222,000  She denies recent bruising or acute bleeding, such as spontaneous epistaxis, hematuria, melena or hematochezia The patient denies history of liver disease, risk factors for HIV. She may have been exposed to heparin during AV fistula insertion on 09/06/2014, prior to her admission. Echo 09/03/14 EF normal no regional WMA. Moderate to severe MR.Denies prior cardiovascular surgery. She may have received baby ASA after catheterization. Patient never had a hematological evaluation prior to this admission. She never had a bone marrow biopsy. Denies any ticks or other insect bites. Due to the significant drop, we were requested to see Natalie Wolfe in consultation  HIT panel and DIC on 09/12/2014 shows D Dimer 9.57, fibrinogen 552, ptt 45, PT 14.8 and INR 1.15  SPEP and UPEP pending.  HIT panel on 2/4 was indeterminate at 1.197. SRA is pending for definite answer.  Her hemoglobin on admission was 7.2, requiring 1 unit on 09/14/2014, improving to 9.3.Marland Kitchen She is now receiving Aranesp and Venofer as well. B12 320. Folate 276.  Hemoccult  negative  PMH:  Past Medical History  Diagnosis Date  . Urinary frequency   . Routine general medical examination at a health care facility   . Other atopic dermatitis and related conditions   . Rash and other nonspecific skin eruption   . Other anxiety states   . Insomnia, unspecified   . Chronic kidney disease, stage II (mild)   . Type II or unspecified type diabetes mellitus with renal manifestations, not stated as uncontrolled   . PVD (peripheral vascular disease)   . Unspecified vitamin D deficiency   . Anemia, unspecified   . Obesity, unspecified   . Nocturia   . Pain in joint, lower leg   . Other and unspecified hyperlipidemia   . Unspecified essential hypertension   . Cataract     Surgeries:  Past Surgical History  Procedure Laterality Date  . Abdominal hysterectomy  1970  . Breast surgery  1979    nodule removed  . Eye surgery  2004    catarcts removed from right eye.  . Multiple tooth extractions  09/2013    Remonve remaining 3 teeth, no with dentures   . Av fistula placement Left 09/06/2014    Procedure: CREATION OF LEFT UPPER ARM ARTERIOVENOUS (AV) FISTULA ;  Surgeon: Mal Misty, MD;  Location: [place];  Service: Vascular;  Laterality: Left;  . Insertion of dialysis catheter N/A 09/06/2014    Procedure: INSERTION  OF DIALYSIS CATHETER RIGHT INTERNAL JUGULAR VEIN;  Surgeon: Mal Misty, MD;  Location: [name];  Service: Vascular;  Laterality: N/A;  . Ligation of competing branches of arteriovenous fistula Left 09/06/2014    Procedure: LIGATION OF COMPETING BRANCHES OF ARTERIOVENOUS FISTULA;  Surgeon: Mal Misty, MD;  Location: [place];  Service: Vascular;  Laterality: Left;  . Left heart catheterization with coronary angiogram N/A 09/17/2014    Procedure: LEFT HEART CATHETERIZATION WITH CORONARY ANGIOGRAM;  Surgeon: Troy Sine, MD;  Location: [facility] CATH LAB;  Service: Cardiovascular;  Laterality: N/A;    Allergies:  Allergies  Allergen Reactions    . Heparin Other (See Comments)    Positive Hep Induced Plt Ab and SRA 09/12/2014    Medications:    prior to admission:  Prescriptions prior to admission  Medication Sig Dispense Refill Last Dose  . acetaminophen (TYLENOL) 325 MG tablet Take 650 mg by mouth daily as needed for mild pain.    Unk  . amLODipine (NORVASC) 5 MG tablet Take 5 mg by mouth daily.   unknown  . calcium acetate (PHOSLO) 667 MG capsule Take 1 capsule (667 mg total) by mouth 3 (three) times daily with meals. 90 capsule 0 unknown  . fluocinonide cream (LIDEX) 9.37 % Apply 1 application topically 2 (two) times daily. Apply from neck down twice a day (provided by dermatology).   unknown  . glimepiride (AMARYL) 2 MG tablet Take 1 mg by mouth daily.   unknown  . glucose blood (BAYER CONTOUR NEXT TEST) test strip Use as instructed 100 each 12 unknown  . hydrocortisone 2.5 % lotion Apply 1 application topically 2 (two) times daily. Apply to skin and face   unknown  . labetalol (NORMODYNE) 100 MG tablet Take 1 tablet (100 mg total) by mouth 2 (two) times daily. 30 tablet 0 unknown  . multivitamin (RENA-VIT) TABS tablet Take 1 tablet by mouth at bedtime. 30 each 0 unknown  . VESICARE 5 MG tablet Take 5 mg by mouth daily.   unknown    . aspirin EC  81 mg Oral Daily  . carvedilol  6.25 mg Oral BID WC  . [START ON 09/21/2014] darbepoetin (ARANESP) injection - DIALYSIS  100 mcg Intravenous Q Sat-HD  . feeding supplement (NEPRO CARB STEADY)  237 mL Oral BID BM  . ferric gluconate (FERRLECIT/NULECIT) IV  125 mg Intravenous Q T,Th,Sa-HD  . hydrALAZINE  10 mg Oral 3 times per day  . insulin aspart  0-9 Units Subcutaneous TID WC  . isosorbide mononitrate  30 mg Oral Daily  . multivitamin  1 tablet Oral QHS  . sodium chloride  3 mL Intravenous Q12H    PRN:[DISCONTINUED] acetaminophen **OR** acetaminophen, acetaminophen, HYDROcodone-acetaminophen, ondansetron (ZOFRAN) IV  ROS: Constitutional: Denies fevers, chills or abnormal  night sweats Eyes: Denies blurriness of vision, double vision or watery eyes Ears, nose, mouth, throat, and face: Denies mucositis or sore throat Respiratory: Denies cough, dyspnea or wheezes Cardiovascular: Denies palpitations, chest discomfort or lower extremity swelling Gastrointestinal:  Denies nausea, heartburn or change in bowel habits Skin: Denies abnormal skin rashes Lymphatics: Denies new lymphadenopathy or easy bruising Neurological:Denies numbness, tingling or new weaknesses Behavioral/Psych: Mood is stable, no new changes  All other systems were reviewed with the patient and are negative.    Family History:    Family History  Problem Relation Age of Onset  . Diabetes Mother     No family history of hematological  disorders.  Social History:  reports  that she quit smoking about 14 years ago. Her smoking use included Cigarettes. She does not have any smokeless tobacco history on file. She reports that she does not drink alcohol or use illicit drugs.  Physical Exam    ECOG PERFORMANCE STATUS: 3  Filed Vitals:   09/18/14 1000  BP: 148/73  Pulse: 62  Temp: 98.2 F (36.8 C)  Resp: 18   Filed Weights   09/17/14 1252 09/17/14 1605 09/17/14 2232  Weight: 130 lb 1.1 oz (59 kg) 125 lb (56.7 kg) 125 lb (56.7 kg)    GENERAL:alert, no distress and comfortable SKIN: skin color, texture, turgor are normal, no rashes or significant lesions EYES: normal, conjunctiva are pink and non-injected, sclera clear OROPHARYNX:no exudate, no erythema and lips, buccal mucosa, and tongue normal  NECK: supple, thyroid normal size, non-tender, without nodularity LYMPH:  no palpable lymphadenopathy in the cervical, axillary or inguinal area LUNGS: Decreased breath sounds at the bases, with normal breathing effort HEART: irregular rate & rhythm and no murmurs and no lower extremity edema. Left AV fistula ABDOMEN:abdomen soft, non-tender and normal bowel sounds Musculoskeletal:no cyanosis  of digits and no clubbing  PSYCH: alert & oriented x 3 with fluent speech NEURO: no focal motor/sensory deficits   Labs:    Recent Labs Lab 09/13/14 0519 09/14/14 0500 09/15/14 0622 09/16/14 0749 09/17/14 0545  WBC 7.9 8.4 8.5 8.5 8.8  HGB 7.7* 7.2* 9.5* 9.3* 8.8*  HCT 24.6* 22.7* 30.4* 29.7* 27.6*  PLT 71* 77* 79* 96* 121*  MCV 86.0 87.0 88.1 88.1 85.7  MCH 26.9 27.6 27.5 27.6 27.3  MCHC 31.3 31.7 31.3 31.3 31.9  RDW 18.8* 18.9* 17.7* 17.4* 17.1*        Recent Labs Lab 09/12/14 1253 09/13/14 0519 09/14/14 0500 09/15/14 0622 09/16/14 0749 09/17/14 0545  NA 137 140 137 136 136 133*  K 4.5 3.6 4.2 3.5 4.1 4.1  CL 101 102 101 97 98 95*  CO2 24 30 28 30 26 25   GLUCOSE 112* 70 95 109* 98 97  BUN 20 8 11 8 15  26*  CREATININE 8.08* 3.93* 6.84* 4.38* 7.00* 8.91*  CALCIUM 8.2* 7.8* 8.1* 8.2* 8.4 8.3*  AST 265* 129* 64*  --   --  17  ALT 51* 31 20  --   --  9  ALKPHOS 73 67 59  --   --  67  BILITOT 0.3 0.2* 0.4  --   --  0.4        Component Value Date/Time   BILITOT 0.4 09/17/2014 0545      Recent Labs Lab 09/12/14 1730 09/17/14 0545  INR 1.15 1.05    No results for input(s): DDIMER in the last 72 hours.   Anemia panel:  No results for input(s): VITAMINB12, FOLATE, FERRITIN, TIBC, IRON, RETICCTPCT in the last 72 hours.  Urinalysis    Component Value Date/Time   COLORURINE YELLOW 08/31/2014 0822   APPEARANCEUR CLEAR 08/31/2014 0822   LABSPEC 1.014 08/31/2014 0822   PHURINE 7.0 08/31/2014 0822   GLUCOSEU NEGATIVE 08/31/2014 0822   HGBUR MODERATE* 08/31/2014 0822   BILIRUBINUR SMALL* 08/31/2014 0822   KETONESUR NEGATIVE 08/31/2014 0822   PROTEINUR 100* 08/31/2014 0822   UROBILINOGEN 0.2 08/31/2014 0822   NITRITE NEGATIVE 08/31/2014 0822   LEUKOCYTESUR TRACE* 08/31/2014 0822    Drugs of Abuse  No results found for: LABOPIA, COCAINSCRNUR, LABBENZ, AMPHETMU, THCU, LABBARB   Imaging Studies:  Dg Chest 2 View  09/12/2014   CLINICAL DATA:  Syncope.  EXAM: CHEST  2 VIEW  COMPARISON:  September 06, 2014.  FINDINGS: The heart size and mediastinal contours are within normal limits. Both lungs are clear. No pneumothorax or pleural effusion is noted. Right internal jugular dialysis catheter is again noted with distal tip in expected position of the SVC. The visualized skeletal structures are unremarkable.  IMPRESSION: No acute cardiopulmonary abnormality seen.   Electronically Signed   By: Sabino Dick M.D.   On: 09/12/2014 14:08   Dg Chest 2 View  09/03/2014   CLINICAL DATA:  81 year old female with shortness breath for 2-3 days.  EXAM: CHEST  2 VIEW  COMPARISON:  Chest x-ray 09/01/2014.  FINDINGS: There is cephalization of the pulmonary vasculature and slight indistinctness of the interstitial markings suggestive of mild pulmonary edema. Small left pleural effusion. Opacity in the medial aspect of the left lung base may reflect atelectasis and/or consolidation. Heart size appears normal. Upper mediastinal contours are within normal limits. Atherosclerosis in the thoracic aorta.  IMPRESSION: 1. There appears to be mild interstitial pulmonary edema. Given the normal heart size, clinical evaluation for cause of noncardiogenic pulmonary edema suggested. 2. Small left pleural effusion. 3. Developing opacity in the medial aspect of the left lung base may reflect underlying atelectasis, or airspace consolidation from infection or aspiration. 4. Atherosclerosis.   Electronically Signed   By: Vinnie Langton M.D.   On: 09/03/2014 13:33   Dg Chest 2 View  08/31/2014   CLINICAL DATA:  Weakness for 2 days  EXAM: CHEST  2 VIEW  COMPARISON:  None.  FINDINGS: Cardiac shadow is within normal limits. The lungs are well aerated bilaterally. Very minimal atelectatic changes are noted in the posterior costophrenic angle on the lateral projection likely in the posterior right lower lobe.  IMPRESSION: Minimal right lower lobe atelectasis.   Electronically Signed   By:  Inez Catalina M.D.   On: 08/31/2014 07:13   US Renal  08/31/2014   CLINICAL DATA:  Acute kidney injury. Chronic kidney disease stage 2. Diabetes and hypertension  EXAM: RENAL/URINARY TRACT ULTRASOUND COMPLETE  COMPARISON:  None.  FINDINGS: Right Kidney:  Length: 7 cm. Increased parenchymal echogenicity. Inferior pole cyst measures 6 mm. No mass or hydronephrosis noted.  Left Kidney:  Length: 9.1 cm. Increased parenchymal echogenicity. Inferior pole cyst measures 3.1 x 3.1 x 3.0 cm. No mass or hydronephrosis.  Bladder:  Collapsed around a Foley catheter.  Other:  Bilateral pleural effusions noted.  IMPRESSION: 1. No obstructive uropathy. 2. Bilateral renal atrophy and increased parenchymal echogenicity compatible with chronic medical renal disease.   Electronically Signed   By: Kerby Moors M.D.   On: 08/31/2014 12:57   Nm Pulmonary Perf And Vent  09/13/2014   CLINICAL DATA:  Shortness of breath. Syncopal episode during dialysis rib wiring chest compressions. Chest pain.  EXAM: NUCLEAR MEDICINE VENTILATION - PERFUSION LUNG SCAN  TECHNIQUE: Ventilation images were obtained in multiple projections using inhaled aerosol technetium 99 M DTPA. Perfusion images were obtained in multiple projections after intravenous injection of Tc-57mMAA.  RADIOPHARMACEUTICALS:  40.0 mCi Tc-936mTPA aerosol and 6.0 mCi Tc-9935mA  COMPARISON:  Two-view chest x-ray 09/12/2014.  FINDINGS: Ventilation: There is some clumping of the radiotracer about the left hilum. This is likely artifactual. No defects are evident.  Perfusion: No wedge shaped peripheral perfusion defects to suggest acute pulmonary embolism.  IMPRESSION: No evidence for pulmonary embolism.   Electronically Signed   By: ChrLawrence SantiagoD.   On: 09/13/2014  10:38   Dg Chest Port 1 View  09/06/2014   CLINICAL DATA:  Status post diatek catheter placement.  EXAM: PORTABLE CHEST - 1 VIEW  COMPARISON:  09/04/2014  FINDINGS: The right IJ diatek catheter is in good  position. The distal tip is at the cavoatrial junction and the proximal tip is in the distal SVC. No pneumothorax or hematoma. The lungs show improved aeration when compared to prior chest film. There is persistent perihilar pulmonary edema and vascular congestion. No definite pleural effusions.  IMPRESSION: Right IJ dialysis catheter in good position without complicating features.  Improved aeration since prior study.   Electronically Signed   By: Kalman Jewels M.D.   On: 09/06/2014 13:21   Dg Chest Port 1 View  09/04/2014   CLINICAL DATA:  81 year old female with shortness of Breath. Initial encounter.  EXAM: PORTABLE CHEST - 1 VIEW  COMPARISON:  09/03/2014 and earlier.  FINDINGS: Portable AP semi upright view at 0702 hrs. Increased basilar predominant interstitial opacity and obscuration of the diaphragm. Increased veiling opacity at both lung bases. Stable cardiac size and mediastinal contours. No pneumothorax. Visualized tracheal air column is within normal limits.  IMPRESSION: Progressed pulmonary edema, bilateral pleural effusions, and bibasilar atelectasis.   Electronically Signed   By: Lars Pinks M.D.   On: 09/04/2014 07:21   Dg Chest Port 1 View  09/01/2014   CLINICAL DATA:  Shortness of breath. Stage IV renal disease due to diabetes.  EXAM: PORTABLE CHEST - 1 VIEW  COMPARISON:  08/31/2014  FINDINGS: Normal heart size. Pulmonary vascularity is increasing since prior study with development of slight interstitial pattern in the lung bases. This suggest developing edema. Small left pleural effusion is also developing. Calcification of aorta. No pneumothorax. Mediastinal contours are intact.  IMPRESSION: Developing pulmonary vascular congestion edema with small left pleural effusion.   Electronically Signed   By: Lucienne Capers M.D.   On: 09/01/2014 22:27   Dg Fluoro Guide Cv Line-no Report  09/06/2014   CLINICAL DATA:    FLOURO GUIDE CV LINE  Fluoroscopy was utilized by the requesting physician.   No radiographic  interpretation.      A/P: 81 y.o. female with :   Thrombocytopenia: At this point the cause is unknown. However, this may be related to cardiorespiratory arrest sustained on 09/12/14 requiring cardiac catheterization on 09/17/2014 Platelets on admission were 49, improved since then, currently at 121,000 HIT panel on 2/4 was indeterminate at 1.197. Hepatitis panel is negative despite elevation of liver enzymes.  Anemia of chronic disease Her hemoglobin on admission was 7.2 requiring 2 units of blood on 09/14/2014 with good response. Hemoccult is negative. The patient is on  Aranesp and iron IV as per renal service   Other medical issues as per admitting team.   **Disclaimer: This note was dictated with voice recognition software. Similar sounding words can inadvertently be transcribed and this note may contain transcription errors which may not have been corrected upon publication of note.Sharene Butters E, PA-C 09/18/2014 12:31 PM   Patient seen and examined please see the note details the history of this current admission. As a pleasant 81 year old woman admitted after cardiovascular rest and found to have a platelet count of close to 49,000. Heparin products were discontinued and her platelet count now have normalized. Her laboratory testing showed a hit screening panel to be indeterminate. Serotonin releasing assay showed positive results.  Clinically, patient is asymptomatic at this time. He does not report any chest pain or  difficulty breathing. He is not report any cough or hemoptysis. Has not felt atraumatic pupils equal round reactive to light extremity moist and pink Lymphadenopathy heart is regular rate and rhythm S1-S2 lungs are clear abdomen was soft. Extremities had no edema. Skin showed no ecchymosis or petechiae.  Laboratory data were reviewed personally and there detailed above.  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 The serotonin releasing assay confirmed these findings. It is unclear to me whether her cardiovascular collapse could be related to that as an acute vascular thrombosis precipitated.  Given the high likelihood of heparin-induced thrombocytopenia, to discontinue all heparin products permanently. I recommend full dose anticoagulation with a non-heparin based anticoagulants: Preferably Argatroban given her renal function unless there is a strong contraindication for anticoagulation. This can be used as a bridge with warfarin until she is fully therapeutic on warfarin.  I recommend anticoagulation with warfarin for at least 3 months given the fact that she has no thrombosis longer anticoagulation will not be needed.  [facility] MD 09/18/2014

## 2014-09-20 LAB — SEROTONIN RELEASE ASSAY (SRA)
SRA .2 IU/mL UFH Ser-aCnc: 89 % — ABNORMAL HIGH (ref 0–20)
SRA, HIGH DOSE HEPARIN: 1 % (ref 0–20)

## 2014-09-20 LAB — HEPARIN INDUCED THROMBOCYTOPENIA PNL: HEPARIN INDUCED PLT AB: 2.949 {OD_unit} — AB (ref 0.000–0.400)

## 2014-10-02 ENCOUNTER — Ambulatory Visit: Payer: Medicare Other | Admitting: Physician Assistant

## 2014-10-07 DIAGNOSIS — D631 Anemia in chronic kidney disease: Secondary | ICD-10-CM

## 2014-10-07 DIAGNOSIS — N186 End stage renal disease: Secondary | ICD-10-CM | POA: Diagnosis not present

## 2014-10-07 DIAGNOSIS — I469 Cardiac arrest, cause unspecified: Secondary | ICD-10-CM | POA: Diagnosis not present

## 2014-10-07 DIAGNOSIS — I120 Hypertensive chronic kidney disease with stage 5 chronic kidney disease or end stage renal disease: Secondary | ICD-10-CM | POA: Diagnosis not present

## 2014-10-07 DIAGNOSIS — E1122 Type 2 diabetes mellitus with diabetic chronic kidney disease: Secondary | ICD-10-CM

## 2014-10-07 DIAGNOSIS — M6281 Muscle weakness (generalized): Secondary | ICD-10-CM | POA: Diagnosis not present

## 2014-10-07 DIAGNOSIS — Z9181 History of falling: Secondary | ICD-10-CM

## 2014-10-07 DIAGNOSIS — I739 Peripheral vascular disease, unspecified: Secondary | ICD-10-CM

## 2014-10-07 DIAGNOSIS — Z720 Tobacco use: Secondary | ICD-10-CM

## 2014-10-15 ENCOUNTER — Ambulatory Visit: Payer: Medicare Other | Admitting: Cardiology

## 2014-10-16 ENCOUNTER — Telehealth: Payer: Self-pay | Admitting: Cardiology

## 2014-10-17 NOTE — Telephone Encounter (Signed)
Closed encounter °

## 2014-10-22 ENCOUNTER — Ambulatory Visit: Payer: Medicare Other | Admitting: Internal Medicine

## 2014-10-23 ENCOUNTER — Encounter: Payer: Self-pay | Admitting: Internal Medicine

## 2014-10-23 ENCOUNTER — Ambulatory Visit (INDEPENDENT_AMBULATORY_CARE_PROVIDER_SITE_OTHER): Payer: Medicare Other | Admitting: Internal Medicine

## 2014-10-23 VITALS — BP 178/72 | HR 72 | Temp 98.0°F | Ht 65.0 in | Wt 127.4 lb

## 2014-10-23 DIAGNOSIS — IMO0002 Reserved for concepts with insufficient information to code with codable children: Secondary | ICD-10-CM

## 2014-10-23 DIAGNOSIS — D638 Anemia in other chronic diseases classified elsewhere: Secondary | ICD-10-CM | POA: Diagnosis not present

## 2014-10-23 DIAGNOSIS — E1129 Type 2 diabetes mellitus with other diabetic kidney complication: Secondary | ICD-10-CM | POA: Diagnosis not present

## 2014-10-23 DIAGNOSIS — I10 Essential (primary) hypertension: Secondary | ICD-10-CM | POA: Diagnosis not present

## 2014-10-23 DIAGNOSIS — N186 End stage renal disease: Secondary | ICD-10-CM

## 2014-10-23 DIAGNOSIS — I420 Dilated cardiomyopathy: Secondary | ICD-10-CM | POA: Diagnosis not present

## 2014-10-23 DIAGNOSIS — E785 Hyperlipidemia, unspecified: Secondary | ICD-10-CM

## 2014-10-23 DIAGNOSIS — R630 Anorexia: Secondary | ICD-10-CM | POA: Insufficient documentation

## 2014-10-23 DIAGNOSIS — R0602 Shortness of breath: Secondary | ICD-10-CM

## 2014-10-23 DIAGNOSIS — E1165 Type 2 diabetes mellitus with hyperglycemia: Secondary | ICD-10-CM | POA: Diagnosis not present

## 2014-10-23 MED ORDER — MIRTAZAPINE 15 MG PO TABS: ORAL_TABLET | ORAL | Status: DC

## 2014-10-23 MED ORDER — HYDRALAZINE HCL 25 MG PO TABS: 25.0000 mg | ORAL_TABLET | Freq: Three times a day (TID) | ORAL | Status: DC

## 2014-10-23 MED ORDER — AMLODIPINE BESYLATE 10 MG PO TABS: ORAL_TABLET | ORAL | Status: DC

## 2014-10-23 NOTE — Progress Notes (Signed)
Patient ID: Natalie Wolfe, female   DOB: 04/14/1930, 81 y.o.   MRN: [id number]    Facility  [facility]    Place of Service:   OFFICE   Allergies  Allergen Reactions  . Heparin Other (See Comments)    Positive Hep Induced Plt Ab and SRA 09/12/2014    Chief Complaint  Patient presents with  . Hospitalization Follow-up    Hospital Follow up. Was Admitted in February due to Cardiac Arrest.    HPI:  Hospitalized 09/12/14 through 09/18/2014 because of a cardiopulmonary arrest while at dialysis.  Had been previously hospitalized 08/31/2014 through 09/09/2014. At the time of admission she been complaining of bilateral weakness in the legs and then her overall weakness. She fell after her knees gave out. She had not been able to eat well for the last month and had trouble eating following removal of several teeth. Creatinine was found to be elevated to 9 from a previous value of about 2. Acute kidney injury was diagnosed and she was started on hemodialysis.  Anorectic.  Medications: Patient's Medications  New Prescriptions   No medications on file  Previous Medications   ACETAMINOPHEN (TYLENOL) 325 MG TABLET    Take 650 mg by mouth daily as needed for mild pain.    AMLODIPINE (NORVASC) 10 MG TABLET    Take one tablet by mouth once daily for blood pressure   AMLODIPINE (NORVASC) 5 MG TABLET    Take one tablet by mouth once daily for blood pressure   ASPIRIN EC 81 MG EC TABLET    Take 1 tablet (81 mg total) by mouth daily.   CALCIUM ACETATE (PHOSLO) 667 MG CAPSULE    Take 1 capsule (667 mg total) by mouth 3 (three) times daily with meals.   CARVEDILOL (COREG) 6.25 MG TABLET    Take 1 tablet (6.25 mg total) by mouth 2 (two) times daily with a meal.   FLUOCINONIDE CREAM (LIDEX) 0.05 %    Apply 1 application topically 2 (two) times daily. Apply from neck down twice a day (provided by dermatology).   GLIMEPIRIDE (AMARYL) 2 MG TABLET    Take 1/2 tablet by mouth once daily to control blood sugar   GLUCOSE BLOOD (BAYER CONTOUR NEXT TEST) TEST STRIP    Use as instructed   HYDRALAZINE (APRESOLINE) 25 MG TABLET    Take 1 tablet (25 mg total) by mouth every 8 (eight) hours.   HYDROCORTISONE 2.5 % LOTION    Apply 1 application topically 2 (two) times daily. Apply to skin and face   ISOSORBIDE MONONITRATE (IMDUR) 30 MG 24 HR TABLET    Take 1 tablet (30 mg total) by mouth daily.   MULTIVITAMIN (RENA-VIT) TABS TABLET    Take 1 tablet by mouth at bedtime.   NUTRITIONAL SUPPLEMENTS (FEEDING SUPPLEMENT, NEPRO CARB STEADY,) LIQD    Take 237 mLs by mouth as needed (missed meal during dialysis.).   ROSUVASTATIN (CRESTOR) 20 MG TABLET    Take one tablet by mouth once daily for cholesterol   VESICARE 5 MG TABLET    Take 5 mg by mouth daily.  Modified Medications   No medications on file  Discontinued Medications   No medications on file     Review of Systems  Constitutional: Positive for fatigue. Negative for fever, chills, activity change and appetite change.       This lost a lot of weight since I last saw the patient. This is related to anorexia and extended period of time  in the hospital prior to this visit.  HENT: Negative for congestion and mouth sores.        Dentures present  Eyes:       Corrective lenses  Respiratory: Negative for cough, chest tightness and shortness of breath.   Cardiovascular: Positive for leg swelling. Negative for chest pain.       Cardiopulmonary arrest in February 2016  Gastrointestinal: Negative for abdominal pain and constipation.  Endocrine:       Diabetic with renal disease.  Genitourinary: Positive for urgency and frequency. Negative for dysuria and flank pain.  Skin:       Chronic eczema causes itching  Neurological: Negative for dizziness, syncope and light-headedness.  Hematological: Negative for adenopathy.  Psychiatric/Behavioral: Negative for behavioral problems and agitation.    Filed Vitals:   10/23/14 1506  BP: 178/72  Pulse: 72  Temp: 98 F  (36.7 C)  TempSrc: Oral  Height:  (1.651 m)  Weight: 127 lb 6.4 oz (57.788 kg)  SpO2: 97%   Body mass index is 21.2 kg/(m^2).  Physical Exam  Constitutional: She is oriented to person, place, and time. No distress.  Frail, elderly female  HENT:  Head: Normocephalic and atraumatic.  Mouth/Throat: Oropharynx is clear and moist. No oropharyngeal exudate.  Upper and lower dentures  Eyes: Conjunctivae and EOM are normal. Pupils are equal, round, and reactive to light.  Neck: Normal range of motion. Neck supple. No JVD present. No tracheal deviation present. No thyromegaly present.  Cardiovascular: Normal rate, regular rhythm and normal heart sounds.   No murmur heard. Pulmonary/Chest: Effort normal and breath sounds normal. No respiratory distress. She has no wheezes. She has no rales.  Abdominal: Soft. Bowel sounds are normal. She exhibits no distension and no mass. There is no tenderness.  Musculoskeletal: Normal range of motion. She exhibits edema (2+ both feet). She exhibits no tenderness.  Lymphadenopathy:    She has no cervical adenopathy.  Neurological: She is alert and oriented to person, place, and time. No cranial nerve deficit. Coordination normal.  Diminished sensation to vibration and monofilament testing bilaterally.  Skin: Skin is warm and dry. No rash noted. She is not diaphoretic.  Chronic eczematous changes. Thinning of skin.  Psychiatric: She has a normal mood and affect. Her behavior is normal.     Labs reviewed: Admission on 09/12/2014, Discharged on 09/18/2014  No results displayed because visit has over 200 results.    Admission on 08/31/2014, Discharged on 09/09/2014  No results displayed because visit has over 200 results.    Appointment on 08/29/2014  Component Date Value Ref Range Status  . WBC 08/29/2014 8.5  3.4 - 10.8 x10E3/uL Final  . RBC 08/29/2014 3.71* 3.77 - 5.28 x10E6/uL Final  . Hemoglobin 08/29/2014 9.3* 11.1 - 15.9 g/dL Final  . HCT  65/78/4696 28.6* 34.0 - 46.6 % Final  . MCV 08/29/2014 77* 79 - 97 fL Final  . MCH 08/29/2014 25.1* 26.6 - 33.0 pg Final  . MCHC 08/29/2014 32.5  31.5 - 35.7 g/dL Final  . RDW 29/52/8413 18.3* 12.3 - 15.4 % Final  . Platelets 08/29/2014 212  150 - 379 x10E3/uL Final  . Neutrophils Relative % 08/29/2014 86   Final  . Lymphs 08/29/2014 7   Final  . Monocytes 08/29/2014 6   Final  . Eos 08/29/2014 1   Final  . Basos 08/29/2014 0   Final  . Neutrophils Absolute 08/29/2014 7.2* 1.4 - 7.0 x10E3/uL Final  . Lymphocytes Absolute  08/29/2014 0.6* 0.7 - 3.1 x10E3/uL Final  . Monocytes Absolute 08/29/2014 0.5  0.1 - 0.9 x10E3/uL Final  . Eosinophils Absolute 08/29/2014 0.1  0.0 - 0.4 x10E3/uL Final  . Basophils Absolute 08/29/2014 0.0  0.0 - 0.2 x10E3/uL Final  . Immature Granulocytes 08/29/2014 0   Final  . Immature Grans (Abs) 08/29/2014 0.0  0.0 - 0.1 x10E3/uL Final  . Glucose 08/29/2014 92  65 - 99 mg/dL Final  . BUN 16/10/960401/21/2016 95* 8 - 27 mg/dL Final  . Creatinine, Ser 08/29/2014 9.90* 0.57 - 1.00 mg/dL Final  . GFR calc non Af Amer 08/29/2014 3* >59 mL/min/1.73 Final  . GFR calc Af Amer 08/29/2014 4* >59 mL/min/1.73 Final  . BUN/Creatinine Ratio 08/29/2014 10* 11 - 26 Final  . Sodium 08/29/2014 134  134 - 144 mmol/L Final  . Potassium 08/29/2014 5.9* 3.5 - 5.2 mmol/L Final  . Chloride 08/29/2014 98  97 - 108 mmol/L Final  . CO2 08/29/2014 12* 18 - 29 mmol/L Final   **Verified by repeat analysis**  . Calcium 08/29/2014 8.8  8.7 - 10.3 mg/dL Final  . Total Protein 08/29/2014 6.8  6.0 - 8.5 g/dL Final  . Albumin 54/09/811901/21/2016 3.5  3.5 - 4.7 g/dL Final  . Globulin, Total 08/29/2014 3.3  1.5 - 4.5 g/dL Final  . Albumin/Globulin Ratio 08/29/2014 1.1  1.1 - 2.5 Final  . Total Bilirubin 08/29/2014 0.3  0.0 - 1.2 mg/dL Final  . Alkaline Phosphatase 08/29/2014 92  39 - 117 IU/L Final  . AST 08/29/2014 14  0 - 40 IU/L Final  . ALT 08/29/2014 5  0 - 32 IU/L Final  . Hgb A1c MFr Bld 08/29/2014 4.7*  4.8 - 5.6 % Final   Comment:          Pre-diabetes: 5.7 - 6.4          Diabetes: >6.4          Glycemic control for adults with diabetes: <7.0   . Est. average glucose Bld gHb Est-m* 08/29/2014 88   Final  . Cholesterol, Total 08/29/2014 187  100 - 199 mg/dL Final  . Triglycerides 08/29/2014 115  0 - 149 mg/dL Final  . HDL 14/78/295601/21/2016 52  >39 mg/dL Final   Comment: According to ATP-III Guidelines, HDL-C >59 mg/dL is considered a negative risk factor for CHD.   Marland Kitchen. VLDL Cholesterol Cal 08/29/2014 23  5 - 40 mg/dL Final  . LDL Calculated 08/29/2014 213112* 0 - 99 mg/dL Final  . Chol/HDL Ratio 08/29/2014 3.6  0.0 - 4.4 ratio units Final   Comment:                                   T. Chol/HDL Ratio                                             Men  Women                               1/2 Avg.Risk  3.4    3.3                                   Avg.Risk  5.0    4.4                                2X Avg.Risk  9.6    7.1                                3X Avg.Risk 23.4   11.0      Assessment/Plan  1. ESRD (end stage renal disease) Now on dialysis. She has a shunt in her left arm. This is not being used yet and she is getting dialysis through a portal in the right upper anterior chest.  2. DM type 2, uncontrolled, with renal complications Controlled - Comprehensive metabolic panel; Future - Hemoglobin A1c; Future  3. Anemia of chronic disease - CBC With Differential; Future  4. Essential hypertension Elevation in systolic blood pressure. Continue amlodipine at 10 mg daily and resume taking hydralazine. - hydrALAZINE (APRESOLINE) 25 MG tablet; Take 1 tablet (25 mg total) by mouth every 8 (eight) hours.  Dispense: 90 tablet; Refill: 3 - amLODipine (NORVASC) 10 MG tablet; Take one tablet by mouth once daily for blood pressure  Dispense: 90 tablet; Refill: 3 - Comprehensive metabolic panel; Future  5. Hyperlipemia Continue Crestor 20 mg - Lipid panel; Future  6. Congestive dilated  cardiomyopathy-EF 40-45% at cath, 35% by echo on 09/13/14 Compensated  7. SOB (shortness of breath) Improved  8. Anorexia - mirtazapine (REMERON) 15 MG tablet; One nightly to help appetite  Dispense: 30 tablet; Refill: 3

## 2014-10-30 ENCOUNTER — Ambulatory Visit: Payer: Medicare Other | Admitting: Cardiology

## 2014-11-07 ENCOUNTER — Other Ambulatory Visit: Payer: Self-pay | Admitting: *Deleted

## 2014-11-07 DIAGNOSIS — R630 Anorexia: Secondary | ICD-10-CM

## 2014-11-07 DIAGNOSIS — I10 Essential (primary) hypertension: Secondary | ICD-10-CM

## 2014-11-07 MED ORDER — HYDRALAZINE HCL 25 MG PO TABS: 25.0000 mg | ORAL_TABLET | Freq: Three times a day (TID) | ORAL | Status: AC

## 2014-11-07 MED ORDER — MIRTAZAPINE 15 MG PO TABS: ORAL_TABLET | ORAL | Status: DC

## 2014-11-07 NOTE — Telephone Encounter (Signed)
[facility] 

## 2014-11-20 ENCOUNTER — Ambulatory Visit: Payer: Medicare Other | Admitting: Cardiology

## 2014-12-16 ENCOUNTER — Ambulatory Visit: Payer: Medicare Other | Admitting: Cardiology

## 2014-12-21 ENCOUNTER — Encounter (HOSPITAL_COMMUNITY): Payer: Self-pay | Admitting: Emergency Medicine

## 2014-12-21 ENCOUNTER — Inpatient Hospital Stay (HOSPITAL_COMMUNITY)
Admission: EM | Admit: 2014-12-21 | Discharge: 2014-12-24 | DRG: 312 | Disposition: A | Discharge status: Home / Self Care | Payer: Medicare Other | Attending: Family Medicine | Admitting: Family Medicine

## 2014-12-21 DIAGNOSIS — E1122 Type 2 diabetes mellitus with diabetic chronic kidney disease: Secondary | ICD-10-CM | POA: Diagnosis present

## 2014-12-21 DIAGNOSIS — E1129 Type 2 diabetes mellitus with other diabetic kidney complication: Secondary | ICD-10-CM | POA: Diagnosis present

## 2014-12-21 DIAGNOSIS — I252 Old myocardial infarction: Secondary | ICD-10-CM

## 2014-12-21 DIAGNOSIS — R55 Syncope and collapse: Secondary | ICD-10-CM | POA: Diagnosis not present

## 2014-12-21 DIAGNOSIS — Z888 Allergy status to other drugs, medicaments and biological substances status: Secondary | ICD-10-CM

## 2014-12-21 DIAGNOSIS — I120 Hypertensive chronic kidney disease with stage 5 chronic kidney disease or end stage renal disease: Secondary | ICD-10-CM | POA: Diagnosis present

## 2014-12-21 DIAGNOSIS — D638 Anemia in other chronic diseases classified elsewhere: Secondary | ICD-10-CM

## 2014-12-21 DIAGNOSIS — E43 Unspecified severe protein-calorie malnutrition: Secondary | ICD-10-CM | POA: Diagnosis present

## 2014-12-21 DIAGNOSIS — Z992 Dependence on renal dialysis: Secondary | ICD-10-CM

## 2014-12-21 DIAGNOSIS — Z90710 Acquired absence of both cervix and uterus: Secondary | ICD-10-CM

## 2014-12-21 DIAGNOSIS — I420 Dilated cardiomyopathy: Secondary | ICD-10-CM | POA: Diagnosis present

## 2014-12-21 DIAGNOSIS — J811 Chronic pulmonary edema: Secondary | ICD-10-CM

## 2014-12-21 DIAGNOSIS — N186 End stage renal disease: Secondary | ICD-10-CM

## 2014-12-21 DIAGNOSIS — Z9841 Cataract extraction status, right eye: Secondary | ICD-10-CM

## 2014-12-21 DIAGNOSIS — E1165 Type 2 diabetes mellitus with hyperglycemia: Secondary | ICD-10-CM | POA: Diagnosis present

## 2014-12-21 DIAGNOSIS — D696 Thrombocytopenia, unspecified: Secondary | ICD-10-CM | POA: Diagnosis present

## 2014-12-21 DIAGNOSIS — Z7982 Long term (current) use of aspirin: Secondary | ICD-10-CM

## 2014-12-21 DIAGNOSIS — R0609 Other forms of dyspnea: Secondary | ICD-10-CM

## 2014-12-21 DIAGNOSIS — D631 Anemia in chronic kidney disease: Secondary | ICD-10-CM | POA: Diagnosis present

## 2014-12-21 DIAGNOSIS — N2581 Secondary hyperparathyroidism of renal origin: Secondary | ICD-10-CM | POA: Diagnosis present

## 2014-12-21 DIAGNOSIS — F419 Anxiety disorder, unspecified: Secondary | ICD-10-CM | POA: Diagnosis present

## 2014-12-21 DIAGNOSIS — Z681 Body mass index (BMI) 19 or less, adult: Secondary | ICD-10-CM

## 2014-12-21 DIAGNOSIS — IMO0002 Reserved for concepts with insufficient information to code with codable children: Secondary | ICD-10-CM | POA: Diagnosis present

## 2014-12-21 DIAGNOSIS — I10 Essential (primary) hypertension: Secondary | ICD-10-CM

## 2014-12-21 DIAGNOSIS — Z8674 Personal history of sudden cardiac arrest: Secondary | ICD-10-CM

## 2014-12-21 DIAGNOSIS — T50995A Adverse effect of other drugs, medicaments and biological substances, initial encounter: Secondary | ICD-10-CM | POA: Diagnosis present

## 2014-12-21 DIAGNOSIS — E785 Hyperlipidemia, unspecified: Secondary | ICD-10-CM | POA: Diagnosis present

## 2014-12-21 DIAGNOSIS — R0602 Shortness of breath: Secondary | ICD-10-CM

## 2014-12-21 DIAGNOSIS — Z87891 Personal history of nicotine dependence: Secondary | ICD-10-CM

## 2014-12-21 DIAGNOSIS — I739 Peripheral vascular disease, unspecified: Secondary | ICD-10-CM | POA: Diagnosis present

## 2014-12-21 DIAGNOSIS — I509 Heart failure, unspecified: Secondary | ICD-10-CM | POA: Diagnosis present

## 2014-12-21 DIAGNOSIS — Z09 Encounter for follow-up examination after completed treatment for conditions other than malignant neoplasm: Secondary | ICD-10-CM

## 2014-12-21 LAB — BASIC METABOLIC PANEL
Anion gap: 11 (ref 5–15)
BUN: 39 mg/dL — ABNORMAL HIGH (ref 6–20)
CO2: 28 mmol/L (ref 22–32)
Calcium: 8.6 mg/dL — ABNORMAL LOW (ref 8.9–10.3)
Chloride: 92 mmol/L — ABNORMAL LOW (ref 101–111)
Creatinine, Ser: 3.08 mg/dL — ABNORMAL HIGH (ref 0.44–1.00)
GFR calc non Af Amer: 13 mL/min — ABNORMAL LOW (ref >60)
GFR, EST AFRICAN AMERICAN: 15 mL/min — AB (ref >60)
Glucose, Bld: 121 mg/dL — ABNORMAL HIGH (ref 65–99)
POTASSIUM: 4.5 mmol/L (ref 3.5–5.1)
Sodium: 131 mmol/L — ABNORMAL LOW (ref 135–145)

## 2014-12-21 LAB — CBC WITH DIFFERENTIAL/PLATELET
Basophils Absolute: 0 10*3/uL (ref 0.0–0.1)
Basophils Relative: 1 % (ref 0–1)
Eosinophils Absolute: 0.7 10*3/uL (ref 0.0–0.7)
Eosinophils Relative: 9 % — ABNORMAL HIGH (ref 0–5)
HCT: 31.6 % — ABNORMAL LOW (ref 36.0–46.0)
HEMOGLOBIN: 10.5 g/dL — AB (ref 12.0–15.0)
LYMPHS ABS: 0.6 10*3/uL — AB (ref 0.7–4.0)
Lymphocytes Relative: 8 % — ABNORMAL LOW (ref 12–46)
MCH: 31.7 pg (ref 26.0–34.0)
MCHC: 33.2 g/dL (ref 30.0–36.0)
MCV: 95.5 fL (ref 78.0–100.0)
MONO ABS: 1.1 10*3/uL — AB (ref 0.1–1.0)
MONOS PCT: 14 % — AB (ref 3–12)
NEUTROS ABS: 5.4 10*3/uL (ref 1.7–7.7)
NEUTROS PCT: 68 % (ref 43–77)
PLATELETS: 110 10*3/uL — AB (ref 150–400)
RBC: 3.31 MIL/uL — ABNORMAL LOW (ref 3.87–5.11)
RDW: 18 % — ABNORMAL HIGH (ref 11.5–15.5)
WBC: 7.9 10*3/uL (ref 4.0–10.5)

## 2014-12-21 LAB — GLUCOSE, CAPILLARY: Glucose-Capillary: 119 mg/dL — ABNORMAL HIGH (ref 65–99)

## 2014-12-21 LAB — TROPONIN I: TROPONIN I: 0.07 ng/mL — AB (ref <0.031)

## 2014-12-21 MED ORDER — ACETAMINOPHEN 650 MG RE SUPP: 650.0000 mg | Freq: Four times a day (QID) | RECTAL | Status: DC | PRN

## 2014-12-21 MED ORDER — ACETAMINOPHEN 325 MG PO TABS: 650.0000 mg | ORAL_TABLET | Freq: Four times a day (QID) | ORAL | Status: DC | PRN

## 2014-12-21 MED ORDER — CARVEDILOL 6.25 MG PO TABS
6.2500 mg | ORAL_TABLET | Freq: Two times a day (BID) | ORAL | Status: DC
  Administered 2014-12-22 – 2014-12-23 (×3): 6.25 mg via ORAL
  Filled 2014-12-21 (×7): qty 1

## 2014-12-21 MED ORDER — INSULIN ASPART 100 UNIT/ML ~~LOC~~ SOLN: 0.0000 [IU] | Freq: Every day | SUBCUTANEOUS | Status: DC

## 2014-12-21 MED ORDER — INSULIN ASPART 100 UNIT/ML ~~LOC~~ SOLN: 0.0000 [IU] | Freq: Three times a day (TID) | SUBCUTANEOUS | Status: DC

## 2014-12-21 MED ORDER — HYDRALAZINE HCL 25 MG PO TABS
25.0000 mg | ORAL_TABLET | Freq: Three times a day (TID) | ORAL | Status: DC
  Administered 2014-12-21 – 2014-12-24 (×7): 25 mg via ORAL
  Filled 2014-12-21 (×11): qty 1

## 2014-12-21 MED ORDER — ONDANSETRON HCL 4 MG/2ML IJ SOLN: 4.0000 mg | Freq: Four times a day (QID) | INTRAMUSCULAR | Status: DC | PRN

## 2014-12-21 MED ORDER — ASPIRIN EC 81 MG PO TBEC
81.0000 mg | DELAYED_RELEASE_TABLET | Freq: Every day | ORAL | Status: DC
  Administered 2014-12-22 – 2014-12-24 (×3): 81 mg via ORAL
  Filled 2014-12-21 (×3): qty 1

## 2014-12-21 MED ORDER — MIRTAZAPINE 15 MG PO TABS
15.0000 mg | ORAL_TABLET | Freq: Every day | ORAL | Status: DC
  Administered 2014-12-21 – 2014-12-23 (×3): 15 mg via ORAL
  Filled 2014-12-21 (×4): qty 1

## 2014-12-21 MED ORDER — NEPRO/CARBSTEADY PO LIQD
237.0000 mL | Freq: Two times a day (BID) | ORAL | Status: DC
  Administered 2014-12-22 – 2014-12-23 (×3): 237 mL via ORAL

## 2014-12-21 MED ORDER — ONDANSETRON HCL 4 MG PO TABS: 4.0000 mg | ORAL_TABLET | Freq: Four times a day (QID) | ORAL | Status: DC | PRN

## 2014-12-21 MED ORDER — ISOSORBIDE MONONITRATE ER 30 MG PO TB24
30.0000 mg | ORAL_TABLET | Freq: Every day | ORAL | Status: DC
  Administered 2014-12-22 – 2014-12-24 (×3): 30 mg via ORAL
  Filled 2014-12-21 (×3): qty 1

## 2014-12-21 MED ORDER — CALCIUM ACETATE 667 MG PO CAPS: 667.0000 mg | ORAL_CAPSULE | Freq: Three times a day (TID) | ORAL | Status: DC

## 2014-12-21 MED ORDER — AMLODIPINE BESYLATE 5 MG PO TABS
5.0000 mg | ORAL_TABLET | Freq: Every day | ORAL | Status: DC
  Administered 2014-12-22: 5 mg via ORAL
  Filled 2014-12-21: qty 1

## 2014-12-21 MED ORDER — SODIUM CHLORIDE 0.9 % IJ SOLN
3.0000 mL | Freq: Two times a day (BID) | INTRAMUSCULAR | Status: DC
  Administered 2014-12-21: 3 mL via INTRAVENOUS

## 2014-12-21 NOTE — Progress Notes (Signed)
Left diaysis needles removed.  Pressure held about 15 minutes.  No bleeding noted.  Pressure dressing applied

## 2014-12-21 NOTE — ED Provider Notes (Signed)
CSN: [id number]     Arrival date & time 12/21/14  1552 History   First MD Initiated Contact with Patient 12/21/14 1712     Chief Complaint  Patient presents with  . Loss of Consciousness     (Consider location/radiation/quality/duration/timing/severity/associated sxs/prior Treatment) HPI Comments: [age] year old female with past medical history of hypertension, diabetes, CHF, recently diagnosed ESRD just started on HD comes in with cc of syncope. Pt reports that post HD she was just waiting for her ride and passed out. Next thing she recalls is RN helping her and calling for help. She had no prodromal symptoms - no chest pain, nausea, diophoresis, palpitations. No new meds. Pt has no hx of PE, DVT and denies any exogenous estrogen use, long distance travels or surgery in the past 6 weeks, active cancer, recent immobilization.   Patient is a 81 y.o. female presenting with syncope. The history is provided by the patient.  Loss of Consciousness Associated symptoms: no chest pain, no headaches, no nausea, no shortness of breath and no vomiting     Past Medical History  Diagnosis Date  . Urinary frequency   . Routine general medical examination at a health care facility   . Other atopic dermatitis and related conditions   . Rash and other nonspecific skin eruption   . Other anxiety states   . Insomnia, unspecified   . Chronic kidney disease, stage II (mild)   . Type II or unspecified type diabetes mellitus with renal manifestations, not stated as uncontrolled   . PVD (peripheral vascular disease)   . Unspecified vitamin D deficiency   . Anemia, unspecified   . Obesity, unspecified   . Nocturia   . Pain in joint, lower leg   . Other and unspecified hyperlipidemia   . Unspecified essential hypertension   . Cataract    Past Surgical History  Procedure Laterality Date  . Abdominal hysterectomy  1970  . Breast surgery  1979    nodule removed  . Eye surgery  2004    catarcts removed  from right eye.  . Multiple tooth extractions  09/2013    Remonve remaining 3 teeth, no with dentures   . Av fistula placement Left 09/06/2014    Procedure: CREATION OF LEFT UPPER ARM ARTERIOVENOUS (AV) FISTULA ;  Surgeon: Pryor OchoaJames D Lawson, MD;  [facility];  Service: Vascular;  Laterality: Left;  . Insertion of dialysis catheter N/A 09/06/2014    Procedure: INSERTION OF DIALYSIS CATHETER RIGHT INTERNAL JUGULAR VEIN;  Surgeon: Pryor OchoaJames D Lawson, MD;  [facility];  Service: Vascular;  Laterality: N/A;  . Ligation of competing branches of arteriovenous fistula Left 09/06/2014    Procedure: LIGATION OF COMPETING BRANCHES OF ARTERIOVENOUS FISTULA;  Surgeon: Pryor OchoaJames D Lawson, MD;  [facility];  Service: Vascular;  Laterality: Left;  . Left heart catheterization with coronary angiogram N/A 09/17/2014    Procedure: LEFT HEART CATHETERIZATION WITH CORONARY ANGIOGRAM;  Surgeon: Lennette Biharihomas A Kelly, MD;  [facility];  Service: Cardiovascular;  Laterality: N/A;   Family History  Problem Relation Age of Onset  . Diabetes Mother    History  Substance Use Topics  . Smoking status: Former Smoker    Types: Cigarettes    Quit date: 08/10/2000  . Smokeless tobacco: Not on file  . Alcohol Use: No   OB History    No data available     Review of Systems  Constitutional: Positive for activity change.  Respiratory: Negative for shortness of  breath.   Cardiovascular: Positive for syncope. Negative for chest pain.  Gastrointestinal: Negative for nausea, vomiting and abdominal pain.  Genitourinary: Negative for dysuria.  Musculoskeletal: Negative for neck pain.  Neurological: Positive for syncope. Negative for headaches.  All other systems reviewed and are negative.     Allergies  Heparin  Home Medications   Prior to Admission medications   Medication Sig Start Date End Date Taking? Authorizing Provider  acetaminophen (TYLENOL) 325 MG tablet Take 650 mg by mouth daily as needed for mild  pain.    Yes Historical Provider, MD  amLODipine (NORVASC) 10 MG tablet Take one tablet by mouth once daily for blood pressure 10/23/14  Yes Kimber RelicArthur G Green, MD  aspirin EC 81 MG EC tablet Take 1 tablet (81 mg total) by mouth daily. 09/17/14  Yes Richarda OverlieNayana Abrol, MD  carvedilol (COREG) 6.25 MG tablet Take 6.25 mg by mouth 2 (two) times daily with a meal.   Yes Historical Provider, MD  fluocinonide cream (LIDEX) 0.05 % Apply 1 application topically 2 (two) times daily. Apply from neck down twice a day (provided by dermatology).   Yes Historical Provider, MD  glimepiride (AMARYL) 2 MG tablet Take 1 mg by mouth daily. Take 1/2 tablet by mouth once daily to control blood sugar   Yes Historical Provider, MD  hydrALAZINE (APRESOLINE) 25 MG tablet Take 1 tablet (25 mg total) by mouth every 8 (eight) hours. 11/07/14  Yes Kimber RelicArthur G Green, MD  isosorbide mononitrate (IMDUR) 30 MG 24 hr tablet Take 30 mg by mouth daily.   Yes Historical Provider, MD  labetalol (NORMODYNE) 100 MG tablet Take 100 mg by mouth 2 (two) times daily.   Yes Historical Provider, MD  mirtazapine (REMERON) 15 MG tablet Take One tablet by mouth nightly to help appetite Patient taking differently: Take 15 mg by mouth at bedtime. Take One tablet by mouth nightly to help appetite 11/07/14  Yes Kimber RelicArthur G Green, MD  multivitamin (RENA-VIT) TABS tablet Take 1 tablet by mouth at bedtime. 09/09/14  Yes Belkys A Regalado, MD  calcium acetate (PHOSLO) 667 MG capsule Take 1 capsule (667 mg total) by mouth 3 (three) times daily with meals. Patient not taking: Reported on 12/21/2014 09/09/14   Belkys A Regalado, MD  glucose blood (BAYER CONTOUR NEXT TEST) test strip Use as instructed 11/27/12   Edison Paceathey Miller, RPH-CPP   BP 152/61 mmHg  Pulse 79  Temp(Src) 97.8 F (36.6 C) (Oral)  Resp 21  SpO2 100% Physical Exam  Constitutional: She is oriented to person, place, and time. She appears well-developed and well-nourished.  HENT:  Head: Normocephalic and atraumatic.   Eyes: EOM are normal. Pupils are equal, round, and reactive to light.  Neck: Neck supple.  Cardiovascular: Normal rate and regular rhythm.   Murmur heard. Pulmonary/Chest: Effort normal. No respiratory distress.  Abdominal: Soft. She exhibits no distension. There is no tenderness. There is no rebound and no guarding.  Musculoskeletal: She exhibits no edema or tenderness.  Neurological: She is alert and oriented to person, place, and time.  Skin: Skin is warm and dry.  Nursing note and vitals reviewed.   ED Course  Procedures (including critical care time) Labs Review Labs Reviewed  CBC WITH DIFFERENTIAL/PLATELET - Abnormal; Notable for the following:    RBC 3.31 (*)    Hemoglobin 10.5 (*)    HCT 31.6 (*)    RDW 18.0 (*)    Platelets 110 (*)    Lymphocytes Relative 8 (*)  Lymphs Abs 0.6 (*)    Monocytes Relative 14 (*)    Monocytes Absolute 1.1 (*)    Eosinophils Relative 9 (*)    All other components within normal limits  BASIC METABOLIC PANEL - Abnormal; Notable for the following:    Sodium 131 (*)    Chloride 92 (*)    Glucose, Bld 121 (*)    BUN 39 (*)    Creatinine, Ser 3.08 (*)    Calcium 8.6 (*)    GFR calc non Af Amer 13 (*)    GFR calc Af Amer 15 (*)    All other components within normal limits  TROPONIN I - Abnormal; Notable for the following:    Troponin I 0.07 (*)    All other components within normal limits    Imaging Review No results found.   EKG Interpretation   Date/Time:  Saturday Dec 21 2014 16:01:39 EDT Ventricular Rate:  87 PR Interval:    QRS Duration: 105 QT Interval:  327 QTC Calculation: 393 R Axis:   126 Text Interpretation:  Normal sinus rhythm Right axis deviation Low  voltage, precordial leads Abnormal R-wave progression, early transition  Abnormal lateral Q waves Nonspecific ST and T wave abnormality No  significant change since last tracing Confirmed by Rhunette Croft, MD, Janey Genta  [phone]) on 12/21/2014 5:12:16 PM      MDM    Final diagnoses:  Syncope and collapse    Pt comes in with cc of syncope.  DDx includes: Orthostatic hypotension Stroke Vertebral artery dissection/stenosis Dysrhythmia PE Vasovagal/neurocardiogenic syncope Aortic stenosis Valvular disorder/Cardiomyopathy Anemia  She was a witnessed syncope post HD. Pt had no cardiac prodrome or any prodrome/aura. No seizure like activity. Exam is non focal, including cardiac and neuro. Pt has CHF hx, and so doesn't clear on SF syncope score, and she has had cardiac arrest in the recent past. No new meds, will admit. VSS and WNL.    Derwood Kaplan, MD 12/21/14 1954

## 2014-12-21 NOTE — H&P (Signed)
[facility] History and Physical  Patient: Natalie Wolfe  MRN: [id number]  DOB: 1929-10-12  DOS: the patient was seen and examined on 12/21/2014 PCP: [name], [name], MD  Referring [facility]: [facility] Chief Complaint: Passing out episode  HPI: Natalie Wolfe is a 81 y.o. female with Past medical history of ESRD on hemodialysis Tuesday Thursday Saturday, anemia of chronic kidney disease, hypertension, peripheral vascular disease, diabetes mellitus. The patient is presenting with complaints of passing out episode. She mentions she was at her baseline when she went for the hemodialysis and everything was fine. She went for the hemodialysis hemodialysis was started. After a while she passed out. Although the patient does not remember anything during the hemodialysis as well as found herself waking up surrounded by the nurses. There was a report that the patient completed a hemodialysis treatment and after the hemodialysis while she was waiting for her right she passed out. No tonic-clonic movement or tongue bite no loss of control of bowel or bladder. No fever no chills no cough no chest pain. She mentions she has been having progressively worsening shortness of breath since her recent hospitalization and has not been worsening. She has chronic leg swelling. She mentions she is compliant with all her medications.  The patient is coming from home. And at her baseline independent for most of her ADL.  Review of Systems: as mentioned in the history of present illness.  A comprehensive review of the other systems is negative.  Past Medical History  Diagnosis Date  . Urinary frequency   . Routine general medical examination at a health care facility   . Other atopic dermatitis and related conditions   . Rash and other nonspecific skin eruption   . Other anxiety states   . Insomnia, unspecified   . Chronic kidney disease, stage II (mild)   . Type II or unspecified type  diabetes mellitus with renal manifestations, not stated as uncontrolled   . PVD (peripheral vascular disease)   . Unspecified vitamin D deficiency   . Anemia, unspecified   . Obesity, unspecified   . Nocturia   . Pain in joint, lower leg   . Other and unspecified hyperlipidemia   . Unspecified essential hypertension   . Cataract    Past Surgical History  Procedure Laterality Date  . Abdominal hysterectomy  1970  . Breast surgery  1979    nodule removed  . Eye surgery  2004    catarcts removed from right eye.  . Multiple tooth extractions  09/2013    Remonve remaining 3 teeth, no with dentures   . Av fistula placement Left 09/06/2014    Procedure: CREATION OF LEFT UPPER ARM ARTERIOVENOUS (AV) FISTULA ;  Surgeon: Pryor OchoaJames D Lawson, MD;  [facility];  Service: Vascular;  Laterality: Left;  . Insertion of dialysis catheter N/A 09/06/2014    Procedure: INSERTION OF DIALYSIS CATHETER RIGHT INTERNAL JUGULAR VEIN;  Surgeon: Pryor OchoaJames D Lawson, MD;  [facility];  Service: Vascular;  Laterality: N/A;  . Ligation of competing branches of arteriovenous fistula Left 09/06/2014    Procedure: LIGATION OF COMPETING BRANCHES OF ARTERIOVENOUS FISTULA;  Surgeon: Pryor OchoaJames D Lawson, MD;  [facility];  Service: Vascular;  Laterality: Left;  . Left heart catheterization with coronary angiogram N/A 09/17/2014    Procedure: LEFT HEART CATHETERIZATION WITH CORONARY ANGIOGRAM;  Surgeon: Lennette Biharihomas A Kelly, MD;  [facility];  Service: Cardiovascular;  Laterality: N/A;   Social History:  reports that she quit  smoking about 14 years ago. Her smoking use included Cigarettes. She does not have any smokeless tobacco history on file. She reports that she does not drink alcohol or use illicit drugs.  Allergies  Allergen Reactions  . Heparin Other (See Comments)    Positive Hep Induced Plt Ab and SRA 09/12/2014    Family History  Problem Relation Age of Onset  . Diabetes Mother     Prior to Admission  medications   Medication Sig Start Date End Date Taking? Authorizing Provider  acetaminophen (TYLENOL) 325 MG tablet Take 650 mg by mouth daily as needed for mild pain.    Yes Historical Provider, MD  amLODipine (NORVASC) 10 MG tablet Take one tablet by mouth once daily for blood pressure 10/23/14  Yes [name], MD  aspirin EC 81 MG EC tablet Take 1 tablet (81 mg total) by mouth daily. 09/17/14  Yes Richarda Overlie, MD  carvedilol (COREG) 6.25 MG tablet Take 6.25 mg by mouth 2 (two) times daily with a meal.   Yes Historical Provider, MD  fluocinonide cream (LIDEX) 0.05 % Apply 1 application topically 2 (two) times daily. Apply from neck down twice a day (provided by dermatology).   Yes Historical Provider, MD  glimepiride (AMARYL) 2 MG tablet Take 1 mg by mouth daily. Take 1/2 tablet by mouth once daily to control blood sugar   Yes Historical Provider, MD  hydrALAZINE (APRESOLINE) 25 MG tablet Take 1 tablet (25 mg total) by mouth every 8 (eight) hours. 11/07/14  Yes [name], MD  isosorbide mononitrate (IMDUR) 30 MG 24 hr tablet Take 30 mg by mouth daily.   Yes Historical Provider, MD  labetalol (NORMODYNE) 100 MG tablet Take 100 mg by mouth 2 (two) times daily.   Yes Historical Provider, MD  mirtazapine (REMERON) 15 MG tablet Take One tablet by mouth nightly to help appetite Patient taking differently: Take 15 mg by mouth at bedtime. Take One tablet by mouth nightly to help appetite 11/07/14  Yes [name], MD  multivitamin (RENA-VIT) TABS tablet Take 1 tablet by mouth at bedtime. 09/09/14  Yes Belkys A Regalado, MD  calcium acetate (PHOSLO) 667 MG capsule Take 1 capsule (667 mg total) by mouth 3 (three) times daily with meals. Patient not taking: Reported on 12/21/2014 09/09/14   Belkys A Regalado, MD  glucose blood (BAYER CONTOUR NEXT TEST) test strip Use as instructed 11/27/12   Edison Pace, RPH-CPP    Physical Exam: Filed Vitals:   12/21/14 1930 12/21/14 1945 12/21/14 2000 12/21/14  2030  BP: 152/61 145/60 145/62 156/70  Pulse: 79 75 76 76  Temp:      TempSrc:      Resp: SpO2: 100% 97% 97% 98%    General: Alert, Awake and Oriented to Time, Place and Person. Appear in mild distress Eyes: PERRL ENT: Oral Mucosa clear moist. Neck: Mild JVD Cardiovascular: S1 and S2 Present, aortic systolic Murmur, Peripheral Pulses Present Respiratory: Bilateral Air entry equal and Decreased,  Bilateral Crackles, no wheezes Abdomen: Bowel Sound present, Soft and non tender Skin: no Rash Extremities: Bilateral Pedal edema, no calf tenderness Neurologic: Grossly no focal neuro deficit.  Labs on Admission:  CBC:  Recent Labs Lab 12/21/14 1724  WBC 7.9  NEUTROABS 5.4  HGB 10.5*  HCT 31.6*  MCV 95.5  PLT 110*    CMP     Component Value Date/Time   NA 131* 12/21/2014 1724   NA 134 08/29/2014  0842   K 4.5 12/21/2014 1724   CL 92* 12/21/2014 1724   CO2 28 12/21/2014 1724   GLUCOSE 121* 12/21/2014 1724   GLUCOSE 92 08/29/2014 0842   BUN 39* 12/21/2014 1724   BUN 95* 08/29/2014 0842   CREATININE 3.08* 12/21/2014 1724   CALCIUM 8.6* 12/21/2014 1724   PROT 5.6* 09/17/2014 0545   PROT 6.8 08/29/2014 0842   ALBUMIN 2.5* 09/17/2014 0545   AST 17 09/17/2014 0545   ALT 9 09/17/2014 0545   ALKPHOS 67 09/17/2014 0545   BILITOT 0.4 09/17/2014 0545   GFRNONAA 13* 12/21/2014 1724   GFRAA 15* 12/21/2014 1724    No results for input(s): LIPASE, AMYLASE in the last 168 hours.   Recent Labs Lab 12/21/14 1724  TROPONINI 0.07*   BNP (last 3 results) No results for input(s): BNP in the last 8760 hours.  ProBNP (last 3 results) No results for input(s): PROBNP in the last 8760 hours.   Radiological Exams on Admission: No results found. EKG: Independently reviewed. normal sinus rhythm, nonspecific ST and T waves changes.  Assessment/Plan Principal Problem:   Syncope Active Problems:   DM type 2, uncontrolled, with renal complications   Essential  hypertension   Anemia of chronic disease   ESRD (end stage renal disease)   Thrombocytopenia   Congestive dilated cardiomyopathy-EF 40-45% at cath, 35% by echo   1. Syncope The patient is presenting with complaints of syncope. Her EKG is unremarkable lab work is also unremarkable. Her exam is also unremarkable. Possibility of orthostatic vasovagal syncope post-hemodialysis cannot be ruled out. She does have history of cardiac arrest. With this the patient will be admitted in the hospital will be observed overnight on telemetry. Orthostatic vitals every shift. Reducing the dose of amlodipine from 10 mg to 5 mg and continuing other antihypertensive medications.  2. ESRD on hemodialysis. Hemodialysis on Tuesday Thursday Saturday. Patient still has some fluid lower border but does not appear to be significantly volume overloaded. Continue close monitoring.  3. Chronic anemia chronic, cytopenia. Continue close monitoring  4. Diabetes mellitus with kidney disease. Placing her on sliding scale. Last hemoglobin A1c in February 5.0.  5. Malnutrition. Weight loss. Continue feeding supplement  Advance goals of care discussion: Full code   DVT Prophylaxis: mechanical compression device due to hypersensitivity Nutrition: renal diet  Disposition: Admitted as observat, telemetry unit.  Author: Lynden OxfordPranav Ayat Drenning, MD [facility] Pager: [phone] 12/21/2014  If 7PM-7AM, please contact night-coverage [email or web address] Password [email or web address]

## 2014-12-21 NOTE — ED Notes (Signed)
Per EMS, pt was at dialysis today when she had an apneic episode lasting about a minute. Upon EMS arrival pt was still slightly lethargic but since has returned to her baseline. NAD at this time. Pt finished 3.5 hours of her 4 hour treatment.

## 2014-12-21 NOTE — ED Notes (Signed)
Phlebotomy at bedside.

## 2014-12-21 NOTE — ED Notes (Signed)
IV team at bedside deaccessing pt.s graft.

## 2014-12-22 ENCOUNTER — Observation Stay (HOSPITAL_COMMUNITY): Payer: Medicare Other

## 2014-12-22 DIAGNOSIS — E1129 Type 2 diabetes mellitus with other diabetic kidney complication: Secondary | ICD-10-CM | POA: Diagnosis not present

## 2014-12-22 DIAGNOSIS — R55 Syncope and collapse: Secondary | ICD-10-CM | POA: Diagnosis not present

## 2014-12-22 DIAGNOSIS — D638 Anemia in other chronic diseases classified elsewhere: Secondary | ICD-10-CM | POA: Diagnosis not present

## 2014-12-22 DIAGNOSIS — I420 Dilated cardiomyopathy: Secondary | ICD-10-CM | POA: Diagnosis not present

## 2014-12-22 LAB — CBC
HEMATOCRIT: 31.1 % — AB (ref 36.0–46.0)
HEMOGLOBIN: 10.2 g/dL — AB (ref 12.0–15.0)
MCH: 31.8 pg (ref 26.0–34.0)
MCHC: 32.8 g/dL (ref 30.0–36.0)
MCV: 96.9 fL (ref 78.0–100.0)
Platelets: 116 10*3/uL — ABNORMAL LOW (ref 150–400)
RBC: 3.21 MIL/uL — AB (ref 3.87–5.11)
RDW: 18.2 % — ABNORMAL HIGH (ref 11.5–15.5)
WBC: 6.2 10*3/uL (ref 4.0–10.5)

## 2014-12-22 LAB — COMPREHENSIVE METABOLIC PANEL
ALK PHOS: 135 U/L — AB (ref 38–126)
ALT: 41 U/L (ref 14–54)
AST: 34 U/L (ref 15–41)
Albumin: 3 g/dL — ABNORMAL LOW (ref 3.5–5.0)
Anion gap: 14 (ref 5–15)
BILIRUBIN TOTAL: 0.8 mg/dL (ref 0.3–1.2)
BUN: 48 mg/dL — AB (ref 6–20)
CHLORIDE: 93 mmol/L — AB (ref 101–111)
CO2: 26 mmol/L (ref 22–32)
CREATININE: 4.03 mg/dL — AB (ref 0.44–1.00)
Calcium: 9 mg/dL (ref 8.9–10.3)
GFR calc Af Amer: 11 mL/min — ABNORMAL LOW (ref >60)
GFR calc non Af Amer: 9 mL/min — ABNORMAL LOW (ref >60)
Glucose, Bld: 88 mg/dL (ref 65–99)
POTASSIUM: 5.3 mmol/L — AB (ref 3.5–5.1)
SODIUM: 133 mmol/L — AB (ref 135–145)
Total Protein: 6.4 g/dL — ABNORMAL LOW (ref 6.5–8.1)

## 2014-12-22 LAB — GLUCOSE, CAPILLARY
GLUCOSE-CAPILLARY: 97 mg/dL (ref 65–99)
Glucose-Capillary: 97 mg/dL (ref 65–99)

## 2014-12-22 LAB — MRSA PCR SCREENING: MRSA by PCR: NEGATIVE

## 2014-12-22 LAB — TROPONIN I: Troponin I: 0.09 ng/mL — ABNORMAL HIGH (ref <0.031)

## 2014-12-22 MED ORDER — RENA-VITE PO TABS
1.0000 | ORAL_TABLET | Freq: Every day | ORAL | Status: DC
  Administered 2014-12-22 – 2014-12-23 (×2): 1 via ORAL
  Filled 2014-12-22 (×3): qty 1

## 2014-12-22 MED ORDER — ENSURE PUDDING PO PUDG
1.0000 | Freq: Three times a day (TID) | ORAL | Status: DC
  Filled 2014-12-22 (×8): qty 1

## 2014-12-22 MED ORDER — GLUCERNA 1.2 CAL PO LIQD: 1000.0000 mL | ORAL | Status: DC

## 2014-12-22 NOTE — Progress Notes (Signed)
Natalie Wolfe [id number] DOB: July 21, 1930 DOA: 12/21/2014 PCP: [name], [name], MD  Brief narrative: 81 y/o ? ty II dm x 50 yrs, HTn, HIT, ESRD @ [facility]-actually has had a prior h/o NSTEMI 09/09/14 with VT-didn't need Life vest-at that admission was started on Coreg/Hydralazine and Imdur.  [facility]. She passed out well on the machine. This sounds strangely familiar and similar to her episode in February where she was felt to have a V. tach arrest. She does not recall anything that happened at the time other than "I hurt people laughing around it". She was sent by EMS to the hospital and was admitted. EKG performed was negative it was felt that patient probably had orthostatic vasovagal syncope She was seen by nephrology and discussion ensued regarding whether we should discontinue some of her cardiac medications or adjust her end-diastolic weight.  Past medical history-As per Problem list Chart reviewed as below-   Consultants:  Nephrology  Procedures:  None  Antibiotics:  None   Subjective   Circuitious No chest pain at present no nausea no vomiting Tolerating diet No blurred or double vision Has been unsteady on her feet for a while Lives at home alone-multiple family members however look in on her.   Objective    Interim History:   Telemetry:    Objective: Filed Vitals:   12/21/14 2030 12/21/14 2131 12/22/14 0614 12/22/14 0844  BP: 156/70 143/55  151/63  Pulse: 76 79 79 81  Temp:  97.4 F (36.3 C) 98 F (36.7 C) 97.8 F (36.6 C)  TempSrc:    Oral  Resp: 13 16 16 7   Weight:  47.628 kg (105 lb)    SpO2: 98% 99% 98% 98%    Intake/Output Summary (Last 24 hours) at 12/22/14 1452 Last data filed at 12/22/14 1248  Gross per 24 hour  Intake    720 ml  Output      0 ml  Net    720 ml    Exam:  General: EOMI NCAT frail, temporalis wasting bilaterally Cardiovascular: S1-S2 no murmur rub or gallop Respiratory:  Clinically clear no added sound Abdomen: Soft nontender nondistended no rebound no guarding Skin fistula appears patent with good thrill Neuro intact  Data Reviewed: Basic Metabolic Panel:  Recent Labs Lab 12/21/14 1724 12/22/14 0557  NA 131* 133*  K 4.5 5.3*  CL 92* 93*  CO2 28 26  GLUCOSE 121* 88  BUN 39* 48*  CREATININE 3.08* 4.03*  CALCIUM 8.6* 9.0   Liver Function Tests:  Recent Labs Lab 12/22/14 0557  AST 34  ALT 41  ALKPHOS 135*  BILITOT 0.8  PROT 6.4*  ALBUMIN 3.0*   No results for input(s): LIPASE, AMYLASE in the last 168 hours. No results for input(s): AMMONIA in the last 168 hours. CBC:  Recent Labs Lab 12/21/14 1724 12/22/14 0557  WBC 7.9 6.2  NEUTROABS 5.4  --   HGB 10.5* 10.2*  HCT 31.6* 31.1*  MCV 95.5 96.9  PLT 110* 116*   Cardiac Enzymes:  Recent Labs Lab 12/21/14 1724 12/22/14 0609  TROPONINI 0.07* 0.09*   BNP: Invalid input(s): POCBNP CBG:  Recent Labs Lab 12/21/14 2127  GLUCAP 119*    Recent Results (from the past 240 hour(s))  MRSA PCR Screening     Status: None   Collection Time: 12/22/14  5:58 AM  Result Value Ref Range Status   MRSA by PCR NEGATIVE NEGATIVE Final    Comment:  The GeneXpert MRSA Assay (FDA approved for NASAL specimens only), is one component of a comprehensive MRSA colonization surveillance program. It is not intended to diagnose MRSA infection nor to guide or monitor treatment for MRSA infections.      Studies:              All Imaging reviewed and is as per above notation   Scheduled Meds: . aspirin EC  81 mg Oral Daily  . carvedilol  6.25 mg Oral BID WC  . feeding supplement (ENSURE)  1 Container Oral TID BM  . feeding supplement (NEPRO CARB STEADY)  237 mL Oral BID BM  . hydrALAZINE  25 mg Oral 3 times per day  . insulin aspart  0-5 Units Subcutaneous QHS  . insulin aspart  0-9 Units Subcutaneous TID WC  . isosorbide mononitrate  30 mg Oral Daily  . mirtazapine  15 mg Oral  QHS  . multivitamin  1 tablet Oral QHS  . sodium chloride  3 mL Intravenous Q12H   Continuous Infusions:    Assessment/Plan: 1. Syncope? Probable orthostasis-keep on monitors. EKG repeat, cycle troponins also unclear utility in end-stage renal disease. Appreciate nephrology input regarding medications and adjustment of dialysis volumes 2. STEMI 09/2014-cath at that time was negative. She reported some chest pressure to nephrologist however I did not get the history from her. Reasonable to cycle troponins. Reassess in a.m. Keep on telemetry 3. 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 severe malnutrition-continue supplements-reweigh-see trends Vitals - 1 value per visit 12/21/2014 10/23/2014 09/18/2014 09/17/2014 09/15/2014  Weight (lb) 105 127.4  125   Height  5\' 5"    5\' 5"   BMI 17.47 21.2   20.8  5. DM ty 2-continue SSI-stable CBG's 6. chronic anemia & thrombocytopenia-obtain peripheral smear to search for clues. Her LFTs are normal so this is probably not liver related and do not know if she needs LDH heptoglobin and other workup at this stage. Monitor 7. HTN-see above discussion--adjust meds to probably nondialysis days and monitor   Code Status: full Family Communication: none Disposition Plan: inpatient pending resolution   Pleas KochJai Randall Colden, MD  [facility] Pager [phone] 12/22/2014, 2:52 PM

## 2014-12-22 NOTE — Evaluation (Signed)
Physical Therapy Evaluation Patient Details Name: Natalie Wolfe MRN: [id number] DOB: [birth year] Today's Date: 12/22/2014   History of Present Illness  81 y/o ? ty II dm x 50 yrs, HTn, HIT, ESRD @ [facility]-actually has had a prior h/o NSTEMI 09/09/14 with VT-didn't need Life vest-at that admission was started on Coreg/Hydralazine and Imdur. [facility] with [facility]  Clinical Impression   Pt admitted with above diagnosis. Pt currently with functional limitations due to the deficits listed below (see PT Problem List).  Pt will benefit from skilled PT to increase their independence and safety with mobility to allow discharge to the venue listed below.       Follow Up Recommendations Home health PT [facility](HHRN for chronic disease manageemnt)    Equipment Recommendations  3in1 (PT)    Recommendations for Other Services OT consult (for ADLs as pt lives alone)     Precautions / Restrictions Precautions Precautions: Fall      Mobility  Bed Mobility Overal bed mobility: Modified Independent             General bed mobility comments: incr time  Transfers Overall transfer level: Needs assistance Equipment used: Rolling walker (2 wheeled) Transfers: Sit to/from Stand Sit to Stand: Supervision         General transfer comment: Cues for safetly and hand placement  Ambulation/Gait Ambulation/Gait assistance: Min guard Ambulation Distance (Feet): 120 Feet Assistive device: Rolling walker (2 wheeled) Gait Pattern/deviations: Step-through pattern;Decreased stride length   Gait velocity interpretation: Below normal speed for age/gender General Gait Details: Notable dependence on UE support on RW for balance with amb  Stairs            Wheelchair Mobility    Modified Rankin (Stroke Patients Only)       Balance Overall balance assessment: Needs assistance           Standing balance-Leahy Scale: Poor Standing balance comment: Dependent on UE  support                             Pertinent Vitals/Pain Pain Assessment: No/denies pain    Home Living Family/patient expects to be discharged to:: Private residence Living Arrangements: Alone Available Help at Discharge: Family;Available PRN/intermittently Type of Home: Independent living facility Home Access: Level entry     Home Layout: One level Home Equipment: None;Grab bars - toilet;Grab bars - tub/shower;Hand held shower head;Shower seat      Prior Function Level of Independence: Independent         Comments: but with recent falls     Hand Dominance   Dominant Hand: Right    Extremity/Trunk Assessment   Upper Extremity Assessment: Overall WFL for tasks assessed           Lower Extremity Assessment: Generalized weakness         Communication   Communication: No difficulties  Cognition Arousal/Alertness: Awake/alert Behavior During Therapy: WFL for tasks assessed/performed Overall Cognitive Status: Within Functional Limits for tasks assessed                      General Comments      Exercises        Assessment/Plan    PT Assessment Patient needs continued PT services  PT Diagnosis Difficulty walking   PT Problem List Decreased strength;Decreased activity tolerance;Decreased balance;Decreased mobility;Decreased knowledge of use of DME  PT Treatment Interventions DME instruction;Gait training;Functional mobility training;Therapeutic activities;Therapeutic  exercise;Patient/family education   PT Goals (Current goals can be found in the Care Plan section) Acute Rehab PT Goals Patient Stated Goal: hopes to get home PT Goal Formulation: With patient Time For Goal Achievement: 01/05/15 Potential to Achieve Goals: Good    Frequency Min 3X/week   Barriers to discharge        Co-evaluation               End of Session   Activity Tolerance: Patient tolerated treatment well Patient left: in bed;with call  bell/phone within reach Nurse Communication: Mobility status    Functional Assessment Tool Used: Clinical Judgement Functional Limitation: Mobility: Walking and moving around Mobility: Walking and Moving Around Current Status ([id number](G8978): At least 1 percent but less than 20 percent impaired, limited or restricted Mobility: Walking and Moving Around Goal Status [phone](G8979): 0 percent impaired, limited or restricted    Time: 1339-1358 PT Time Calculation (min) (ACUTE ONLY): 19 min   Charges:         PT G Codes:   PT G-Codes **NOT FOR INPATIENT CLASS** Functional Assessment Tool Used: Clinical Judgement Functional Limitation: Mobility: Walking and moving around Mobility: Walking and Moving Around Current Status (U9811(G8978): At least 1 percent but less than 20 percent impaired, limited or restricted Mobility: Walking and Moving Around Goal Status [phone](G8979): 0 percent impaired, limited or restricted    Van ClinesGarrigan, Koji Niehoff [facility] 12/22/2014, 4:25 PM  Van ClinesHolly Cyncere Ruhe, PT  Acute Rehabilitation Services Pager [phone] Office [phone]

## 2014-12-22 NOTE — Consult Note (Signed)
[name] Renal Consultation Note  Indication for Consultation:  Management of ESRD/hemodialysis; anemia, hypertension/volume and secondary hyperparathyroidism  HPI: Natalie Wolfe is a 81 y.o. AA female with a history of Diabetes Type 2, hypertension, heparin-induced thrombocytopenia, and ESRD on dialysis at the [facility] who during the last 30 minutes of yesterday's treatment had a brief syncopal episode and, according to the technician, stopped breathing for about a minute.  Her systolic blood pressure dropped into the 80s, but EMS was called.  She presented to the hospital lethargic, but denied any nausea, vomiting, chest pain, diaphoresis, or palpitations.  She started dialysis on 1/27 at [facility], but during only her second outpatient treatment she became unresponsive and pulseless after five minutes, requiring CPR with spontaneous return of pulse. During her admission 2/4-9 her cardiac catheterization showed mild global LV dysfunction with EF of 40-45% and no significant coronary obstructive disease.  Currently she reports occasional dyspnea on exertion and notes that she sleeps on two pillows.  She has also had little appetite since starting dialysis over three months ago. She tells me her appetite has been poor until recently- BPs seem overall high- she has lost weight and has sxms of volume overload  Dialysis Orders:   TTS @ [place] 47.5 kg     3:30     2K/2.25Ca     400/800      No Heparin    AVF @ LUA (using 17G), R IJ catheter Hectorol 2 mcg      Mircera 150 mcg q2wk (given 5/10)        No Venofer  Past Medical History  Diagnosis Date  . Urinary frequency   . Routine general medical examination at a health care facility   . Other atopic dermatitis and related conditions   . Rash and other nonspecific skin eruption   . Other anxiety states   . Insomnia, unspecified   . Chronic kidney disease, stage II (mild)   . Type II or unspecified type  diabetes mellitus with renal manifestations, not stated as uncontrolled   . PVD (peripheral vascular disease)   . Unspecified vitamin D deficiency   . Anemia, unspecified   . Obesity, unspecified   . Nocturia   . Pain in joint, lower leg   . Other and unspecified hyperlipidemia   . Unspecified essential hypertension   . Cataract    Past Surgical History  Procedure Laterality Date  . Abdominal hysterectomy  1970  . Breast surgery  1979    nodule removed  . Eye surgery  2004    catarcts removed from right eye.  . Multiple tooth extractions  09/2013    Remonve remaining 3 teeth, no with dentures   . Av fistula placement Left 09/06/2014    Procedure: CREATION OF LEFT UPPER ARM ARTERIOVENOUS (AV) FISTULA ;  Surgeon: Mal Misty, MD;  Location: [place];  Service: Vascular;  Laterality: Left;  . Insertion of dialysis catheter N/A 09/06/2014    Procedure: INSERTION OF DIALYSIS CATHETER RIGHT INTERNAL JUGULAR VEIN;  Surgeon: Mal Misty, MD;  Location: [place];  Service: Vascular;  Laterality: N/A;  . Ligation of competing branches of arteriovenous fistula Left 09/06/2014    Procedure: LIGATION OF COMPETING BRANCHES OF ARTERIOVENOUS FISTULA;  Surgeon: Mal Misty, MD;  Location: [place];  Service: Vascular;  Laterality: Left;  . Left heart catheterization with coronary angiogram N/A 09/17/2014    Procedure: LEFT HEART CATHETERIZATION WITH CORONARY ANGIOGRAM;  Surgeon:  Troy Sine, MD;  Location: [facility] CATH LAB;  Service: Cardiovascular;  Laterality: N/A;   Family History  Problem Relation Age of Onset  . Diabetes Mother    Social History She previously smoked cigarettes, usually half a pack a day, until three years ago.  She also drank alcohol, mostly on weekends, but has now quit and denies any history of illicit drug use.  Her husband is deceased, and she lives alone in an apartment building for senior citizens.  Allergies  Allergen Reactions  . Heparin Other (See Comments)    Positive  Hep Induced Plt Ab and SRA 09/12/2014   Prior to Admission medications   Medication Sig Start Date End Date Taking? Authorizing Provider  acetaminophen (TYLENOL) 325 MG tablet Take 650 mg by mouth daily as needed for mild pain.    Yes Historical Provider, MD  amLODipine (NORVASC) 10 MG tablet Take one tablet by mouth once daily for blood pressure 10/23/14  Yes Estill Dooms, MD  aspirin EC 81 MG EC tablet Take 1 tablet (81 mg total) by mouth daily. 09/17/14  Yes Reyne Dumas, MD  carvedilol (COREG) 6.25 MG tablet Take 6.25 mg by mouth 2 (two) times daily with a meal.   Yes Historical Provider, MD  fluocinonide cream (LIDEX) 2.99 % Apply 1 application topically 2 (two) times daily. Apply from neck down twice a day (provided by dermatology).   Yes Historical Provider, MD  glimepiride (AMARYL) 2 MG tablet Take 1 mg by mouth daily. Take 1/2 tablet by mouth once daily to control blood sugar   Yes Historical Provider, MD  hydrALAZINE (APRESOLINE) 25 MG tablet Take 1 tablet (25 mg total) by mouth every 8 (eight) hours. 11/07/14  Yes Estill Dooms, MD  isosorbide mononitrate (IMDUR) 30 MG 24 hr tablet Take 30 mg by mouth daily.   Yes Historical Provider, MD  mirtazapine (REMERON) 15 MG tablet Take One tablet by mouth nightly to help appetite Patient taking differently: Take 15 mg by mouth at bedtime. Take One tablet by mouth nightly to help appetite 11/07/14  Yes Estill Dooms, MD  multivitamin (RENA-VIT) TABS tablet Take 1 tablet by mouth at bedtime. 09/09/14  Yes Belkys A Regalado, MD  calcium acetate (PHOSLO) 667 MG capsule Take 1 capsule (667 mg total) by mouth 3 (three) times daily with meals. Patient not taking: Reported on 12/21/2014 09/09/14   Elmarie Shiley, MD  glucose blood (BAYER CONTOUR NEXT TEST) test strip Use as instructed 11/27/12   Tivis Ringer, RPH-CPP   Labs:  Results for orders placed or performed during the hospital encounter of 12/21/14 (from the past 48 hour(s))  CBC with  Differential/Platelet     Status: Abnormal   Collection Time: 12/21/14  5:24 PM  Result Value Ref Range   WBC 7.9 4.0 - 10.5 K/uL   RBC 3.31 (L) 3.87 - 5.11 MIL/uL   Hemoglobin 10.5 (L) 12.0 - 15.0 g/dL   HCT 31.6 (L) 36.0 - 46.0 %   MCV 95.5 78.0 - 100.0 fL   MCH 31.7 26.0 - 34.0 pg   MCHC 33.2 30.0 - 36.0 g/dL   RDW 18.0 (H) 11.5 - 15.5 %   Platelets 110 (L) 150 - 400 K/uL    Comment: SPECIMEN CHECKED FOR CLOTS REPEATED TO VERIFY PLATELET COUNT CONFIRMED BY SMEAR    Neutrophils Relative % 68 43 - 77 %   Neutro Abs 5.4 1.7 - 7.7 K/uL   Lymphocytes Relative 8 (L) 12 -  46 %   Lymphs Abs 0.6 (L) 0.7 - 4.0 K/uL   Monocytes Relative 14 (H) 3 - 12 %   Monocytes Absolute 1.1 (H) 0.1 - 1.0 K/uL   Eosinophils Relative 9 (H) 0 - 5 %   Eosinophils Absolute 0.7 0.0 - 0.7 K/uL   Basophils Relative 1 0 - 1 %   Basophils Absolute 0.0 0.0 - 0.1 K/uL  Basic metabolic panel     Status: Abnormal   Collection Time: 12/21/14  5:24 PM  Result Value Ref Range   Sodium 131 (L) 135 - 145 mmol/L   Potassium 4.5 3.5 - 5.1 mmol/L   Chloride 92 (L) 101 - 111 mmol/L   CO2 28 22 - 32 mmol/L   Glucose, Bld 121 (H) 65 - 99 mg/dL   BUN 39 (H) 6 - 20 mg/dL   Creatinine, Ser 3.08 (H) 0.44 - 1.00 mg/dL   Calcium 8.6 (L) 8.9 - 10.3 mg/dL   GFR calc non Af Amer 13 (L) >60 mL/min   GFR calc Af Amer 15 (L) >60 mL/min    Comment: (NOTE) The eGFR has been calculated using the CKD EPI equation. This calculation has not been validated in all clinical situations. eGFR's persistently <60 mL/min signify possible Chronic Kidney Disease.    Anion gap 11 5 - 15  Troponin I     Status: Abnormal   Collection Time: 12/21/14  5:24 PM  Result Value Ref Range   Troponin I 0.07 (H) <0.031 ng/mL    Comment:        PERSISTENTLY INCREASED TROPONIN VALUES IN THE RANGE OF 0.04-0.49 ng/mL CAN BE SEEN IN:       -UNSTABLE ANGINA       -CONGESTIVE HEART FAILURE       -MYOCARDITIS       -CHEST TRAUMA       -ARRYHTHMIAS        -LATE PRESENTING MYOCARDIAL INFARCTION       -COPD   CLINICAL FOLLOW-UP RECOMMENDED.   Glucose, capillary     Status: Abnormal   Collection Time: 12/21/14  9:27 PM  Result Value Ref Range   Glucose-Capillary 119 (H) 65 - 99 mg/dL  Comprehensive metabolic panel     Status: Abnormal   Collection Time: 12/22/14  5:57 AM  Result Value Ref Range   Sodium 133 (L) 135 - 145 mmol/L   Potassium 5.3 (H) 3.5 - 5.1 mmol/L   Chloride 93 (L) 101 - 111 mmol/L   CO2 26 22 - 32 mmol/L   Glucose, Bld 88 65 - 99 mg/dL   BUN 48 (H) 6 - 20 mg/dL   Creatinine, Ser 4.03 (H) 0.44 - 1.00 mg/dL   Calcium 9.0 8.9 - 10.3 mg/dL   Total Protein 6.4 (L) 6.5 - 8.1 g/dL   Albumin 3.0 (L) 3.5 - 5.0 g/dL   AST 34 15 - 41 U/L   ALT 41 14 - 54 U/L   Alkaline Phosphatase 135 (H) 38 - 126 U/L   Total Bilirubin 0.8 0.3 - 1.2 mg/dL   GFR calc non Af Amer 9 (L) >60 mL/min   GFR calc Af Amer 11 (L) >60 mL/min    Comment: (NOTE) The eGFR has been calculated using the CKD EPI equation. This calculation has not been validated in all clinical situations. eGFR's persistently <60 mL/min signify possible Chronic Kidney Disease.    Anion gap 14 5 - 15  CBC     Status: Abnormal   Collection  Time: 12/22/14  5:57 AM  Result Value Ref Range   WBC 6.2 4.0 - 10.5 K/uL   RBC 3.21 (L) 3.87 - 5.11 MIL/uL   Hemoglobin 10.2 (L) 12.0 - 15.0 g/dL   HCT 31.1 (L) 36.0 - 46.0 %   MCV 96.9 78.0 - 100.0 fL   MCH 31.8 26.0 - 34.0 pg   MCHC 32.8 30.0 - 36.0 g/dL   RDW 18.2 (H) 11.5 - 15.5 %   Platelets 116 (L) 150 - 400 K/uL    Comment: CONSISTENT WITH PREVIOUS RESULT  MRSA PCR Screening     Status: None   Collection Time: 12/22/14  5:58 AM  Result Value Ref Range   MRSA by PCR NEGATIVE NEGATIVE    Comment:        The GeneXpert MRSA Assay (FDA approved for NASAL specimens only), is one component of a comprehensive MRSA colonization surveillance program. It is not intended to diagnose MRSA infection nor to guide or monitor  treatment for MRSA infections.   Troponin I     Status: Abnormal   Collection Time: 12/22/14  6:09 AM  Result Value Ref Range   Troponin I 0.09 (H) <0.031 ng/mL    Comment:        PERSISTENTLY INCREASED TROPONIN VALUES IN THE RANGE OF 0.04-0.49 ng/mL CAN BE SEEN IN:       -UNSTABLE ANGINA       -CONGESTIVE HEART FAILURE       -MYOCARDITIS       -CHEST TRAUMA       -ARRYHTHMIAS       -LATE PRESENTING MYOCARDIAL INFARCTION       -COPD   CLINICAL FOLLOW-UP RECOMMENDED.    Constitutional: positive for weight loss, negative for chills, fatigue, fevers and sweats Ears, nose, mouth, throat, and face: negative for earaches, hoarseness, nasal congestion and sore throat Respiratory: positive for dyspnea on exertion; negative for cough, hemoptysis and sputum Cardiovascular: positive for orthopnea and paroxysmal nocturnal dyspnea, negative for chest pain, dyspnea and palpitations Gastrointestinal: positive for poor appetite, negative for abdominal pain, change in bowel habits, nausea and vomiting Genitourinary:negative, oliguric Musculoskeletal:negative for arthralgias, back pain, myalgias and neck pain Neurological: negative for dizziness, gait problems, headaches, paresthesia and speech problems  Physical Exam: Filed Vitals:   12/22/14 0844  BP: 151/63  Pulse: 81  Temp: 97.8 F (36.6 C)  Resp: 7     General appearance: alert, cooperative and no distress Head: Normocephalic, without obvious abnormality, atraumatic Neck: + JVD, but no adenopathy, no carotid bruit and supple, symmetrical, trachea midline Resp: clear to auscultation bilaterally Cardio: regular rate and rhythm, S1, S2 normal, no murmur, click, rub or gallop GI: soft, non-tender; bowel sounds normal; no masses,  no organomegaly Extremities: extremities normal, atraumatic, no cyanosis or edema Neurologic: Grossly normal Dialysis Access: AVF @ LUA with + bruit, R IJ catheter   Assessment/Plan: 1. Syncope - near end of  HD, possibly sec to low BP; Hx cardiac arrest during outpatient HD 2/4, cardiac cath showed EF 40-45%, no significant CAD. Is not volume depleted by orthostatics- in fact seems overloaded.  Maybe 3:30 is not enough time ?? Seems has lost weight and maybe EDW needs decrease- is small and wont tolerate much volume removal at a time- profile ? 2. ESRD - HD on TTS @ [place]-  K 5.3.   3. Hypertension/volume - BP 151/63, dropped into 80s during syncopal episode; on Amlodipine 5 mg qd (reduced from 10), Carvedilol 6.25 mg bid,  Hydralazine 25 mg q8h, takes no meds pre-HD; wt 47.6 kg, but DOE & orthopnea.  Check CXR. Will stop norvasc- pt agreeable to extra treatment tomorrow to remove volume 4. Anemia - Hgb 10.2 on outpatient Mircera (given 5/10). 5. Sec HPT - Ca 9 (9.8 corrected); Hectorol 2 mcg, no binder. 6. Nutrition - Alb 3, renal carb-mod diet, vitamin. 7. DM Type 2 - per primary. 8. 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  LYLES,CHARLES 12/22/2014, 10:24 AM   Attending Nephrologist:  Corliss Parish, MD  Patient seen and examined, agree with above note with above modifications. 81 year old BF on HD now for 3 months- describes appetite is still and issue and seems to have sxms of volume overload.  Had decreased BP with HD yest- may need increased time or profile to achieve UF as is losing wieght.  I have stopped norvasc and patient agrees to extra treatment Monday in addition to normal treatment Tuesday to be able to decrease EDW  Corliss Parish, MD 12/22/2014

## 2014-12-23 DIAGNOSIS — I120 Hypertensive chronic kidney disease with stage 5 chronic kidney disease or end stage renal disease: Secondary | ICD-10-CM | POA: Diagnosis present

## 2014-12-23 DIAGNOSIS — Z992 Dependence on renal dialysis: Secondary | ICD-10-CM | POA: Diagnosis not present

## 2014-12-23 DIAGNOSIS — R55 Syncope and collapse: Secondary | ICD-10-CM | POA: Diagnosis present

## 2014-12-23 DIAGNOSIS — T50995A Adverse effect of other drugs, medicaments and biological substances, initial encounter: Secondary | ICD-10-CM | POA: Diagnosis present

## 2014-12-23 DIAGNOSIS — E1122 Type 2 diabetes mellitus with diabetic chronic kidney disease: Secondary | ICD-10-CM | POA: Diagnosis present

## 2014-12-23 DIAGNOSIS — Z888 Allergy status to other drugs, medicaments and biological substances status: Secondary | ICD-10-CM | POA: Diagnosis not present

## 2014-12-23 DIAGNOSIS — N186 End stage renal disease: Secondary | ICD-10-CM | POA: Diagnosis present

## 2014-12-23 DIAGNOSIS — I252 Old myocardial infarction: Secondary | ICD-10-CM | POA: Diagnosis not present

## 2014-12-23 DIAGNOSIS — E1129 Type 2 diabetes mellitus with other diabetic kidney complication: Secondary | ICD-10-CM | POA: Diagnosis not present

## 2014-12-23 DIAGNOSIS — J811 Chronic pulmonary edema: Secondary | ICD-10-CM | POA: Diagnosis present

## 2014-12-23 DIAGNOSIS — Z87891 Personal history of nicotine dependence: Secondary | ICD-10-CM | POA: Diagnosis not present

## 2014-12-23 DIAGNOSIS — Z90710 Acquired absence of both cervix and uterus: Secondary | ICD-10-CM | POA: Diagnosis not present

## 2014-12-23 DIAGNOSIS — I739 Peripheral vascular disease, unspecified: Secondary | ICD-10-CM | POA: Diagnosis present

## 2014-12-23 DIAGNOSIS — D696 Thrombocytopenia, unspecified: Secondary | ICD-10-CM | POA: Diagnosis present

## 2014-12-23 DIAGNOSIS — E43 Unspecified severe protein-calorie malnutrition: Secondary | ICD-10-CM | POA: Insufficient documentation

## 2014-12-23 DIAGNOSIS — Z681 Body mass index (BMI) 19 or less, adult: Secondary | ICD-10-CM | POA: Diagnosis not present

## 2014-12-23 DIAGNOSIS — E1165 Type 2 diabetes mellitus with hyperglycemia: Secondary | ICD-10-CM | POA: Diagnosis present

## 2014-12-23 DIAGNOSIS — I509 Heart failure, unspecified: Secondary | ICD-10-CM | POA: Diagnosis present

## 2014-12-23 DIAGNOSIS — Z7982 Long term (current) use of aspirin: Secondary | ICD-10-CM | POA: Diagnosis not present

## 2014-12-23 DIAGNOSIS — Z8674 Personal history of sudden cardiac arrest: Secondary | ICD-10-CM | POA: Diagnosis not present

## 2014-12-23 DIAGNOSIS — D638 Anemia in other chronic diseases classified elsewhere: Secondary | ICD-10-CM | POA: Diagnosis not present

## 2014-12-23 DIAGNOSIS — D631 Anemia in chronic kidney disease: Secondary | ICD-10-CM | POA: Diagnosis present

## 2014-12-23 DIAGNOSIS — N2581 Secondary hyperparathyroidism of renal origin: Secondary | ICD-10-CM | POA: Diagnosis present

## 2014-12-23 DIAGNOSIS — E785 Hyperlipidemia, unspecified: Secondary | ICD-10-CM | POA: Diagnosis present

## 2014-12-23 DIAGNOSIS — I420 Dilated cardiomyopathy: Secondary | ICD-10-CM | POA: Diagnosis present

## 2014-12-23 DIAGNOSIS — F419 Anxiety disorder, unspecified: Secondary | ICD-10-CM | POA: Diagnosis present

## 2014-12-23 DIAGNOSIS — Z9841 Cataract extraction status, right eye: Secondary | ICD-10-CM | POA: Diagnosis not present

## 2014-12-23 LAB — CBC
HCT: 32.3 % — ABNORMAL LOW (ref 36.0–46.0)
HEMATOCRIT: 31.7 % — AB (ref 36.0–46.0)
HEMOGLOBIN: 10.3 g/dL — AB (ref 12.0–15.0)
Hemoglobin: 10.3 g/dL — ABNORMAL LOW (ref 12.0–15.0)
MCH: 30.9 pg (ref 26.0–34.0)
MCH: 30.7 pg (ref 26.0–34.0)
MCHC: 32.5 g/dL (ref 30.0–36.0)
MCHC: 31.9 g/dL (ref 30.0–36.0)
MCV: 95.2 fL (ref 78.0–100.0)
MCV: 96.1 fL (ref 78.0–100.0)
PLATELETS: 120 10*3/uL — AB (ref 150–400)
Platelets: 146 10*3/uL — ABNORMAL LOW (ref 150–400)
RBC: 3.33 MIL/uL — ABNORMAL LOW (ref 3.87–5.11)
RBC: 3.36 MIL/uL — AB (ref 3.87–5.11)
RDW: 18.3 % — ABNORMAL HIGH (ref 11.5–15.5)
RDW: 18.4 % — AB (ref 11.5–15.5)
WBC: 5.1 10*3/uL (ref 4.0–10.5)
WBC: 6.7 10*3/uL (ref 4.0–10.5)

## 2014-12-23 LAB — RENAL FUNCTION PANEL
ALBUMIN: 2.9 g/dL — AB (ref 3.5–5.0)
Anion gap: 16 — ABNORMAL HIGH (ref 5–15)
BUN: 65 mg/dL — ABNORMAL HIGH (ref 6–20)
CO2: 23 mmol/L (ref 22–32)
Calcium: 9.2 mg/dL (ref 8.9–10.3)
Chloride: 88 mmol/L — ABNORMAL LOW (ref 101–111)
Creatinine, Ser: 5.57 mg/dL — ABNORMAL HIGH (ref 0.44–1.00)
GFR calc Af Amer: 7 mL/min — ABNORMAL LOW (ref >60)
GFR calc non Af Amer: 6 mL/min — ABNORMAL LOW (ref >60)
GLUCOSE: 90 mg/dL (ref 65–99)
PHOSPHORUS: 5 mg/dL — AB (ref 2.5–4.6)
Potassium: 5.6 mmol/L — ABNORMAL HIGH (ref 3.5–5.1)
Sodium: 127 mmol/L — ABNORMAL LOW (ref 135–145)

## 2014-12-23 LAB — GLUCOSE, CAPILLARY
Glucose-Capillary: 98 mg/dL (ref 65–99)
Glucose-Capillary: 73 mg/dL (ref 65–99)
Glucose-Capillary: 102 mg/dL — ABNORMAL HIGH (ref 65–99)
Glucose-Capillary: 125 mg/dL — ABNORMAL HIGH (ref 65–99)

## 2014-12-23 MED ORDER — PENTAFLUOROPROP-TETRAFLUOROETH EX AERO: 1.0000 "application " | INHALATION_SPRAY | CUTANEOUS | Status: DC | PRN

## 2014-12-23 MED ORDER — LIDOCAINE-PRILOCAINE 2.5-2.5 % EX CREA: 1.0000 "application " | TOPICAL_CREAM | CUTANEOUS | Status: DC | PRN

## 2014-12-23 MED ORDER — ALTEPLASE 2 MG IJ SOLR
2.0000 mg | Freq: Once | INTRAMUSCULAR | Status: AC | PRN
  Filled 2014-12-23: qty 2

## 2014-12-23 MED ORDER — HEPARIN SODIUM (PORCINE) 1000 UNIT/ML DIALYSIS: 1000.0000 [IU] | INTRAMUSCULAR | Status: DC | PRN

## 2014-12-23 MED ORDER — NEPRO/CARBSTEADY PO LIQD: 237.0000 mL | ORAL | Status: DC | PRN

## 2014-12-23 MED ORDER — NEPRO/CARBSTEADY PO LIQD
237.0000 mL | Freq: Three times a day (TID) | ORAL | Status: DC
  Administered 2014-12-23 – 2014-12-24 (×2): 237 mL via ORAL

## 2014-12-23 MED ORDER — SODIUM CHLORIDE 0.9 % IV SOLN: 100.0000 mL | INTRAVENOUS | Status: DC | PRN

## 2014-12-23 MED ORDER — ALTEPLASE 2 MG IJ SOLR: 2.0000 mg | Freq: Once | INTRAMUSCULAR | Status: DC | PRN

## 2014-12-23 MED ORDER — SODIUM CHLORIDE 0.9 % IV SOLN
100.0000 mL | INTRAVENOUS | Status: DC | PRN
Start: 2014-12-23 — End: 2014-12-23

## 2014-12-23 MED ORDER — LIDOCAINE HCL (PF) 1 % IJ SOLN
5.0000 mL | INTRAMUSCULAR | Status: DC | PRN
Start: 2014-12-23 — End: 2014-12-24

## 2014-12-23 MED ORDER — LIDOCAINE HCL (PF) 1 % IJ SOLN: 5.0000 mL | INTRAMUSCULAR | Status: DC | PRN

## 2014-12-23 MED ORDER — ANTICOAGULANT SODIUM CITRATE 4% (200MG/5ML) IV SOLN
5.0000 mL | Status: DC | PRN
  Filled 2014-12-23 (×2): qty 250

## 2014-12-23 NOTE — Progress Notes (Signed)
  [facility] Progress Note   Subjective: no complaints     Filed Vitals:   12/22/14 2105 12/23/14 0436 12/23/14 0802 12/23/14 0840  BP: 141/68 157/60 155/74 128/75  Pulse: 64 64 68 66  Temp: 97.5 F (36.4 C) 97.5 F (36.4 C) 97.4 F (36.3 C) 97.6 F (36.4 C)  TempSrc: Oral Oral Oral Oral  Resp:   17 18  Weight:      SpO2: 100% 100% 94% 95%   Exam: Alert, no distress No jvd Bilat rales at bases RRR no MRG Abd soft ntnd no ascites No leg or UE edema R IJ cath, LUA AVF +bruit  HD: [place] 47.5 kg 3:30 2K/2.25Ca 400/800 No Heparin AVF @ LUA (using 17G), R IJ catheter Hectorol 2 mcg Mircera 150 mcg q2wk (given 5/10) No Venofer        Assessment: 1. Pulm edema - extra HD today and again tomorrow, get vol down 2. ESRD on HD 3. HTN/vol suspected vol excess from lean body wt loss 4. Syncope unclear cause 5. Anemia on esa 6. MBD cont vdra 7. DM2  Plan - HD today and on Tuesday    Vinson Moselleob Jahne Krukowski MD  pager [phone]    cell [phone]  12/23/2014, 9:44 AM     Recent Labs Lab 12/21/14 1724 12/22/14 0557 12/23/14 0500  NA 131* 133* 127*  K 4.5 5.3* 5.6*  CL 92* 93* 88*  CO2 28 26 23   GLUCOSE 121* 88 90  BUN 39* 48* 65*  CREATININE 3.08* 4.03* 5.57*  CALCIUM 8.6* 9.0 9.2  PHOS  --   --  5.0*    Recent Labs Lab 12/22/14 0557 12/23/14 0500  AST 34  --   ALT 41  --   ALKPHOS 135*  --   BILITOT 0.8  --   PROT 6.4*  --   ALBUMIN 3.0* 2.9*    Recent Labs Lab 12/21/14 1724 12/22/14 0557 12/23/14 0500  WBC 7.9 6.2 5.1  NEUTROABS 5.4  --   --   HGB 10.5* 10.2* 10.3*  HCT 31.6* 31.1* 31.7*  MCV 95.5 96.9 95.2  PLT 110* 116* 146*   . aspirin EC  81 mg Oral Daily  . carvedilol  6.25 mg Oral BID WC  . feeding supplement (ENSURE)  1 Container Oral TID BM  . feeding supplement (NEPRO CARB STEADY)  237 mL Oral BID BM  . hydrALAZINE  25 mg Oral 3 times per day  . insulin aspart  0-5 Units Subcutaneous QHS   . insulin aspart  0-9 Units Subcutaneous TID WC  . isosorbide mononitrate  30 mg Oral Daily  . mirtazapine  15 mg Oral QHS  . multivitamin  1 tablet Oral QHS  . sodium chloride  3 mL Intravenous Q12H     acetaminophen **OR** acetaminophen, ondansetron **OR** ondansetron (ZOFRAN) IV

## 2014-12-23 NOTE — Progress Notes (Signed)
Patient stated she did not want to get IV tonight.  Will attempt IV access in AM.

## 2014-12-23 NOTE — Care Management Note (Signed)
Case Management Note  Patient Details  Name: Natalie Wolfe MRN: [id number] Date of Birth: 06/20/1930  Subjective/Objective:                 CM following for progression and d/c planning   Action/Plan: This CM arranged HH for this pt previously , per pt cousin ( and nearest relative) Alfred LevinsGeorge Satterfield this pt did not allow HH to visit previously, due to her "hoarding' issues. Per [name] [facility] he will arrange to be at the home for their visit. [name], Corrie DandyMary , notified of this and will informed staff. [name]'s phone number is [phone]. [name] also states that the pt does not have room for a 3:1 and would not use in the home as she also does not use her walker due to space restraints.   Expected Discharge Date:        12/25/2014          Expected Discharge Plan:  Home w Home Health Services  In-House Referral:  NA  Discharge planning Services  CM Consult  Post Acute Care Choice:   Choice offered to:  Patient  DME Arranged:   DME Agency:    HH Arranged:  RN, PT, OT, Nurse's Aide HH Agency:  [facility]  Status of Service:  Completed, signed off  Medicare Important Message Given:  Yes Date Medicare IM Given:  12/23/14 Medicare IM give by:  [name], case manager Date Additional Medicare IM Given:    Additional Medicare Important Message give by:     If discussed at Long Length of Stay Meetings, dates discussed:    Additional Comments:  [name], [name], RN 12/23/2014, 2:26 PM

## 2014-12-23 NOTE — Progress Notes (Signed)
Initial Nutrition Assessment  DOCUMENTATION CODES:  Severe malnutrition in context of chronic illness, Underweight   Pt meets criteria for SEVERE MALNUTRITION in the context of chronic illness as evidenced by a 13% weight loss in 1 month and severe fat and muscle mass loss.  INTERVENTION:  Nepro shake (TID), each supplement provides 425 kcal and 19 grams protein  Encourage adequate PO intake.   NUTRITION DIAGNOSIS:  Malnutrition related to chronic illness as evidenced by percent weight loss, severe depletion of body fat, severe depletion of muscle mass.  GOAL:  Patient will meet greater than or equal to 90% of their needs  MONITOR:  PO intake, Weight trends, Labs, I & O's  REASON FOR ASSESSMENT:  Malnutrition Screening Tool    ASSESSMENT: Pt with Past medical history of ESRD on hemodialysis Tuesday Thursday Saturday, anemia of chronic kidney disease, hypertension, peripheral vascular disease, diabetes mellitus. The patient is presenting with complaints of passing out episode.  Pt reports having a decreased appetite which has been ongoing over the past 6 months. Pt has been trying to eat 3 meals at most daily. Pt additionally has been consuming Nepro Shake at home 3 times a day. Pt reports having weight loss with usual body weight of 150 lbs. Per Epic weight records, pt with a 13% weight loss in 1 month and a 30% weight loss in 1 year. RD to modify orders for Nepro Shake TID. Current meal completion has been 50-100%. Pt was encouraged to eat her food at meals and to drink her supplements.   Labs: Low sodium, chloride, GFR. High potassium (5.6), BUN, creatinine, and phosphorous (5.0).  Height:  Ht Readings from Last 1 Encounters:  10/23/14 5\' 5"  (1.651 m)    Weight:  Wt Readings from Last 1 Encounters:  12/23/14 106 lb 14.8 oz (48.5 kg)    Ideal Body Weight:  56.8 kg  Wt Readings from Last 10 Encounters:  12/23/14 106 lb 14.8 oz (48.5 kg)  10/23/14 127 lb 6.4 oz  (57.788 kg)  09/17/14 125 lb (56.7 kg)  09/08/14 132 lb 7.9 oz (60.1 kg)  04/03/14 142 lb (64.411 kg)  01/02/14 158 lb (71.668 kg)  12/18/13 161 lb (73.029 kg)  11/27/13 160 lb (72.576 kg)  07/24/13 166 lb 9.6 oz (75.569 kg)  05/01/13 170 lb 12.8 oz (77.474 kg)    BMI:  Body mass index is 17.79 kg/(m^2). Underweight  Adjusted body weight: 53 kg  Estimated Nutritional Needs:  Kcal:  1750-1900  Protein:  75-90 grams  Fluid:  1.2 L/day  Skin:  Reviewed, no issues  Diet Order:  Diet renal/carb modified with fluid restriction Diet-HS Snack?: Nothing; Room service appropriate?: Yes; Fluid consistency:: Thin  EDUCATION NEEDS:  No education needs identified at this time   Intake/Output Summary (Last 24 hours) at 12/23/14 1526 Last data filed at 12/23/14 1245  Gross per 24 hour  Intake    480 ml  Output   2000 ml  Net  -1520 ml    Last BM:  PTA  Marijean NiemannStephanie La, MS, RD, LDN Pager # [phone] After hours/ weekend pager # [phone]

## 2014-12-23 NOTE — Progress Notes (Signed)
Natalie ReaderGeorgianna Wolfe ZOX:[id number] DOB: [birth year] DOA: 12/21/2014 PCP: [name], [name], MD  Brief narrative: 81 y/o ? ty II dm x 50 yrs, HTn, HIT, ESRD @ [facility]-actually has had a prior h/o NSTEMI 09/09/14 with VT-didn't need Life vest-at that admission was started on Coreg/Hydralazine and Imdur.  She [facility] with syncopal episode. She passed out well on the machine. This sounds strangely familiar and similar to her episode in February where she was felt to have a V. tach arrest. She does not recall anything that happened at the time other than "I hurt people laughing around it". She was sent by EMS to the hospital and was admitted. EKG performed was negative it was felt that patient probably had orthostatic vasovagal syncope She was seen by nephrology and discussion ensued regarding whether we should discontinue some of her cardiac medications or adjust her end-diastolic weight.  Past medical history-As per Problem list Chart reviewed as below-   Consultants:  Nephrology  Procedures:  None  Antibiotics:  None   Subjective   doin fair  Tolerated dialysis well today No n/v/cp No sob Eating and drinking well No fever   Objective    Interim History:   Telemetry:    Objective: Filed Vitals:   12/23/14 1120 12/23/14 1210 12/23/14 1245 12/23/14 1659  BP: 168/77 165/72 175/81 152/66  Pulse: 74 73 81 77  Temp:    97.4 F (36.3 C)  TempSrc:    Oral  Resp:    17  Weight:   48.5 kg (106 lb 14.8 oz)   SpO2:   95% 95%    Intake/Output Summary (Last 24 hours) at 12/23/14 2029 Last data filed at 12/23/14 1747  Gross per 24 hour  Intake    480 ml  Output   2000 ml  Net  -1520 ml    Exam:  General: EOMI NCAT frail, temporalis wasting bilaterally Cardiovascular: S1-S2 no murmur rub or gallop Respiratory: Clinically clear no added sound Abdomen: Soft nontender nondistended no rebound no guarding Skin fistula appears patent with good thrill   Data  Reviewed: Basic Metabolic Panel:  Recent Labs Lab 12/21/14 1724 12/22/14 0557 12/23/14 0500  NA 131* 133* 127*  K 4.5 5.3* 5.6*  CL 92* 93* 88*  CO2 28 26 23   GLUCOSE 121* 88 90  BUN 39* 48* 65*  CREATININE 3.08* 4.03* 5.57*  CALCIUM 8.6* 9.0 9.2  PHOS  --   --  5.0*   Liver Function Tests:  Recent Labs Lab 12/22/14 0557 12/23/14 0500  AST 34  --   ALT 41  --   ALKPHOS 135*  --   BILITOT 0.8  --   PROT 6.4*  --   ALBUMIN 3.0* 2.9*   No results for input(s): LIPASE, AMYLASE in the last 168 hours. No results for input(s): AMMONIA in the last 168 hours. CBC:  Recent Labs Lab 12/21/14 1724 12/22/14 0557 12/23/14 0500 12/23/14 1910  WBC 7.9 6.2 5.1 6.7  NEUTROABS 5.4  --   --   --   HGB 10.5* 10.2* 10.3* 10.3*  HCT 31.6* 31.1* 31.7* 32.3*  MCV 95.5 96.9 95.2 96.1  PLT 110* 116* 146* 120*   Cardiac Enzymes:  Recent Labs Lab 12/21/14 1724 12/22/14 0609  TROPONINI 0.07* 0.09*   BNP: Invalid input(s): POCBNP CBG:  Recent Labs Lab 12/22/14 1720 12/22/14 2107 12/23/14 0723 12/23/14 1350 12/23/14 1630  GLUCAP 97 97 98 73 102*    Recent Results (from the past  240 hour(s))  MRSA PCR Screening     Status: None   Collection Time: 12/22/14  5:58 AM  Result Value Ref Range Status   MRSA by PCR NEGATIVE NEGATIVE Final    Comment:        The GeneXpert MRSA Assay (FDA approved for NASAL specimens only), is one component of a comprehensive MRSA colonization surveillance program. It is not intended to diagnose MRSA infection nor to guide or monitor treatment for MRSA infections.      Studies:              All Imaging reviewed and is as per above notation   Scheduled Meds: . aspirin EC  81 mg Oral Daily  . carvedilol  6.25 mg Oral BID WC  . feeding supplement (NEPRO CARB STEADY)  237 mL Oral TID BM  . hydrALAZINE  25 mg Oral 3 times per day  . insulin aspart  0-5 Units Subcutaneous QHS  . insulin aspart  0-9 Units Subcutaneous TID WC  .  isosorbide mononitrate  30 mg Oral Daily  . mirtazapine  15 mg Oral QHS  . multivitamin  1 tablet Oral QHS  . sodium chloride  3 mL Intravenous Q12H   Continuous Infusions:    Assessment/Plan: 1. Syncope-.probably combination of anti-htn meds and dialysis Appreciate nephrology input regarding medications and adjustment of dialysis volumes--meds have been adjusted.  Might need to dose meds on non-dialysis day?  Norvasc d/c'd STEMI 09/2014-cath at that time was negative. She reported some chest pressure to nephrologist however I did not get the history from her. troponins ? from Renal disease and clinically reassuring as no over CP-she has SOB on exertion-might consider 6 min walk test 2. 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 severe malnutrition-continue supplements-reweigh-see trends Vitals - 1 value per visit 12/21/2014 10/23/2014 09/18/2014 09/17/2014 09/15/2014  Weight (lb) 105 127.4  125   Height  5\' 5"    5\' 5"   BMI 17.47 21.2   20.8   4. chronic anemia & thrombocytopenia-obtain peripheral smear to search for clues.HIT PANEL last admit was neg.  Her LFTs are normal so this is probably not liver related . Monitor 5. HTN-see above discussion  Code Status: full Family Communication: none Disposition Plan: inpatient pending resolution   Pleas KochJai Avriel Kandel, MD  [facility] Pager [phone] 12/23/2014, 8:29 PM    LOS: 0 days

## 2014-12-24 ENCOUNTER — Inpatient Hospital Stay (HOSPITAL_COMMUNITY): Payer: Medicare Other

## 2014-12-24 LAB — CBC
HEMATOCRIT: 32.8 % — AB (ref 36.0–46.0)
Hemoglobin: 10.4 g/dL — ABNORMAL LOW (ref 12.0–15.0)
MCH: 30.7 pg (ref 26.0–34.0)
MCHC: 31.7 g/dL (ref 30.0–36.0)
MCV: 96.8 fL (ref 78.0–100.0)
Platelets: 130 10*3/uL — ABNORMAL LOW (ref 150–400)
RBC: 3.39 MIL/uL — AB (ref 3.87–5.11)
RDW: 18.4 % — ABNORMAL HIGH (ref 11.5–15.5)
WBC: 6.1 10*3/uL (ref 4.0–10.5)

## 2014-12-24 LAB — RENAL FUNCTION PANEL
ANION GAP: 11 (ref 5–15)
Albumin: 2.8 g/dL — ABNORMAL LOW (ref 3.5–5.0)
BUN: 34 mg/dL — AB (ref 6–20)
CHLORIDE: 93 mmol/L — AB (ref 101–111)
CO2: 26 mmol/L (ref 22–32)
CREATININE: 4 mg/dL — AB (ref 0.44–1.00)
Calcium: 8.9 mg/dL (ref 8.9–10.3)
GFR, EST AFRICAN AMERICAN: 11 mL/min — AB (ref >60)
GFR, EST NON AFRICAN AMERICAN: 10 mL/min — AB (ref >60)
Glucose, Bld: 101 mg/dL — ABNORMAL HIGH (ref 65–99)
PHOSPHORUS: 4.1 mg/dL (ref 2.5–4.6)
POTASSIUM: 4.6 mmol/L (ref 3.5–5.1)
Sodium: 130 mmol/L — ABNORMAL LOW (ref 135–145)

## 2014-12-24 LAB — GLUCOSE, CAPILLARY
GLUCOSE-CAPILLARY: 101 mg/dL — AB (ref 65–99)
GLUCOSE-CAPILLARY: 175 mg/dL — AB (ref 65–99)

## 2014-12-24 NOTE — Consult Note (Signed)
   [facility] CM Inpatient Consult   12/24/2014  Natalie ReaderGeorgianna Wolfe 1930-04-28 [id number] Referral received.  Went to speak with patient and she was sound asleep. Inpatient RNCM states patient had hemodialysis earlier.  Will follow up with patient regarding [facility] services at a later time.  For questions, please contact: Charlesetta ShanksVictoria Jaydi Bray, RN BSN CCM [facility]  [phone] business mobile phone

## 2014-12-24 NOTE — Progress Notes (Signed)
  [facility] Progress Note   Subjective: feels "much better" today     Filed Vitals:   12/24/14 0725 12/24/14 0800 12/24/14 0825 12/24/14 0900  BP: 151/73 139/60 146/75 164/76  Pulse: 69 69 72 75  Temp:      TempSrc:      Resp: 17 20 23 26   Weight:      SpO2:       Exam: Alert, no distress No jvd Bilat rales at bases RRR no MRG Abd soft ntnd no ascites No leg or UE edema R IJ cath, LUA AVF +bruit  HD: T[facility] 47.5 kg 3:30 2K/2.25Ca 400/800 No Heparin AVF @ LUA (using 17G), R IJ catheter Hectorol 2 mcg Mircera 150 mcg q2wk (given 5/10) No Venofer        Assessment: 1. Pulm edema - improving clinically 2. ESRD on HD 3. HTN/vol suspected vol excess from lean body wt loss 4. Syncope unclear cause 5. Anemia on esa 6. MBD cont vdra 7. DM2  Plan - HD today, repeat film after HD     Vinson Moselleob Elva Breaker MD  pager [phone]    cell [phone]  12/24/2014, 9:15 AM     Recent Labs Lab 12/22/14 0557 12/23/14 0500 12/24/14 0751  NA 133* 127* 130*  K 5.3* 5.6* 4.6  CL 93* 88* 93*  CO2 26 23 26   GLUCOSE 88 90 101*  BUN 48* 65* 34*  CREATININE 4.03* 5.57* 4.00*  CALCIUM 9.0 9.2 8.9  PHOS  --  5.0* 4.1    Recent Labs Lab 12/22/14 0557 12/23/14 0500 12/24/14 0751  AST 34  --   --   ALT 41  --   --   ALKPHOS 135*  --   --   BILITOT 0.8  --   --   PROT 6.4*  --   --   ALBUMIN 3.0* 2.9* 2.8*    Recent Labs Lab 12/21/14 1724  12/23/14 0500 12/23/14 1910 12/24/14 0751  WBC 7.9  < > 5.1 6.7 6.1  NEUTROABS 5.4  --   --   --   --   HGB 10.5*  < > 10.3* 10.3* 10.4*  HCT 31.6*  < > 31.7* 32.3* 32.8*  MCV 95.5  < > 95.2 96.1 96.8  PLT 110*  < > 146* 120* 130*  < > = values in this interval not displayed. Marland Kitchen. aspirin EC  81 mg Oral Daily  . carvedilol  6.25 mg Oral BID WC  . feeding supplement (NEPRO CARB STEADY)  237 mL Oral TID BM  . hydrALAZINE  25 mg Oral 3 times per day  . insulin aspart  0-5 Units  Subcutaneous QHS  . insulin aspart  0-9 Units Subcutaneous TID WC  . isosorbide mononitrate  30 mg Oral Daily  . mirtazapine  15 mg Oral QHS  . multivitamin  1 tablet Oral QHS  . sodium chloride  3 mL Intravenous Q12H     sodium chloride, sodium chloride, acetaminophen **OR** acetaminophen, anticoagulant sodium citrate, feeding supplement (NEPRO CARB STEADY), lidocaine (PF), lidocaine-prilocaine, ondansetron **OR** ondansetron (ZOFRAN) IV, pentafluoroprop-tetrafluoroeth

## 2014-12-24 NOTE — Discharge Summary (Signed)
Physician Discharge Summary  Natalie Wolfe WUJ:[id number] DOB: [birth year] DOA: 12/21/2014  PCP: [name], MD  Admit date: 12/21/2014 Discharge date: 12/24/2014  Time spent: 45 minutes  Recommendations for Outpatient Follow-up:  1. Stop amlodipine 2. Consider Peripheral smear and hematology referral if not done already as OP-she has TCP of unknown etiology 3. She will need significant work with therapy as an OP-HHRN ordered and 3 in one ordered  Discharge Diagnoses:  Principal Problem:   Syncope Active Problems:   DM type 2, uncontrolled, with renal complications   Essential hypertension   Anemia of chronic disease   ESRD (end stage renal disease)   Thrombocytopenia   Congestive dilated cardiomyopathy-EF 40-45% at cath, 35% by echo   Syncopal episodes   Pulmonary edema   Protein-calorie malnutrition, severe   Discharge Condition: fair  Diet recommendation: renal  Filed Weights   12/23/14 2049 12/24/14 0720 12/24/14 1102  Weight: 46.72 kg (103 lb) 46.9 kg (103 lb 6.3 oz) 46 kg (101 lb 6.6 oz)    History of present illness:  81 y/o ? ty II dm x 50 yrs, HTn, HIT, ESRD @ [facility]-actually has had a prior h/o NSTEMI 09/09/14 with VT-didn't need Life vest-at that admission was started on Coreg/Hydralazine and Imdur.  She represented to [facility] 5/14 with syncopal episode. She passed out well on the machine. This sounds strangely familiar and similar to her episode in February where she was felt to have a V. tach arrest. She does not recall anything that happened at the time other than "I hurt people laughing around it". She was sent by EMS to the hospital and was admitted. EKG performed was negative it was felt that patient probably had orthostatic vasovagal syncope She was seen by nephrology and discussion ensued regarding whether we should discontinue some of her cardiac medications or adjust her end-diastolic weight.    Hospital Course:   1. Syncope-.probably combination of  anti-htn meds and dialysis Appreciate nephrology input regarding medications and adjustment of dialysis volumes--meds have been adjusted.  Might need to dose meds on non-dialysis day?  Norvasc d/c'd 5. STEMI 09/2014-cath at that time was negative. . troponins ? from Renal disease and clinically reassuring as no over CP-she has SOB on exertion-might consider 6 min walk test and further work with therapy at home 6. Elevated troponin 2/2 to demand ischemia-lab artifact as ESRD-no CP.  EKG benign appearing-cath recently wnl  2. 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 severe malnutrition-continue supplements-reweigh-see trends Vitals - 1 value per visit  12/21/2014  10/23/2014  09/18/2014  09/17/2014  09/15/2014   Weight (lb)  105  127.4    125     Height             BMI  17.47  21.2      20.8      4. chronic anemia & thrombocytopenia-obtain peripheral smear to search for clues.HIT PANEL last admit was neg.  Her LFTs are normal so this is probably not liver related . Monitor as OP 5. HTN-see above discussion  Consultations:  Nephrology  Discharge Exam: Filed Vitals:   12/24/14 1102  BP: 154/74  Pulse: 80  Temp: 97.4 F (36.3 C)  Resp: 20    General: EOMI ncat Cardiovascular: s1 s 2no m/r/g Respiratory: clear no added sound  Discharge Instructions   Discharge Instructions    Diet - low sodium heart healthy    Complete by:  As directed      Discharge instructions    Complete by:  As directed   Please stop your amlodipine other meds can continue without change Nephrology will work on adjusting your volumes of dialysis as an outpatient. Take it slow walking aorund     Increase activity slowly    Complete by:  As directed           Current Discharge Medication List    CONTINUE these medications which have NOT CHANGED   Details  acetaminophen (TYLENOL) 325 MG tablet Take 650 mg by  mouth daily as needed for mild pain.     aspirin EC 81 MG EC tablet Take 1 tablet (81 mg total) by mouth daily. Qty: 30 tablet, Refills: 0    carvedilol (COREG) 6.25 MG tablet Take 6.25 mg by mouth 2 (two) times daily with a meal.    fluocinonide cream (LIDEX) 0.05 % Apply 1 application topically 2 (two) times daily. Apply from neck down twice a day (provided by dermatology).    glimepiride (AMARYL) 2 MG tablet Take 1 mg by mouth daily. Take 1/2 tablet by mouth once daily to control blood sugar    hydrALAZINE (APRESOLINE) 25 MG tablet Take 1 tablet (25 mg total) by mouth every 8 (eight) hours. Qty: 270 tablet, Refills: 3   Associated Diagnoses: Essential hypertension    isosorbide mononitrate (IMDUR) 30 MG 24 hr tablet Take 30 mg by mouth daily.    mirtazapine (REMERON) 15 MG tablet Take One tablet by mouth nightly to help appetite Qty: 90 tablet, Refills: 3   Associated Diagnoses: Anorexia    multivitamin (RENA-VIT) TABS tablet Take 1 tablet by mouth at bedtime. Qty: 30 each, Refills: 0    calcium acetate (PHOSLO) 667 MG capsule Take 1 capsule (667 mg total) by mouth 3 (three) times daily with meals. Qty: 90 capsule, Refills: 0    glucose blood (BAYER CONTOUR NEXT TEST) test strip Use as instructed Qty: 100 each, Refills: 12   Associated Diagnoses: Type II or unspecified type diabetes mellitus with renal manifestations, not stated as uncontrolled      STOP taking these medications     amLODipine (NORVASC) 10 MG tablet        Allergies  Allergen Reactions  . Heparin Other (See Comments)    Positive Hep Induced Plt Ab and SRA 09/12/2014      The results of significant diagnostics from this hospitalization (including imaging, microbiology, ancillary and laboratory) are listed below for reference.    Significant Diagnostic Studies: Dg Chest Port 1 View  12/24/2014   CLINICAL DATA:  81 year old female with pulmonary edema, end-stage renal disease. Initial encounter.  EXAM:  PORTABLE CHEST - 1 VIEW  COMPARISON:  12/22/2014 and earlier.  FINDINGS: Portable AP semi upright view at 1146 hrs. Stable right chest dual lumen dialysis type catheter. Interval regressed pulmonary vascularity. Continued dense retrocardiac opacity on the left. Suggestion of small pleural effusions. No pneumothorax. Stable cardiac size and mediastinal contours.  IMPRESSION: 1. Regressed pulmonary congestion/interstitial edema. 2. Left lower lobe collapse or consolidation. Small pleural effusions suspected.   Electronically Signed   By: Odessa FlemingH  Hall M.D.   On: 12/24/2014 12:06   Dg Chest Port 1 View  12/22/2014   CLINICAL DATA:  Dyspnea on exertion  EXAM: PORTABLE CHEST - 1 VIEW  COMPARISON:  Chest radiograph 09/12/2014  FINDINGS: Large-bore right central venous line unchanged. Stable enlarged cardiac silhouette there is new perihilar mild airspace disease. No pneumothorax. Small  left effusion.  IMPRESSION: New mild pulmonary edema and left effusion.  Are   Electronically Signed   By: Genevive BiStewart  Edmunds M.D.   On: 12/22/2014 15:14    Microbiology: Recent Results (from the past 240 hour(s))  MRSA PCR Screening     Status: None   Collection Time: 12/22/14  5:58 AM  Result Value Ref Range Status   MRSA by PCR NEGATIVE NEGATIVE Final    Comment:        The GeneXpert MRSA Assay (FDA approved for NASAL specimens only), is one component of a comprehensive MRSA colonization surveillance program. It is not intended to diagnose MRSA infection nor to guide or monitor treatment for MRSA infections.      Labs: Basic Metabolic Panel:  Recent Labs Lab 12/21/14 1724 12/22/14 0557 12/23/14 0500 12/24/14 0751  NA 131* 133* 127* 130*  K 4.5 5.3* 5.6* 4.6  CL 92* 93* 88* 93*  CO2 28 26 23 26   GLUCOSE 121* 88 90 101*  BUN 39* 48* 65* 34*  CREATININE 3.08* 4.03* 5.57* 4.00*  CALCIUM 8.6* 9.0 9.2 8.9  PHOS  --   --  5.0* 4.1   Liver Function Tests:  Recent Labs Lab 12/22/14 0557 12/23/14 0500  12/24/14 0751  AST 34  --   --   ALT 41  --   --   ALKPHOS 135*  --   --   BILITOT 0.8  --   --   PROT 6.4*  --   --   ALBUMIN 3.0* 2.9* 2.8*   No results for input(s): LIPASE, AMYLASE in the last 168 hours. No results for input(s): AMMONIA in the last 168 hours. CBC:  Recent Labs Lab 12/21/14 1724 12/22/14 0557 12/23/14 0500 12/23/14 1910 12/24/14 0751  WBC 7.9 6.2 5.1 6.7 6.1  NEUTROABS 5.4  --   --   --   --   HGB 10.5* 10.2* 10.3* 10.3* 10.4*  HCT 31.6* 31.1* 31.7* 32.3* 32.8*  MCV 95.5 96.9 95.2 96.1 96.8  PLT 110* 116* 146* 120* 130*   Cardiac Enzymes:  Recent Labs Lab 12/21/14 1724 12/22/14 0609  TROPONINI 0.07* 0.09*   BNP: BNP (last 3 results) No results for input(s): BNP in the last 8760 hours.  ProBNP (last 3 results) No results for input(s): PROBNP in the last 8760 hours.  CBG:  Recent Labs Lab 12/23/14 0723 12/23/14 1350 12/23/14 1630 12/23/14 2047 12/24/14 1150  GLUCAP 98 73 102* 125* 101*       Signed:  Rhetta MuraSAMTANI, JAI-GURMUKH  [facility] 12/24/2014, 12:33 PM

## 2014-12-24 NOTE — Progress Notes (Signed)
PT Cancellation Note  Patient Details Name: Leonia ReaderGeorgianna Kimball MRN: [id number] DOB: 14-Oct-1929   Cancelled Treatment:    Reason Eval/Treat Not Completed: Patient at procedure or test/unavailable   Currently in HD;  Will follow up later today as time allows;  Otherwise, will follow up for PT tomorrow;   Thank you,  Van ClinesHolly Ruby Dilone, PT  [facility] Pager [phone] Office [phone]     Van ClinesGarrigan, Leeana Creer [facility] 12/24/2014, 8:05 AM

## 2014-12-24 NOTE — Progress Notes (Signed)
Pt. Received HD with no complications. 2.5L fluid removed. Arterial port of catheter and venous of fistula w/ 15g needle accessed. Pt. Alert, vss. Report called to primary RN. Pt. Returned to room safely.

## 2014-12-29 DIAGNOSIS — I129 Hypertensive chronic kidney disease with stage 1 through stage 4 chronic kidney disease, or unspecified chronic kidney disease: Secondary | ICD-10-CM

## 2014-12-29 DIAGNOSIS — E1151 Type 2 diabetes mellitus with diabetic peripheral angiopathy without gangrene: Secondary | ICD-10-CM

## 2014-12-29 DIAGNOSIS — E1122 Type 2 diabetes mellitus with diabetic chronic kidney disease: Secondary | ICD-10-CM

## 2014-12-29 DIAGNOSIS — N186 End stage renal disease: Secondary | ICD-10-CM

## 2015-01-02 ENCOUNTER — Telehealth: Payer: Self-pay | Admitting: *Deleted

## 2015-01-02 NOTE — Telephone Encounter (Signed)
FYI--[name] dropped off message stating that he wanted Dr. Chilton SiGreen to know that patient is not eating food. She is drinking up to 5-6 Ensure a day. The people at dialysis wanted you to know this also. Mr. Alvera NovelSatterfield DOES NOT want patient to know he told you.

## 2015-01-07 NOTE — Telephone Encounter (Signed)
Noted  

## 2015-01-15 ENCOUNTER — Ambulatory Visit: Payer: Medicare Other | Admitting: Internal Medicine

## 2015-01-20 ENCOUNTER — Encounter: Payer: Self-pay | Admitting: Vascular Surgery

## 2015-01-22 ENCOUNTER — Ambulatory Visit: Payer: Medicare Other | Admitting: Vascular Surgery

## 2015-01-24 ENCOUNTER — Encounter: Payer: Self-pay | Admitting: Vascular Surgery

## 2015-01-29 ENCOUNTER — Ambulatory Visit (INDEPENDENT_AMBULATORY_CARE_PROVIDER_SITE_OTHER): Payer: Medicare Other | Admitting: Vascular Surgery

## 2015-01-29 ENCOUNTER — Other Ambulatory Visit: Payer: Self-pay

## 2015-01-29 VITALS — BP 159/62 | HR 88 | Ht 65.0 in | Wt 103.3 lb

## 2015-01-29 DIAGNOSIS — N186 End stage renal disease: Secondary | ICD-10-CM

## 2015-01-29 NOTE — Progress Notes (Signed)
[facility]   Patient name: Natalie Wolfe MRN: [id number] DOB: 06/09/1930 Sex: female  REASON FOR VISIT: to evaluate her left brachiocephalic AV fistula. Referred by Dr. Paulene Floor  HPI: Natalie Wolfe is a 81 y.o. female who had a left upper arm AV fistula (brachiocephalic fistula) and placement of a tunneled dialysis catheter placed on 08/31/2014. She has been using this for dialysis. She is confused and there is no family present with her today. She's not even sure what day she dialyzes. She was sent with a fistulogram which I believe was performed on 01/17/2015. This shows that she had some outflow stenosis in her cephalic vein which was addressed with venoplasty. She also had venoplasty of the proximal fistula and it looks like she developed some extravasation and a pseudoaneurysm. We are trying to obtain the dictated report.   She does not have any significant pain associated with her fistula and has no pain or paresthesias in her left upper extremity.  Past Medical History  Diagnosis Date  . Urinary frequency   . Routine general medical examination at a health care facility   . Other atopic dermatitis and related conditions   . Rash and other nonspecific skin eruption   . Other anxiety states   . Insomnia, unspecified   . Chronic kidney disease, stage II (mild)   . Type II or unspecified type diabetes mellitus with renal manifestations, not stated as uncontrolled   . PVD (peripheral vascular disease)   . Unspecified vitamin D deficiency   . Anemia, unspecified   . Obesity, unspecified   . Nocturia   . Pain in joint, lower leg   . Other and unspecified hyperlipidemia   . Unspecified essential hypertension   . Cataract    Family History  Problem Relation Age of Onset  . Diabetes Mother    SOCIAL HISTORY: History  Substance Use Topics  . Smoking status: Former Smoker    Types: Cigarettes    Quit date: 08/10/2000  . Smokeless  tobacco: Not on file  . Alcohol Use: No   Allergies  Allergen Reactions  . Heparin Other (See Comments)    Positive Hep Induced Plt Ab and SRA 09/12/2014   Current Outpatient Prescriptions  Medication Sig Dispense Refill  . acetaminophen (TYLENOL) 325 MG tablet Take 650 mg by mouth daily as needed for mild pain.     Marland Kitchen aspirin EC 81 MG EC tablet Take 1 tablet (81 mg total) by mouth daily. 30 tablet 0  . carvedilol (COREG) 6.25 MG tablet Take 6.25 mg by mouth 2 (two) times daily with a meal.    . fluocinonide cream (LIDEX) 0.05 % Apply 1 application topically 2 (two) times daily. Apply from neck down twice a day (provided by dermatology).    Marland Kitchen glimepiride (AMARYL) 2 MG tablet Take 1 mg by mouth daily. Take 1/2 tablet by mouth once daily to control blood sugar    . glucose blood (BAYER CONTOUR NEXT TEST) test strip Use as instructed 100 each 12  . hydrALAZINE (APRESOLINE) 25 MG tablet Take 1 tablet (25 mg total) by mouth every 8 (eight) hours. 270 tablet 3  . isosorbide mononitrate (IMDUR) 30 MG 24 hr tablet Take 30 mg by mouth daily.    . mirtazapine (REMERON) 15 MG tablet Take One tablet by mouth nightly to help appetite (Patient taking differently: Take 15 mg by mouth at bedtime. Take One tablet by mouth nightly to help appetite) 90 tablet 3  .  multivitamin (RENA-VIT) TABS tablet Take 1 tablet by mouth at bedtime. 30 each 0  . calcium acetate (PHOSLO) 667 MG capsule Take 1 capsule (667 mg total) by mouth 3 (three) times daily with meals. (Patient not taking: Reported on 12/21/2014) 90 capsule 0   No current facility-administered medications for this visit.   REVIEW OF SYSTEMS: Arly.Keller ] denotes positive finding; [  ] denotes negative finding  CARDIOVASCULAR:   chest pain    chest pressure    palpitations   Arly.Keller ] orthopnea    dyspnea on exertion    claudication    rest pain    DVT    phlebitis PULMONARY:    productive cough    asthma    wheezing NEUROLOGIC:     weakness   paresthesias   aphasia   amaurosis   dizziness HEMATOLOGIC:    bleeding problems    clotting disorders MUSCULOSKELETAL:   joint pain    joint swelling Arly.Keller ] leg swelling GASTROINTESTINAL:   blood in stool    hematemesis GENITOURINARY:    dysuria    hematuria PSYCHIATRIC:   history of major depression INTEGUMENTARY:   rashes   ulcers CONSTITUTIONAL:   fever    chills  PHYSICAL EXAM: Filed Vitals:   01/29/15 0824  BP: 159/62  Pulse: 88  Height:  (1.651 m)  Weight: 103 lb 4.8 oz (46.857 kg)  SpO2: 100%   GENERAL: The patient is a well-nourished female, in no acute distress. The vital signs are documented above. CARDIOVASCULAR: There is a regular rate and rhythm. She has a palpable left radial pulse. She has an excellent thrill in her left upper arm fistula. There is a pseudoaneurysm in the mid upper arm. PULMONARY: There is good air exchange bilaterally without wheezing or rales. ABDOMEN: Soft and non-tender with normal pitched bowel sounds.  MUSCULOSKELETAL: There are no major deformities or cyanosis. NEUROLOGIC: No focal weakness or paresthesias are detected. SKIN: There are no ulcers or rashes noted. PSYCHIATRIC: The patient has a normal affect.  DATA:  I have reviewed her fistulogram that was sent with her the results are described above.  MEDICAL ISSUES: PSEUDOANEURYSM OF LEFT BRACHIOCEPHALIC AV FISTULA: The patient has a significant pseudoaneurysm of her left brachiocephalic AV fistula. I think that this could compromise of flow in the fistula and also limits the areas that can be cannulated. In addition this is at risk for enlarging and becoming even more problematic. This reason I recommended exploration of the pseudoaneurysm and repair. This may require patch angioplasty. She thinks that she dialyzes on Tuesdays Thursdays and Saturdays so we will arrange to have this done on Friday, 01/31/2015. She'll use her  catheter for now for dialysis.   Waverly Ferrari [facility] [place] Beeper: [phone]

## 2015-01-30 ENCOUNTER — Encounter (HOSPITAL_COMMUNITY): Payer: Self-pay | Admitting: *Deleted

## 2015-01-30 ENCOUNTER — Telehealth: Payer: Self-pay | Admitting: *Deleted

## 2015-01-30 ENCOUNTER — Encounter (HOSPITAL_COMMUNITY): Payer: Self-pay | Admitting: Emergency Medicine

## 2015-01-30 MED ORDER — NEPRO/CARBSTEADY PO LIQD: 237.0000 mL | ORAL | Status: DC | PRN

## 2015-01-30 MED ORDER — LIDOCAINE-PRILOCAINE 2.5-2.5 % EX CREA: 1.0000 "application " | TOPICAL_CREAM | CUTANEOUS | Status: DC | PRN

## 2015-01-30 MED ORDER — PENTAFLUOROPROP-TETRAFLUOROETH EX AERO: 1.0000 "application " | INHALATION_SPRAY | CUTANEOUS | Status: DC | PRN

## 2015-01-30 MED ORDER — LIDOCAINE HCL (PF) 1 % IJ SOLN: 5.0000 mL | INTRAMUSCULAR | Status: DC | PRN

## 2015-01-30 MED ORDER — SODIUM CHLORIDE 0.9 % IV SOLN: 100.0000 mL | INTRAVENOUS | Status: DC | PRN

## 2015-01-30 MED ORDER — ALTEPLASE 2 MG IJ SOLR: 2.0000 mg | Freq: Once | INTRAMUSCULAR | Status: DC | PRN

## 2015-01-30 NOTE — Telephone Encounter (Signed)
I think this is something I need to discuss with her next week when she comes in for her appt.

## 2015-01-30 NOTE — Progress Notes (Signed)
Natalie Wolfe reported missing dialysis because she thought she was coming to surgery today.

## 2015-01-30 NOTE — Progress Notes (Signed)
Surgical time changed, patient needs to arrive at 10:45.  Patient transfers via [facility] transportation.  I called the transportation office and spoke with Archie Patten and she said someone will pick patient up at 0945 (20 minute window either way).. I called and informed Natalie Wolfe.

## 2015-01-30 NOTE — Telephone Encounter (Signed)
Patient called and stated that she does not think the Mirtazapine you gave her for appitite is working because she looks like a Education administrator. She does have an appointment on 02/05/2015. Please Advise.

## 2015-01-30 NOTE — Telephone Encounter (Signed)
Patient notified and agreed. Confirmed appointments with patient.

## 2015-01-30 NOTE — Progress Notes (Signed)
Anesthesia Chart Review:  Pt is 81 year old female scheduled for repair of L AV fistula pseudoaneurysm on 01/31/2015 with Dr. Edilia Bo.   Pt is same day work up.   PCP is Dr. Murray Hodgkins  PMH includes: CHF, HTN, PVD, anemia, DM, ESRD (on dialysis), hyperlipidemia, thrombocytopenia. Former smoker. BMI 17.  S/p AV fistula creation 09/06/14.   Medications include: ASA, carvedilol, glimepiride, hydralazine, imdur, remeron.   Pt hospitalized 2/4-2/05/2015 for cardiopulmonary arrest in the setting of dialysis. Pt does not appear to have followed up with cardiology as recommended.   An Istat 4 will be obtained DOS as ordered by Dr. Edilia Bo. I am going to add a CBC as pt has history of thrombocytopenia.   1 view CXR 12/24/2014 reviewed:  1. Regressed pulmonary congestion/interstitial edema. 2. Left lower lobe collapse or consolidation. Small pleural effusions suspected.  EKG 12/24/2014: NSR. Possible Left atrial enlargement. Left ventricular hypertrophy with repolarization abnormality  Echo 09/13/2014: - Left ventricle: The cavity size was normal. Wall thickness was normal. Systolic function was moderately to severely reduced. The estimated ejection fraction was 35%. Diffuse hypokinesis. Doppler parameters are consistent with abnormal left ventricular relaxation (grade 1 diastolic dysfunction). Doppler parameters are consistent with high ventricular filling pressure. -Mitral valve: Calcified annulus. There was mild regurgitation. - Left atrium: The atrium was mildly dilated. - Pulmonary arteries: Systolic pressure was mildly increased. PApeak pressure: 32 mm Hg (S). - Pericardium, extracardiac: A small pericardial effusion was identified. -Impressions: Moderate to severe global reduction in LV function; grade 1diastolic dysfunction; mild LAE; mild MR and TR; mildly elevatedpulmonary pressure; small pericardial effusion.  Cardiac cath 09/17/2014: -Mild global LV dysfunction with an ejection  fraction of 40-45% without focal segmental wall motion abnormalities, and evidence for left ventricular hypertrophy. -No significant coronary obstructive disease with mild coronary calcification involving the left main and LAD with 20 -30% smooth left main stenosis.  Pt will need to be assessed by her assigned anesthesiologist DOS.   Rica Mast, FNP-BC [facility]/Anesthesiology Phone: [phone] 01/30/2015 4:01 PM

## 2015-01-31 ENCOUNTER — Non-Acute Institutional Stay (HOSPITAL_COMMUNITY)
Admission: RE | Admit: 2015-01-31 | Discharge: 2015-01-31 | Disposition: A | Discharge status: Home / Self Care | Payer: Medicare Other | Source: Ambulatory Visit | Attending: Vascular Surgery | Admitting: Vascular Surgery

## 2015-01-31 ENCOUNTER — Encounter (HOSPITAL_COMMUNITY)
Admission: RE | Disposition: A | Discharge status: Home / Self Care | Payer: Self-pay | Source: Ambulatory Visit | Attending: Vascular Surgery

## 2015-01-31 ENCOUNTER — Encounter (HOSPITAL_COMMUNITY): Payer: Self-pay | Admitting: *Deleted

## 2015-01-31 DIAGNOSIS — E875 Hyperkalemia: Secondary | ICD-10-CM | POA: Diagnosis present

## 2015-01-31 DIAGNOSIS — Z79899 Other long term (current) drug therapy: Secondary | ICD-10-CM | POA: Insufficient documentation

## 2015-01-31 DIAGNOSIS — R109 Unspecified abdominal pain: Secondary | ICD-10-CM | POA: Diagnosis not present

## 2015-01-31 DIAGNOSIS — I120 Hypertensive chronic kidney disease with stage 5 chronic kidney disease or end stage renal disease: Secondary | ICD-10-CM | POA: Diagnosis not present

## 2015-01-31 DIAGNOSIS — E785 Hyperlipidemia, unspecified: Secondary | ICD-10-CM | POA: Diagnosis not present

## 2015-01-31 DIAGNOSIS — D696 Thrombocytopenia, unspecified: Secondary | ICD-10-CM | POA: Insufficient documentation

## 2015-01-31 DIAGNOSIS — Z7982 Long term (current) use of aspirin: Secondary | ICD-10-CM | POA: Insufficient documentation

## 2015-01-31 DIAGNOSIS — Z992 Dependence on renal dialysis: Secondary | ICD-10-CM | POA: Insufficient documentation

## 2015-01-31 DIAGNOSIS — Z87891 Personal history of nicotine dependence: Secondary | ICD-10-CM | POA: Insufficient documentation

## 2015-01-31 DIAGNOSIS — N186 End stage renal disease: Secondary | ICD-10-CM | POA: Diagnosis not present

## 2015-01-31 DIAGNOSIS — Z5309 Procedure and treatment not carried out because of other contraindication: Secondary | ICD-10-CM | POA: Diagnosis not present

## 2015-01-31 DIAGNOSIS — E1122 Type 2 diabetes mellitus with diabetic chronic kidney disease: Secondary | ICD-10-CM | POA: Diagnosis not present

## 2015-01-31 DIAGNOSIS — I739 Peripheral vascular disease, unspecified: Secondary | ICD-10-CM | POA: Insufficient documentation

## 2015-01-31 DIAGNOSIS — I509 Heart failure, unspecified: Secondary | ICD-10-CM | POA: Diagnosis not present

## 2015-01-31 DIAGNOSIS — T82898A Other specified complication of vascular prosthetic devices, implants and grafts, initial encounter: Secondary | ICD-10-CM | POA: Insufficient documentation

## 2015-01-31 HISTORY — DX: Thrombocytopenia, unspecified: D69.6

## 2015-01-31 HISTORY — DX: Heart failure, unspecified: I50.9

## 2015-01-31 LAB — BASIC METABOLIC PANEL
Anion gap: 15 (ref 5–15)
Anion gap: 16 — ABNORMAL HIGH (ref 5–15)
BUN: 97 mg/dL — AB (ref 6–20)
BUN: 96 mg/dL — ABNORMAL HIGH (ref 6–20)
CALCIUM: 9.3 mg/dL (ref 8.9–10.3)
CO2: 27 mmol/L (ref 22–32)
CO2: 26 mmol/L (ref 22–32)
CREATININE: 7.18 mg/dL — AB (ref 0.44–1.00)
Calcium: 9.2 mg/dL (ref 8.9–10.3)
Chloride: 87 mmol/L — ABNORMAL LOW (ref 101–111)
Chloride: 89 mmol/L — ABNORMAL LOW (ref 101–111)
Creatinine, Ser: 7.2 mg/dL — ABNORMAL HIGH (ref 0.44–1.00)
GFR calc Af Amer: 5 mL/min — ABNORMAL LOW (ref >60)
GFR calc Af Amer: 5 mL/min — ABNORMAL LOW (ref >60)
GFR calc non Af Amer: 5 mL/min — ABNORMAL LOW (ref >60)
GFR calc non Af Amer: 5 mL/min — ABNORMAL LOW (ref >60)
GLUCOSE: 109 mg/dL — AB (ref 65–99)
Glucose, Bld: 111 mg/dL — ABNORMAL HIGH (ref 65–99)
Potassium: >7.5 mmol/L (ref 3.5–5.1)
Potassium: >7.5 mmol/L (ref 3.5–5.1)
Sodium: 129 mmol/L — ABNORMAL LOW (ref 135–145)
Sodium: 131 mmol/L — ABNORMAL LOW (ref 135–145)

## 2015-01-31 LAB — POCT I-STAT, CHEM 8
BUN: 44 mg/dL — ABNORMAL HIGH (ref 6–20)
BUN: 26 mg/dL — ABNORMAL HIGH (ref 6–20)
CREATININE: 2.4 mg/dL — AB (ref 0.44–1.00)
Calcium, Ion: 1.16 mmol/L (ref 1.13–1.30)
Calcium, Ion: 1.13 mmol/L (ref 1.13–1.30)
Chloride: 95 mmol/L — ABNORMAL LOW (ref 101–111)
Chloride: 95 mmol/L — ABNORMAL LOW (ref 101–111)
Creatinine, Ser: 4 mg/dL — ABNORMAL HIGH (ref 0.44–1.00)
Glucose, Bld: 101 mg/dL — ABNORMAL HIGH (ref 65–99)
Glucose, Bld: 97 mg/dL (ref 65–99)
HCT: 38 % (ref 36.0–46.0)
HCT: 37 % (ref 36.0–46.0)
Hemoglobin: 12.9 g/dL (ref 12.0–15.0)
Hemoglobin: 12.6 g/dL (ref 12.0–15.0)
POTASSIUM: 3.7 mmol/L (ref 3.5–5.1)
Potassium: 4.5 mmol/L (ref 3.5–5.1)
SODIUM: 134 mmol/L — AB (ref 135–145)
SODIUM: 135 mmol/L (ref 135–145)
TCO2: 25 mmol/L (ref 0–100)
TCO2: 26 mmol/L (ref 0–100)

## 2015-01-31 LAB — POCT I-STAT 4, (NA,K, GLUC, HGB,HCT)
GLUCOSE: 116 mg/dL — AB (ref 65–99)
GLUCOSE: 109 mg/dL — AB (ref 65–99)
HCT: 39 % (ref 36.0–46.0)
HEMATOCRIT: 38 % (ref 36.0–46.0)
HEMOGLOBIN: 13.3 g/dL (ref 12.0–15.0)
HEMOGLOBIN: 12.9 g/dL (ref 12.0–15.0)
POTASSIUM: 7.6 mmol/L — AB (ref 3.5–5.1)
Potassium: 7.9 mmol/L (ref 3.5–5.1)
SODIUM: 127 mmol/L — AB (ref 135–145)
Sodium: 127 mmol/L — ABNORMAL LOW (ref 135–145)

## 2015-01-31 LAB — CBC
HCT: 34.9 % — ABNORMAL LOW (ref 36.0–46.0)
Hemoglobin: 11.5 g/dL — ABNORMAL LOW (ref 12.0–15.0)
MCH: 32 pg (ref 26.0–34.0)
MCHC: 33 g/dL (ref 30.0–36.0)
MCV: 97.2 fL (ref 78.0–100.0)
Platelets: 165 10*3/uL (ref 150–400)
RBC: 3.59 MIL/uL — ABNORMAL LOW (ref 3.87–5.11)
RDW: 16.1 % — AB (ref 11.5–15.5)
WBC: 6 10*3/uL (ref 4.0–10.5)

## 2015-01-31 LAB — GLUCOSE, CAPILLARY
GLUCOSE-CAPILLARY: 111 mg/dL — AB (ref 65–99)
Glucose-Capillary: 118 mg/dL — ABNORMAL HIGH (ref 65–99)

## 2015-01-31 SURGERY — RESECTION OF ARTERIOVENOUS FISTULA ANEURYSM
Anesthesia: Choice | Laterality: Left

## 2015-01-31 MED ORDER — CHLORHEXIDINE GLUCONATE CLOTH 2 % EX PADS
6.0000 | MEDICATED_PAD | Freq: Once | CUTANEOUS | Status: DC
Start: 2015-01-31 — End: 2015-01-31

## 2015-01-31 MED ORDER — ALTEPLASE 2 MG IJ SOLR
2.0000 mg | Freq: Once | INTRAMUSCULAR | Status: DC | PRN
  Filled 2015-01-31: qty 2

## 2015-01-31 MED ORDER — SODIUM CHLORIDE 0.9 % IV SOLN: 100.0000 mL | INTRAVENOUS | Status: DC | PRN

## 2015-01-31 MED ORDER — DOXERCALCIFEROL 4 MCG/2ML IV SOLN
2.0000 ug | Freq: Once | INTRAVENOUS | Status: AC
  Administered 2015-01-31: 2 ug via INTRAVENOUS

## 2015-01-31 MED ORDER — DEXTROSE 5 % IV SOLN: 1.5000 g | INTRAVENOUS | Status: DC

## 2015-01-31 MED ORDER — FENTANYL CITRATE (PF) 250 MCG/5ML IJ SOLN
INTRAMUSCULAR | Status: AC
  Filled 2015-01-31: qty 5

## 2015-01-31 MED ORDER — PENTAFLUOROPROP-TETRAFLUOROETH EX AERO
1.0000 "application " | INHALATION_SPRAY | CUTANEOUS | Status: DC | PRN
  Filled 2015-01-31: qty 30

## 2015-01-31 MED ORDER — NEPRO/CARBSTEADY PO LIQD
237.0000 mL | ORAL | Status: DC | PRN
  Filled 2015-01-31: qty 237

## 2015-01-31 MED ORDER — LIDOCAINE-PRILOCAINE 2.5-2.5 % EX CREA
1.0000 "application " | TOPICAL_CREAM | CUTANEOUS | Status: DC | PRN
  Filled 2015-01-31: qty 5

## 2015-01-31 MED ORDER — DOXERCALCIFEROL 4 MCG/2ML IV SOLN
INTRAVENOUS | Status: AC
  Filled 2015-01-31: qty 2

## 2015-01-31 MED ORDER — SODIUM CHLORIDE 0.9 % IV SOLN: INTRAVENOUS | Status: DC

## 2015-01-31 MED ORDER — PROPOFOL 10 MG/ML IV BOLUS
INTRAVENOUS | Status: AC
  Filled 2015-01-31: qty 20

## 2015-01-31 MED ORDER — LIDOCAINE HCL (PF) 1 % IJ SOLN
5.0000 mL | INTRAMUSCULAR | Status: DC | PRN
  Filled 2015-01-31: qty 5

## 2015-01-31 NOTE — Progress Notes (Signed)
Report called to dialysis unit and pt transferred with monitor and RN.

## 2015-01-31 NOTE — Procedures (Signed)
Patient was seen on dialysis and the procedure was supervised.  BFR 400  Via PC BP is  167/87.   Patient appears to be tolerating treatment well- needed emergent HD for a high K - her surgery was cancelled- found out that she was consuming 4 ensure per day and that is the likely reason for her high K- quick HD today- then to be discharged-  then to go to her OP center tomorrow  Natalie Wolfe A 01/31/2015

## 2015-01-31 NOTE — Progress Notes (Addendum)
Istat drawn and repeated as K+ was 7.6 and then 7.9 with a sodium of 127.  Bmet sent to lab for vari fication. Dr Edilia Bo notified and stated surgery would be canceled and Lelon Mast would be in to see pt.   Spoke with Doreatha Massed, PA and she stated they have referred Ms Dolloff to Dr Kathrene Bongo who will be her to see her. Bmet still pending  Labs confirmed with K+ greater than 7.5 and sodium 129. Pt c/o abdominal pain and reported this to Dr Kathrene Bongo.   Dr Kathrene Bongo states will start dialysis now. Report called.

## 2015-01-31 NOTE — Progress Notes (Signed)
Pt lives alone and does not have anyone that can stay with her tonight.  Her cousin is with her but he is a theraputic foster parent with a severely autistic child and he is not able to care for both pt and this child.    Pt very unclear as to when she has taken any medications and states she does not like taking her pills on an empty stomach and so has just not taken them because she has no appetite.

## 2015-02-03 ENCOUNTER — Other Ambulatory Visit: Payer: Medicare Other

## 2015-02-05 ENCOUNTER — Ambulatory Visit: Payer: Medicare Other | Admitting: Internal Medicine

## 2015-02-05 ENCOUNTER — Encounter: Payer: Self-pay | Admitting: Internal Medicine

## 2015-02-05 ENCOUNTER — Ambulatory Visit (INDEPENDENT_AMBULATORY_CARE_PROVIDER_SITE_OTHER): Payer: Medicare Other | Admitting: Internal Medicine

## 2015-02-05 VITALS — BP 142/80 | HR 88 | Resp 16 | Ht 65.0 in | Wt 103.0 lb

## 2015-02-05 DIAGNOSIS — N186 End stage renal disease: Secondary | ICD-10-CM

## 2015-02-05 DIAGNOSIS — I420 Dilated cardiomyopathy: Secondary | ICD-10-CM | POA: Diagnosis not present

## 2015-02-05 DIAGNOSIS — I10 Essential (primary) hypertension: Secondary | ICD-10-CM | POA: Diagnosis not present

## 2015-02-05 DIAGNOSIS — D638 Anemia in other chronic diseases classified elsewhere: Secondary | ICD-10-CM

## 2015-02-05 DIAGNOSIS — F32A Depression, unspecified: Secondary | ICD-10-CM

## 2015-02-05 DIAGNOSIS — R630 Anorexia: Secondary | ICD-10-CM

## 2015-02-05 DIAGNOSIS — E1165 Type 2 diabetes mellitus with hyperglycemia: Secondary | ICD-10-CM | POA: Diagnosis not present

## 2015-02-05 DIAGNOSIS — F329 Major depressive disorder, single episode, unspecified: Secondary | ICD-10-CM | POA: Diagnosis not present

## 2015-02-05 DIAGNOSIS — E43 Unspecified severe protein-calorie malnutrition: Secondary | ICD-10-CM

## 2015-02-05 DIAGNOSIS — E785 Hyperlipidemia, unspecified: Secondary | ICD-10-CM

## 2015-02-05 DIAGNOSIS — E1129 Type 2 diabetes mellitus with other diabetic kidney complication: Secondary | ICD-10-CM

## 2015-02-05 DIAGNOSIS — IMO0002 Reserved for concepts with insufficient information to code with codable children: Secondary | ICD-10-CM

## 2015-02-05 DIAGNOSIS — R413 Other amnesia: Secondary | ICD-10-CM

## 2015-02-05 MED ORDER — SERTRALINE HCL 50 MG PO TABS: 50.0000 mg | ORAL_TABLET | Freq: Every day | ORAL | Status: DC

## 2015-02-05 MED ORDER — CARVEDILOL 12.5 MG PO TABS: ORAL_TABLET | ORAL | Status: DC

## 2015-02-05 NOTE — Progress Notes (Signed)
Patient ID: Natalie Wolfe, female   DOB: [birth year], 81 y.o.   MRN: [id number]    Facility  [facility]    Place of Service:   OFFICE    Allergies  Allergen Reactions  . Heparin Other (See Comments)    Positive Hep Induced Plt Ab and SRA 09/12/2014    Chief Complaint  Patient presents with  . Medical Management of Chronic Issues    3 month follow-up: On dialysis and decreased appetite. No food today only drink   . Medication Refill    Discuss ASA, patient is no longer taking. Patient questions if she needs ASA   . Arm Problem    Examine left arm, patient with raised area related to dialysis. Patient was told nothing to be done but would like for Dr.Estanislado Surgeon to examine  . Fall    Patient with frequent falls, refuses PT    HPI:  Hospitalized 12/21/2014 through 12/24/14 for syncope. Discharge instructions suggested she should stop her amlodipine. Other diagnoses included anemia and diabetes.  Protein-calorie malnutrition, severe: Weight in March 2016 was 127 pounds. At the end of her hospital stay in May 2016 she underwent 96 pounds. She has continued to drink dietary supplements like insurer or boost. Family says this is costing a lot of money because she drank so many. Patient's appetite is markedly diminished. Her weight has improved to 103 pounds now.  Anorexia: Poor appetite went to dietary insufficiencies, weight loss, and severe protein calorie malnutrition.  ESRD (end stage renal disease): Major contributing factor to her anorexia and weight loss. Continues to report to dialysis 3 times weekly. She finds herself quite weak at the end of her treatments.  Anemia of chronic disease: Chronic anemia which may well be related to her ESRD.  Essential hypertension: Home pressures are all within normal range. Systolic blood pressure was 142 the office today. He denies headache, chest pain, palpitations  DM type 2, uncontrolled, with renal complications: Controlled  Hyperlipemia:  Controlled  Congestive dilated cardiomyopathy-EF 40-45% at cath, 35% by echo - Plan: carvedilol (COREG) 12.5 MG tablet twice a day was to have been started at the end of her last hospital stay. She ran out of medication, and has not refilled the drug in greater than one week.  Depression - patient is aware that she is depressed. She is willing to try antidepressives.  Memory loss: The also be accompanied by personality change. Patient is having difficulty managing medicines and being able to report when she has taken them in a reliable fashion family is trying to assist, but she quarrels with them at times.  Medications: Patient's Medications  New Prescriptions   No medications on file  Previous Medications   ACETAMINOPHEN (TYLENOL) 500 MG TABLET    Take 500 mg by mouth as needed.   AMLODIPINE (NORVASC) 5 MG TABLET    Take 5 mg by mouth daily.   ENSURE (ENSURE)    Take 237 mLs by mouth 3 (three) times daily between meals.   FLUOCINONIDE CREAM (LIDEX) 0.05 %    Apply 1 application topically 2 (two) times daily. Apply from neck down twice a day (provided by dermatology).   GLIMEPIRIDE (AMARYL) 2 MG TABLET    Take 1 mg by mouth daily. Take 1/2 tablet by mouth once daily to control blood sugar   GLUCOSE BLOOD (BAYER CONTOUR NEXT TEST) TEST STRIP    Use as instructed   HYDRALAZINE (APRESOLINE) 25 MG TABLET    Take 1 tablet (25 mg  total) by mouth every 8 (eight) hours.   LIDOCAINE-PRILOCAINE (EMLA) CREAM    Apply 1 application topically as needed.   MIRTAZAPINE (REMERON) 15 MG TABLET    Take One tablet by mouth nightly to help appetite  Modified Medications   No medications on file  Discontinued Medications   ACETAMINOPHEN (TYLENOL) 325 MG TABLET    Take 650 mg by mouth daily as needed for mild pain.    ASPIRIN EC 81 MG EC TABLET    Take 1 tablet (81 mg total) by mouth daily.   CALCIUM ACETATE (PHOSLO) 667 MG CAPSULE    Take 1 capsule (667 mg total) by mouth 3 (three) times daily with meals.    MULTIVITAMIN (RENA-VIT) TABS TABLET    Take 1 tablet by mouth at bedtime.     Review of Systems  Constitutional: Positive for activity change, appetite change and fatigue. Negative for fever and chills.       This lost a lot of weight since I last saw the patient. This is related to anorexia and extended period of time in the hospital prior to this visit.  HENT: Negative for congestion and mouth sores.        Dentures present  Eyes:       Corrective lenses  Respiratory: Negative for cough, chest tightness and shortness of breath.   Cardiovascular: Positive for leg swelling. Negative for chest pain.       Cardiopulmonary arrest in February 2016  Gastrointestinal: Negative for abdominal pain and constipation.  Endocrine:       Diabetic with renal disease.  Genitourinary: Positive for urgency and frequency. Negative for dysuria and flank pain.  Skin:       Chronic eczema causes itching  Neurological: Negative for dizziness, syncope and light-headedness.       Increasing memory problems since January 2016.  Hematological: Negative for adenopathy.       History of chronic anemia  Psychiatric/Behavioral: Positive for confusion, sleep disturbance, dysphoric mood and decreased concentration. Negative for behavioral problems and agitation. The patient is nervous/anxious.     Filed Vitals:   02/05/15 1208  BP: 142/80  Pulse: 88  Resp: 16  Height: 5' 5"  (1.651 m)  Weight: 103 lb (46.72 kg)  SpO2: 98%   Body mass index is 17.14 kg/(m^2).  Physical Exam  Constitutional: She is oriented to person, place, and time. No distress.  Frail, elderly female  HENT:  Head: Normocephalic and atraumatic.  Mouth/Throat: Oropharynx is clear and moist. No oropharyngeal exudate.  Upper and lower dentures  Eyes: Conjunctivae and EOM are normal. Pupils are equal, round, and reactive to light.  Neck: Normal range of motion. Neck supple. No JVD present. No tracheal deviation present. No thyromegaly  present.  Cardiovascular: Normal rate, regular rhythm and normal heart sounds.   No murmur heard. Pulmonary/Chest: Effort normal and breath sounds normal. No respiratory distress. She has no wheezes. She has no rales.  Abdominal: Soft. Bowel sounds are normal. She exhibits no distension and no mass. There is no tenderness.  Musculoskeletal: Normal range of motion. She exhibits edema (1+ both feet). She exhibits no tenderness.  Lymphadenopathy:    She has no cervical adenopathy.  Neurological: She is alert and oriented to person, place, and time. No cranial nerve deficit. Coordination normal.  Diminished sensation to vibration and monofilament testing bilaterally.  Skin: Skin is warm and dry. No rash noted. She is not diaphoretic.  Chronic eczematous changes. Thinning of skin.  Psychiatric: She  has a normal mood and affect. Her behavior is normal.     Labs reviewed: Admission on 01/31/2015, Discharged on 01/31/2015  Component Date Value Ref Range Status  . WBC 01/31/2015 6.0  4.0 - 10.5 K/uL Final  . RBC 01/31/2015 3.59* 3.87 - 5.11 MIL/uL Final  . Hemoglobin 01/31/2015 11.5* 12.0 - 15.0 g/dL Final  . HCT 01/31/2015 34.9* 36.0 - 46.0 % Final  . MCV 01/31/2015 97.2  78.0 - 100.0 fL Final  . MCH 01/31/2015 32.0  26.0 - 34.0 pg Final  . MCHC 01/31/2015 33.0  30.0 - 36.0 g/dL Final  . RDW 01/31/2015 16.1* 11.5 - 15.5 % Final  . Platelets 01/31/2015 165  150 - 400 K/uL Final  . Glucose-Capillary 01/31/2015 118* 65 - 99 mg/dL Final  . Sodium 01/31/2015 129* 135 - 145 mmol/L Final  . Potassium 01/31/2015 >7.5* 3.5 - 5.1 mmol/L Final   Comment: NO VISIBLE HEMOLYSIS REPEATED TO VERIFY CRITICAL RESULT CALLED TO, READ BACK BY AND VERIFIED WITH: K.KENSMOE,RN 1130 01/31/15 CLARK,S   . Chloride 01/31/2015 87* 101 - 111 mmol/L Final  . CO2 01/31/2015 27  22 - 32 mmol/L Final  . Glucose, Bld 01/31/2015 109* 65 - 99 mg/dL Final  . BUN 01/31/2015 97* 6 - 20 mg/dL Final  . Creatinine, Ser  01/31/2015 7.18* 0.44 - 1.00 mg/dL Final  . Calcium 01/31/2015 9.3  8.9 - 10.3 mg/dL Final  . GFR calc non Af Amer 01/31/2015 5* >60 mL/min Final  . GFR calc Af Amer 01/31/2015 5* >60 mL/min Final   Comment: (NOTE) The eGFR has been calculated using the CKD EPI equation. This calculation has not been validated in all clinical situations. eGFR's persistently <60 mL/min signify possible Chronic Kidney Disease.   . Anion gap 01/31/2015 15  5 - 15 Final  . Sodium 01/31/2015 127* 135 - 145 mmol/L Final  . Potassium 01/31/2015 7.6* 3.5 - 5.1 mmol/L Final  . Glucose, Bld 01/31/2015 116* 65 - 99 mg/dL Final  . HCT 01/31/2015 39.0  36.0 - 46.0 % Final  . Hemoglobin 01/31/2015 13.3  12.0 - 15.0 g/dL Final  . Sodium 01/31/2015 127* 135 - 145 mmol/L Final  . Potassium 01/31/2015 7.9* 3.5 - 5.1 mmol/L Final  . Glucose, Bld 01/31/2015 109* 65 - 99 mg/dL Final  . HCT 01/31/2015 38.0  36.0 - 46.0 % Final  . Hemoglobin 01/31/2015 12.9  12.0 - 15.0 g/dL Final  . Glucose-Capillary 01/31/2015 111* 65 - 99 mg/dL Final  . Sodium 01/31/2015 131* 135 - 145 mmol/L Final  . Potassium 01/31/2015 >7.5* 3.5 - 5.1 mmol/L Final   Comment: REPEATED TO VERIFY CRITICAL RESULT CALLED TO, READ BACK BY AND VERIFIED WITH: J.BOND,RN 01/31/15 1324 BY BSLADE   . Chloride 01/31/2015 89* 101 - 111 mmol/L Final  . CO2 01/31/2015 26  22 - 32 mmol/L Final  . Glucose, Bld 01/31/2015 111* 65 - 99 mg/dL Final  . BUN 01/31/2015 96* 6 - 20 mg/dL Final  . Creatinine, Ser 01/31/2015 7.20* 0.44 - 1.00 mg/dL Final  . Calcium 01/31/2015 9.2  8.9 - 10.3 mg/dL Final  . GFR calc non Af Amer 01/31/2015 5* >60 mL/min Final  . GFR calc Af Amer 01/31/2015 5* >60 mL/min Final   Comment: (NOTE) The eGFR has been calculated using the CKD EPI equation. This calculation has not been validated in all clinical situations. eGFR's persistently <60 mL/min signify possible Chronic Kidney Disease.   . Anion gap 01/31/2015 16* 5 - 15 Final  .  Sodium  01/31/2015 134* 135 - 145 mmol/L Final  . Potassium 01/31/2015 4.5  3.5 - 5.1 mmol/L Final  . Chloride 01/31/2015 95* 101 - 111 mmol/L Final  . BUN 01/31/2015 44* 6 - 20 mg/dL Final  . Creatinine, Ser 01/31/2015 4.00* 0.44 - 1.00 mg/dL Final  . Glucose, Bld 01/31/2015 101* 65 - 99 mg/dL Final  . Calcium, Ion 01/31/2015 1.16  1.13 - 1.30 mmol/L Final  . TCO2 01/31/2015 25  0 - 100 mmol/L Final  . Hemoglobin 01/31/2015 12.9  12.0 - 15.0 g/dL Final  . HCT 01/31/2015 38.0  36.0 - 46.0 % Final  . Sodium 01/31/2015 135  135 - 145 mmol/L Final  . Potassium 01/31/2015 3.7  3.5 - 5.1 mmol/L Final  . Chloride 01/31/2015 95* 101 - 111 mmol/L Final  . BUN 01/31/2015 26* 6 - 20 mg/dL Final  . Creatinine, Ser 01/31/2015 2.40* 0.44 - 1.00 mg/dL Final  . Glucose, Bld 01/31/2015 97  65 - 99 mg/dL Final  . Calcium, Ion 01/31/2015 1.13  1.13 - 1.30 mmol/L Final  . TCO2 01/31/2015 26  0 - 100 mmol/L Final  . Hemoglobin 01/31/2015 12.6  12.0 - 15.0 g/dL Final  . HCT 01/31/2015 37.0  36.0 - 46.0 % Final  Admission on 12/21/2014, Discharged on 12/24/2014  Component Date Value Ref Range Status  . WBC 12/21/2014 7.9  4.0 - 10.5 K/uL Final  . RBC 12/21/2014 3.31* 3.87 - 5.11 MIL/uL Final  . Hemoglobin 12/21/2014 10.5* 12.0 - 15.0 g/dL Final  . HCT 12/21/2014 31.6* 36.0 - 46.0 % Final  . MCV 12/21/2014 95.5  78.0 - 100.0 fL Final  . MCH 12/21/2014 31.7  26.0 - 34.0 pg Final  . MCHC 12/21/2014 33.2  30.0 - 36.0 g/dL Final  . RDW 12/21/2014 18.0* 11.5 - 15.5 % Final  . Platelets 12/21/2014 110* 150 - 400 K/uL Final   Comment: SPECIMEN CHECKED FOR CLOTS REPEATED TO VERIFY PLATELET COUNT CONFIRMED BY SMEAR   . Neutrophils Relative % 12/21/2014 68  43 - 77 % Final  . Neutro Abs 12/21/2014 5.4  1.7 - 7.7 K/uL Final  . Lymphocytes Relative 12/21/2014 8* 12 - 46 % Final  . Lymphs Abs 12/21/2014 0.6* 0.7 - 4.0 K/uL Final  . Monocytes Relative 12/21/2014 14* 3 - 12 % Final  . Monocytes Absolute 12/21/2014 1.1*  0.1 - 1.0 K/uL Final  . Eosinophils Relative 12/21/2014 9* 0 - 5 % Final  . Eosinophils Absolute 12/21/2014 0.7  0.0 - 0.7 K/uL Final  . Basophils Relative 12/21/2014 1  0 - 1 % Final  . Basophils Absolute 12/21/2014 0.0  0.0 - 0.1 K/uL Final  . Sodium 12/21/2014 131* 135 - 145 mmol/L Final  . Potassium 12/21/2014 4.5  3.5 - 5.1 mmol/L Final  . Chloride 12/21/2014 92* 101 - 111 mmol/L Final  . CO2 12/21/2014 28  22 - 32 mmol/L Final  . Glucose, Bld 12/21/2014 121* 65 - 99 mg/dL Final  . BUN 12/21/2014 39* 6 - 20 mg/dL Final  . Creatinine, Ser 12/21/2014 3.08* 0.44 - 1.00 mg/dL Final  . Calcium 12/21/2014 8.6* 8.9 - 10.3 mg/dL Final  . GFR calc non Af Amer 12/21/2014 13* >60 mL/min Final  . GFR calc Af Amer 12/21/2014 15* >60 mL/min Final   Comment: (NOTE) The eGFR has been calculated using the CKD EPI equation. This calculation has not been validated in all clinical situations. eGFR's persistently <60 mL/min signify possible Chronic Kidney Disease.   Marland Kitchen  Anion gap 12/21/2014 11  5 - 15 Final  . Troponin I 12/21/2014 0.07* <0.031 ng/mL Final   Comment:        PERSISTENTLY INCREASED TROPONIN VALUES IN THE RANGE OF 0.04-0.49 ng/mL CAN BE SEEN IN:       -UNSTABLE ANGINA       -CONGESTIVE HEART FAILURE       -MYOCARDITIS       -CHEST TRAUMA       -ARRYHTHMIAS       -LATE PRESENTING MYOCARDIAL INFARCTION       -COPD   CLINICAL FOLLOW-UP RECOMMENDED.   Marland Kitchen Sodium 12/22/2014 133* 135 - 145 mmol/L Final  . Potassium 12/22/2014 5.3* 3.5 - 5.1 mmol/L Final  . Chloride 12/22/2014 93* 101 - 111 mmol/L Final  . CO2 12/22/2014 26  22 - 32 mmol/L Final  . Glucose, Bld 12/22/2014 88  65 - 99 mg/dL Final  . BUN 12/22/2014 48* 6 - 20 mg/dL Final  . Creatinine, Ser 12/22/2014 4.03* 0.44 - 1.00 mg/dL Final  . Calcium 12/22/2014 9.0  8.9 - 10.3 mg/dL Final  . Total Protein 12/22/2014 6.4* 6.5 - 8.1 g/dL Final  . Albumin 12/22/2014 3.0* 3.5 - 5.0 g/dL Final  . AST 12/22/2014 34  15 - 41 U/L Final    . ALT 12/22/2014 41  14 - 54 U/L Final  . Alkaline Phosphatase 12/22/2014 135* 38 - 126 U/L Final  . Total Bilirubin 12/22/2014 0.8  0.3 - 1.2 mg/dL Final  . GFR calc non Af Amer 12/22/2014 9* >60 mL/min Final  . GFR calc Af Amer 12/22/2014 11* >60 mL/min Final   Comment: (NOTE) The eGFR has been calculated using the CKD EPI equation. This calculation has not been validated in all clinical situations. eGFR's persistently <60 mL/min signify possible Chronic Kidney Disease.   . Anion gap 12/22/2014 14  5 - 15 Final  . WBC 12/22/2014 6.2  4.0 - 10.5 K/uL Final  . RBC 12/22/2014 3.21* 3.87 - 5.11 MIL/uL Final  . Hemoglobin 12/22/2014 10.2* 12.0 - 15.0 g/dL Final  . HCT 12/22/2014 31.1* 36.0 - 46.0 % Final  . MCV 12/22/2014 96.9  78.0 - 100.0 fL Final  . MCH 12/22/2014 31.8  26.0 - 34.0 pg Final  . MCHC 12/22/2014 32.8  30.0 - 36.0 g/dL Final  . RDW 12/22/2014 18.2* 11.5 - 15.5 % Final  . Platelets 12/22/2014 116* 150 - 400 K/uL Final   CONSISTENT WITH PREVIOUS RESULT  . Glucose-Capillary 12/21/2014 119* 65 - 99 mg/dL Final  . Troponin I 12/22/2014 0.09* <0.031 ng/mL Final   Comment:        PERSISTENTLY INCREASED TROPONIN VALUES IN THE RANGE OF 0.04-0.49 ng/mL CAN BE SEEN IN:       -UNSTABLE ANGINA       -CONGESTIVE HEART FAILURE       -MYOCARDITIS       -CHEST TRAUMA       -ARRYHTHMIAS       -LATE PRESENTING MYOCARDIAL INFARCTION       -COPD   CLINICAL FOLLOW-UP RECOMMENDED.   Marland Kitchen MRSA by PCR 12/22/2014 NEGATIVE  NEGATIVE Final   Comment:        The GeneXpert MRSA Assay (FDA approved for NASAL specimens only), is one component of a comprehensive MRSA colonization surveillance program. It is not intended to diagnose MRSA infection nor to guide or monitor treatment for MRSA infections.   . WBC 12/23/2014 5.1  4.0 - 10.5 K/uL Final  .  RBC 12/23/2014 3.33* 3.87 - 5.11 MIL/uL Final  . Hemoglobin 12/23/2014 10.3* 12.0 - 15.0 g/dL Final  . HCT 12/23/2014 31.7* 36.0 - 46.0 %  Final  . MCV 12/23/2014 95.2  78.0 - 100.0 fL Final  . MCH 12/23/2014 30.9  26.0 - 34.0 pg Final  . MCHC 12/23/2014 32.5  30.0 - 36.0 g/dL Final  . RDW 12/23/2014 18.3* 11.5 - 15.5 % Final  . Platelets 12/23/2014 146* 150 - 400 K/uL Final  . Sodium 12/23/2014 127* 135 - 145 mmol/L Final  . Potassium 12/23/2014 5.6* 3.5 - 5.1 mmol/L Final  . Chloride 12/23/2014 88* 101 - 111 mmol/L Final  . CO2 12/23/2014 23  22 - 32 mmol/L Final  . Glucose, Bld 12/23/2014 90  65 - 99 mg/dL Final  . BUN 12/23/2014 65* 6 - 20 mg/dL Final  . Creatinine, Ser 12/23/2014 5.57* 0.44 - 1.00 mg/dL Final  . Calcium 12/23/2014 9.2  8.9 - 10.3 mg/dL Final  . Phosphorus 12/23/2014 5.0* 2.5 - 4.6 mg/dL Final  . Albumin 12/23/2014 2.9* 3.5 - 5.0 g/dL Final  . GFR calc non Af Amer 12/23/2014 6* >60 mL/min Final  . GFR calc Af Amer 12/23/2014 7* >60 mL/min Final   Comment: (NOTE) The eGFR has been calculated using the CKD EPI equation. This calculation has not been validated in all clinical situations. eGFR's persistently <60 mL/min signify possible Chronic Kidney Disease.   . Anion gap 12/23/2014 16* 5 - 15 Final  . Glucose-Capillary 12/22/2014 97  65 - 99 mg/dL Final  . Glucose-Capillary 12/22/2014 97  65 - 99 mg/dL Final  . Glucose-Capillary 12/23/2014 98  65 - 99 mg/dL Final  . Glucose-Capillary 12/23/2014 73  65 - 99 mg/dL Final  . Glucose-Capillary 12/23/2014 102* 65 - 99 mg/dL Final  . WBC 12/23/2014 6.7  4.0 - 10.5 K/uL Final  . RBC 12/23/2014 3.36* 3.87 - 5.11 MIL/uL Final  . Hemoglobin 12/23/2014 10.3* 12.0 - 15.0 g/dL Final  . HCT 12/23/2014 32.3* 36.0 - 46.0 % Final  . MCV 12/23/2014 96.1  78.0 - 100.0 fL Final  . MCH 12/23/2014 30.7  26.0 - 34.0 pg Final  . MCHC 12/23/2014 31.9  30.0 - 36.0 g/dL Final  . RDW 12/23/2014 18.4* 11.5 - 15.5 % Final  . Platelets 12/23/2014 120* 150 - 400 K/uL Final  . Glucose-Capillary 12/23/2014 125* 65 - 99 mg/dL Final  . Sodium 12/24/2014 130* 135 - 145 mmol/L Final   . Potassium 12/24/2014 4.6  3.5 - 5.1 mmol/L Final  . Chloride 12/24/2014 93* 101 - 111 mmol/L Final  . CO2 12/24/2014 26  22 - 32 mmol/L Final  . Glucose, Bld 12/24/2014 101* 65 - 99 mg/dL Final  . BUN 12/24/2014 34* 6 - 20 mg/dL Final  . Creatinine, Ser 12/24/2014 4.00* 0.44 - 1.00 mg/dL Final  . Calcium 12/24/2014 8.9  8.9 - 10.3 mg/dL Final  . Phosphorus 12/24/2014 4.1  2.5 - 4.6 mg/dL Final  . Albumin 12/24/2014 2.8* 3.5 - 5.0 g/dL Final  . GFR calc non Af Amer 12/24/2014 10* >60 mL/min Final  . GFR calc Af Amer 12/24/2014 11* >60 mL/min Final   Comment: (NOTE) The eGFR has been calculated using the CKD EPI equation. This calculation has not been validated in all clinical situations. eGFR's persistently <60 mL/min signify possible Chronic Kidney Disease.   . Anion gap 12/24/2014 11  5 - 15 Final  . WBC 12/24/2014 6.1  4.0 - 10.5 K/uL Final  .  RBC 12/24/2014 3.39* 3.87 - 5.11 MIL/uL Final  . Hemoglobin 12/24/2014 10.4* 12.0 - 15.0 g/dL Final  . HCT 12/24/2014 32.8* 36.0 - 46.0 % Final  . MCV 12/24/2014 96.8  78.0 - 100.0 fL Final  . MCH 12/24/2014 30.7  26.0 - 34.0 pg Final  . MCHC 12/24/2014 31.7  30.0 - 36.0 g/dL Final  . RDW 12/24/2014 18.4* 11.5 - 15.5 % Final  . Platelets 12/24/2014 130* 150 - 400 K/uL Final  . Glucose-Capillary 12/24/2014 101* 65 - 99 mg/dL Final  . Glucose-Capillary 12/24/2014 175* 65 - 99 mg/dL Final     Assessment/Plan 1. Protein-calorie malnutrition, severe Mirtazipine did not help improve appetite. Also did not help depression. It is discontinued. We spent 15 minutes exploring ways to prepare food, apparently now shakes, and stock foods that she enjoys.  2. Anorexia Possibly part of her depression or ESRD with dialysis  3. ESRD (end stage renal disease) Tolerating dialysis although she feels weak  4. Anemia of chronic disease There are chronic components to this as well as her ESRD. Last hemoglobin was slightly improved at 11.5 g percent  area this is a normocytic normochromic anemia with a slightly high RDW. Platelets which previously were low returned to normal at 165,000  5. Essential hypertension Controlled  6. DM type 2, uncontrolled, with renal complications Satisfactory control at this time. Last capillary glucose done 01/31/2015 was 111  7. Hyperlipemia Satisfactory control in January 2016  8. Congestive dilated cardiomyopathy-EF 40-45% at cath, 35% by echo Resume:  carvedilol (COREG) 12.5 MG tablet; One twice daily to strengthen the heart and control BP  Dispense: 60 tablet; Refill: 5  9. Depression Discontinue mirtazapine due to failure to improve her depression. - New prescription for:  sertraline (ZOLOFT) 50 MG tablet; Take 1 tablet (50 mg total) by mouth daily.  Dispense: 30 tablet; Refill: 3  10. Memory loss MMSE at next Office visit

## 2015-02-05 NOTE — Patient Instructions (Addendum)
Use current list of medications. Stop mirtazipine. Start sertraline. Resume Coreg. Come to lab prior to next visit.

## 2015-02-10 ENCOUNTER — Encounter: Payer: Self-pay | Admitting: Internal Medicine

## 2015-02-10 DIAGNOSIS — R413 Other amnesia: Secondary | ICD-10-CM

## 2015-02-10 HISTORY — DX: Other amnesia: R41.3

## 2015-02-12 ENCOUNTER — Other Ambulatory Visit: Payer: Self-pay

## 2015-02-12 DIAGNOSIS — F329 Major depressive disorder, single episode, unspecified: Secondary | ICD-10-CM

## 2015-02-12 DIAGNOSIS — F32A Depression, unspecified: Secondary | ICD-10-CM

## 2015-02-12 DIAGNOSIS — I420 Dilated cardiomyopathy: Secondary | ICD-10-CM

## 2015-02-12 MED ORDER — SERTRALINE HCL 50 MG PO TABS: 50.0000 mg | ORAL_TABLET | Freq: Every day | ORAL | Status: DC

## 2015-02-12 MED ORDER — CARVEDILOL 12.5 MG PO TABS: ORAL_TABLET | ORAL | Status: DC

## 2015-02-12 NOTE — Telephone Encounter (Signed)
Patient's niece called indicating patient never received rx's sent in on 01/2915. Patient said she has no idea what happened to her rx's or where we sent them to.  If rx's not already mailed patient's niece would like rx's sent to local pharmacy   I called mail order company to see if rx already sent out. Patient spoke with rep from [facility] and told them to hold RX's and she would have her cousin call when she needed medication.  I called patient's niece back and informed her that patient told pharmacy to hold rx's, rx's sent to local pharmacy. Patient's niece would like for patient to have her memory checked at next OV.

## 2015-02-21 ENCOUNTER — Other Ambulatory Visit: Payer: Self-pay

## 2015-02-27 ENCOUNTER — Telehealth: Payer: Self-pay

## 2015-02-27 NOTE — Telephone Encounter (Signed)
A walk-in triage form was completed and submitted indicating patient is still refusing to eat food. Yesterday patient was found unresponsive at 10:30 pm, patient was unable to speak. EMS was called, patient was started o n IV fluids and the EMS workers tried to encourage patient to eat a peanut butter cracker. Patient refused and refused to go to the hospital. Patient's family is concerned that patient is not capable of living on her own and needs frequent monitoring.    Left message on voicemail for patient's family member to return call when available. I proceeded and scheduled appointment with Dr.Green for next Tuesday to assure slot due to limit availability. I will confirm this appointment time is suitable for patient and family when my call is returned.      Please advise

## 2015-02-28 NOTE — Telephone Encounter (Signed)
02/28/2015 4:45 PM called patient and discussed her issues with her. She is just not been hungry. She says that she has not given out. She denies depression. She continues to go to dialysis. She is cooked a pot of lima beans and hand today and feels that she can eat some of that. She is in agreement with coming to the office next Tuesday.

## 2015-03-03 NOTE — Telephone Encounter (Signed)
Patient rescheduled to to Wednesday

## 2015-03-04 ENCOUNTER — Encounter: Payer: Self-pay | Admitting: Internal Medicine

## 2015-03-05 ENCOUNTER — Ambulatory Visit (INDEPENDENT_AMBULATORY_CARE_PROVIDER_SITE_OTHER): Payer: Medicare Other | Admitting: Internal Medicine

## 2015-03-05 ENCOUNTER — Encounter: Payer: Self-pay | Admitting: Internal Medicine

## 2015-03-05 VITALS — BP 148/62 | HR 64 | Temp 98.2°F | Ht 65.0 in | Wt 102.4 lb

## 2015-03-05 DIAGNOSIS — E1129 Type 2 diabetes mellitus with other diabetic kidney complication: Secondary | ICD-10-CM | POA: Diagnosis not present

## 2015-03-05 DIAGNOSIS — D638 Anemia in other chronic diseases classified elsewhere: Secondary | ICD-10-CM | POA: Diagnosis not present

## 2015-03-05 DIAGNOSIS — R55 Syncope and collapse: Secondary | ICD-10-CM | POA: Diagnosis not present

## 2015-03-05 DIAGNOSIS — E1165 Type 2 diabetes mellitus with hyperglycemia: Secondary | ICD-10-CM

## 2015-03-05 DIAGNOSIS — IMO0002 Reserved for concepts with insufficient information to code with codable children: Secondary | ICD-10-CM

## 2015-03-05 DIAGNOSIS — I10 Essential (primary) hypertension: Secondary | ICD-10-CM | POA: Diagnosis not present

## 2015-03-05 DIAGNOSIS — R627 Adult failure to thrive: Secondary | ICD-10-CM

## 2015-03-05 DIAGNOSIS — R630 Anorexia: Secondary | ICD-10-CM | POA: Diagnosis not present

## 2015-03-05 DIAGNOSIS — N186 End stage renal disease: Secondary | ICD-10-CM | POA: Diagnosis not present

## 2015-03-05 DIAGNOSIS — F32A Depression, unspecified: Secondary | ICD-10-CM

## 2015-03-05 DIAGNOSIS — F329 Major depressive disorder, single episode, unspecified: Secondary | ICD-10-CM

## 2015-03-05 DIAGNOSIS — R413 Other amnesia: Secondary | ICD-10-CM | POA: Diagnosis not present

## 2015-03-05 NOTE — Progress Notes (Signed)
Patient ID: Natalie Wolfe, female   DOB: 1929/12/28, 81 y.o.   MRN: [id number]    Facility  [facility]    Place of Service:   OFFICE    Allergies  Allergen Reactions  . Heparin Other (See Comments)    Positive Hep Induced Plt Ab and SRA 09/12/2014    Chief Complaint  Patient presents with  . Acute Visit    Complains of Syncope episode, evaluated by EMS but refused to go to the hospital. Complains of falling alot, states her blood sugar drops    HPI:  Syncope, unspecified syncope type: Patient reported a syncopal episode a few days ago. She did not want to go to the emergency room and she refused transport by EMS when they arrived to evaluate her. Etiology of the syncope is uncertain. She came back to full alertness quickly. She has not been eating well. She continues to lose weight.  DM type 2, uncontrolled, with renal complications: Patient says her blood sugars have been running lower. She is not certain whether she could've had a hypoglycemia at the time she blacked out.  Anorexia: Continues to report no appetite.  Depression: Continues to have episodes where she is discouraged. Denies any problems of the magnitude that would cause her to do harm to herself.  ESRD (end stage renal disease): Continues on dialysis Tuesday Thursday and Saturday  Essential hypertension: Blood pressures continue to show mild systolic elevation several blood pressure medications and continues on dialysis. She has been losing weight. Denies headache, chest pain, or palpitations.  Memory loss: Patient is aware of difficulty with her short-term memory.  Anemia of chronic disease: Latest lab in June 2016 showed hemoglobin 11.5 g percent.  Failure to thrive: Patient has weight loss, poor appetite, her memory is failing and she is falling. She has family that would like her to move to their house, but she says she really is not removed for her there are grown children living there and, although she limps in  that household when she arrived in [place], she does not want to go back.   Medications: Patient's Medications  New Prescriptions   No medications on file  Previous Medications   ACETAMINOPHEN (TYLENOL) 500 MG TABLET    Take 500 mg by mouth as needed.   CARVEDILOL (COREG) 12.5 MG TABLET    One twice daily to strengthen the heart and control BP   ENSURE (ENSURE)    Take 237 mLs by mouth 3 (three) times daily between meals.   FLUOCINONIDE CREAM (LIDEX) 0.05 %    Apply 1 application topically 2 (two) times daily. Apply from neck down twice a day (provided by dermatology).   GLIMEPIRIDE (AMARYL) 2 MG TABLET    Take 1 mg by mouth daily. Take 1/2 tablet by mouth once daily to control blood sugar   GLUCOSE BLOOD (BAYER CONTOUR NEXT TEST) TEST STRIP    Use as instructed   HYDRALAZINE (APRESOLINE) 25 MG TABLET    Take 1 tablet (25 mg total) by mouth every 8 (eight) hours.   LIDOCAINE-PRILOCAINE (EMLA) CREAM    Apply 1 application topically as needed.   SERTRALINE (ZOLOFT) 50 MG TABLET    Take 1 tablet (50 mg total) by mouth daily.  Modified Medications   No medications on file  Discontinued Medications   No medications on file     Review of Systems  Constitutional: Positive for activity change, appetite change (anorectic), fatigue and unexpected weight change (continues to lose weight. Carlis Abbott  deficiency with poor intake.). Negative for fever and chills.       This lost a lot of weight since I last saw the patient. This is related to anorexia and extended period of time in the hospital prior to this visit.  HENT: Negative for congestion and mouth sores.        Dentures present  Eyes:       Corrective lenses  Respiratory: Negative for cough, chest tightness and shortness of breath.   Cardiovascular: Positive for leg swelling. Negative for chest pain.       Cardiopulmonary arrest in February 2016  Gastrointestinal: Negative for abdominal pain and constipation.  Endocrine:       Diabetic  with renal disease.  Genitourinary: Positive for urgency and frequency. Negative for dysuria and flank pain.  Skin:       Chronic eczema causes itching  Neurological: Positive for syncope. Negative for dizziness and light-headedness.       Increasing memory problems since January 2016.  Hematological: Negative for adenopathy.       History of chronic anemia  Psychiatric/Behavioral: Positive for confusion, sleep disturbance, dysphoric mood and decreased concentration. Negative for behavioral problems and agitation. The patient is nervous/anxious.     Filed Vitals:   03/05/15 1543  BP: 148/62  Pulse: 64  Temp: 98.2 F (36.8 C)  TempSrc: Oral  Height: 5' 5"  (1.651 m)  Weight: 102 lb 6.4 oz (46.448 kg)   Body mass index is 17.04 kg/(m^2).  Physical Exam  Constitutional: She is oriented to person, place, and time. No distress.  Frail, elderly female  HENT:  Head: Normocephalic and atraumatic.  Mouth/Throat: Oropharynx is clear and moist. No oropharyngeal exudate.  Upper and lower dentures  Eyes: Conjunctivae and EOM are normal. Pupils are equal, round, and reactive to light.  Neck: Normal range of motion. Neck supple. No JVD present. No tracheal deviation present. No thyromegaly present.  Cardiovascular: Normal rate, regular rhythm and normal heart sounds.   No murmur heard. Pulmonary/Chest: Effort normal and breath sounds normal. No respiratory distress. She has no wheezes. She has no rales.  Abdominal: Soft. Bowel sounds are normal. She exhibits no distension and no mass. There is no tenderness.  Musculoskeletal: Normal range of motion. She exhibits edema (1+ both feet). She exhibits no tenderness.  Lymphadenopathy:    She has no cervical adenopathy.  Neurological: She is alert and oriented to person, place, and time. No cranial nerve deficit. Coordination normal.  Diminished sensation to vibration and monofilament testing bilaterally.  Skin: Skin is warm and dry. No rash noted.  She is not diaphoretic.  Chronic eczematous changes. Thinning of skin.  Psychiatric: She has a normal mood and affect. Her behavior is normal.     Labs reviewed: Admission on 01/31/2015, Discharged on 01/31/2015  Component Date Value Ref Range Status  . WBC 01/31/2015 6.0  4.0 - 10.5 K/uL Final  . RBC 01/31/2015 3.59* 3.87 - 5.11 MIL/uL Final  . Hemoglobin 01/31/2015 11.5* 12.0 - 15.0 g/dL Final  . HCT 01/31/2015 34.9* 36.0 - 46.0 % Final  . MCV 01/31/2015 97.2  78.0 - 100.0 fL Final  . MCH 01/31/2015 32.0  26.0 - 34.0 pg Final  . MCHC 01/31/2015 33.0  30.0 - 36.0 g/dL Final  . RDW 01/31/2015 16.1* 11.5 - 15.5 % Final  . Platelets 01/31/2015 165  150 - 400 K/uL Final  . Glucose-Capillary 01/31/2015 118* 65 - 99 mg/dL Final  . Sodium 01/31/2015 129* 135 -  145 mmol/L Final  . Potassium 01/31/2015 >7.5* 3.5 - 5.1 mmol/L Final   Comment: NO VISIBLE HEMOLYSIS REPEATED TO VERIFY CRITICAL RESULT CALLED TO, READ BACK BY AND VERIFIED WITH: K.KENSMOE,RN 1130 01/31/15 CLARK,S   . Chloride 01/31/2015 87* 101 - 111 mmol/L Final  . CO2 01/31/2015 27  22 - 32 mmol/L Final  . Glucose, Bld 01/31/2015 109* 65 - 99 mg/dL Final  . BUN 01/31/2015 97* 6 - 20 mg/dL Final  . Creatinine, Ser 01/31/2015 7.18* 0.44 - 1.00 mg/dL Final  . Calcium 01/31/2015 9.3  8.9 - 10.3 mg/dL Final  . GFR calc non Af Amer 01/31/2015 5* >60 mL/min Final  . GFR calc Af Amer 01/31/2015 5* >60 mL/min Final   Comment: (NOTE) The eGFR has been calculated using the CKD EPI equation. This calculation has not been validated in all clinical situations. eGFR's persistently <60 mL/min signify possible Chronic Kidney Disease.   . Anion gap 01/31/2015 15  5 - 15 Final  . Sodium 01/31/2015 127* 135 - 145 mmol/L Final  . Potassium 01/31/2015 7.6* 3.5 - 5.1 mmol/L Final  . Glucose, Bld 01/31/2015 116* 65 - 99 mg/dL Final  . HCT 01/31/2015 39.0  36.0 - 46.0 % Final  . Hemoglobin 01/31/2015 13.3  12.0 - 15.0 g/dL Final  . Sodium  01/31/2015 127* 135 - 145 mmol/L Final  . Potassium 01/31/2015 7.9* 3.5 - 5.1 mmol/L Final  . Glucose, Bld 01/31/2015 109* 65 - 99 mg/dL Final  . HCT 01/31/2015 38.0  36.0 - 46.0 % Final  . Hemoglobin 01/31/2015 12.9  12.0 - 15.0 g/dL Final  . Glucose-Capillary 01/31/2015 111* 65 - 99 mg/dL Final  . Sodium 01/31/2015 131* 135 - 145 mmol/L Final  . Potassium 01/31/2015 >7.5* 3.5 - 5.1 mmol/L Final   Comment: REPEATED TO VERIFY CRITICAL RESULT CALLED TO, READ BACK BY AND VERIFIED WITH: J.BOND,RN 01/31/15 1324 BY BSLADE   . Chloride 01/31/2015 89* 101 - 111 mmol/L Final  . CO2 01/31/2015 26  22 - 32 mmol/L Final  . Glucose, Bld 01/31/2015 111* 65 - 99 mg/dL Final  . BUN 01/31/2015 96* 6 - 20 mg/dL Final  . Creatinine, Ser 01/31/2015 7.20* 0.44 - 1.00 mg/dL Final  . Calcium 01/31/2015 9.2  8.9 - 10.3 mg/dL Final  . GFR calc non Af Amer 01/31/2015 5* >60 mL/min Final  . GFR calc Af Amer 01/31/2015 5* >60 mL/min Final   Comment: (NOTE) The eGFR has been calculated using the CKD EPI equation. This calculation has not been validated in all clinical situations. eGFR's persistently <60 mL/min signify possible Chronic Kidney Disease.   . Anion gap 01/31/2015 16* 5 - 15 Final  . Sodium 01/31/2015 134* 135 - 145 mmol/L Final  . Potassium 01/31/2015 4.5  3.5 - 5.1 mmol/L Final  . Chloride 01/31/2015 95* 101 - 111 mmol/L Final  . BUN 01/31/2015 44* 6 - 20 mg/dL Final  . Creatinine, Ser 01/31/2015 4.00* 0.44 - 1.00 mg/dL Final  . Glucose, Bld 01/31/2015 101* 65 - 99 mg/dL Final  . Calcium, Ion 01/31/2015 1.16  1.13 - 1.30 mmol/L Final  . TCO2 01/31/2015 25  0 - 100 mmol/L Final  . Hemoglobin 01/31/2015 12.9  12.0 - 15.0 g/dL Final  . HCT 01/31/2015 38.0  36.0 - 46.0 % Final  . Sodium 01/31/2015 135  135 - 145 mmol/L Final  . Potassium 01/31/2015 3.7  3.5 - 5.1 mmol/L Final  . Chloride 01/31/2015 95* 101 - 111 mmol/L Final  .  BUN 01/31/2015 26* 6 - 20 mg/dL Final  . Creatinine, Ser 01/31/2015  2.40* 0.44 - 1.00 mg/dL Final  . Glucose, Bld 01/31/2015 97  65 - 99 mg/dL Final  . Calcium, Ion 01/31/2015 1.13  1.13 - 1.30 mmol/L Final  . TCO2 01/31/2015 26  0 - 100 mmol/L Final  . Hemoglobin 01/31/2015 12.6  12.0 - 15.0 g/dL Final  . HCT 01/31/2015 37.0  36.0 - 46.0 % Final  Admission on 12/21/2014, Discharged on 12/24/2014  Component Date Value Ref Range Status  . WBC 12/21/2014 7.9  4.0 - 10.5 K/uL Final  . RBC 12/21/2014 3.31* 3.87 - 5.11 MIL/uL Final  . Hemoglobin 12/21/2014 10.5* 12.0 - 15.0 g/dL Final  . HCT 12/21/2014 31.6* 36.0 - 46.0 % Final  . MCV 12/21/2014 95.5  78.0 - 100.0 fL Final  . MCH 12/21/2014 31.7  26.0 - 34.0 pg Final  . MCHC 12/21/2014 33.2  30.0 - 36.0 g/dL Final  . RDW 12/21/2014 18.0* 11.5 - 15.5 % Final  . Platelets 12/21/2014 110* 150 - 400 K/uL Final   Comment: SPECIMEN CHECKED FOR CLOTS REPEATED TO VERIFY PLATELET COUNT CONFIRMED BY SMEAR   . Neutrophils Relative % 12/21/2014 68  43 - 77 % Final  . Neutro Abs 12/21/2014 5.4  1.7 - 7.7 K/uL Final  . Lymphocytes Relative 12/21/2014 8* 12 - 46 % Final  . Lymphs Abs 12/21/2014 0.6* 0.7 - 4.0 K/uL Final  . Monocytes Relative 12/21/2014 14* 3 - 12 % Final  . Monocytes Absolute 12/21/2014 1.1* 0.1 - 1.0 K/uL Final  . Eosinophils Relative 12/21/2014 9* 0 - 5 % Final  . Eosinophils Absolute 12/21/2014 0.7  0.0 - 0.7 K/uL Final  . Basophils Relative 12/21/2014 1  0 - 1 % Final  . Basophils Absolute 12/21/2014 0.0  0.0 - 0.1 K/uL Final  . Sodium 12/21/2014 131* 135 - 145 mmol/L Final  . Potassium 12/21/2014 4.5  3.5 - 5.1 mmol/L Final  . Chloride 12/21/2014 92* 101 - 111 mmol/L Final  . CO2 12/21/2014 28  22 - 32 mmol/L Final  . Glucose, Bld 12/21/2014 121* 65 - 99 mg/dL Final  . BUN 12/21/2014 39* 6 - 20 mg/dL Final  . Creatinine, Ser 12/21/2014 3.08* 0.44 - 1.00 mg/dL Final  . Calcium 12/21/2014 8.6* 8.9 - 10.3 mg/dL Final  . GFR calc non Af Amer 12/21/2014 13* >60 mL/min Final  . GFR calc Af Amer  12/21/2014 15* >60 mL/min Final   Comment: (NOTE) The eGFR has been calculated using the CKD EPI equation. This calculation has not been validated in all clinical situations. eGFR's persistently <60 mL/min signify possible Chronic Kidney Disease.   . Anion gap 12/21/2014 11  5 - 15 Final  . Troponin I 12/21/2014 0.07* <0.031 ng/mL Final   Comment:        PERSISTENTLY INCREASED TROPONIN VALUES IN THE RANGE OF 0.04-0.49 ng/mL CAN BE SEEN IN:       -UNSTABLE ANGINA       -CONGESTIVE HEART FAILURE       -MYOCARDITIS       -CHEST TRAUMA       -ARRYHTHMIAS       -LATE PRESENTING MYOCARDIAL INFARCTION       -COPD   CLINICAL FOLLOW-UP RECOMMENDED.   Marland Kitchen Sodium 12/22/2014 133* 135 - 145 mmol/L Final  . Potassium 12/22/2014 5.3* 3.5 - 5.1 mmol/L Final  . Chloride 12/22/2014 93* 101 - 111 mmol/L Final  . CO2 12/22/2014 26  22 - 32 mmol/L Final  . Glucose, Bld 12/22/2014 88  65 - 99 mg/dL Final  . BUN 12/22/2014 48* 6 - 20 mg/dL Final  . Creatinine, Ser 12/22/2014 4.03* 0.44 - 1.00 mg/dL Final  . Calcium 12/22/2014 9.0  8.9 - 10.3 mg/dL Final  . Total Protein 12/22/2014 6.4* 6.5 - 8.1 g/dL Final  . Albumin 12/22/2014 3.0* 3.5 - 5.0 g/dL Final  . AST 12/22/2014 34  15 - 41 U/L Final  . ALT 12/22/2014 41  14 - 54 U/L Final  . Alkaline Phosphatase 12/22/2014 135* 38 - 126 U/L Final  . Total Bilirubin 12/22/2014 0.8  0.3 - 1.2 mg/dL Final  . GFR calc non Af Amer 12/22/2014 9* >60 mL/min Final  . GFR calc Af Amer 12/22/2014 11* >60 mL/min Final   Comment: (NOTE) The eGFR has been calculated using the CKD EPI equation. This calculation has not been validated in all clinical situations. eGFR's persistently <60 mL/min signify possible Chronic Kidney Disease.   . Anion gap 12/22/2014 14  5 - 15 Final  . WBC 12/22/2014 6.2  4.0 - 10.5 K/uL Final  . RBC 12/22/2014 3.21* 3.87 - 5.11 MIL/uL Final  . Hemoglobin 12/22/2014 10.2* 12.0 - 15.0 g/dL Final  . HCT 12/22/2014 31.1* 36.0 - 46.0 % Final    . MCV 12/22/2014 96.9  78.0 - 100.0 fL Final  . MCH 12/22/2014 31.8  26.0 - 34.0 pg Final  . MCHC 12/22/2014 32.8  30.0 - 36.0 g/dL Final  . RDW 12/22/2014 18.2* 11.5 - 15.5 % Final  . Platelets 12/22/2014 116* 150 - 400 K/uL Final   CONSISTENT WITH PREVIOUS RESULT  . Glucose-Capillary 12/21/2014 119* 65 - 99 mg/dL Final  . Troponin I 12/22/2014 0.09* <0.031 ng/mL Final   Comment:        PERSISTENTLY INCREASED TROPONIN VALUES IN THE RANGE OF 0.04-0.49 ng/mL CAN BE SEEN IN:       -UNSTABLE ANGINA       -CONGESTIVE HEART FAILURE       -MYOCARDITIS       -CHEST TRAUMA       -ARRYHTHMIAS       -LATE PRESENTING MYOCARDIAL INFARCTION       -COPD   CLINICAL FOLLOW-UP RECOMMENDED.   Marland Kitchen MRSA by PCR 12/22/2014 NEGATIVE  NEGATIVE Final   Comment:        The GeneXpert MRSA Assay (FDA approved for NASAL specimens only), is one component of a comprehensive MRSA colonization surveillance program. It is not intended to diagnose MRSA infection nor to guide or monitor treatment for MRSA infections.   . WBC 12/23/2014 5.1  4.0 - 10.5 K/uL Final  . RBC 12/23/2014 3.33* 3.87 - 5.11 MIL/uL Final  . Hemoglobin 12/23/2014 10.3* 12.0 - 15.0 g/dL Final  . HCT 12/23/2014 31.7* 36.0 - 46.0 % Final  . MCV 12/23/2014 95.2  78.0 - 100.0 fL Final  . MCH 12/23/2014 30.9  26.0 - 34.0 pg Final  . MCHC 12/23/2014 32.5  30.0 - 36.0 g/dL Final  . RDW 12/23/2014 18.3* 11.5 - 15.5 % Final  . Platelets 12/23/2014 146* 150 - 400 K/uL Final  . Sodium 12/23/2014 127* 135 - 145 mmol/L Final  . Potassium 12/23/2014 5.6* 3.5 - 5.1 mmol/L Final  . Chloride 12/23/2014 88* 101 - 111 mmol/L Final  . CO2 12/23/2014 23  22 - 32 mmol/L Final  . Glucose, Bld 12/23/2014 90  65 - 99 mg/dL Final  . BUN 12/23/2014 65*  6 - 20 mg/dL Final  . Creatinine, Ser 12/23/2014 5.57* 0.44 - 1.00 mg/dL Final  . Calcium 12/23/2014 9.2  8.9 - 10.3 mg/dL Final  . Phosphorus 12/23/2014 5.0* 2.5 - 4.6 mg/dL Final  . Albumin 12/23/2014 2.9*  3.5 - 5.0 g/dL Final  . GFR calc non Af Amer 12/23/2014 6* >60 mL/min Final  . GFR calc Af Amer 12/23/2014 7* >60 mL/min Final   Comment: (NOTE) The eGFR has been calculated using the CKD EPI equation. This calculation has not been validated in all clinical situations. eGFR's persistently <60 mL/min signify possible Chronic Kidney Disease.   . Anion gap 12/23/2014 16* 5 - 15 Final  . Glucose-Capillary 12/22/2014 97  65 - 99 mg/dL Final  . Glucose-Capillary 12/22/2014 97  65 - 99 mg/dL Final  . Glucose-Capillary 12/23/2014 98  65 - 99 mg/dL Final  . Glucose-Capillary 12/23/2014 73  65 - 99 mg/dL Final  . Glucose-Capillary 12/23/2014 102* 65 - 99 mg/dL Final  . WBC 12/23/2014 6.7  4.0 - 10.5 K/uL Final  . RBC 12/23/2014 3.36* 3.87 - 5.11 MIL/uL Final  . Hemoglobin 12/23/2014 10.3* 12.0 - 15.0 g/dL Final  . HCT 12/23/2014 32.3* 36.0 - 46.0 % Final  . MCV 12/23/2014 96.1  78.0 - 100.0 fL Final  . MCH 12/23/2014 30.7  26.0 - 34.0 pg Final  . MCHC 12/23/2014 31.9  30.0 - 36.0 g/dL Final  . RDW 12/23/2014 18.4* 11.5 - 15.5 % Final  . Platelets 12/23/2014 120* 150 - 400 K/uL Final  . Glucose-Capillary 12/23/2014 125* 65 - 99 mg/dL Final  . Sodium 12/24/2014 130* 135 - 145 mmol/L Final  . Potassium 12/24/2014 4.6  3.5 - 5.1 mmol/L Final  . Chloride 12/24/2014 93* 101 - 111 mmol/L Final  . CO2 12/24/2014 26  22 - 32 mmol/L Final  . Glucose, Bld 12/24/2014 101* 65 - 99 mg/dL Final  . BUN 12/24/2014 34* 6 - 20 mg/dL Final  . Creatinine, Ser 12/24/2014 4.00* 0.44 - 1.00 mg/dL Final  . Calcium 12/24/2014 8.9  8.9 - 10.3 mg/dL Final  . Phosphorus 12/24/2014 4.1  2.5 - 4.6 mg/dL Final  . Albumin 12/24/2014 2.8* 3.5 - 5.0 g/dL Final  . GFR calc non Af Amer 12/24/2014 10* >60 mL/min Final  . GFR calc Af Amer 12/24/2014 11* >60 mL/min Final   Comment: (NOTE) The eGFR has been calculated using the CKD EPI equation. This calculation has not been validated in all clinical situations. eGFR's  persistently <60 mL/min signify possible Chronic Kidney Disease.   . Anion gap 12/24/2014 11  5 - 15 Final  . WBC 12/24/2014 6.1  4.0 - 10.5 K/uL Final  . RBC 12/24/2014 3.39* 3.87 - 5.11 MIL/uL Final  . Hemoglobin 12/24/2014 10.4* 12.0 - 15.0 g/dL Final  . HCT 12/24/2014 32.8* 36.0 - 46.0 % Final  . MCV 12/24/2014 96.8  78.0 - 100.0 fL Final  . MCH 12/24/2014 30.7  26.0 - 34.0 pg Final  . MCHC 12/24/2014 31.7  30.0 - 36.0 g/dL Final  . RDW 12/24/2014 18.4* 11.5 - 15.5 % Final  . Platelets 12/24/2014 130* 150 - 400 K/uL Final  . Glucose-Capillary 12/24/2014 101* 65 - 99 mg/dL Final  . Glucose-Capillary 12/24/2014 175* 65 - 99 mg/dL Final     Assessment/Plan  1. Syncope, unspecified syncope type Advised patient I thought it would be safer for her to move in with family. She is supposed of this at this time.  2. DM type  2, uncontrolled, with renal complications There is suspicion that there are episodes of hypoglycemia, glimepiride was discontinued.  3. Anorexia -Continue mirtazapine 15 mg at bedtime  4. Depression -Continue sertraline 50 mg daily  5. ESRD (end stage renal disease) -Continue dialysis  6. Essential hypertension -Continue hydralazine and carvedilol. Continue amlodipine.  7. Memory loss -Patient is aware of memory loss. She does not feel this makes it unsafe for her to live alone. She recently burned a pot of beans. She has falls. She is losing weight. I think it would be in her best interest to move in with her family, but she is supposed to this.  8. Anemia of chronic disease Continue to monitor.  9. Failure to thrive in adult Patient has been on dialysis for less than a year weight. Appetite is poor. Cord deficiencies caused her to lose weight. She has been counseled on increasing caloric content of her diet, but finds this difficult. She has local family that was offered to have her move in with them. Patient is supposed to this.

## 2015-03-06 ENCOUNTER — Encounter (HOSPITAL_COMMUNITY): Payer: Self-pay | Admitting: *Deleted

## 2015-03-06 MED ORDER — CEFUROXIME SODIUM 1.5 G IJ SOLR
1.5000 g | INTRAMUSCULAR | Status: AC
  Administered 2015-03-07: 1.5 g via INTRAVENOUS
  Filled 2015-03-06: qty 1.5

## 2015-03-06 MED ORDER — SODIUM CHLORIDE 0.9 % IV SOLN
INTRAVENOUS | Status: AC
  Administered 2015-03-07: 10:00:00 via INTRAVENOUS

## 2015-03-06 NOTE — Progress Notes (Signed)
Ms Kindel has some confusion/forgetfulness. Patient was scheduled for surgery in June and was instructed the night before and the day of that she would needs someone to stay with her for 24 hours after surgery, she said that this was the first she heard of it. ( Surgery was cancelled due to elevated Potassium). Ms Hafen states that she will tell her cousin that he will need to stay with her.  Patient denies chest pain, states that she does not have shortness of breath since she has been taking 'heart pill'. Ms Minichiello reports that she is going to dialysis today.

## 2015-03-07 ENCOUNTER — Encounter (HOSPITAL_COMMUNITY): Payer: Self-pay | Admitting: Anesthesiology

## 2015-03-07 ENCOUNTER — Encounter (HOSPITAL_COMMUNITY)
Admission: RE | Disposition: A | Discharge status: Home / Self Care | Payer: Self-pay | Source: Ambulatory Visit | Attending: Vascular Surgery

## 2015-03-07 ENCOUNTER — Ambulatory Visit (HOSPITAL_COMMUNITY): Payer: Medicare Other | Admitting: Anesthesiology

## 2015-03-07 ENCOUNTER — Ambulatory Visit (HOSPITAL_COMMUNITY)
Admission: RE | Admit: 2015-03-07 | Discharge: 2015-03-07 | Disposition: A | Discharge status: Home / Self Care | Payer: Medicare Other | Source: Ambulatory Visit | Attending: Vascular Surgery | Admitting: Vascular Surgery

## 2015-03-07 DIAGNOSIS — N186 End stage renal disease: Secondary | ICD-10-CM | POA: Diagnosis not present

## 2015-03-07 DIAGNOSIS — Z7982 Long term (current) use of aspirin: Secondary | ICD-10-CM | POA: Diagnosis not present

## 2015-03-07 DIAGNOSIS — M1990 Unspecified osteoarthritis, unspecified site: Secondary | ICD-10-CM | POA: Diagnosis not present

## 2015-03-07 DIAGNOSIS — E785 Hyperlipidemia, unspecified: Secondary | ICD-10-CM | POA: Diagnosis not present

## 2015-03-07 DIAGNOSIS — I129 Hypertensive chronic kidney disease with stage 1 through stage 4 chronic kidney disease, or unspecified chronic kidney disease: Secondary | ICD-10-CM | POA: Diagnosis not present

## 2015-03-07 DIAGNOSIS — T82898A Other specified complication of vascular prosthetic devices, implants and grafts, initial encounter: Secondary | ICD-10-CM | POA: Insufficient documentation

## 2015-03-07 DIAGNOSIS — Y832 Surgical operation with anastomosis, bypass or graft as the cause of abnormal reaction of the patient, or of later complication, without mention of misadventure at the time of the procedure: Secondary | ICD-10-CM | POA: Insufficient documentation

## 2015-03-07 DIAGNOSIS — E119 Type 2 diabetes mellitus without complications: Secondary | ICD-10-CM | POA: Diagnosis not present

## 2015-03-07 DIAGNOSIS — I120 Hypertensive chronic kidney disease with stage 5 chronic kidney disease or end stage renal disease: Secondary | ICD-10-CM | POA: Insufficient documentation

## 2015-03-07 DIAGNOSIS — Z87891 Personal history of nicotine dependence: Secondary | ICD-10-CM | POA: Diagnosis not present

## 2015-03-07 DIAGNOSIS — E1122 Type 2 diabetes mellitus with diabetic chronic kidney disease: Secondary | ICD-10-CM | POA: Diagnosis not present

## 2015-03-07 DIAGNOSIS — Z992 Dependence on renal dialysis: Secondary | ICD-10-CM | POA: Insufficient documentation

## 2015-03-07 HISTORY — PX: FALSE ANEURYSM REPAIR: SHX5152

## 2015-03-07 HISTORY — DX: Unspecified dementia, unspecified severity, without behavioral disturbance, psychotic disturbance, mood disturbance, and anxiety: F03.90

## 2015-03-07 LAB — POCT I-STAT 4, (NA,K, GLUC, HGB,HCT)
Glucose, Bld: 106 mg/dL — ABNORMAL HIGH (ref 65–99)
HEMATOCRIT: 38 % (ref 36.0–46.0)
Hemoglobin: 12.9 g/dL (ref 12.0–15.0)
Potassium: 4.2 mmol/L (ref 3.5–5.1)
Sodium: 130 mmol/L — ABNORMAL LOW (ref 135–145)

## 2015-03-07 LAB — GLUCOSE, CAPILLARY
GLUCOSE-CAPILLARY: 84 mg/dL (ref 65–99)
Glucose-Capillary: 93 mg/dL (ref 65–99)
Glucose-Capillary: 82 mg/dL (ref 65–99)

## 2015-03-07 SURGERY — REPAIR, PSEUDOANEURYSM
Anesthesia: Monitor Anesthesia Care | Site: Arm Upper | Laterality: Left

## 2015-03-07 MED ORDER — MIDAZOLAM HCL 2 MG/2ML IJ SOLN
INTRAMUSCULAR | Status: AC
  Filled 2015-03-07: qty 2

## 2015-03-07 MED ORDER — CARVEDILOL 12.5 MG PO TABS
12.5000 mg | ORAL_TABLET | Freq: Once | ORAL | Status: AC
  Administered 2015-03-07: 12.5 mg via ORAL

## 2015-03-07 MED ORDER — LIDOCAINE HCL (CARDIAC) 20 MG/ML IV SOLN
INTRAVENOUS | Status: DC | PRN
  Administered 2015-03-07: 50 mg via INTRAVENOUS

## 2015-03-07 MED ORDER — 0.9 % SODIUM CHLORIDE (POUR BTL) OPTIME
TOPICAL | Status: DC | PRN
  Administered 2015-03-07: 1000 mL

## 2015-03-07 MED ORDER — LIDOCAINE HCL (CARDIAC) 20 MG/ML IV SOLN
INTRAVENOUS | Status: AC
  Filled 2015-03-07: qty 5

## 2015-03-07 MED ORDER — CHLORHEXIDINE GLUCONATE CLOTH 2 % EX PADS: 6.0000 | MEDICATED_PAD | Freq: Once | CUTANEOUS | Status: DC

## 2015-03-07 MED ORDER — LIDOCAINE-EPINEPHRINE (PF) 1 %-1:200000 IJ SOLN
INTRAMUSCULAR | Status: AC
  Filled 2015-03-07: qty 10

## 2015-03-07 MED ORDER — OXYCODONE HCL 5 MG PO TABS: 5.0000 mg | ORAL_TABLET | Freq: Four times a day (QID) | ORAL | Status: DC | PRN

## 2015-03-07 MED ORDER — CARVEDILOL 12.5 MG PO TABS
ORAL_TABLET | ORAL | Status: AC
  Filled 2015-03-07: qty 1

## 2015-03-07 MED ORDER — ONDANSETRON HCL 4 MG/2ML IJ SOLN
INTRAMUSCULAR | Status: AC
  Filled 2015-03-07: qty 2

## 2015-03-07 MED ORDER — PROPOFOL 10 MG/ML IV BOLUS
INTRAVENOUS | Status: DC | PRN
  Administered 2015-03-07: 50 mg via INTRAVENOUS
  Administered 2015-03-07: 150 mg via INTRAVENOUS

## 2015-03-07 MED ORDER — SODIUM CHLORIDE 0.9 % IR SOLN
Status: DC | PRN
  Administered 2015-03-07: 500 mL

## 2015-03-07 MED ORDER — LIDOCAINE-EPINEPHRINE (PF) 1 %-1:200000 IJ SOLN
INTRAMUSCULAR | Status: DC | PRN
Start: 2015-03-07 — End: 2015-03-07
  Administered 2015-03-07: 7 mL

## 2015-03-07 MED ORDER — PHENYLEPHRINE HCL 10 MG/ML IJ SOLN
INTRAMUSCULAR | Status: DC | PRN
  Administered 2015-03-07: 80 ug via INTRAVENOUS
  Administered 2015-03-07: 40 ug via INTRAVENOUS
  Administered 2015-03-07: 80 ug via INTRAVENOUS

## 2015-03-07 MED ORDER — PROMETHAZINE HCL 25 MG/ML IJ SOLN: 6.2500 mg | INTRAMUSCULAR | Status: DC | PRN

## 2015-03-07 MED ORDER — STERILE WATER FOR INJECTION IJ SOLN
INTRAMUSCULAR | Status: AC
  Filled 2015-03-07: qty 10

## 2015-03-07 MED ORDER — ARGATROBAN 250 MG/2.5ML IV SOLN
0.5000 ug/kg/min | INTRAVENOUS | Status: DC
  Filled 2015-03-07: qty 2.5

## 2015-03-07 MED ORDER — FENTANYL CITRATE (PF) 250 MCG/5ML IJ SOLN
INTRAMUSCULAR | Status: AC
  Filled 2015-03-07: qty 5

## 2015-03-07 MED ORDER — SODIUM CHLORIDE 0.9 % IV SOLN
INTRAVENOUS | Status: DC | PRN
  Administered 2015-03-07 (×2): via INTRAVENOUS

## 2015-03-07 MED ORDER — ARGATROBAN 50 MG/50ML IV SOLN
350.0000 ug/kg | INTRAVENOUS | Status: AC
  Administered 2015-03-07: 3200 ug/min via INTRAVENOUS
  Filled 2015-03-07: qty 50

## 2015-03-07 MED ORDER — MEPERIDINE HCL 25 MG/ML IJ SOLN: 6.2500 mg | INTRAMUSCULAR | Status: DC | PRN

## 2015-03-07 MED ORDER — HYDROMORPHONE HCL 1 MG/ML IJ SOLN: 0.2500 mg | INTRAMUSCULAR | Status: DC | PRN

## 2015-03-07 MED ORDER — THROMBIN 20000 UNITS EX SOLR
CUTANEOUS | Status: AC
  Filled 2015-03-07: qty 20000

## 2015-03-07 MED ORDER — PROPOFOL 10 MG/ML IV BOLUS
INTRAVENOUS | Status: AC
  Filled 2015-03-07: qty 20

## 2015-03-07 MED ORDER — ONDANSETRON HCL 4 MG/2ML IJ SOLN
INTRAMUSCULAR | Status: DC | PRN
  Administered 2015-03-07: 4 mg via INTRAVENOUS

## 2015-03-07 MED ORDER — FENTANYL CITRATE (PF) 100 MCG/2ML IJ SOLN
INTRAMUSCULAR | Status: DC | PRN
  Administered 2015-03-07: 50 ug via INTRAVENOUS

## 2015-03-07 MED ORDER — VECURONIUM BROMIDE 10 MG IV SOLR
INTRAVENOUS | Status: AC
  Filled 2015-03-07: qty 10

## 2015-03-07 SURGICAL SUPPLY — 46 items
BANDAGE ELASTIC 4 VELCRO ST LF (GAUZE/BANDAGES/DRESSINGS) IMPLANT
BANDAGE ESMARK 6X9 LF (GAUZE/BANDAGES/DRESSINGS) IMPLANT
BNDG ESMARK 6X9 LF (GAUZE/BANDAGES/DRESSINGS)
CANISTER SUCTION 2500CC (MISCELLANEOUS) ×3 IMPLANT
CANNULA VESSEL 3MM 2 BLNT TIP (CANNULA) IMPLANT
CLIP TI MEDIUM 24 (CLIP) ×3 IMPLANT
CLIP TI WIDE RED SMALL 24 (CLIP) ×3 IMPLANT
CUFF TOURNIQUET SINGLE 18IN (TOURNIQUET CUFF) IMPLANT
CUFF TOURNIQUET SINGLE 24IN (TOURNIQUET CUFF) IMPLANT
CUFF TOURNIQUET SINGLE 34IN LL (TOURNIQUET CUFF) IMPLANT
CUFF TOURNIQUET SINGLE 44IN (TOURNIQUET CUFF) IMPLANT
DRAIN CHANNEL 15F RND FF W/TCR (WOUND CARE) IMPLANT
DRSG COVADERM 4X8 (GAUZE/BANDAGES/DRESSINGS) IMPLANT
ELECT REM PT RETURN 9FT ADLT (ELECTROSURGICAL) ×3
ELECTRODE REM PT RTRN 9FT ADLT (ELECTROSURGICAL) ×1 IMPLANT
EVACUATOR SILICONE 100CC (DRAIN) IMPLANT
GLOVE BIO SURGEON STRL SZ 6.5 (GLOVE) ×4 IMPLANT
GLOVE BIO SURGEON STRL SZ7.5 (GLOVE) ×3 IMPLANT
GLOVE BIO SURGEONS STRL SZ 6.5 (GLOVE) ×2
GLOVE BIOGEL PI IND STRL 6.5 (GLOVE) ×3 IMPLANT
GLOVE BIOGEL PI IND STRL 8 (GLOVE) ×1 IMPLANT
GLOVE BIOGEL PI INDICATOR 6.5 (GLOVE) ×6
GLOVE BIOGEL PI INDICATOR 8 (GLOVE) ×2
GLOVE SURG SS PI 7.0 STRL IVOR (GLOVE) ×3 IMPLANT
GOWN STRL REUS W/ TWL LRG LVL3 (GOWN DISPOSABLE) ×3 IMPLANT
GOWN STRL REUS W/TWL LRG LVL3 (GOWN DISPOSABLE) ×6
KIT BASIN OR (CUSTOM PROCEDURE TRAY) ×3 IMPLANT
KIT ROOM TURNOVER OR (KITS) ×3 IMPLANT
LIQUID BAND (GAUZE/BANDAGES/DRESSINGS) ×6 IMPLANT
NS IRRIG 1000ML POUR BTL (IV SOLUTION) ×3 IMPLANT
PACK PERIPHERAL VASCULAR (CUSTOM PROCEDURE TRAY) ×3 IMPLANT
PAD ARMBOARD 7.5X6 YLW CONV (MISCELLANEOUS) ×6 IMPLANT
PADDING CAST COTTON 6X4 STRL (CAST SUPPLIES) IMPLANT
SPONGE SURGIFOAM ABS GEL 100 (HEMOSTASIS) IMPLANT
STAPLER VISISTAT (STAPLE) IMPLANT
STOCKINETTE TUBULAR 6 INCH (GAUZE/BANDAGES/DRESSINGS) ×3 IMPLANT
SUT PROLENE 5 0 C 1 24 (SUTURE) ×12 IMPLANT
SUT PROLENE 6 0 BV (SUTURE) ×3 IMPLANT
SUT SILK 2 0 FS (SUTURE) IMPLANT
SUT VIC AB 2-0 CTB1 (SUTURE) IMPLANT
SUT VIC AB 3-0 SH 27 (SUTURE) ×2
SUT VIC AB 3-0 SH 27X BRD (SUTURE) ×1 IMPLANT
SUT VICRYL 4-0 PS2 18IN ABS (SUTURE) ×3 IMPLANT
TRAY FOLEY W/METER SILVER 16FR (SET/KITS/TRAYS/PACK) IMPLANT
UNDERPAD 30X30 INCONTINENT (UNDERPADS AND DIAPERS) ×3 IMPLANT
WATER STERILE IRR 1000ML POUR (IV SOLUTION) ×3 IMPLANT

## 2015-03-07 NOTE — Anesthesia Postprocedure Evaluation (Signed)
Anesthesia Post Note  Patient: Natalie Wolfe  Procedure(s) Performed: Procedure(s) (LRB): REPAIR PSEUDOANEURYSMS OF LEFT ARM ARTERIOVENOUS FISTULA (Left)  Anesthesia type: General  Patient location: PACU  Post pain: Pain level controlled  Post assessment: Post-op Vital signs reviewed  Last Vitals: BP 135/54 mmHg  Pulse 63  Temp(Src) 36.7 C (Oral)  Resp 18  Ht  (1.651 m)  Wt 102 lb 6.4 oz (46.448 kg)  BMI 17.04 kg/m2  SpO2 95%  Post vital signs: Reviewed  Level of consciousness: sedated  Complications: No apparent anesthesia complications

## 2015-03-07 NOTE — Discharge Instructions (Signed)
° ° °  03/07/2015 Natalie Wolfe [id number] 11-Feb-1930  Surgeon(s): Chuck Hint, MD  Procedure(s): REPAIR PSEUDOANEURYSMS OF LEFT ARM ARTERIOVENOUS FISTULA  x May stick graft on designated area only:  DO NOT STICK OVER INCISIONS FOR 6 WEEKS

## 2015-03-07 NOTE — Anesthesia Procedure Notes (Signed)
Procedure Name: LMA Insertion Date/Time: 03/07/2015 1:00 PM Performed by: Romie Minus K Pre-anesthesia Checklist: Patient identified, Emergency Drugs available, Suction available, Patient being monitored and Timeout performed Patient Re-evaluated:Patient Re-evaluated prior to inductionOxygen Delivery Method: Circle system utilized Preoxygenation: Pre-oxygenation with 100% oxygen Intubation Type: IV induction Ventilation: Mask ventilation without difficulty LMA: LMA inserted LMA Size: 4.0 Number of attempts: 1 Placement Confirmation: positive ETCO2,  CO2 detector and breath sounds checked- equal and bilateral Tube secured with: Tape Dental Injury: Teeth and Oropharynx as per pre-operative assessment

## 2015-03-07 NOTE — Op Note (Signed)
    NAME: Natalie Wolfe   MRN: [id number] DOB: 1930-05-11    DATE OF OPERATION: 03/07/2015  PREOP DIAGNOSIS: pseudoaneurysms of left brachiocephalic AV fistula  POSTOP DIAGNOSIS: same  PROCEDURE: repair of pseudoaneurysms of left brachiocephalic AV fistula  SURGEON: Di Kindle. Edilia Bo, MD, FACS  ASSIST: Doreatha Massed, PA  ANESTHESIA: Gen.   EBL: minimal  INDICATIONS: Natalie Wolfe is a 81 y.o. female we developed 2 large pseudoaneurysms of her left brachiocephalic AV fistula. She presents for revision of her fistula.  FINDINGS: the pseudoaneurysms were excised and the fistula closed in 3 areas primarily. The fistula can be used in 6 weeks.  TECHNIQUE: The patient was taken to the operating room and received general anesthetic. The left upper extremity was prepped and draped in the usual sterile fashion. A longitudinal incision was made over the larger pseudoaneurysm and the pseudoaneurysm dissected free circumferentially. I controlled the cephalic vein proximal and distal to the pseudoaneurysm. At this level there was one large pseudoaneurysm and then a smaller one on the posterior wall. There was a separate large pseudoaneurysm distal to this one and I elected to address this through a transverse incision. Here too the pseudoaneurysm was dissected free circumferentially. I was able to control the fistula proximal to the pseudoaneurysm. The patient had a heparin allergy and therefore the patient received argatroban. The fistula was clamped proximally and distally. The largest pseudoaneurysm was opened and there was a large pseudoaneurysm filled with organized thrombus which was evacuated once this was evacuated there was a hole in the posterior aspect of the cephalic vein. The artery itself was fragile and therefore I used a vein pledget from a small branch to a horizontal mattress 50 stitch. This controlled the hole in the back wall of the fistula. There was an adjacent smaller  pseudoaneurysm and excise this and repaired this with a horizontal mattress 5-0 suture. The more distal aneurysm was opened and was a large hole in the fistula and this had to be closed primarily with a running suture. At the completion there was a good thrill in the fistula. Each of the wounds was closed with a deeper 3-0 Vicryl and the skin closed with 4-0 Vicryl. A sterile dressing was applied. The patient tolerated the procedure well and transferred to the recovery room in stable condition. All needle and sponge counts were correct.  Waverly Ferrari, MD, FACS [facility]  DATE OF DICTATION:   03/07/2015

## 2015-03-07 NOTE — Transfer of Care (Signed)
Immediate Anesthesia Transfer of Care Note  Patient: Natalie Wolfe  Procedure(s) Performed: Procedure(s): REPAIR PSEUDOANEURYSMS OF LEFT ARM ARTERIOVENOUS FISTULA (Left)  Patient Location: PACU  Anesthesia Type:General  Level of Consciousness: awake and alert   Airway & Oxygen Therapy: Patient Spontanous Breathing and Patient connected to nasal cannula oxygen  Post-op Assessment: Report given to RN and Post -op Vital signs reviewed and stable  Post vital signs: Reviewed and stable  Last Vitals:  Filed Vitals:   03/07/15 1509  BP:   Pulse: 60  Temp:   Resp: 17    Complications: No apparent anesthesia complications

## 2015-03-07 NOTE — Anesthesia Preprocedure Evaluation (Addendum)
Anesthesia Evaluation  Patient identified by MRN, date of birth, ID band Patient awake    Reviewed: Allergy & Precautions, NPO status , Patient's Chart, lab work & pertinent test results, reviewed documented beta blocker date and time   History of Anesthesia Complications (+) PONV and history of anesthetic complications  Airway Mallampati: II   Neck ROM: full    Dental   Pulmonary shortness of breath, former smoker,  Recent respiratory distress due to fluid overload.  Dialysis has improved her function. breath sounds clear to auscultation        Cardiovascular hypertension, Pt. on medications and Pt. on home beta blockers + Peripheral Vascular Disease and +CHF Rhythm:Regular     Neuro/Psych PSYCHIATRIC DISORDERS Anxiety Depression  Neuromuscular disease    GI/Hepatic   Endo/Other  diabetes, Type 2  Renal/GU ESRF and DialysisRenal disease     Musculoskeletal  (+) Arthritis -,   Abdominal   Peds  Hematology  (+) anemia ,   Anesthesia Other Findings Left cheek abrasion. Patient states "I fell at home and hit my cheek."  Reproductive/Obstetrics                            Anesthesia Physical  Anesthesia Plan  ASA: III  Anesthesia Plan: MAC   Post-op Pain Management:    Induction: Intravenous  Airway Management Planned: Simple Face Mask  Additional Equipment:   Intra-op Plan:   Post-operative Plan:   Informed Consent: I have reviewed the patients History and Physical, chart, labs and discussed the procedure including the risks, benefits and alternatives for the proposed anesthesia with the patient or authorized representative who has indicated his/her understanding and acceptance.   Dental advisory given  Plan Discussed with: CRNA  Anesthesia Plan Comments:         Anesthesia Quick Evaluation

## 2015-03-07 NOTE — Progress Notes (Signed)
Pt has pre-exisiting Diatek in right upper chest, both ports capped & clamped . HD access record faxed to [facility] and copy given to pt as well.

## 2015-03-07 NOTE — H&P (Signed)
[facility]  Patient name: Natalie RichardsMRN: [id number] DOB: 06-22-1931Sex: female  REASON FOR VISIT: to evaluate her left brachiocephalic AV fistula. Referred by Dr. Paulene Floor  HPI: Natalie Wolfe is a 81 y.o. female who had a left upper arm AV fistula (brachiocephalic fistula) and placement of a tunneled dialysis catheter placed on 08/31/2014. She has been using this for dialysis. She is confused and there is no family present with her today. She's not even sure what day she dialyzes. She was sent with a fistulogram which I believe was performed on 01/17/2015. This shows that she had some outflow stenosis in her cephalic vein which was addressed with venoplasty. She also had venoplasty of the proximal fistula and it looks like she developed some extravasation and a pseudoaneurysm. We are trying to obtain the dictated report.   She does not have any significant pain associated with her fistula and has no pain or paresthesias in her left upper extremity.  Past Medical History  Diagnosis Date  . Urinary frequency   . Routine general medical examination at a health care facility   . Other atopic dermatitis and related conditions   . Rash and other nonspecific skin eruption   . Other anxiety states   . Insomnia, unspecified   . Chronic kidney disease, stage II (mild)   . Type II or unspecified type diabetes mellitus with renal manifestations, not stated as uncontrolled   . PVD (peripheral vascular disease)   . Unspecified vitamin D deficiency   . Anemia, unspecified   . Obesity, unspecified   . Nocturia   . Pain in joint, lower leg   . Other and unspecified hyperlipidemia   . Unspecified essential hypertension   . Cataract    Family History  Problem Relation Age of Onset  . Diabetes Mother    SOCIAL HISTORY: History  Substance Use Topics  . Smoking  status: Former Smoker    Types: Cigarettes    Quit date: 08/10/2000  . Smokeless tobacco: Not on file  . Alcohol Use: No   Allergies  Allergen Reactions  . Heparin Other (See Comments)    Positive Hep Induced Plt Ab and SRA 09/12/2014   Current Outpatient Prescriptions  Medication Sig Dispense Refill  . acetaminophen (TYLENOL) 325 MG tablet Take 650 mg by mouth daily as needed for mild pain.     Marland Kitchen aspirin EC 81 MG EC tablet Take 1 tablet (81 mg total) by mouth daily. 30 tablet 0  . carvedilol (COREG) 6.25 MG tablet Take 6.25 mg by mouth 2 (two) times daily with a meal.    . fluocinonide cream (LIDEX) 0.05 % Apply 1 application topically 2 (two) times daily. Apply from neck down twice a day (provided by dermatology).    Marland Kitchen glimepiride (AMARYL) 2 MG tablet Take 1 mg by mouth daily. Take 1/2 tablet by mouth once daily to control blood sugar    . glucose blood (BAYER CONTOUR NEXT TEST) test strip Use as instructed 100 each 12  . hydrALAZINE (APRESOLINE) 25 MG tablet Take 1 tablet (25 mg total) by mouth every 8 (eight) hours. 270 tablet 3  . isosorbide mononitrate (IMDUR) 30 MG 24 hr tablet Take 30 mg by mouth daily.    . mirtazapine (REMERON) 15 MG tablet Take One tablet by mouth nightly to help appetite (Patient taking differently: Take 15 mg by mouth at bedtime. Take One tablet by mouth nightly to help appetite) 90 tablet 3  . multivitamin (RENA-VIT)  multivitamin (RENA-VIT) TABS tablet Take 1 tablet by mouth at bedtime. 30 each 0  . calcium acetate (PHOSLO) 667 MG capsule Take 1 capsule (667 mg total) by mouth 3 (three) times daily with meals. (Patient not taking: Reported on 12/21/2014) 90 capsule 0   No current facility-administered medications for this visit.   REVIEW OF SYSTEMS: [X ] denotes positive finding; [  ] denotes negative finding  CARDIOVASCULAR:  [ ] chest pain   [ ] chest pressure   [ ] palpitations   [X ] orthopnea   [ ] dyspnea on exertion   [ ] claudication   [ ] rest pain   [ ] DVT   [ ] phlebitis PULMONARY:   [ ] productive cough   [ ] asthma   [ ] wheezing NEUROLOGIC:   [ ]  weakness  [ ] paresthesias  [ ] aphasia  [ ] amaurosis  [ ] dizziness HEMATOLOGIC:   [ ] bleeding problems   [ ] clotting disorders MUSCULOSKELETAL:  [ ] joint pain   [ ] joint swelling [X ] leg swelling GASTROINTESTINAL: [ ]  blood in stool  [ ]  hematemesis GENITOURINARY:  [ ]  dysuria  [ ]  hematuria PSYCHIATRIC:  [ ] history of major depression INTEGUMENTARY:  [ ] rashes  [ ] ulcers CONSTITUTIONAL:  [ ] fever   [ ] chills  PHYSICAL EXAM: Filed Vitals:   01/29/15 0824  BP: 159/62  Pulse: 88  Height: 5' 5" (1.651 m)  Weight: 103 lb 4.8 oz (46.857 kg)  SpO2: 100%   GENERAL: The patient is a well-nourished female, in no acute distress. The vital signs are documented above. CARDIOVASCULAR: There is a regular rate and rhythm. She has a palpable left radial pulse. She has an excellent thrill in her left upper arm fistula. There is a pseudoaneurysm in the mid upper arm. PULMONARY: There is good air exchange bilaterally without wheezing or rales. ABDOMEN: Soft and non-tender with normal pitched bowel sounds.  MUSCULOSKELETAL: There are no major deformities or cyanosis. NEUROLOGIC: No focal weakness or paresthesias are detected. SKIN: There are no ulcers or rashes noted. PSYCHIATRIC: The patient has a normal affect.  DATA:  I have reviewed her fistulogram that was sent with her the results are described above.  MEDICAL ISSUES: PSEUDOANEURYSM OF LEFT BRACHIOCEPHALIC AV FISTULA: The patient has a significant pseudoaneurysm of her left brachiocephalic AV fistula. I think that this could compromise of flow in the fistula and also limits the areas that can be cannulated. In addition this is at risk for enlarging and becoming even more problematic. This reason I recommended exploration of the pseudoaneurysm and repair. This may require patch angioplasty. She thinks that she dialyzes on Tuesdays Thursdays and Saturdays so we will arrange to have this done on Friday, 01/31/2015. She'll use her  catheter for now for dialysis.   Dickson, Christopher [facility]: [phone]    

## 2015-03-09 ENCOUNTER — Encounter: Payer: Self-pay | Admitting: Internal Medicine

## 2015-03-09 DIAGNOSIS — R627 Adult failure to thrive: Secondary | ICD-10-CM

## 2015-03-09 HISTORY — DX: Adult failure to thrive: R62.7

## 2015-03-10 ENCOUNTER — Other Ambulatory Visit: Payer: Medicare Other

## 2015-03-10 ENCOUNTER — Encounter (HOSPITAL_COMMUNITY): Payer: Self-pay | Admitting: Vascular Surgery

## 2015-03-10 DIAGNOSIS — D638 Anemia in other chronic diseases classified elsewhere: Secondary | ICD-10-CM

## 2015-03-10 DIAGNOSIS — E1129 Type 2 diabetes mellitus with other diabetic kidney complication: Secondary | ICD-10-CM

## 2015-03-10 DIAGNOSIS — IMO0002 Reserved for concepts with insufficient information to code with codable children: Secondary | ICD-10-CM

## 2015-03-10 DIAGNOSIS — E785 Hyperlipidemia, unspecified: Secondary | ICD-10-CM

## 2015-03-10 DIAGNOSIS — E1165 Type 2 diabetes mellitus with hyperglycemia: Principal | ICD-10-CM

## 2015-03-10 DIAGNOSIS — I10 Essential (primary) hypertension: Secondary | ICD-10-CM

## 2015-03-11 LAB — LIPID PANEL
CHOL/HDL RATIO: 4.1 ratio (ref 0.0–4.4)
Cholesterol, Total: 160 mg/dL (ref 100–199)
HDL: 39 mg/dL — ABNORMAL LOW (ref >39)
LDL Calculated: 104 mg/dL — ABNORMAL HIGH (ref 0–99)
Triglycerides: 87 mg/dL (ref 0–149)
VLDL Cholesterol Cal: 17 mg/dL (ref 5–40)

## 2015-03-11 LAB — CBC WITH DIFFERENTIAL
BASOS: 1 %
Basophils Absolute: 0 10*3/uL (ref 0.0–0.2)
EOS (ABSOLUTE): 0.6 10*3/uL — ABNORMAL HIGH (ref 0.0–0.4)
EOS: 9 %
HEMATOCRIT: 34 % (ref 34.0–46.6)
Hemoglobin: 10.9 g/dL — ABNORMAL LOW (ref 11.1–15.9)
IMMATURE GRANS (ABS): 0 10*3/uL (ref 0.0–0.1)
Immature Granulocytes: 0 %
LYMPHS ABS: 1.2 10*3/uL (ref 0.7–3.1)
Lymphs: 18 %
MCH: 29.9 pg (ref 26.6–33.0)
MCHC: 32.1 g/dL (ref 31.5–35.7)
MCV: 93 fL (ref 79–97)
MONOCYTES: 11 %
Monocytes Absolute: 0.7 10*3/uL (ref 0.1–0.9)
NEUTROS ABS: 4 10*3/uL (ref 1.4–7.0)
Neutrophils: 61 %
RBC: 3.64 x10E6/uL — AB (ref 3.77–5.28)
RDW: 15.8 % — AB (ref 12.3–15.4)
WBC: 6.6 10*3/uL (ref 3.4–10.8)

## 2015-03-11 LAB — COMPREHENSIVE METABOLIC PANEL
ALT: 15 IU/L (ref 0–32)
AST: 23 IU/L (ref 0–40)
Albumin/Globulin Ratio: 1.1 (ref 1.1–2.5)
Albumin: 3.3 g/dL — ABNORMAL LOW (ref 3.5–4.7)
Alkaline Phosphatase: 101 IU/L (ref 39–117)
BUN/Creatinine Ratio: 12 (ref 11–26)
BUN: 64 mg/dL — AB (ref 8–27)
Bilirubin Total: <0.2 mg/dL (ref 0.0–1.2)
CO2: 24 mmol/L (ref 18–29)
Calcium: 9.5 mg/dL (ref 8.7–10.3)
Chloride: 85 mmol/L — ABNORMAL LOW (ref 97–108)
Creatinine, Ser: 5.26 mg/dL — ABNORMAL HIGH (ref 0.57–1.00)
GFR calc Af Amer: 8 mL/min/{1.73_m2} — ABNORMAL LOW (ref >59)
GFR calc non Af Amer: 7 mL/min/{1.73_m2} — ABNORMAL LOW (ref >59)
GLUCOSE: 57 mg/dL — AB (ref 65–99)
Globulin, Total: 2.9 g/dL (ref 1.5–4.5)
POTASSIUM: 4.9 mmol/L (ref 3.5–5.2)
Sodium: 131 mmol/L — ABNORMAL LOW (ref 134–144)
Total Protein: 6.2 g/dL (ref 6.0–8.5)

## 2015-03-11 LAB — HEMOGLOBIN A1C
Est. average glucose Bld gHb Est-mCnc: 117 mg/dL
Hgb A1c MFr Bld: 5.7 % — ABNORMAL HIGH (ref 4.8–5.6)

## 2015-03-12 ENCOUNTER — Inpatient Hospital Stay (HOSPITAL_COMMUNITY)
Admission: EM | Admit: 2015-03-12 | Discharge: 2015-03-15 | DRG: 637 | Disposition: A | Discharge status: Home Health | Payer: Medicare Other | Attending: Family Medicine | Admitting: Family Medicine

## 2015-03-12 ENCOUNTER — Encounter (HOSPITAL_COMMUNITY): Payer: Self-pay | Admitting: Emergency Medicine

## 2015-03-12 ENCOUNTER — Ambulatory Visit: Payer: Medicare Other | Admitting: Internal Medicine

## 2015-03-12 ENCOUNTER — Emergency Department (HOSPITAL_COMMUNITY): Payer: Medicare Other

## 2015-03-12 DIAGNOSIS — R296 Repeated falls: Secondary | ICD-10-CM | POA: Diagnosis present

## 2015-03-12 DIAGNOSIS — N2581 Secondary hyperparathyroidism of renal origin: Secondary | ICD-10-CM | POA: Diagnosis present

## 2015-03-12 DIAGNOSIS — L209 Atopic dermatitis, unspecified: Secondary | ICD-10-CM | POA: Diagnosis present

## 2015-03-12 DIAGNOSIS — D638 Anemia in other chronic diseases classified elsewhere: Secondary | ICD-10-CM | POA: Diagnosis present

## 2015-03-12 DIAGNOSIS — E875 Hyperkalemia: Secondary | ICD-10-CM | POA: Diagnosis present

## 2015-03-12 DIAGNOSIS — E43 Unspecified severe protein-calorie malnutrition: Secondary | ICD-10-CM | POA: Diagnosis present

## 2015-03-12 DIAGNOSIS — R627 Adult failure to thrive: Secondary | ICD-10-CM | POA: Diagnosis present

## 2015-03-12 DIAGNOSIS — F0390 Unspecified dementia without behavioral disturbance: Secondary | ICD-10-CM | POA: Diagnosis present

## 2015-03-12 DIAGNOSIS — N390 Urinary tract infection, site not specified: Secondary | ICD-10-CM | POA: Diagnosis present

## 2015-03-12 DIAGNOSIS — E162 Hypoglycemia, unspecified: Secondary | ICD-10-CM | POA: Diagnosis present

## 2015-03-12 DIAGNOSIS — Z87891 Personal history of nicotine dependence: Secondary | ICD-10-CM

## 2015-03-12 DIAGNOSIS — I5022 Chronic systolic (congestive) heart failure: Secondary | ICD-10-CM | POA: Diagnosis present

## 2015-03-12 DIAGNOSIS — Z992 Dependence on renal dialysis: Secondary | ICD-10-CM

## 2015-03-12 DIAGNOSIS — N186 End stage renal disease: Secondary | ICD-10-CM | POA: Diagnosis present

## 2015-03-12 DIAGNOSIS — E860 Dehydration: Secondary | ICD-10-CM | POA: Diagnosis present

## 2015-03-12 DIAGNOSIS — IMO0002 Reserved for concepts with insufficient information to code with codable children: Secondary | ICD-10-CM | POA: Diagnosis present

## 2015-03-12 DIAGNOSIS — E1129 Type 2 diabetes mellitus with other diabetic kidney complication: Secondary | ICD-10-CM | POA: Diagnosis present

## 2015-03-12 DIAGNOSIS — Z681 Body mass index (BMI) 19 or less, adult: Secondary | ICD-10-CM

## 2015-03-12 DIAGNOSIS — E1151 Type 2 diabetes mellitus with diabetic peripheral angiopathy without gangrene: Secondary | ICD-10-CM | POA: Diagnosis present

## 2015-03-12 DIAGNOSIS — I120 Hypertensive chronic kidney disease with stage 5 chronic kidney disease or end stage renal disease: Secondary | ICD-10-CM | POA: Diagnosis present

## 2015-03-12 DIAGNOSIS — R413 Other amnesia: Secondary | ICD-10-CM | POA: Diagnosis present

## 2015-03-12 DIAGNOSIS — I420 Dilated cardiomyopathy: Secondary | ICD-10-CM | POA: Diagnosis present

## 2015-03-12 DIAGNOSIS — F329 Major depressive disorder, single episode, unspecified: Secondary | ICD-10-CM | POA: Diagnosis present

## 2015-03-12 DIAGNOSIS — E1122 Type 2 diabetes mellitus with diabetic chronic kidney disease: Secondary | ICD-10-CM | POA: Diagnosis present

## 2015-03-12 DIAGNOSIS — R410 Disorientation, unspecified: Secondary | ICD-10-CM

## 2015-03-12 DIAGNOSIS — E11649 Type 2 diabetes mellitus with hypoglycemia without coma: Principal | ICD-10-CM | POA: Diagnosis present

## 2015-03-12 DIAGNOSIS — E1165 Type 2 diabetes mellitus with hyperglycemia: Secondary | ICD-10-CM | POA: Diagnosis not present

## 2015-03-12 DIAGNOSIS — R634 Abnormal weight loss: Secondary | ICD-10-CM

## 2015-03-12 LAB — URINE MICROSCOPIC-ADD ON

## 2015-03-12 LAB — CBC WITH DIFFERENTIAL/PLATELET
Basophils Absolute: 0 10*3/uL (ref 0.0–0.1)
Basophils Relative: 0 % (ref 0–1)
EOS ABS: 0.1 10*3/uL (ref 0.0–0.7)
Eosinophils Relative: 2 % (ref 0–5)
HEMATOCRIT: 42.1 % (ref 36.0–46.0)
HEMOGLOBIN: 13.8 g/dL (ref 12.0–15.0)
Lymphocytes Relative: 14 % (ref 12–46)
Lymphs Abs: 0.8 10*3/uL (ref 0.7–4.0)
MCH: 29.7 pg (ref 26.0–34.0)
MCHC: 32.8 g/dL (ref 30.0–36.0)
MCV: 90.5 fL (ref 78.0–100.0)
Monocytes Absolute: 0.6 10*3/uL (ref 0.1–1.0)
Monocytes Relative: 10 % (ref 3–12)
NEUTROS PCT: 74 % (ref 43–77)
Neutro Abs: 4.5 10*3/uL (ref 1.7–7.7)
Platelets: 276 10*3/uL (ref 150–400)
RBC: 4.65 MIL/uL (ref 3.87–5.11)
RDW: 15.9 % — ABNORMAL HIGH (ref 11.5–15.5)
WBC: 6 10*3/uL (ref 4.0–10.5)

## 2015-03-12 LAB — URINALYSIS, ROUTINE W REFLEX MICROSCOPIC
Glucose, UA: NEGATIVE mg/dL
Ketones, ur: NEGATIVE mg/dL
Nitrite: NEGATIVE
Protein, ur: >300 mg/dL — AB
Specific Gravity, Urine: 1.025 (ref 1.005–1.030)
UROBILINOGEN UA: 0.2 mg/dL (ref 0.0–1.0)
pH: 6.5 (ref 5.0–8.0)

## 2015-03-12 LAB — VITAMIN B12: VITAMIN B 12: 599 pg/mL (ref 180–914)

## 2015-03-12 LAB — I-STAT CHEM 8, ED
BUN: 87 mg/dL — ABNORMAL HIGH (ref 6–20)
CALCIUM ION: 1.2 mmol/L (ref 1.13–1.30)
CHLORIDE: 95 mmol/L — AB (ref 101–111)
Creatinine, Ser: 7.3 mg/dL — ABNORMAL HIGH (ref 0.44–1.00)
GLUCOSE: 25 mg/dL — AB (ref 65–99)
HCT: 48 % — ABNORMAL HIGH (ref 36.0–46.0)
Hemoglobin: 16.3 g/dL — ABNORMAL HIGH (ref 12.0–15.0)
POTASSIUM: 5.6 mmol/L — AB (ref 3.5–5.1)
Sodium: 129 mmol/L — ABNORMAL LOW (ref 135–145)
TCO2: 25 mmol/L (ref 0–100)

## 2015-03-12 LAB — CBG MONITORING, ED
Comment 1: 0
GLUCOSE-CAPILLARY: 57 mg/dL — AB (ref 65–99)
GLUCOSE-CAPILLARY: 109 mg/dL — AB (ref 65–99)
GLUCOSE-CAPILLARY: 47 mg/dL — AB (ref 65–99)
GLUCOSE-CAPILLARY: 69 mg/dL (ref 65–99)
GLUCOSE-CAPILLARY: 105 mg/dL — AB (ref 65–99)

## 2015-03-12 LAB — TSH: TSH: 4.131 u[IU]/mL (ref 0.350–4.500)

## 2015-03-12 LAB — T4, FREE: FREE T4: 1.14 ng/dL — AB (ref 0.61–1.12)

## 2015-03-12 LAB — GLUCOSE, CAPILLARY: GLUCOSE-CAPILLARY: 105 mg/dL — AB (ref 65–99)

## 2015-03-12 LAB — BASIC METABOLIC PANEL
ANION GAP: 18 — AB (ref 5–15)
BUN: 95 mg/dL — AB (ref 6–20)
CALCIUM: 10.9 mg/dL — AB (ref 8.9–10.3)
CHLORIDE: 87 mmol/L — AB (ref 101–111)
CO2: 27 mmol/L (ref 22–32)
CREATININE: 7.75 mg/dL — AB (ref 0.44–1.00)
GFR calc Af Amer: 5 mL/min — ABNORMAL LOW (ref >60)
GFR calc non Af Amer: 4 mL/min — ABNORMAL LOW (ref >60)
GLUCOSE: 34 mg/dL — AB (ref 65–99)
Potassium: 6 mmol/L — ABNORMAL HIGH (ref 3.5–5.1)
Sodium: 132 mmol/L — ABNORMAL LOW (ref 135–145)

## 2015-03-12 MED ORDER — ACETAMINOPHEN 500 MG PO TABS: 500.0000 mg | ORAL_TABLET | Freq: Four times a day (QID) | ORAL | Status: DC | PRN

## 2015-03-12 MED ORDER — RENA-VITE PO TABS
1.0000 | ORAL_TABLET | Freq: Every day | ORAL | Status: DC
  Administered 2015-03-12 – 2015-03-14 (×3): 1 via ORAL
  Filled 2015-03-12 (×3): qty 1

## 2015-03-12 MED ORDER — ENSURE ENLIVE PO LIQD
237.0000 mL | Freq: Two times a day (BID) | ORAL | Status: DC
  Administered 2015-03-12 – 2015-03-14 (×3): 237 mL via ORAL
  Filled 2015-03-12: qty 237

## 2015-03-12 MED ORDER — ONDANSETRON HCL 4 MG PO TABS: 4.0000 mg | ORAL_TABLET | Freq: Four times a day (QID) | ORAL | Status: DC | PRN

## 2015-03-12 MED ORDER — ACETAMINOPHEN 650 MG RE SUPP: 650.0000 mg | Freq: Four times a day (QID) | RECTAL | Status: DC | PRN

## 2015-03-12 MED ORDER — LABETALOL HCL 5 MG/ML IV SOLN
10.0000 mg | Freq: Once | INTRAVENOUS | Status: AC
  Administered 2015-03-12: 10 mg via INTRAVENOUS
  Filled 2015-03-12: qty 4

## 2015-03-12 MED ORDER — NEPRO/CARBSTEADY PO LIQD
237.0000 mL | Freq: Two times a day (BID) | ORAL | Status: DC
  Administered 2015-03-14 – 2015-03-15 (×4): 237 mL via ORAL

## 2015-03-12 MED ORDER — SODIUM POLYSTYRENE SULFONATE 15 GM/60ML PO SUSP
15.0000 g | Freq: Once | ORAL | Status: DC
  Filled 2015-03-12: qty 60

## 2015-03-12 MED ORDER — MIRTAZAPINE 15 MG PO TABS
15.0000 mg | ORAL_TABLET | Freq: Every day | ORAL | Status: DC
  Administered 2015-03-12 – 2015-03-14 (×3): 15 mg via ORAL
  Filled 2015-03-12 (×3): qty 1

## 2015-03-12 MED ORDER — IOHEXOL 300 MG/ML  SOLN
25.0000 mL | INTRAMUSCULAR | Status: AC
  Administered 2015-03-12 (×2): 25 mL via ORAL

## 2015-03-12 MED ORDER — AMLODIPINE BESYLATE 10 MG PO TABS
10.0000 mg | ORAL_TABLET | Freq: Every day | ORAL | Status: DC
  Administered 2015-03-12 – 2015-03-14 (×3): 10 mg via ORAL
  Filled 2015-03-12 (×3): qty 1

## 2015-03-12 MED ORDER — ACETAMINOPHEN 325 MG PO TABS
650.0000 mg | ORAL_TABLET | Freq: Four times a day (QID) | ORAL | Status: DC | PRN
  Administered 2015-03-12 – 2015-03-13 (×2): 650 mg via ORAL
  Filled 2015-03-12 (×2): qty 2

## 2015-03-12 MED ORDER — CARVEDILOL 12.5 MG PO TABS
12.5000 mg | ORAL_TABLET | Freq: Two times a day (BID) | ORAL | Status: DC
  Administered 2015-03-13 – 2015-03-14 (×4): 12.5 mg via ORAL
  Filled 2015-03-12 (×4): qty 1

## 2015-03-12 MED ORDER — LIDOCAINE-PRILOCAINE 2.5-2.5 % EX CREA
1.0000 "application " | TOPICAL_CREAM | CUTANEOUS | Status: DC | PRN
  Filled 2015-03-12: qty 5

## 2015-03-12 MED ORDER — SODIUM CHLORIDE 0.9 % IV SOLN
125.0000 mg | INTRAVENOUS | Status: DC
  Administered 2015-03-13: 125 mg via INTRAVENOUS
  Filled 2015-03-12 (×2): qty 10

## 2015-03-12 MED ORDER — SERTRALINE HCL 50 MG PO TABS
50.0000 mg | ORAL_TABLET | Freq: Every day | ORAL | Status: DC
  Administered 2015-03-12 – 2015-03-15 (×4): 50 mg via ORAL
  Filled 2015-03-12 (×4): qty 1

## 2015-03-12 MED ORDER — HYDRALAZINE HCL 25 MG PO TABS
25.0000 mg | ORAL_TABLET | Freq: Three times a day (TID) | ORAL | Status: DC
  Administered 2015-03-12 – 2015-03-15 (×7): 25 mg via ORAL
  Filled 2015-03-12 (×7): qty 1

## 2015-03-12 MED ORDER — ONDANSETRON HCL 4 MG/2ML IJ SOLN: 4.0000 mg | Freq: Four times a day (QID) | INTRAMUSCULAR | Status: DC | PRN

## 2015-03-12 MED ORDER — DEXTROSE 50 % IV SOLN
INTRAVENOUS | Status: AC
  Administered 2015-03-12: 50 mL via INTRAVENOUS
  Filled 2015-03-12: qty 50

## 2015-03-12 MED ORDER — DEXTROSE 5 % IV SOLN
1.0000 g | INTRAVENOUS | Status: AC
  Administered 2015-03-13 – 2015-03-14 (×2): 1 g via INTRAVENOUS
  Filled 2015-03-12 (×3): qty 10

## 2015-03-12 MED ORDER — ALBUTEROL SULFATE (2.5 MG/3ML) 0.083% IN NEBU
5.0000 mg | INHALATION_SOLUTION | Freq: Once | RESPIRATORY_TRACT | Status: AC
  Administered 2015-03-12: 5 mg via RESPIRATORY_TRACT
  Filled 2015-03-12: qty 6

## 2015-03-12 MED ORDER — DEXTROSE 5 % IV SOLN
1.0000 g | Freq: Once | INTRAVENOUS | Status: AC
  Administered 2015-03-12: 1 g via INTRAVENOUS
  Filled 2015-03-12: qty 10

## 2015-03-12 MED ORDER — DEXTROSE 50 % IV SOLN
1.0000 | Freq: Once | INTRAVENOUS | Status: AC
  Administered 2015-03-12: 50 mL via INTRAVENOUS

## 2015-03-12 NOTE — ED Provider Notes (Signed)
CSN: [id number]     Arrival date & time 03/12/15  1225 History   First MD Initiated Contact with Patient 03/12/15 1229     Chief Complaint  Patient presents with  . Hypoglycemia  . Fall     (Consider location/radiation/quality/duration/timing/severity/associated sxs/prior Treatment) HPI Comments: 81 year old female with diabetes, nutrition, end-stage renal disease on dialysis missed dialysis yesterday forreason, lives alone, depression, high blood pressure presents with hypoglycemia, recurrent falls. Patient is a decreased appetite for the past few months and was taken off her diabetic medications. Despite that she still having low glucose episodes and had one this morning causing her to fall and hit her head. No loss consciousness. Patient says she recalls falling with mild confused. Patient has been resistant to help at home however now is open to the idea she agrees she needs help. Family in the room currently.  Patient is a 81 y.o. female presenting with hypoglycemia and fall. The history is provided by the patient and medical records.  Hypoglycemia Associated symptoms: weakness (general)   Associated symptoms: no shortness of breath and no vomiting   Fall Pertinent negatives include no chest pain, no abdominal pain, no headaches and no shortness of breath.    Past Medical History  Diagnosis Date  . Urinary frequency   . Routine general medical examination at a health care facility   . Other atopic dermatitis and related conditions   . Rash and other nonspecific skin eruption   . Other anxiety states   . Insomnia, unspecified   . PVD (peripheral vascular disease)   . Unspecified vitamin D deficiency   . Anemia, unspecified   . Obesity, unspecified   . Nocturia   . Pain in joint, lower leg   . Other and unspecified hyperlipidemia   . Unspecified essential hypertension   . Cataract   . CHF (congestive heart failure)   . Thrombocytopenia   . Chronic kidney disease, stage II  (mild)     [facility]  . Type II or unspecified type diabetes mellitus with renal manifestations, not stated as uncontrolled   . Memory loss 02/10/2015  . Dementia     forgetful, .  . Failure to thrive in adult 03/09/2015   Past Surgical History  Procedure Laterality Date  . Abdominal hysterectomy  1970  . Breast surgery  1979    nodule removed  . Eye surgery  2004    catarcts removed from right eye.  . Multiple tooth extractions  09/2013    Remonve remaining 3 teeth, no with dentures   . Av fistula placement Left 09/06/2014    Procedure: CREATION OF LEFT UPPER ARM ARTERIOVENOUS (AV) FISTULA ;  Surgeon: Pryor Ochoa, MD;  Location: [facility] OR;  Service: Vascular;  Laterality: Left;  . Insertion of dialysis catheter N/A 09/06/2014    Procedure: INSERTION OF DIALYSIS CATHETER RIGHT INTERNAL JUGULAR VEIN;  Surgeon: Pryor Ochoa, MD;  Location: [facility] OR;  Service: Vascular;  Laterality: N/A;  . Ligation of competing branches of arteriovenous fistula Left 09/06/2014    Procedure: LIGATION OF COMPETING BRANCHES OF ARTERIOVENOUS FISTULA;  Surgeon: Pryor Ochoa, MD;  Location: [facility] OR;  Service: Vascular;  Laterality: Left;  . Left heart catheterization with coronary angiogram N/A 09/17/2014    Procedure: LEFT HEART CATHETERIZATION WITH CORONARY ANGIOGRAM;  Surgeon: Lennette Bihari, MD;  Location: [facility] CATH LAB;  Service: Cardiovascular;  Laterality: N/A;  . False aneurysm repair Left 03/07/2015    Procedure: REPAIR PSEUDOANEURYSMS  OF LEFT ARM ARTERIOVENOUS FISTULA;  Surgeon: Chuck Hint, MD;  Location: [facility] OR;  Service: Vascular;  Laterality: Left;   Family History  Problem Relation Age of Onset  . Diabetes Mother    History  Substance Use Topics  . Smoking status: Former Smoker    Types: Cigarettes    Quit date: 08/10/2000  . Smokeless tobacco: Never Used  . Alcohol Use: No   OB History    No data available     Review of Systems  Constitutional: Negative for  fever and chills.  HENT: Negative for congestion.   Eyes: Negative for visual disturbance.  Respiratory: Negative for shortness of breath.   Cardiovascular: Negative for chest pain.  Gastrointestinal: Negative for vomiting and abdominal pain.  Genitourinary: Negative for dysuria and flank pain.  Musculoskeletal: Negative for back pain, neck pain and neck stiffness.  Skin: Positive for wound. Negative for rash.  Neurological: Positive for weakness (general). Negative for light-headedness and headaches.  Psychiatric/Behavioral: Positive for confusion.      Allergies  Heparin  Home Medications   Prior to Admission medications   Medication Sig Start Date End Date Taking? Authorizing Provider  acetaminophen (TYLENOL) 500 MG tablet Take 500 mg by mouth every 6 (six) hours as needed (for pain).    Yes Historical Provider, MD  amLODipine (NORVASC) 10 MG tablet Take 10 mg by mouth at bedtime.   Yes Historical Provider, MD  carvedilol (COREG) 12.5 MG tablet One twice daily to strengthen the heart and control BP Patient taking differently: Take 12.5 mg by mouth 2 (two) times daily with a meal. One twice daily to strengthen the heart and control BP 02/12/15  Yes [name], MD  ENSURE (ENSURE) Take 237 mLs by mouth 3 (three) times daily between meals.   Yes Historical Provider, MD  fluocinonide cream (LIDEX) 0.05 % Apply 1 application topically 2 (two) times daily. Apply from neck down twice a day (provided by dermatology).   Yes Historical Provider, MD  hydrALAZINE (APRESOLINE) 25 MG tablet Take 1 tablet (25 mg total) by mouth every 8 (eight) hours. 11/07/14  Yes [name], MD  lidocaine-prilocaine (EMLA) cream Apply 1 application topically as needed (used on dialysis days).    Yes Historical Provider, MD  mirtazapine (REMERON) 15 MG tablet Take 15 mg by mouth at bedtime.   Yes Historical Provider, MD  sertraline (ZOLOFT) 50 MG tablet Take 1 tablet (50 mg total) by mouth daily. 02/12/15  Yes  [name], MD  glucose blood (BAYER CONTOUR NEXT TEST) test strip Use as instructed 11/27/12   Edison Pace, RPH-CPP  oxyCODONE (ROXICODONE) 5 MG immediate release tablet Take 1 tablet (5 mg total) by mouth every 6 (six) hours as needed. Patient not taking: Reported on 03/12/2015 03/07/15   Samantha J Rhyne, PA-C   BP 181/71 mmHg  Pulse 69  Temp(Src) 97.7 F (36.5 C) (Oral)  Resp 19  Ht 5' (1.524 m)  Wt 100 lb (45.36 kg)  BMI 19.53 kg/m2  SpO2 98% Physical Exam  Constitutional: She is oriented to person, place, and time. She appears well-developed and well-nourished.  HENT:  Head: Normocephalic and atraumatic.  Mild dry mucous membranes  Eyes: Right eye exhibits no discharge. Left eye exhibits no discharge.  Neck: Normal range of motion. Neck supple. No tracheal deviation present.  Cardiovascular: Normal rate and regular rhythm.   Pulmonary/Chest: Effort normal and breath sounds normal.  Abdominal: Soft. She exhibits no distension. There is no  tenderness. There is no guarding.  Musculoskeletal: She exhibits no edema.  No midline neck or back tenderness, neck full range of motion. No pain with flexion of the hip or knees bilateral.  Neurological: She is alert and oriented to person, place, and time. GCS eye subscore is 4. GCS verbal subscore is 5. GCS motor subscore is 6.  Patient moves all her symptoms equal bilateral 5+ strength, sensation intact upper and lower exam is palpation, pupils equal, extraocular muscle function intact. Very slight confusion at times however patient overall close to baseline per family.  Skin: Skin is warm. No rash noted.  Psychiatric:  Very slight confusion/delay in response at times  Nursing note and vitals reviewed.   ED Course  Procedures (including critical care time) Labs Review Labs Reviewed  BASIC METABOLIC PANEL - Abnormal; Notable for the following:    Sodium 132 (*)    Potassium 6.0 (*)    Chloride 87 (*)    Glucose, Bld 34 (*)     BUN 95 (*)    Creatinine, Ser 7.75 (*)    Calcium 10.9 (*)    GFR calc non Af Amer 4 (*)    GFR calc Af Amer 5 (*)    Anion gap 18 (*)    All other components within normal limits  CBC WITH DIFFERENTIAL/PLATELET - Abnormal; Notable for the following:    RDW 15.9 (*)    All other components within normal limits  URINALYSIS, ROUTINE W REFLEX MICROSCOPIC (NOT AT [facility]) - Abnormal; Notable for the following:    APPearance TURBID (*)    Hgb urine dipstick MODERATE (*)    Bilirubin Urine SMALL (*)    Protein, ur >300 (*)    Leukocytes, UA LARGE (*)    All other components within normal limits  URINE MICROSCOPIC-ADD ON - Abnormal; Notable for the following:    Squamous Epithelial / LPF MANY (*)    All other components within normal limits  CBG MONITORING, ED - Abnormal; Notable for the following:    Glucose-Capillary 57 (*)    All other components within normal limits  CBG MONITORING, ED - Abnormal; Notable for the following:    Glucose-Capillary 109 (*)    All other components within normal limits  I-STAT CHEM 8, ED - Abnormal; Notable for the following:    Sodium 129 (*)    Potassium 5.6 (*)    Chloride 95 (*)    BUN 87 (*)    Creatinine, Ser 7.30 (*)    Glucose, Bld 25 (*)    Hemoglobin 16.3 (*)    HCT 48.0 (*)    All other components within normal limits  URINE CULTURE    Imaging Review Ct Head Wo Contrast  03/12/2015   CLINICAL DATA:  Fall, head injury, left frontal headache  EXAM: CT HEAD WITHOUT CONTRAST  CT CERVICAL SPINE WITHOUT CONTRAST  TECHNIQUE: Multidetector CT imaging of the head and cervical spine was performed following the standard protocol without intravenous contrast. Multiplanar CT image reconstructions of the cervical spine were also generated.  COMPARISON:  None.  FINDINGS: CT HEAD FINDINGS  No skull fracture is noted. No intracranial hemorrhage, mass effect or midline shift. No acute cortical infarction. No mass lesion is noted on this unenhanced scan. Mild  cerebral atrophy. Mild periventricular white matter decreased attenuation probable due to chronic small vessel ischemic changes. There is small lacunar infarct in right cerebellum.  CT CERVICAL SPINE FINDINGS  Sagittal images of the cervical spine shows  no acute fracture or subluxation. Axial images of the cervical spine shows no acute fracture or subluxation. There is no pneumothorax in visualized lung apices.  Degenerative changes are noted C1-C2 articulation. Mild disc space flattening with anterior spurring at C2-C3 level. Degenerative changes with disc space flattening and mild anterior spurring with endplate cystic changes noted at C3-C4-C4-C5 C5-C6 level. Moderate disc space flattening with mild anterior spurring at C6-C7 level. Moderate disc space flattening with anterior spurring at C7-T1 level. There is no prevertebral soft tissue swelling. Cervical airway is patent.  IMPRESSION: 1. No acute intracranial abnormality. Mild cerebral atrophy. There is small lacunar infarct in right cerebellum. 2. No cervical spine acute fracture or subluxation. Multilevel degenerative changes as described above.   Electronically Signed   By: Natasha Mead M.D.   On: 03/12/2015 14:56   Ct Cervical Spine Wo Contrast  03/12/2015   CLINICAL DATA:  Fall, head injury, left frontal headache  EXAM: CT HEAD WITHOUT CONTRAST  CT CERVICAL SPINE WITHOUT CONTRAST  TECHNIQUE: Multidetector CT imaging of the head and cervical spine was performed following the standard protocol without intravenous contrast. Multiplanar CT image reconstructions of the cervical spine were also generated.  COMPARISON:  None.  FINDINGS: CT HEAD FINDINGS  No skull fracture is noted. No intracranial hemorrhage, mass effect or midline shift. No acute cortical infarction. No mass lesion is noted on this unenhanced scan. Mild cerebral atrophy. Mild periventricular white matter decreased attenuation probable due to chronic small vessel ischemic changes. There is small  lacunar infarct in right cerebellum.  CT CERVICAL SPINE FINDINGS  Sagittal images of the cervical spine shows no acute fracture or subluxation. Axial images of the cervical spine shows no acute fracture or subluxation. There is no pneumothorax in visualized lung apices.  Degenerative changes are noted C1-C2 articulation. Mild disc space flattening with anterior spurring at C2-C3 level. Degenerative changes with disc space flattening and mild anterior spurring with endplate cystic changes noted at C3-C4-C4-C5 C5-C6 level. Moderate disc space flattening with mild anterior spurring at C6-C7 level. Moderate disc space flattening with anterior spurring at C7-T1 level. There is no prevertebral soft tissue swelling. Cervical airway is patent.  IMPRESSION: 1. No acute intracranial abnormality. Mild cerebral atrophy. There is small lacunar infarct in right cerebellum. 2. No cervical spine acute fracture or subluxation. Multilevel degenerative changes as described above.   Electronically Signed   By: Natasha Mead M.D.   On: 03/12/2015 14:56     EKG Interpretation   Date/Time:  Wednesday March 12 2015 12:34:34 EDT Ventricular Rate:  63 PR Interval:  132 QRS Duration: 78 QT Interval:  421 QTC Calculation: 431 R Axis:   83 Text Interpretation:  Sinus rhythm Probable left atrial enlargement  Borderline right axis deviation Probable left ventricular hypertrophy  Confirmed by Jodi Mourning  MD, Calvary Difranco ([id number]) on 03/12/2015 1:01:30 PM Also  confirmed by Jodi Mourning  MD, Saidee Geremia ([id number])  on 03/12/2015 1:59:57 PM      MDM   Final diagnoses:  Hyperkalemia  ESRD (end stage renal disease) on dialysis  Hypoglycemia  Failure to thrive in adult  Confusion  UTI (lower urinary tract infection)   Patient presents with multiple different concerns. Hypoglycemia likely from failure to thrive and decreased appetite, patient currently denies diabetic medications. Patient required multiple D5 injections and is tolerating oral  currently. Hyperkalemia without EKG changes, patient will need dialysis urgently, unable to give insulin however will to albuterol at this time. Failure to thrive and socially needs help at  home. Page case management.  Discussed admission with Dr. Jomarie Longs who agrees with telemetry.  The patients results and plan were reviewed and discussed.   Any x-rays performed were independently reviewed by myself.   Differential diagnosis were considered with the presenting HPI.  Medications  feeding supplement (ENSURE ENLIVE) (ENSURE ENLIVE) liquid 237 mL (not administered)  cefTRIAXone (ROCEPHIN) 1 g in dextrose 5 % 50 mL IVPB (not administered)  albuterol (PROVENTIL) (2.5 MG/3ML) 0.083% nebulizer solution 5 mg (not administered)  labetalol (NORMODYNE,TRANDATE) injection 10 mg (not administered)  dextrose 50 % solution 50 mL (50 mLs Intravenous Given 03/12/15 1241)    Filed Vitals:   03/12/15 1500 03/12/15 1515 03/12/15 1530 03/12/15 1545  BP: 179/72 172/60 171/64 181/71  Pulse: 66 64 65 69  Temp:      TempSrc:      Resp: 16 16 16 19   Height:      Weight:      SpO2: 96% 99% 98% 98%    Final diagnoses:  Hyperkalemia  ESRD (end stage renal disease) on dialysis  Hypoglycemia  Failure to thrive in adult  Confusion  UTI (lower urinary tract infection)    Admission/ observation were discussed with the admitting physician, patient and/or family and they are comfortable with the plan.     Blane Ohara, MD 03/12/15 [id number]

## 2015-03-12 NOTE — ED Notes (Signed)
Denies loc, head or neck injurie/tenderness.  Patient has lac to right side of forehead with dried blood.

## 2015-03-12 NOTE — H&P (Addendum)
[facility] History and Physical  Natalie Wolfe GNF:[id number] DOB: [birth year] DOA: 03/12/2015  Referring physician:EDP PCP: [name], MD   Chief Complaint: Fall, low blood sugar  HPI: Natalie Wolfe is a 81 y.o. female with past medical history of memory loss/cognitive deficits, ESRD on HD Tuesday Thursday Saturday, diabetes mellitus now off medications recently by her PCP last week, severe malnutrition, chronic systolic CHF, hypertension presents to the ER with the above complaints. Patient is a poor historian and most of her history is corroborated by her niece at bedside she reports that she was trying to get ready for dialysis when actually she was getting ready for doctor's appointment in the next thing she knew she fell down and hit her head at the corner. She denies any loss of consciousness. Family says that she's had recurrent episodes of low blood sugars recently. Has been falling and has had worsening memory problems and episodic confusion in the last few weeks. She has been declining help from family and currently still lives alone. EMS found that she was hypoglycemic, and somewhat confused she was administered dextrose on route had recurrence of hypoglycemia which has since improved after by mouth intake. Patient's family also report that she's been losing a lot of weight over 20-30 pounds in the last couple of months, severe anorexia unable to eat or drink anything due to loss of appetite . She has been surviving on a few ensure a day. She missed dialysis yesterday and is unable to give a clear-cut reason why.  Review of Systems: Unreliable due to cognitive dysfunction Constitutional:  No weight loss, night sweats, Fevers, chills, fatigue.  HEENT:  No headaches, Difficulty swallowing,Tooth/dental problems,Sore throat,  No sneezing, itching, ear ache, nasal congestion, post nasal drip,  Cardio-vascular:  No chest pain, Orthopnea, PND, swelling in lower  extremities, anasarca, dizziness, palpitations  GI:  No heartburn, indigestion, abdominal pain, nausea, vomiting, diarrhea, change in bowel habits, loss of appetite  Resp:  No shortness of breath with exertion or at rest. No excess mucus, no productive cough, No non-productive cough, No coughing up of blood.No change in color of mucus.No wheezing.No chest wall deformity  Skin:  no rash or lesions.  GU:  no dysuria, change in color of urine, no urgency or frequency. No flank pain.  Musculoskeletal:  No joint pain or swelling. No decreased range of motion. No back pain.  Psych:  No change in mood or affect. No depression or anxiety. No memory loss.   Past Medical History  Diagnosis Date  . Urinary frequency   . Routine general medical examination at a health care facility   . Other atopic dermatitis and related conditions   . Rash and other nonspecific skin eruption   . Other anxiety states   . Insomnia, unspecified   . PVD (peripheral vascular disease)   . Unspecified vitamin D deficiency   . Anemia, unspecified   . Obesity, unspecified   . Nocturia   . Pain in joint, lower leg   . Other and unspecified hyperlipidemia   . Unspecified essential hypertension   . Cataract   . CHF (congestive heart failure)   . Thrombocytopenia   . Chronic kidney disease, stage II (mild)     [facility]  . Type II or unspecified type diabetes mellitus with renal manifestations, not stated as uncontrolled   . Memory loss 02/10/2015  . Dementia     forgetful, .  . Failure to thrive in adult 03/09/2015  Past Surgical History  Procedure Laterality Date  . Abdominal hysterectomy  1970  . Breast surgery  1979    nodule removed  . Eye surgery  2004    catarcts removed from right eye.  . Multiple tooth extractions  09/2013    Remonve remaining 3 teeth, no with dentures   . Av fistula placement Left 09/06/2014    Procedure: CREATION OF LEFT UPPER ARM ARTERIOVENOUS (AV)  FISTULA ;  Surgeon: Pryor Ochoa, MD;  Location: [facility] OR;  Service: Vascular;  Laterality: Left;  . Insertion of dialysis catheter N/A 09/06/2014    Procedure: INSERTION OF DIALYSIS CATHETER RIGHT INTERNAL JUGULAR VEIN;  Surgeon: Pryor Ochoa, MD;  Location: [facility] OR;  Service: Vascular;  Laterality: N/A;  . Ligation of competing branches of arteriovenous fistula Left 09/06/2014    Procedure: LIGATION OF COMPETING BRANCHES OF ARTERIOVENOUS FISTULA;  Surgeon: Pryor Ochoa, MD;  Location: [facility] OR;  Service: Vascular;  Laterality: Left;  . Left heart catheterization with coronary angiogram N/A 09/17/2014    Procedure: LEFT HEART CATHETERIZATION WITH CORONARY ANGIOGRAM;  Surgeon: Lennette Bihari, MD;  Location: [facility] CATH LAB;  Service: Cardiovascular;  Laterality: N/A;  . False aneurysm repair Left 03/07/2015    Procedure: REPAIR PSEUDOANEURYSMS OF LEFT ARM ARTERIOVENOUS FISTULA;  Surgeon: Chuck Hint, MD;  Location: [facility] OR;  Service: Vascular;  Laterality: Left;   Social History:  reports that she quit smoking about 14 years ago. Her smoking use included Cigarettes. She has never used smokeless tobacco. She reports that she does not drink alcohol or use illicit drugs.  Allergies  Allergen Reactions  . Heparin Other (See Comments)    Positive Hep Induced Plt Ab and SRA 09/12/2014    Family History  Problem Relation Age of Onset  . Diabetes Mother    Prior to Admission medications   Medication Sig Start Date End Date Taking? Authorizing Provider  acetaminophen (TYLENOL) 500 MG tablet Take 500 mg by mouth every 6 (six) hours as needed (for pain).    Yes Historical Provider, MD  amLODipine (NORVASC) 10 MG tablet Take 10 mg by mouth at bedtime.   Yes Historical Provider, MD  carvedilol (COREG) 12.5 MG tablet One twice daily to strengthen the heart and control BP Patient taking differently: Take 12.5 mg by mouth 2 (two) times daily with a meal. One twice daily to strengthen the heart and control BP  02/12/15  Yes [name], MD  ENSURE (ENSURE) Take 237 mLs by mouth 3 (three) times daily between meals.   Yes Historical Provider, MD  fluocinonide cream (LIDEX) 0.05 % Apply 1 application topically 2 (two) times daily. Apply from neck down twice a day (provided by dermatology).   Yes Historical Provider, MD  hydrALAZINE (APRESOLINE) 25 MG tablet Take 1 tablet (25 mg total) by mouth every 8 (eight) hours. 11/07/14  Yes [name], MD  lidocaine-prilocaine (EMLA) cream Apply 1 application topically as needed (used on dialysis days).    Yes Historical Provider, MD  mirtazapine (REMERON) 15 MG tablet Take 15 mg by mouth at bedtime.   Yes Historical Provider, MD  sertraline (ZOLOFT) 50 MG tablet Take 1 tablet (50 mg total) by mouth daily. 02/12/15  Yes [name], MD  glucose blood (BAYER CONTOUR NEXT TEST) test strip Use as instructed 11/27/12   Edison Pace, RPH-CPP  oxyCODONE (ROXICODONE) 5 MG immediate release tablet Take 1 tablet (5 mg total) by mouth every 6 (six) hours as  needed. Patient not taking: Reported on 03/12/2015 03/07/15   Dara Lords, PA-C   Physical Exam: Filed Vitals:   03/12/15 1500 03/12/15 1515 03/12/15 1530 03/12/15 1545  BP: 179/72 172/60 171/64 181/71  Pulse: 66 64 65 69  Temp:      TempSrc:      Resp: 16 16 16 19   Height:      Weight:      SpO2: 96% 99% 98% 98%    Wt Readings from Last 3 Encounters:  03/12/15 45.36 kg (100 lb)  03/07/15 46.448 kg (102 lb 6.4 oz)  03/05/15 46.448 kg (102 lb 6.4 oz)    General:  Frail elderly female, extremely cachectic, multiple superficial lacerations on the face Eyes: PERRL, normal lids, irises & conjunctiva ENT: grossly normal hearing, lips & tongue Neck: no LAD, masses or thyromegaly Cardiovascular: RRR, no m/r/g. No LE edema. Telemetry: SR, no arrhythmias  Respiratory: CTA bilaterally, no w/r/r. Normal respiratory effort. Abdomen: soft, mildly distended nontender, positive bowel sounds no organomegaly Skin:  no rash or induration seen on limited exam Musculoskeletal: grossly normal tone BUE/BLE Psychiatric: grossly normal mood and affect, speech fluent and appropriate Neurologic: grossly non-focal.          Labs on Admission:  Basic Metabolic Panel:  Recent Labs Lab 03/07/15 0954 03/10/15 0841 03/12/15 1252 03/12/15 1452  NA 130* 131* 132* 129*  K 4.2 4.9 6.0* 5.6*  CL  --  85* 87* 95*  CO2  --  24 27  --   GLUCOSE 106* 57* 34* 25*  BUN  --  64* 95* 87*  CREATININE  --  5.26* 7.75* 7.30*  CALCIUM  --  9.5 10.9*  --    Liver Function Tests:  Recent Labs Lab 03/10/15 0841  AST 23  ALT 15  ALKPHOS 101  BILITOT <0.2  PROT 6.2   No results for input(s): LIPASE, AMYLASE in the last 168 hours. No results for input(s): AMMONIA in the last 168 hours. CBC:  Recent Labs Lab 03/07/15 0954 03/10/15 0841 03/12/15 1252 03/12/15 1452  WBC  --  6.6 6.0  --   NEUTROABS  --  4.0 4.5  --   HGB 12.9  --  13.8 16.3*  HCT 38.0 34.0 42.1 48.0*  MCV  --   --  90.5  --   PLT  --   --  276  --    Cardiac Enzymes: No results for input(s): CKTOTAL, CKMB, CKMBINDEX, TROPONINI in the last 168 hours.  BNP (last 3 results) No results for input(s): BNP in the last 8760 hours.  ProBNP (last 3 results) No results for input(s): PROBNP in the last 8760 hours.  CBG:  Recent Labs Lab 03/07/15 0935 03/07/15 1146 03/07/15 1503 03/12/15 1238 03/12/15 1407  GLUCAP 93 84 82 57* 109*    Radiological Exams on Admission: Ct Head Wo Contrast  03/12/2015   CLINICAL DATA:  Fall, head injury, left frontal headache  EXAM: CT HEAD WITHOUT CONTRAST  CT CERVICAL SPINE WITHOUT CONTRAST  TECHNIQUE: Multidetector CT imaging of the head and cervical spine was performed following the standard protocol without intravenous contrast. Multiplanar CT image reconstructions of the cervical spine were also generated.  COMPARISON:  None.  FINDINGS: CT HEAD FINDINGS  No skull fracture is noted. No intracranial  hemorrhage, mass effect or midline shift. No acute cortical infarction. No mass lesion is noted on this unenhanced scan. Mild cerebral atrophy. Mild periventricular white matter decreased attenuation probable due to chronic  small vessel ischemic changes. There is small lacunar infarct in right cerebellum.  CT CERVICAL SPINE FINDINGS  Sagittal images of the cervical spine shows no acute fracture or subluxation. Axial images of the cervical spine shows no acute fracture or subluxation. There is no pneumothorax in visualized lung apices.  Degenerative changes are noted C1-C2 articulation. Mild disc space flattening with anterior spurring at C2-C3 level. Degenerative changes with disc space flattening and mild anterior spurring with endplate cystic changes noted at C3-C4-C4-C5 C5-C6 level. Moderate disc space flattening with mild anterior spurring at C6-C7 level. Moderate disc space flattening with anterior spurring at C7-T1 level. There is no prevertebral soft tissue swelling. Cervical airway is patent.  IMPRESSION: 1. No acute intracranial abnormality. Mild cerebral atrophy. There is small lacunar infarct in right cerebellum. 2. No cervical spine acute fracture or subluxation. Multilevel degenerative changes as described above.   Electronically Signed   By: Natasha Mead M.D.   On: 03/12/2015 14:56   Ct Cervical Spine Wo Contrast  03/12/2015   CLINICAL DATA:  Fall, head injury, left frontal headache  EXAM: CT HEAD WITHOUT CONTRAST  CT CERVICAL SPINE WITHOUT CONTRAST  TECHNIQUE: Multidetector CT imaging of the head and cervical spine was performed following the standard protocol without intravenous contrast. Multiplanar CT image reconstructions of the cervical spine were also generated.  COMPARISON:  None.  FINDINGS: CT HEAD FINDINGS  No skull fracture is noted. No intracranial hemorrhage, mass effect or midline shift. No acute cortical infarction. No mass lesion is noted on this unenhanced scan. Mild cerebral atrophy.  Mild periventricular white matter decreased attenuation probable due to chronic small vessel ischemic changes. There is small lacunar infarct in right cerebellum.  CT CERVICAL SPINE FINDINGS  Sagittal images of the cervical spine shows no acute fracture or subluxation. Axial images of the cervical spine shows no acute fracture or subluxation. There is no pneumothorax in visualized lung apices.  Degenerative changes are noted C1-C2 articulation. Mild disc space flattening with anterior spurring at C2-C3 level. Degenerative changes with disc space flattening and mild anterior spurring with endplate cystic changes noted at C3-C4-C4-C5 C5-C6 level. Moderate disc space flattening with mild anterior spurring at C6-C7 level. Moderate disc space flattening with anterior spurring at C7-T1 level. There is no prevertebral soft tissue swelling. Cervical airway is patent.  IMPRESSION: 1. No acute intracranial abnormality. Mild cerebral atrophy. There is small lacunar infarct in right cerebellum. 2. No cervical spine acute fracture or subluxation. Multilevel degenerative changes as described above.   Electronically Signed   By: Natasha Mead M.D.   On: 03/12/2015 14:56    EKG: Independently reviewed. NSR, LVH, no acute ST T wave changes  Assessment/Plan    Fall -could be related to debility/hypoglycemia -no syncope -would monitor on tele, Pt eval    Hypoglycemia/DM type 2 -last hbaic 5.7 on 03/10/15, supposed to have stopped sulphonylureas, unclear if incorrectly still taking this, history unreliable due to cognitive/memory deficits -possible that UTi contributing, very poor Po intake too -also need to r/o malignancy due to ongoing anorexia, 30lb weight loss in 3-22months -start with Ct abd pelvis, due to h/o distension   Possible UTI -rocephin, Fu Urine CX    Anemia of chronic disease -Hb higher than baseline due to dehydration, hemoconcentration    ESRD (end stage renal disease) -on HD TTS, has an HD  catheter -missed HD yesterday, K 5.6, not volume overloaded -Renal Dr.COlodonato notified    Congestive dilated cardiomyopathy-EF 40-45% at cath, 35% by echo -  continue coreg, volume managed with HD    Protein-calorie malnutrition, severe -Rd consult    Memory loss/Cognitive dysfunction -unsafe to life alone and manage medications, agreeable to seek help -needs to move in with family or SNF/ALF -check TSh and B12  DVT proph: SCDs  Code Status: Full Code, advised patient and niece to strongly reconsider this DVT Prophylaxis: SCDs Family Communication: niece at bedside Disposition Plan: will need SNF vs go home with family  Time spent:  [facility] Pager [phone]

## 2015-03-12 NOTE — ED Notes (Signed)
Rechecked CBG, 47.  Dr. Jodi Mourning informed.  Patient given coke and chocolate milk, some peanut butter.  Patient cannot eat solid foods bc she does not have her dentures.  Called to order ensure.

## 2015-03-12 NOTE — ED Notes (Signed)
Per EMS: states she was trying to go to [facility] at 0830, states she was walking to [facility], leg gave out fell and head hit a piece of furniture.  EMS checked cbg, it was 54, gave 24 grams oral glucose, rechecked pta and it was 60.  Nephew states that pt did not make it to dialysis treatment yesterday, unsure of exactly why.  Has been experiencing increased confusion since feb-march.  This morning she seemed more confused, speech was garbled.  Alert and oriented except for time.

## 2015-03-12 NOTE — Progress Notes (Signed)
Received report from ED.  Cypher Paule Eileen, RN. 

## 2015-03-12 NOTE — Care Management Note (Signed)
Case Management Note  Patient Details  Name: Natalie Wolfe MRN: [id number] Date of Birth: 1929-09-02  Subjective/Objective:                  States she was walking to br, leg gave out fell and head hit a piece of furniture.//Home alone.  Action/Plan: Access for disposition needs.  Expected Discharge Date:      08/032016            Expected Discharge Plan:  Home w Home Health Services  In-House Referral:  NA  Discharge planning Services  CM Consult  Post Acute Care Choice:  Home Health Choice offered to:     DME Arranged:    DME Agency:     HH Arranged:  RN, PT HH Agency:  [facility]  Status of Service:  Completed, signed off  Medicare Important Message Given:    Date Medicare IM Given:    Medicare IM give by:    Date Additional Medicare IM Given:    Additional Medicare Important Message give by:     If discussed at Long Length of Stay Meetings, dates discussed:    Additional Comments:   Orlyn Odonoghue J. Lucretia Roers, RN, BSN, [employer] [phone] Spoke with pt at bedside regarding discharge planning for [facility]. Offered pt list of home health agencies to choose from.  Pt chose [facility] to render services. Clydie Braun, RN of [facility] notified.  No DME needs identified at this time.  Nephew states that pt did not make it to dialysis treatment yesterday, unsure of exactly why. Has been experiencing increased confusion since feb-march. This morning she seemed more confused, speech was garbled. Alert and oriented except for time.  Oletta Cohn, RN 03/12/2015, 2:43 PM

## 2015-03-12 NOTE — Consult Note (Signed)
Indication for Consultation:  Management of ESRD/hemodialysis; anemia, hypertension/volume and secondary hyperparathyroidism  HPI: Natalie Wolfe is a 81 y.o. female who presented to the ED after sustaining a fall this AM. She recieves HD TTS at [facility], history of DM, CHF, failure to thrive and memory loss. She reports she hit her leg on on a chair and fell this am and hit her head. She denies any chest pain, lightheaded, dizziness or loss of consciousness. She lives alone and reports she has fallen a few times in the past few weeks. She also reports her blood sugar is sometimes low 'in the 80s' at home and she has poor po intake- she is only drinking ensure and hasn't had meal since she was last in the hospital. Per notes she has increased confusion as noted per family- she said she missed HD yesterday for an apt of her AVF though no notes in epic regarding [facility] appt. She was admitted for further eval. Will arrange HD while admitted.  Past Medical History  Diagnosis Date  . Urinary frequency   . Routine general medical examination at a health care facility   . Other atopic dermatitis and related conditions   . Rash and other nonspecific skin eruption   . Other anxiety states   . Insomnia, unspecified   . PVD (peripheral vascular disease)   . Unspecified vitamin D deficiency   . Anemia, unspecified   . Obesity, unspecified   . Nocturia   . Pain in joint, lower leg   . Other and unspecified hyperlipidemia   . Unspecified essential hypertension   . Cataract   . CHF (congestive heart failure)   . Thrombocytopenia   . Chronic kidney disease, stage II (mild)     [facility]  . Type II or unspecified type diabetes mellitus with renal manifestations, not stated as uncontrolled   . Memory loss 02/10/2015  . Dementia     forgetful, .  . Failure to thrive in adult 03/09/2015   Past Surgical History  Procedure Laterality Date  . Abdominal hysterectomy  1970  . Breast  surgery  1979    nodule removed  . Eye surgery  2004    catarcts removed from right eye.  . Multiple tooth extractions  09/2013    Remonve remaining 3 teeth, no with dentures   . Av fistula placement Left 09/06/2014    Procedure: CREATION OF LEFT UPPER ARM ARTERIOVENOUS (AV) FISTULA ;  Surgeon: Pryor Ochoa, MD;  Location: [facility] OR;  Service: Vascular;  Laterality: Left;  . Insertion of dialysis catheter N/A 09/06/2014    Procedure: INSERTION OF DIALYSIS CATHETER RIGHT INTERNAL JUGULAR VEIN;  Surgeon: Pryor Ochoa, MD;  Location: [facility] OR;  Service: Vascular;  Laterality: N/A;  . Ligation of competing branches of arteriovenous fistula Left 09/06/2014    Procedure: LIGATION OF COMPETING BRANCHES OF ARTERIOVENOUS FISTULA;  Surgeon: Pryor Ochoa, MD;  Location: [facility] OR;  Service: Vascular;  Laterality: Left;  . Left heart catheterization with coronary angiogram N/A 09/17/2014    Procedure: LEFT HEART CATHETERIZATION WITH CORONARY ANGIOGRAM;  Surgeon: Lennette Bihari, MD;  Location: [facility] CATH LAB;  Service: Cardiovascular;  Laterality: N/A;  . False aneurysm repair Left 03/07/2015    Procedure: REPAIR PSEUDOANEURYSMS OF LEFT ARM ARTERIOVENOUS FISTULA;  Surgeon: Chuck Hint, MD;  Location: [facility] OR;  Service: Vascular;  Laterality: Left;   Family History  Problem Relation Age of Onset  . Diabetes Mother    Social  History:  reports that she quit smoking about 14 years ago. Her smoking use included Cigarettes. She has never used smokeless tobacco. She reports that she does not drink alcohol or use illicit drugs. Allergies  Allergen Reactions  . Heparin Other (See Comments)    Positive Hep Induced Plt Ab and SRA 09/12/2014   Prior to Admission medications   Medication Sig Start Date End Date Taking? Authorizing Provider  acetaminophen (TYLENOL) 500 MG tablet Take 500 mg by mouth every 6 (six) hours as needed (for pain).    Yes Historical Provider, MD  amLODipine (NORVASC) 10 MG tablet Take 10 mg by  mouth at bedtime.   Yes Historical Provider, MD  carvedilol (COREG) 12.5 MG tablet One twice daily to strengthen the heart and control BP Patient taking differently: Take 12.5 mg by mouth 2 (two) times daily with a meal. One twice daily to strengthen the heart and control BP 02/12/15  Yes [name], MD  ENSURE (ENSURE) Take 237 mLs by mouth 3 (three) times daily between meals.   Yes Historical Provider, MD  fluocinonide cream (LIDEX) 0.05 % Apply 1 application topically 2 (two) times daily. Apply from neck down twice a day (provided by dermatology).   Yes Historical Provider, MD  hydrALAZINE (APRESOLINE) 25 MG tablet Take 1 tablet (25 mg total) by mouth every 8 (eight) hours. 11/07/14  Yes [name], MD  lidocaine-prilocaine (EMLA) cream Apply 1 application topically as needed (used on dialysis days).    Yes Historical Provider, MD  mirtazapine (REMERON) 15 MG tablet Take 15 mg by mouth at bedtime.   Yes Historical Provider, MD  sertraline (ZOLOFT) 50 MG tablet Take 1 tablet (50 mg total) by mouth daily. 02/12/15  Yes [name], MD  glucose blood (BAYER CONTOUR NEXT TEST) test strip Use as instructed 11/27/12   Edison Pace, RPH-CPP  oxyCODONE (ROXICODONE) 5 MG immediate release tablet Take 1 tablet (5 mg total) by mouth every 6 (six) hours as needed. Patient not taking: Reported on 03/12/2015 03/07/15   Ames Coupe Rhyne, PA-C   Current Facility-Administered Medications  Medication Dose Route Frequency Provider Last Rate Last Dose  . cefTRIAXone (ROCEPHIN) 1 g in dextrose 5 % 50 mL IVPB  1 g Intravenous Once Blane Ohara, MD 100 mL/hr at 03/12/15 1706 1 g at 03/12/15 1706  . feeding supplement (ENSURE ENLIVE) (ENSURE ENLIVE) liquid 237 mL  237 mL Oral BID BM Blane Ohara, MD   237 mL at 03/12/15 1618  . sodium polystyrene (KAYEXALATE) 15 GM/60ML suspension 15 g  15 g Oral Once Jetty Duhamel, NP       Current Outpatient Prescriptions  Medication Sig Dispense Refill  . acetaminophen  (TYLENOL) 500 MG tablet Take 500 mg by mouth every 6 (six) hours as needed (for pain).     Marland Kitchen amLODipine (NORVASC) 10 MG tablet Take 10 mg by mouth at bedtime.    . carvedilol (COREG) 12.5 MG tablet One twice daily to strengthen the heart and control BP (Patient taking differently: Take 12.5 mg by mouth 2 (two) times daily with a meal. One twice daily to strengthen the heart and control BP) 60 tablet 5  . ENSURE (ENSURE) Take 237 mLs by mouth 3 (three) times daily between meals.    . fluocinonide cream (LIDEX) 0.05 % Apply 1 application topically 2 (two) times daily. Apply from neck down twice a day (provided by dermatology).    . hydrALAZINE (APRESOLINE) 25 MG tablet Take 1 tablet (  25 mg total) by mouth every 8 (eight) hours. 270 tablet 3  . lidocaine-prilocaine (EMLA) cream Apply 1 application topically as needed (used on dialysis days).     . mirtazapine (REMERON) 15 MG tablet Take 15 mg by mouth at bedtime.    . sertraline (ZOLOFT) 50 MG tablet Take 1 tablet (50 mg total) by mouth daily. 30 tablet 3  . glucose blood (BAYER CONTOUR NEXT TEST) test strip Use as instructed 100 each 12  . oxyCODONE (ROXICODONE) 5 MG immediate release tablet Take 1 tablet (5 mg total) by mouth every 6 (six) hours as needed. (Patient not taking: Reported on 03/12/2015) 20 tablet 0   Labs: Basic Metabolic Panel:  Recent Labs Lab 03/10/15 0841 03/12/15 1252 03/12/15 1452  NA 131* 132* 129*  K 4.9 6.0* 5.6*  CL 85* 87* 95*  CO2 24 27  --   GLUCOSE 57* 34* 25*  BUN 64* 95* 87*  CREATININE 5.26* 7.75* 7.30*  CALCIUM 9.5 10.9*  --   ALB 3.3*  --   --    Liver Function Tests:  Recent Labs Lab 03/10/15 0841  AST 23  ALT 15  ALKPHOS 101  BILITOT <0.2  PROT 6.2   No results for input(s): LIPASE, AMYLASE in the last 168 hours. No results for input(s): AMMONIA in the last 168 hours. CBC:  Recent Labs Lab 03/07/15 0954 03/10/15 0841 03/12/15 1252 03/12/15 1452  WBC  --  6.6 6.0  --   NEUTROABS  --   4.0 4.5  --   HGB 12.9  --  13.8 16.3*  HCT 38.0 34.0 42.1 48.0*  MCV  --   --  90.5  --   PLT  --   --  276  --    Cardiac Enzymes: No results for input(s): CKTOTAL, CKMB, CKMBINDEX, TROPONINI in the last 168 hours. CBG:  Recent Labs Lab 03/07/15 1503 03/12/15 1238 03/12/15 1407 03/12/15 1538 03/12/15 1650  GLUCAP 82 57* 109* 47* 69   Iron Studies: No results for input(s): IRON, TIBC, TRANSFERRIN, FERRITIN in the last 72 hours. Studies/Results: Ct Head Wo Contrast  03/12/2015   CLINICAL DATA:  Fall, head injury, left frontal headache  EXAM: CT HEAD WITHOUT CONTRAST  CT CERVICAL SPINE WITHOUT CONTRAST  TECHNIQUE: Multidetector CT imaging of the head and cervical spine was performed following the standard protocol without intravenous contrast. Multiplanar CT image reconstructions of the cervical spine were also generated.  COMPARISON:  None.  FINDINGS: CT HEAD FINDINGS  No skull fracture is noted. No intracranial hemorrhage, mass effect or midline shift. No acute cortical infarction. No mass lesion is noted on this unenhanced scan. Mild cerebral atrophy. Mild periventricular white matter decreased attenuation probable due to chronic small vessel ischemic changes. There is small lacunar infarct in right cerebellum.  CT CERVICAL SPINE FINDINGS  Sagittal images of the cervical spine shows no acute fracture or subluxation. Axial images of the cervical spine shows no acute fracture or subluxation. There is no pneumothorax in visualized lung apices.  Degenerative changes are noted C1-C2 articulation. Mild disc space flattening with anterior spurring at C2-C3 level. Degenerative changes with disc space flattening and mild anterior spurring with endplate cystic changes noted at C3-C4-C4-C5 C5-C6 level. Moderate disc space flattening with mild anterior spurring at C6-C7 level. Moderate disc space flattening with anterior spurring at C7-T1 level. There is no prevertebral soft tissue swelling. Cervical  airway is patent.  IMPRESSION: 1. No acute intracranial abnormality. Mild cerebral atrophy. There is  small lacunar infarct in right cerebellum. 2. No cervical spine acute fracture or subluxation. Multilevel degenerative changes as described above.   Electronically Signed   By: Natasha Mead M.D.   On: 03/12/2015 14:56   Ct Cervical Spine Wo Contrast  03/12/2015   CLINICAL DATA:  Fall, head injury, left frontal headache  EXAM: CT HEAD WITHOUT CONTRAST  CT CERVICAL SPINE WITHOUT CONTRAST  TECHNIQUE: Multidetector CT imaging of the head and cervical spine was performed following the standard protocol without intravenous contrast. Multiplanar CT image reconstructions of the cervical spine were also generated.  COMPARISON:  None.  FINDINGS: CT HEAD FINDINGS  No skull fracture is noted. No intracranial hemorrhage, mass effect or midline shift. No acute cortical infarction. No mass lesion is noted on this unenhanced scan. Mild cerebral atrophy. Mild periventricular white matter decreased attenuation probable due to chronic small vessel ischemic changes. There is small lacunar infarct in right cerebellum.  CT CERVICAL SPINE FINDINGS  Sagittal images of the cervical spine shows no acute fracture or subluxation. Axial images of the cervical spine shows no acute fracture or subluxation. There is no pneumothorax in visualized lung apices.  Degenerative changes are noted C1-C2 articulation. Mild disc space flattening with anterior spurring at C2-C3 level. Degenerative changes with disc space flattening and mild anterior spurring with endplate cystic changes noted at C3-C4-C4-C5 C5-C6 level. Moderate disc space flattening with mild anterior spurring at C6-C7 level. Moderate disc space flattening with anterior spurring at C7-T1 level. There is no prevertebral soft tissue swelling. Cervical airway is patent.  IMPRESSION: 1. No acute intracranial abnormality. Mild cerebral atrophy. There is small lacunar infarct in right cerebellum.  2. No cervical spine acute fracture or subluxation. Multilevel degenerative changes as described above.   Electronically Signed   By: Natasha Mead M.D.   On: 03/12/2015 14:56    Review of Systems: Denies chest pain/lightheaded/dizzy Denies headache Reports DOE, denies sob or orthopnea. Denies cough Reports poor po intake. Denies n/v abd pain. No BM in a few days Denies urinary frequency/urgency Reports hypoglycemia at home- holds meds  Physical Exam: Filed Vitals:   03/12/15 1630 03/12/15 1645 03/12/15 1700 03/12/15 1703  BP: 190/61 182/57 182/58 182/57  Pulse: 72 66 65 66  Temp:      TempSrc:      Resp: 23 22 19 15   Height:      Weight:      SpO2: 100% 98% 98% 99%     General: thin, frail Head: R forehead laceration Neck: Supple. JVD not elevated. Lungs: Clear bilaterally to auscultation without wheezes, rales, or rhonchi. Breathing is unlabored. Heart: RRR with S1 S2. No murmurs, rubs, or gallops appreciated. Abdomen: Soft, non-tender, non-distended with normoactive bowel sounds. No rebound/guarding. No obvious abdominal masses. M-S:  Strength and tone appear normal for age. Lower extremities:without edema or ischemic changes, no open wounds  Neuro: Alert and oriented to person and place '1916' . Moves all extremities spontaneously. Psych:  Responds to questions appropriately with a normal affect. Dialysis Access: L AVf +b/t incision healing well.   Dialysis Orders: [facility] 3[facility] 30 mins    45.5kgs    2K/2.25ca   No heparin Micera 150 q 2 weeks- last given 7/26  Assessment/Plan: 1.  fall - head and cervical spine CT- no acute findings. ?r/t hypoglycemia- was 54 per EMS 2. Hypoglycemia- poor po intake 3. UTI- rocephin, culture pending 4.  ESRD -  TTS [facility]. Missed HD yesterday. HD pending tomorrow. K+6 recheck 5.6-  give kayexelate. Can use AVF 6 weeks from 7/29 5.  Hypertension/volume  - 182/57 no volume excess. Did not take BP meds today- home meds coreg, hydralazine,  isosorbide 6.  Anemia  - hgb 13.8. Watch CBC. Last tsat 17- start Fe bolus.  7.  Metabolic bone disease -  Last phos 3.6 and PTH 27- no binders or Vit D needed. 8.  Nutrition - poor intake, vitamin and nepro- renal diet.  9. Cardiomyopathy 10. Memory loss 11. dispo- currently lives alone- may need SNF placement  Jetty Duhamel, NP [facility] [phone] 03/12/2015, 5:15 PM

## 2015-03-13 ENCOUNTER — Encounter (HOSPITAL_COMMUNITY): Payer: Self-pay | Admitting: Radiology

## 2015-03-13 ENCOUNTER — Inpatient Hospital Stay (HOSPITAL_COMMUNITY): Payer: Medicare Other

## 2015-03-13 DIAGNOSIS — E1165 Type 2 diabetes mellitus with hyperglycemia: Secondary | ICD-10-CM

## 2015-03-13 DIAGNOSIS — N186 End stage renal disease: Secondary | ICD-10-CM

## 2015-03-13 DIAGNOSIS — E1129 Type 2 diabetes mellitus with other diabetic kidney complication: Secondary | ICD-10-CM

## 2015-03-13 DIAGNOSIS — E162 Hypoglycemia, unspecified: Secondary | ICD-10-CM

## 2015-03-13 LAB — COMPREHENSIVE METABOLIC PANEL
ALBUMIN: 2.2 g/dL — AB (ref 3.5–5.0)
ALK PHOS: 99 U/L (ref 38–126)
ALT: 26 U/L (ref 14–54)
AST: 29 U/L (ref 15–41)
Anion gap: 14 (ref 5–15)
BUN: 99 mg/dL — ABNORMAL HIGH (ref 6–20)
CALCIUM: 9.8 mg/dL (ref 8.9–10.3)
CO2: 27 mmol/L (ref 22–32)
CREATININE: 8.19 mg/dL — AB (ref 0.44–1.00)
Chloride: 84 mmol/L — ABNORMAL LOW (ref 101–111)
GFR calc non Af Amer: 4 mL/min — ABNORMAL LOW (ref >60)
GFR, EST AFRICAN AMERICAN: 5 mL/min — AB (ref >60)
Glucose, Bld: 78 mg/dL (ref 65–99)
Potassium: 6 mmol/L — ABNORMAL HIGH (ref 3.5–5.1)
Sodium: 125 mmol/L — ABNORMAL LOW (ref 135–145)
Total Bilirubin: 0.4 mg/dL (ref 0.3–1.2)
Total Protein: 6.3 g/dL — ABNORMAL LOW (ref 6.5–8.1)

## 2015-03-13 LAB — RENAL FUNCTION PANEL
Albumin: 2.1 g/dL — ABNORMAL LOW (ref 3.5–5.0)
Anion gap: 15 (ref 5–15)
BUN: 104 mg/dL — ABNORMAL HIGH (ref 6–20)
CO2: 27 mmol/L (ref 22–32)
Calcium: 9.8 mg/dL (ref 8.9–10.3)
Chloride: 85 mmol/L — ABNORMAL LOW (ref 101–111)
Creatinine, Ser: 8.17 mg/dL — ABNORMAL HIGH (ref 0.44–1.00)
GFR calc Af Amer: 5 mL/min — ABNORMAL LOW (ref >60)
GFR calc non Af Amer: 4 mL/min — ABNORMAL LOW (ref >60)
Glucose, Bld: 71 mg/dL (ref 65–99)
PHOSPHORUS: 3.9 mg/dL (ref 2.5–4.6)
Potassium: 6.5 mmol/L (ref 3.5–5.1)
Sodium: 127 mmol/L — ABNORMAL LOW (ref 135–145)

## 2015-03-13 LAB — GLUCOSE, CAPILLARY
GLUCOSE-CAPILLARY: 44 mg/dL — AB (ref 65–99)
GLUCOSE-CAPILLARY: 77 mg/dL (ref 65–99)
GLUCOSE-CAPILLARY: 102 mg/dL — AB (ref 65–99)
Glucose-Capillary: 51 mg/dL — ABNORMAL LOW (ref 65–99)
Glucose-Capillary: 74 mg/dL (ref 65–99)
Glucose-Capillary: 90 mg/dL (ref 65–99)
Glucose-Capillary: 97 mg/dL (ref 65–99)

## 2015-03-13 LAB — CBC
HCT: 31.9 % — ABNORMAL LOW (ref 36.0–46.0)
HEMATOCRIT: 31.9 % — AB (ref 36.0–46.0)
Hemoglobin: 10.5 g/dL — ABNORMAL LOW (ref 12.0–15.0)
Hemoglobin: 10.6 g/dL — ABNORMAL LOW (ref 12.0–15.0)
MCH: 29.3 pg (ref 26.0–34.0)
MCH: 29.8 pg (ref 26.0–34.0)
MCHC: 32.9 g/dL (ref 30.0–36.0)
MCHC: 33.2 g/dL (ref 30.0–36.0)
MCV: 89.1 fL (ref 78.0–100.0)
MCV: 89.6 fL (ref 78.0–100.0)
PLATELETS: 258 10*3/uL (ref 150–400)
Platelets: 279 10*3/uL (ref 150–400)
RBC: 3.58 MIL/uL — ABNORMAL LOW (ref 3.87–5.11)
RBC: 3.56 MIL/uL — ABNORMAL LOW (ref 3.87–5.11)
RDW: 16.1 % — AB (ref 11.5–15.5)
RDW: 16.2 % — ABNORMAL HIGH (ref 11.5–15.5)
WBC: 7.7 10*3/uL (ref 4.0–10.5)
WBC: 7.7 10*3/uL (ref 4.0–10.5)

## 2015-03-13 LAB — BASIC METABOLIC PANEL
ANION GAP: 9 (ref 5–15)
BUN: 20 mg/dL (ref 6–20)
CO2: 29 mmol/L (ref 22–32)
Calcium: 8.2 mg/dL — ABNORMAL LOW (ref 8.9–10.3)
Chloride: 96 mmol/L — ABNORMAL LOW (ref 101–111)
Creatinine, Ser: 3.33 mg/dL — ABNORMAL HIGH (ref 0.44–1.00)
GFR calc Af Amer: 14 mL/min — ABNORMAL LOW (ref >60)
GFR calc non Af Amer: 12 mL/min — ABNORMAL LOW (ref >60)
Glucose, Bld: 86 mg/dL (ref 65–99)
Potassium: 4.5 mmol/L (ref 3.5–5.1)
SODIUM: 134 mmol/L — AB (ref 135–145)

## 2015-03-13 LAB — HEMOGLOBIN A1C
Hgb A1c MFr Bld: 5.8 % — ABNORMAL HIGH (ref 4.8–5.6)
Mean Plasma Glucose: 120 mg/dL

## 2015-03-13 MED ORDER — HEPARIN SODIUM (PORCINE) 1000 UNIT/ML DIALYSIS: 1000.0000 [IU] | INTRAMUSCULAR | Status: DC | PRN

## 2015-03-13 MED ORDER — IOHEXOL 300 MG/ML  SOLN
80.0000 mL | Freq: Once | INTRAMUSCULAR | Status: AC | PRN
  Administered 2015-03-13: 100 mL via INTRAVENOUS

## 2015-03-13 MED ORDER — PENTAFLUOROPROP-TETRAFLUOROETH EX AERO: 1.0000 "application " | INHALATION_SPRAY | CUTANEOUS | Status: DC | PRN

## 2015-03-13 MED ORDER — SODIUM CHLORIDE 0.9 % IV SOLN: 100.0000 mL | INTRAVENOUS | Status: DC | PRN

## 2015-03-13 MED ORDER — NEPRO/CARBSTEADY PO LIQD: 237.0000 mL | ORAL | Status: DC | PRN

## 2015-03-13 MED ORDER — DEXTROSE 50 % IV SOLN
INTRAVENOUS | Status: AC
Start: 2015-03-13 — End: 2015-03-13
  Administered 2015-03-13: 50 mL
  Filled 2015-03-13: qty 50

## 2015-03-13 MED ORDER — ALTEPLASE 2 MG IJ SOLR
2.0000 mg | Freq: Once | INTRAMUSCULAR | Status: DC | PRN
  Filled 2015-03-13: qty 2

## 2015-03-13 MED ORDER — DEXTROSE 50 % IV SOLN: 50.0000 mL | Freq: Once | INTRAVENOUS | Status: AC

## 2015-03-13 MED ORDER — LIDOCAINE HCL (PF) 1 % IJ SOLN: 5.0000 mL | INTRAMUSCULAR | Status: DC | PRN

## 2015-03-13 MED ORDER — LIDOCAINE-PRILOCAINE 2.5-2.5 % EX CREA
1.0000 "application " | TOPICAL_CREAM | CUTANEOUS | Status: DC | PRN
  Filled 2015-03-13: qty 5

## 2015-03-13 NOTE — Progress Notes (Signed)
[facility] PROGRESS NOTE  Natalie Wolfe ZOX:[id number] DOB: November 04, 1929 DOA: 03/12/2015 PCP: [name], MD  Assessment/Plan: Active Problems:   Fall -presumed to be related to debility/hypoglycemia -no syncope   Hypoglycemia/DM type 2 -possible that UTi contributing, very poor Po intake too -continue nutritional supplementation  Presumed UTI -rocephin, Fu Urine CX   Anemia of chronic disease -Hb higher than baseline due to dehydration, hemoconcentration   ESRD (end stage renal disease) -on HD TTS, has an HD catheter -missed HD yesterday, K 5.6, not volume overloaded -nephrology on board   Congestive dilated cardiomyopathy-EF 40-45% at cath, 35% by echo -continue coreg, volume managed with HD   Protein-calorie malnutrition, severe -Rd consult, agree with their findings - continue traditional supplementation   Memory loss/Cognitive dysfunction -unsafe to life alone and manage medications -needs to move in with family or SNF/ALF  Code Status: full Family Communication: None at bedside Disposition Plan: -most likely to SNF   Consultants:  nephrology  Procedures:  none  Antibiotics:  ceftriaxone  HPI/Subjective: Patient has no new complaints. No acute issues overnight  Objective: Filed Vitals:   03/13/15 1630  BP: 159/55  Pulse: 70  Temp: 98.7 F (37.1 C)  Resp: 18    Intake/Output Summary (Last 24 hours) at 03/13/15 1757 Last data filed at 03/13/15 1100  Gross per 24 hour  Intake      0 ml  Output    991 ml  Net   -991 ml   Filed Weights   03/12/15 2003 03/13/15 0723 03/13/15 1000  Weight: 45.949 kg (101 lb 4.8 oz) 50.2 kg (110 lb 10.7 oz) 49.1 kg (108 lb 3.9 oz)    Exam:   General:  Patient in no acute distress, alert and awake  Cardiovascular: regular rate and rhythm, no murmurs or rubs  Respiratory: clear to auscultation bilaterally, no wheezes  Abdomen: soft, nondistended, nontender, no  guarding  Musculoskeletal: equal tone bilaterally   Data Reviewed: Basic Metabolic Panel:  Recent Labs Lab 03/10/15 0841 03/12/15 1252 03/12/15 1452 03/13/15 0410 03/13/15 0749 03/13/15 1500  NA 131* 132* 129* 125* 127* 134*  K 4.9 6.0* 5.6* 6.0* 6.5* 4.5  CL 85* 87* 95* 84* 85* 96*  CO2 24 27  --  GLUCOSE 57* 34* 25* 78 71 86  BUN 64* 95* 87* 99* 104* 20  CREATININE 5.26* 7.75* 7.30* 8.19* 8.17* 3.33*  CALCIUM 9.5 10.9*  --  9.8 9.8 8.2*  PHOS  --   --   --   --  3.9  --    Liver Function Tests:  Recent Labs Lab 03/10/15 0841 03/13/15 0410 03/13/15 0749  AST 23 29  --   ALT 15 26  --   ALKPHOS 101 99  --   BILITOT <0.2 0.4  --   PROT 6.2 6.3*  --   ALBUMIN  --  2.2* 2.1*   No results for input(s): LIPASE, AMYLASE in the last 168 hours. No results for input(s): AMMONIA in the last 168 hours. CBC:  Recent Labs Lab 03/07/15 0954 03/10/15 0841 03/12/15 1252 03/12/15 1452 03/13/15 0410 03/13/15 0750  WBC  --  6.6 6.0  --  7.7 7.7  NEUTROABS  --  4.0 4.5  --   --   --   HGB 12.9  --  13.8 16.3* 10.5* 10.6*  HCT 38.0 34.0 42.1 48.0* 31.9* 31.9*  MCV  --   --  90.5  --  89.1 89.6  PLT  --   --  276  --  279 258   Cardiac Enzymes: No results for input(s): CKTOTAL, CKMB, CKMBINDEX, TROPONINI in the last 168 hours. BNP (last 3 results) No results for input(s): BNP in the last 8760 hours.  ProBNP (last 3 results) No results for input(s): PROBNP in the last 8760 hours.  CBG:  Recent Labs Lab 03/13/15 0122 03/13/15 0154 03/13/15 0413 03/13/15 0614 03/13/15 1629  GLUCAP 44* 97 74 77 90    Recent Results (from the past 240 hour(s))  Urine culture     Status: None (Preliminary result)   Collection Time: 03/12/15  3:06 PM  Result Value Ref Range Status   Specimen Description URINE, RANDOM  Final   Special Requests NONE  Final   Culture CULTURE REINCUBATED FOR BETTER GROWTH  Final   Report Status PENDING  Incomplete     Studies: Ct Head  Wo Contrast  03/12/2015   CLINICAL DATA:  Fall, head injury, left frontal headache  EXAM: CT HEAD WITHOUT CONTRAST  CT CERVICAL SPINE WITHOUT CONTRAST  TECHNIQUE: Multidetector CT imaging of the head and cervical spine was performed following the standard protocol without intravenous contrast. Multiplanar CT image reconstructions of the cervical spine were also generated.  COMPARISON:  None.  FINDINGS: CT HEAD FINDINGS  No skull fracture is noted. No intracranial hemorrhage, mass effect or midline shift. No acute cortical infarction. No mass lesion is noted on this unenhanced scan. Mild cerebral atrophy. Mild periventricular white matter decreased attenuation probable due to chronic small vessel ischemic changes. There is small lacunar infarct in right cerebellum.  CT CERVICAL SPINE FINDINGS  Sagittal images of the cervical spine shows no acute fracture or subluxation. Axial images of the cervical spine shows no acute fracture or subluxation. There is no pneumothorax in visualized lung apices.  Degenerative changes are noted C1-C2 articulation. Mild disc space flattening with anterior spurring at C2-C3 level. Degenerative changes with disc space flattening and mild anterior spurring with endplate cystic changes noted at C3-C4-C4-C5 C5-C6 level. Moderate disc space flattening with mild anterior spurring at C6-C7 level. Moderate disc space flattening with anterior spurring at C7-T1 level. There is no prevertebral soft tissue swelling. Cervical airway is patent.  IMPRESSION: 1. No acute intracranial abnormality. Mild cerebral atrophy. There is small lacunar infarct in right cerebellum. 2. No cervical spine acute fracture or subluxation. Multilevel degenerative changes as described above.   Electronically Signed   By: Natasha Mead M.D.   On: 03/12/2015 14:56   Ct Cervical Spine Wo Contrast  03/12/2015   CLINICAL DATA:  Fall, head injury, left frontal headache  EXAM: CT HEAD WITHOUT CONTRAST  CT CERVICAL SPINE WITHOUT  CONTRAST  TECHNIQUE: Multidetector CT imaging of the head and cervical spine was performed following the standard protocol without intravenous contrast. Multiplanar CT image reconstructions of the cervical spine were also generated.  COMPARISON:  None.  FINDINGS: CT HEAD FINDINGS  No skull fracture is noted. No intracranial hemorrhage, mass effect or midline shift. No acute cortical infarction. No mass lesion is noted on this unenhanced scan. Mild cerebral atrophy. Mild periventricular white matter decreased attenuation probable due to chronic small vessel ischemic changes. There is small lacunar infarct in right cerebellum.  CT CERVICAL SPINE FINDINGS  Sagittal images of the cervical spine shows no acute fracture or subluxation. Axial images of the cervical spine shows no acute fracture or subluxation. There is no pneumothorax in visualized lung apices.  Degenerative changes are noted C1-C2 articulation. Mild disc space flattening with anterior spurring  at C2-C3 level. Degenerative changes with disc space flattening and mild anterior spurring with endplate cystic changes noted at C3-C4-C4-C5 C5-C6 level. Moderate disc space flattening with mild anterior spurring at C6-C7 level. Moderate disc space flattening with anterior spurring at C7-T1 level. There is no prevertebral soft tissue swelling. Cervical airway is patent.  IMPRESSION: 1. No acute intracranial abnormality. Mild cerebral atrophy. There is small lacunar infarct in right cerebellum. 2. No cervical spine acute fracture or subluxation. Multilevel degenerative changes as described above.   Electronically Signed   By: Natasha Mead M.D.   On: 03/12/2015 14:56   Ct Abdomen Pelvis W Contrast  03/13/2015   CLINICAL DATA:  Fall. Weight loss. Failure to thrive. Chronic renal insufficiency.  EXAM: CT ABDOMEN AND PELVIS WITH CONTRAST  TECHNIQUE: Multidetector CT imaging of the abdomen and pelvis was performed using the standard protocol following bolus administration  of intravenous contrast.  CONTRAST:  OMNIPAQUE IOHEXOL 300 MG/ML  SOLN  COMPARISON:  Renal ultrasound 08/31/2014  FINDINGS: Bilateral pleural effusions are noted, left greater than right. There is associated airspace disease likely reflecting atelectasis.  The heart is mildly enlarged.  The liver and spleen are within normal limits. The stomach, duodenum, and pancreas are unremarkable. The common bile duct and gallbladder are within normal limits. Adrenal glands are normal bilaterally. The kidneys are atrophic. A 2.5 cm cyst is present at the lower pole of the left kidney.  Diverticular changes are present throughout the sigmoid colon. No focal inflammation is evident to suggest diverticulitis. The more proximal colon is within normal limits. Contrast can be seen into the transverse colon. The appendix is not discretely visualized and may be surgically absent. The small bowel is within normal limits.  Dense atherosclerotic calcifications are present within the abdominal aorta. Irregular focal aneurysmal dilation is present with a transverse diameter of 3.2 cm at the level of the renal arteries. There is also a left anterior diverticulum or ulceration just below the renal arteries. No other aneurysmal dilation is present.  Slight degenerative retrolisthesis is present at L1-2  Diffuse soft tissue edema is present.  Hysterectomy is noted.  IMPRESSION: 1. At bilateral pleural effusions and associated atelectasis. 2. Atherosclerotic changes of the aorta with focal aneurysmal dilation or ulcerations as described. 3. Diffuse soft tissue edema suggesting anasarca. 4. Sigmoid diverticulosis without diverticulitis. 5. Bilateral renal atrophy with cystic change.   Electronically Signed   By: Marin Roberts M.D.   On: 03/13/2015 07:47    Scheduled Meds: . amLODipine  10 mg Oral QHS  . carvedilol  12.5 mg Oral BID WC  . cefTRIAXone (ROCEPHIN)  IV  1 g Intravenous Q24H  . feeding supplement (ENSURE ENLIVE)   237 mL Oral BID BM  . feeding supplement (NEPRO CARB STEADY)  237 mL Oral BID BM  . ferric gluconate (FERRLECIT/NULECIT) IV  125 mg Intravenous Q T,Th,Sa-HD  . hydrALAZINE  25 mg Oral 3 times per day  . mirtazapine  15 mg Oral QHS  . multivitamin  1 tablet Oral QHS  . sertraline  50 mg Oral Daily  . sodium polystyrene  15 g Oral Once   Continuous Infusions:    Time spent: > 35 minutes    Penny Pia  [facility] Pager [phone]. If 7PM-7AM, please contact night-coverage at [email or web address], password [facility] 03/13/2015, 5:57 PM  LOS: 1 day

## 2015-03-13 NOTE — Progress Notes (Signed)
Noted initial lab glucose 34 mg/dl on 02/14/28 and according to H&P patient has been experiencing hypoglycemia. In reviewing the chart, noted Dr. Thomasene Lot office note dated 03/05/2015 in which Amaryl 1 mg QAM was discontinued due to suspicion that syncope was due to hypoglycemia. Question if patient has continued to take Amaryl since patient has continued to experience hypoglycemia since being admitted. Glucose is stable at this time; will continue to follow. Thanks, Orlando Penner, RN, MSN, CCRN, CDE Diabetes Coordinator Inpatient Diabetes Program [phone] (Team Pager from 8am to 5pm) [phone] ([facility] office) [phone] [facility] office) [phone] [facility] office)

## 2015-03-13 NOTE — Progress Notes (Signed)
This encounter was created in error - please disregard.  This encounter was created in error - please disregard.

## 2015-03-13 NOTE — Significant Event (Signed)
CBG: 44  Treatment: D50 IV 25 mL  Symptoms: None  Follow-up CBG: Time:0155 CBG Result:97  Possible Reasons for Event: Inadequate meal intake   Laverda Sorenson RN

## 2015-03-13 NOTE — Procedures (Signed)
Patient was seen on dialysis and the procedure was supervised. BFR 400 Via RIJ TDC BP is 143/61.  Patient appears to be tolerating treatment well.

## 2015-03-13 NOTE — Significant Event (Addendum)
CBG: 51  Treatment: 15 GM carbohydrate snack  Symptoms: None  Follow-up CBG: Time:0122 CBG Result:44  Possible Reasons for Event: Inadequate meal intake   Laverda Sorenson RN

## 2015-03-14 DIAGNOSIS — E43 Unspecified severe protein-calorie malnutrition: Secondary | ICD-10-CM

## 2015-03-14 LAB — BASIC METABOLIC PANEL
Anion gap: 11 (ref 5–15)
BUN: 29 mg/dL — AB (ref 6–20)
CO2: 28 mmol/L (ref 22–32)
CREATININE: 4.53 mg/dL — AB (ref 0.44–1.00)
Calcium: 8.6 mg/dL — ABNORMAL LOW (ref 8.9–10.3)
Chloride: 93 mmol/L — ABNORMAL LOW (ref 101–111)
GFR calc non Af Amer: 8 mL/min — ABNORMAL LOW (ref >60)
GFR, EST AFRICAN AMERICAN: 9 mL/min — AB (ref >60)
GLUCOSE: 105 mg/dL — AB (ref 65–99)
POTASSIUM: 4.9 mmol/L (ref 3.5–5.1)
SODIUM: 132 mmol/L — AB (ref 135–145)

## 2015-03-14 LAB — GLUCOSE, CAPILLARY
GLUCOSE-CAPILLARY: 124 mg/dL — AB (ref 65–99)
GLUCOSE-CAPILLARY: 224 mg/dL — AB (ref 65–99)
Glucose-Capillary: 121 mg/dL — ABNORMAL HIGH (ref 65–99)
Glucose-Capillary: 162 mg/dL — ABNORMAL HIGH (ref 65–99)
Glucose-Capillary: 179 mg/dL — ABNORMAL HIGH (ref 65–99)
Glucose-Capillary: 77 mg/dL (ref 65–99)

## 2015-03-14 MED ORDER — NA FERRIC GLUC CPLX IN SUCROSE 12.5 MG/ML IV SOLN
125.0000 mg | INTRAVENOUS | Status: DC
  Administered 2015-03-15: 125 mg via INTRAVENOUS
  Filled 2015-03-14: qty 10

## 2015-03-14 NOTE — Care Management Important Message (Signed)
Important Message  Patient Details  Name: Natalie Wolfe MRN: [id number] Date of Birth: 22-Nov-1929   Medicare Important Message Given:  Yes-second notification given    Orson Aloe 03/14/2015, 2:04 PM

## 2015-03-14 NOTE — Progress Notes (Signed)
[facility] Progress Note  Assessment/Plan: 1. Fall - head and cervical spine CT- no acute findings. ?r/t hypoglycemia- was 54 per EMS 2. Hypoglycemia- poor po intake 3. UTI- rocephin, culture pending 4. ESRD /hyperkalemia due to missed HD- TTS [facility]. Can use AVF 6 weeks from 7/29- She has consistent ^ Ks of 6 at her HD unit and has kt/v's of 2 or more - should increase time to 4 hours at d/c and consider change to 1 K bath if she goes home.  She drinks a lot of Ensure and Nepro at home. ^ K related to Ensure intake has been an issue in the past and she has repeatedly been counseled by the RD at the dialysis center.  However, Ensure is considerably less expensive which is part of why she drinks it. 5. Hypertension/volume - BP 140 - 150s - net UF 991 8/4 -weights significantly above EDW - NEED standing weights; she consistently gets to edw of 45.5 with average UF goals of 1 - 2.5 L 6. Anemia - hgb 13.8. Watch CBC. Last tsat 17 up from 9% and did not get IV Fe -ferritin 900s - last outpt Hgb 11.5 had been trending up; IV Fe started here - limit to 3  doses 7. Metabolic bone disease - Last phos 3.6 and PTH 27- no binders or Vit D needed. 8. Nutrition - poor intake, vitamin and nepro- renal diet.-Has Ensure and Nepro at bedside  9. Cardiomyopathy EF 35% 09/2014 by echo - 40 - 45% cath 10. Memory loss 11. Dispo- currently lives alone- may need SNF vs ALF placement - she thinks "they" are getting someone to help her at home. Encouraged her to consider going for Rehab for a while to build up strength. At the least she needs a homehealth RN  Sheffield Slider, PA-C [facility] Beeper [phone] 03/14/2015,9:08 AM  LOS: 2 days   Subjective:   No c.o. Thinks she falls because he knees give out.  Objective Filed Vitals:   03/13/15 1210 03/13/15 1630 03/13/15 2021 03/14/15 0430  BP: 149/55 159/55 149/54 148/54  Pulse: 73 70 71 74  Temp: 99 F (37.2 C) 98.7 F (37.1 C)  98.7 F (37.1 C) 99.5 F (37.5 C)  TempSrc: Oral Oral Oral Oral  Resp: 19 18 19 18   Height:      Weight:   51.1 kg (112 lb 10.5 oz)   SpO2: 95% 98% 97% 94%   Physical Exam General: NAD Heart: RRR Lungs: no rales Abdomen: soft Extremities: no sig edema Dialysis Access: left AVF + bruit and right IJ  Dialysis Orders:[facility] [place] 3hr 30 mins 45.5kgs 2K/2.25ca No heparin Mircera 150 q 2 weeks- last given 7/26   Additional Objective Labs: Basic Metabolic Panel:  Recent Labs Lab 03/10/15 0841  03/13/15 0749 03/13/15 1500 03/14/15 0522  NA 131*  < > 127* 134* 132*  K 4.9  < > 6.5* 4.5 4.9  CL 85*  < > 85* 96* 93*  CO2 24  < > 27 29 28   GLUCOSE 57*  < > 71 86 105*  BUN 64*  < > 104* 20 29*  CREATININE 5.26*  < > 8.17* 3.33* 4.53*  CALCIUM 9.5  < > 9.8 8.2* 8.6*  ALB 3.3*  --   --   --   --   PHOS  --   --  3.9  --   --   < > = values in this interval not displayed. Liver Function  Tests:  Recent Labs Lab 03/10/15 0841 03/13/15 0410 03/13/15 0749  AST 23 29  --   ALT 15 26  --   ALKPHOS 101 99  --   BILITOT <0.2 0.4  --   PROT 6.2 6.3*  --   ALBUMIN  --  2.2* 2.1*   CBC:  Recent Labs Lab 03/10/15 0841 03/12/15 1252 03/12/15 1452 03/13/15 0410 03/13/15 0750  WBC 6.6 6.0  --  7.7 7.7  NEUTROABS 4.0 4.5  --   --   --   HGB  --  13.8 16.3* 10.5* 10.6*  HCT 34.0 42.1 48.0* 31.9* 31.9*  MCV  --  90.5  --  89.1 89.6  PLT  --  276  --  279 258   Blood Culture    Component Value Date/Time   SDES URINE, RANDOM 03/12/2015 1506   SPECREQUEST NONE 03/12/2015 1506   CULT CULTURE REINCUBATED FOR BETTER GROWTH 03/12/2015 1506   REPTSTATUS PENDING 03/12/2015 1506    CBG:  Recent Labs Lab 03/13/15 1629 03/13/15 2019 03/14/15 0015 03/14/15 0338 03/14/15 0739  GLUCAP 90 102* 77 124* 121*    Studies/Results: Ct Head Wo Contrast  03/12/2015   CLINICAL DATA:  Fall, head injury, left frontal headache  EXAM: CT HEAD WITHOUT CONTRAST  CT CERVICAL SPINE  WITHOUT CONTRAST  TECHNIQUE: Multidetector CT imaging of the head and cervical spine was performed following the standard protocol without intravenous contrast. Multiplanar CT image reconstructions of the cervical spine were also generated.  COMPARISON:  None.  FINDINGS: CT HEAD FINDINGS  No skull fracture is noted. No intracranial hemorrhage, mass effect or midline shift. No acute cortical infarction. No mass lesion is noted on this unenhanced scan. Mild cerebral atrophy. Mild periventricular white matter decreased attenuation probable due to chronic small vessel ischemic changes. There is small lacunar infarct in right cerebellum.  CT CERVICAL SPINE FINDINGS  Sagittal images of the cervical spine shows no acute fracture or subluxation. Axial images of the cervical spine shows no acute fracture or subluxation. There is no pneumothorax in visualized lung apices.  Degenerative changes are noted C1-C2 articulation. Mild disc space flattening with anterior spurring at C2-C3 level. Degenerative changes with disc space flattening and mild anterior spurring with endplate cystic changes noted at C3-C4-C4-C5 C5-C6 level. Moderate disc space flattening with mild anterior spurring at C6-C7 level. Moderate disc space flattening with anterior spurring at C7-T1 level. There is no prevertebral soft tissue swelling. Cervical airway is patent.  IMPRESSION: 1. No acute intracranial abnormality. Mild cerebral atrophy. There is small lacunar infarct in right cerebellum. 2. No cervical spine acute fracture or subluxation. Multilevel degenerative changes as described above.   Electronically Signed   By: Natasha Mead M.D.   On: 03/12/2015 14:56   Ct Cervical Spine Wo Contrast  03/12/2015   CLINICAL DATA:  Fall, head injury, left frontal headache  EXAM: CT HEAD WITHOUT CONTRAST  CT CERVICAL SPINE WITHOUT CONTRAST  TECHNIQUE: Multidetector CT imaging of the head and cervical spine was performed following the standard protocol without  intravenous contrast. Multiplanar CT image reconstructions of the cervical spine were also generated.  COMPARISON:  None.  FINDINGS: CT HEAD FINDINGS  No skull fracture is noted. No intracranial hemorrhage, mass effect or midline shift. No acute cortical infarction. No mass lesion is noted on this unenhanced scan. Mild cerebral atrophy. Mild periventricular white matter decreased attenuation probable due to chronic small vessel ischemic changes. There is small lacunar infarct in right cerebellum.  CT CERVICAL SPINE FINDINGS  Sagittal images of the cervical spine shows no acute fracture or subluxation. Axial images of the cervical spine shows no acute fracture or subluxation. There is no pneumothorax in visualized lung apices.  Degenerative changes are noted C1-C2 articulation. Mild disc space flattening with anterior spurring at C2-C3 level. Degenerative changes with disc space flattening and mild anterior spurring with endplate cystic changes noted at C3-C4-C4-C5 C5-C6 level. Moderate disc space flattening with mild anterior spurring at C6-C7 level. Moderate disc space flattening with anterior spurring at C7-T1 level. There is no prevertebral soft tissue swelling. Cervical airway is patent.  IMPRESSION: 1. No acute intracranial abnormality. Mild cerebral atrophy. There is small lacunar infarct in right cerebellum. 2. No cervical spine acute fracture or subluxation. Multilevel degenerative changes as described above.   Electronically Signed   By: Natasha Mead M.D.   On: 03/12/2015 14:56   Ct Abdomen Pelvis W Contrast  03/13/2015   CLINICAL DATA:  Fall. Weight loss. Failure to thrive. Chronic renal insufficiency.  EXAM: CT ABDOMEN AND PELVIS WITH CONTRAST  TECHNIQUE: Multidetector CT imaging of the abdomen and pelvis was performed using the standard protocol following bolus administration of intravenous contrast.  CONTRAST:  OMNIPAQUE IOHEXOL 300 MG/ML  SOLN  COMPARISON:  Renal ultrasound 08/31/2014  FINDINGS:  Bilateral pleural effusions are noted, left greater than right. There is associated airspace disease likely reflecting atelectasis.  The heart is mildly enlarged.  The liver and spleen are within normal limits. The stomach, duodenum, and pancreas are unremarkable. The common bile duct and gallbladder are within normal limits. Adrenal glands are normal bilaterally. The kidneys are atrophic. A 2.5 cm cyst is present at the lower pole of the left kidney.  Diverticular changes are present throughout the sigmoid colon. No focal inflammation is evident to suggest diverticulitis. The more proximal colon is within normal limits. Contrast can be seen into the transverse colon. The appendix is not discretely visualized and may be surgically absent. The small bowel is within normal limits.  Dense atherosclerotic calcifications are present within the abdominal aorta. Irregular focal aneurysmal dilation is present with a transverse diameter of 3.2 cm at the level of the renal arteries. There is also a left anterior diverticulum or ulceration just below the renal arteries. No other aneurysmal dilation is present.  Slight degenerative retrolisthesis is present at L1-2  Diffuse soft tissue edema is present.  Hysterectomy is noted.  IMPRESSION: 1. At bilateral pleural effusions and associated atelectasis. 2. Atherosclerotic changes of the aorta with focal aneurysmal dilation or ulcerations as described. 3. Diffuse soft tissue edema suggesting anasarca. 4. Sigmoid diverticulosis without diverticulitis. 5. Bilateral renal atrophy with cystic change.   Electronically Signed   By: Marin Roberts M.D.   On: 03/13/2015 07:47   Medications:   . amLODipine  10 mg Oral QHS  . carvedilol  12.5 mg Oral BID WC  . cefTRIAXone (ROCEPHIN)  IV  1 g Intravenous Q24H  . feeding supplement (ENSURE ENLIVE)  237 mL Oral BID BM  . feeding supplement (NEPRO CARB STEADY)  237 mL Oral BID BM  . ferric gluconate (FERRLECIT/NULECIT) IV  125 mg  Intravenous Q T,Th,Sa-HD  . hydrALAZINE  25 mg Oral 3 times per day  . mirtazapine  15 mg Oral QHS  . multivitamin  1 tablet Oral QHS  . sertraline  50 mg Oral Daily    I have seen and examined this patient and agree with plan and assessment with emphasis on  highlighted areas as noted. Hypoglycemia has resolved, eating some (says no appetite for 2 months or more and "lives on Ensure", agree she would benefit either from some rehab or minimally a HHRN/cbd . Corran Lalone B,MD 03/14/2015 1:36 PM

## 2015-03-14 NOTE — Evaluation (Signed)
Physical Therapy Evaluation Patient Details Name: Natalie Wolfe MRN: [id number] DOB: 1929/11/24 Today's Date: 03/14/2015   History of Present Illness  Pt is an 81 year old female admitted for fall, hypoglycemia, ESRD /hyperkalemia due to missed HD- TTS [facility] with hx of dementia, PVD, DM II, CHF.  Clinical Impression  Pt admitted with above diagnosis. Pt currently with functional limitations due to the deficits listed below (see PT Problem List).  Pt will benefit from skilled PT to increase their independence and safety with mobility to allow discharge to the venue listed below.  Pt able to ambulate good distance today however does reports hx of falls at home.  Pt strongly encouraged to use assistive device for safety as she reports a recent fall because she left her SPC in another room.  Pt would benefit from HHPT and possible increase in home care - aide if possible.     Follow Up Recommendations Home health PT;Supervision - Intermittent    Equipment Recommendations  None recommended by PT    Recommendations for Other Services       Precautions / Restrictions Precautions Precautions: Fall      Mobility  Bed Mobility Overal bed mobility: Modified Independent                Transfers Overall transfer level: Needs assistance Equipment used: None Transfers: Sit to/from Stand Sit to Stand: Min guard         General transfer comment: min/guard for safety  Ambulation/Gait Ambulation/Gait assistance: Min guard Ambulation Distance (Feet): 200 Feet Assistive device: None Gait Pattern/deviations: Step-through pattern;Decreased stride length;Narrow base of support     General Gait Details: a little unsteady however not using assistive device as pt wished to try without, pt reports R knee effecting gait, discussed using SPC or RW upon d/c for safety  Stairs            Wheelchair Mobility    Modified Rankin (Stroke Patients Only)       Balance Overall  balance assessment: History of Falls                                           Pertinent Vitals/Pain Pain Assessment: No/denies pain    Home Living Family/patient expects to be discharged to:: Private residence Living Arrangements: Alone Available Help at Discharge: Family;Available PRN/intermittently Type of Home: Apartment Home Access: Level entry     Home Layout: One level Home Equipment: Walker - 2 wheels;Cane - single point      Prior Function Level of Independence: Independent with assistive device(s)         Comments: pt states she typically uses SPC inside and RW otherwise but with recent falls (states she was not using assistive device for a recent fall)     Hand Dominance        Extremity/Trunk Assessment               Lower Extremity Assessment: Generalized weakness         Communication   Communication: No difficulties  Cognition Arousal/Alertness: Awake/alert Behavior During Therapy: WFL for tasks assessed/performed Overall Cognitive Status: No family/caregiver present to determine baseline cognitive functioning (appropriate, hx of memory loss and dementia)                      General Comments      Exercises  Assessment/Plan    PT Assessment Patient needs continued PT services  PT Diagnosis Generalized weakness   PT Problem List Decreased strength;Decreased balance;Decreased mobility;Decreased knowledge of use of DME  PT Treatment Interventions DME instruction;Gait training;Functional mobility training;Patient/family education;Therapeutic activities;Therapeutic exercise;Balance training   PT Goals (Current goals can be found in the Care Plan section) Acute Rehab PT Goals PT Goal Formulation: With patient Time For Goal Achievement: 03/21/15 Potential to Achieve Goals: Good    Frequency Min 3X/week   Barriers to discharge        Co-evaluation               End of Session Equipment  Utilized During Treatment: Gait belt Activity Tolerance: Patient tolerated treatment well Patient left: in chair;with call bell/phone within reach           Time: 1610-9604 PT Time Calculation (min) (ACUTE ONLY): 17 min   Charges:   PT Evaluation $Initial PT Evaluation Tier I: 1 Procedure     PT G Codes:        Natalie Wolfe,Natalie Wolfe 03/14/2015, 3:03 PM Zenovia Jarred, PT, DPT 03/14/2015 Pager: [phone]

## 2015-03-14 NOTE — Progress Notes (Signed)
[facility] PROGRESS NOTE  Natalie Wolfe ZOX:[id number] DOB: November 10, 1929 DOA: 03/12/2015 PCP: [name], MD  Assessment/Plan: Active Problems:   Fall -presumed to be related to debility/hypoglycemia -no syncope - We'll obtain physical therapy evaluation   Hypoglycemia/DM type 2 -Most likely secondary to very poor oral intake. This has resolved and patient is currently taking nutritional supplementation. As such hypoglycemia has resolved  Presumed UTI -rocephin, Fu Urine CX   Anemia of chronic disease -Hb higher than baseline due to dehydration, hemoconcentration   ESRD (end stage renal disease) -on HD TTS, has an HD catheter -nephrology on board   Congestive dilated cardiomyopathy-EF 40-45% at cath, 35% by echo -continue coreg, volume managed with HD   Protein-calorie malnutrition, severe -Rd consult, agree with their findings - continue nutritional supplementation   Memory loss/Cognitive dysfunction -unsafe to life alone and manage medications -needs to move in with family or SNF/ALF  Code Status: full Family Communication: None at bedside Disposition Plan: -most likely to SNF, but will have physical therapy evaluate   Consultants:  nephrology  Procedures:  none  Antibiotics:  ceftriaxone  HPI/Subjective: Patient reports feeling much better today.  Objective: Filed Vitals:   03/14/15 0913  BP: 154/67  Pulse: 75  Temp: 97.9 F (36.6 C)  Resp: 18    Intake/Output Summary (Last 24 hours) at 03/14/15 1300 Last data filed at 03/14/15 0900  Gross per 24 hour  Intake    290 ml  Output      0 ml  Net    290 ml   Filed Weights   03/13/15 0723 03/13/15 1000 03/13/15 2021  Weight: 50.2 kg (110 lb 10.7 oz) 49.1 kg (108 lb 3.9 oz) 51.1 kg (112 lb 10.5 oz)    Exam:   General:  Patient in no acute distress, alert and awake  Cardiovascular: regular rate and rhythm, no murmurs or rubs  Respiratory: clear to auscultation  bilaterally, no wheezes  Abdomen: soft, nondistended, nontender, no guarding  Musculoskeletal: equal tone bilaterally   Data Reviewed: Basic Metabolic Panel:  Recent Labs Lab 03/12/15 1252 03/12/15 1452 03/13/15 0410 03/13/15 0749 03/13/15 1500 03/14/15 0522  NA 132* 129* 125* 127* 134* 132*  K 6.0* 5.6* 6.0* 6.5* 4.5 4.9  CL 87* 95* 84* 85* 96* 93*  CO2 27  --  GLUCOSE 34* 25* 78 71 86 105*  BUN 95* 87* 99* 104* 20 29*  CREATININE 7.75* 7.30* 8.19* 8.17* 3.33* 4.53*  CALCIUM 10.9*  --  9.8 9.8 8.2* 8.6*  PHOS  --   --   --  3.9  --   --    Liver Function Tests:  Recent Labs Lab 03/10/15 0841 03/13/15 0410 03/13/15 0749  AST 23 29  --   ALT 15 26  --   ALKPHOS 101 99  --   BILITOT <0.2 0.4  --   PROT 6.2 6.3*  --   ALBUMIN  --  2.2* 2.1*   No results for input(s): LIPASE, AMYLASE in the last 168 hours. No results for input(s): AMMONIA in the last 168 hours. CBC:  Recent Labs Lab 03/10/15 0841 03/12/15 1252 03/12/15 1452 03/13/15 0410 03/13/15 0750  WBC 6.6 6.0  --  7.7 7.7  NEUTROABS 4.0 4.5  --   --   --   HGB  --  13.8 16.3* 10.5* 10.6*  HCT 34.0 42.1 48.0* 31.9* 31.9*  MCV  --  90.5  --  89.1 89.6  PLT  --  276  --  279 258   Cardiac Enzymes: No results for input(s): CKTOTAL, CKMB, CKMBINDEX, TROPONINI in the last 168 hours. BNP (last 3 results) No results for input(s): BNP in the last 8760 hours.  ProBNP (last 3 results) No results for input(s): PROBNP in the last 8760 hours.  CBG:  Recent Labs Lab 03/13/15 2019 03/14/15 0015 03/14/15 0338 03/14/15 0739 03/14/15 1159  GLUCAP 102* 77 124* 121* 179*    Recent Results (from the past 240 hour(s))  Urine culture     Status: None (Preliminary result)   Collection Time: 03/12/15  3:06 PM  Result Value Ref Range Status   Specimen Description URINE, RANDOM  Final   Special Requests NONE  Final   Culture >=100,000 COLONIES/mL GRAM NEGATIVE RODS  Final   Report Status PENDING   Incomplete     Studies: Ct Head Wo Contrast  03/12/2015   CLINICAL DATA:  Fall, head injury, left frontal headache  EXAM: CT HEAD WITHOUT CONTRAST  CT CERVICAL SPINE WITHOUT CONTRAST  TECHNIQUE: Multidetector CT imaging of the head and cervical spine was performed following the standard protocol without intravenous contrast. Multiplanar CT image reconstructions of the cervical spine were also generated.  COMPARISON:  None.  FINDINGS: CT HEAD FINDINGS  No skull fracture is noted. No intracranial hemorrhage, mass effect or midline shift. No acute cortical infarction. No mass lesion is noted on this unenhanced scan. Mild cerebral atrophy. Mild periventricular white matter decreased attenuation probable due to chronic small vessel ischemic changes. There is small lacunar infarct in right cerebellum.  CT CERVICAL SPINE FINDINGS  Sagittal images of the cervical spine shows no acute fracture or subluxation. Axial images of the cervical spine shows no acute fracture or subluxation. There is no pneumothorax in visualized lung apices.  Degenerative changes are noted C1-C2 articulation. Mild disc space flattening with anterior spurring at C2-C3 level. Degenerative changes with disc space flattening and mild anterior spurring with endplate cystic changes noted at C3-C4-C4-C5 C5-C6 level. Moderate disc space flattening with mild anterior spurring at C6-C7 level. Moderate disc space flattening with anterior spurring at C7-T1 level. There is no prevertebral soft tissue swelling. Cervical airway is patent.  IMPRESSION: 1. No acute intracranial abnormality. Mild cerebral atrophy. There is small lacunar infarct in right cerebellum. 2. No cervical spine acute fracture or subluxation. Multilevel degenerative changes as described above.   Electronically Signed   By: Natasha Mead M.D.   On: 03/12/2015 14:56   Ct Cervical Spine Wo Contrast  03/12/2015   CLINICAL DATA:  Fall, head injury, left frontal headache  EXAM: CT HEAD WITHOUT  CONTRAST  CT CERVICAL SPINE WITHOUT CONTRAST  TECHNIQUE: Multidetector CT imaging of the head and cervical spine was performed following the standard protocol without intravenous contrast. Multiplanar CT image reconstructions of the cervical spine were also generated.  COMPARISON:  None.  FINDINGS: CT HEAD FINDINGS  No skull fracture is noted. No intracranial hemorrhage, mass effect or midline shift. No acute cortical infarction. No mass lesion is noted on this unenhanced scan. Mild cerebral atrophy. Mild periventricular white matter decreased attenuation probable due to chronic small vessel ischemic changes. There is small lacunar infarct in right cerebellum.  CT CERVICAL SPINE FINDINGS  Sagittal images of the cervical spine shows no acute fracture or subluxation. Axial images of the cervical spine shows no acute fracture or subluxation. There is no pneumothorax in visualized lung apices.  Degenerative changes are noted C1-C2 articulation. Mild disc space flattening with anterior spurring  at C2-C3 level. Degenerative changes with disc space flattening and mild anterior spurring with endplate cystic changes noted at C3-C4-C4-C5 C5-C6 level. Moderate disc space flattening with mild anterior spurring at C6-C7 level. Moderate disc space flattening with anterior spurring at C7-T1 level. There is no prevertebral soft tissue swelling. Cervical airway is patent.  IMPRESSION: 1. No acute intracranial abnormality. Mild cerebral atrophy. There is small lacunar infarct in right cerebellum. 2. No cervical spine acute fracture or subluxation. Multilevel degenerative changes as described above.   Electronically Signed   By: Natasha Mead M.D.   On: 03/12/2015 14:56   Ct Abdomen Pelvis W Contrast  03/13/2015   CLINICAL DATA:  Fall. Weight loss. Failure to thrive. Chronic renal insufficiency.  EXAM: CT ABDOMEN AND PELVIS WITH CONTRAST  TECHNIQUE: Multidetector CT imaging of the abdomen and pelvis was performed using the standard  protocol following bolus administration of intravenous contrast.  CONTRAST:  OMNIPAQUE IOHEXOL 300 MG/ML  SOLN  COMPARISON:  Renal ultrasound 08/31/2014  FINDINGS: Bilateral pleural effusions are noted, left greater than right. There is associated airspace disease likely reflecting atelectasis.  The heart is mildly enlarged.  The liver and spleen are within normal limits. The stomach, duodenum, and pancreas are unremarkable. The common bile duct and gallbladder are within normal limits. Adrenal glands are normal bilaterally. The kidneys are atrophic. A 2.5 cm cyst is present at the lower pole of the left kidney.  Diverticular changes are present throughout the sigmoid colon. No focal inflammation is evident to suggest diverticulitis. The more proximal colon is within normal limits. Contrast can be seen into the transverse colon. The appendix is not discretely visualized and may be surgically absent. The small bowel is within normal limits.  Dense atherosclerotic calcifications are present within the abdominal aorta. Irregular focal aneurysmal dilation is present with a transverse diameter of 3.2 cm at the level of the renal arteries. There is also a left anterior diverticulum or ulceration just below the renal arteries. No other aneurysmal dilation is present.  Slight degenerative retrolisthesis is present at L1-2  Diffuse soft tissue edema is present.  Hysterectomy is noted.  IMPRESSION: 1. At bilateral pleural effusions and associated atelectasis. 2. Atherosclerotic changes of the aorta with focal aneurysmal dilation or ulcerations as described. 3. Diffuse soft tissue edema suggesting anasarca. 4. Sigmoid diverticulosis without diverticulitis. 5. Bilateral renal atrophy with cystic change.   Electronically Signed   By: Marin Roberts M.D.   On: 03/13/2015 07:47    Scheduled Meds: . amLODipine  10 mg Oral QHS  . carvedilol  12.5 mg Oral BID WC  . cefTRIAXone (ROCEPHIN)  IV  1 g Intravenous Q24H   . feeding supplement (ENSURE ENLIVE)  237 mL Oral BID BM  . feeding supplement (NEPRO CARB STEADY)  237 mL Oral BID BM  . [START ON 03/15/2015] ferric gluconate (FERRLECIT/NULECIT) IV  125 mg Intravenous Q T,Th,Sa-HD  . hydrALAZINE  25 mg Oral 3 times per day  . mirtazapine  15 mg Oral QHS  . multivitamin  1 tablet Oral QHS  . sertraline  50 mg Oral Daily   Continuous Infusions:    Time spent: > 35 minutes    Penny Pia  [facility] Pager [phone]. If 7PM-7AM, please contact night-coverage at [email or web address], password [facility] 03/14/2015, 1:00 PM  LOS: 2 days

## 2015-03-14 NOTE — Care Management Note (Signed)
Case Management Note  Patient Details  Name: Jeannie Mallinger MRN: [id number] Date of Birth: 05/26/1930  Subjective/Objective:            CM following for progression and d/c planning.        Action/Plan: 03/14/2015 Noted plan now for short term SNF, await PT eval , pt is malnurished, needs assistance with ADL and medication management. CM will follow for possible changes in d/c plan. Will d/c plan for [facility] is pt is placed in SNF.   Expected Discharge Date:  03/17/2015               Expected Discharge Plan:  Skilled Nursing Facility  In-House Referral:  NA, Clinical Social Work  Discharge planning Services  CM Consult  Post Acute Care Choice:    Choice offered to:     DME Arranged:    DME Agency:     HH Arranged:    HH Agency:     Status of Service:  Completed, signed off  Medicare Important Message Given:    Date Medicare IM Given:    Medicare IM give by:    Date Additional Medicare IM Given:    Additional Medicare Important Message give by:     If discussed at Long Length of Stay Meetings, dates discussed:    Additional Comments:  Starlyn Skeans, RN 03/14/2015, 10:27 AM

## 2015-03-15 LAB — CBC
HCT: 34.1 % — ABNORMAL LOW (ref 36.0–46.0)
Hemoglobin: 10.8 g/dL — ABNORMAL LOW (ref 12.0–15.0)
MCH: 28.5 pg (ref 26.0–34.0)
MCHC: 31.7 g/dL (ref 30.0–36.0)
MCV: 90 fL (ref 78.0–100.0)
Platelets: 253 10*3/uL (ref 150–400)
RBC: 3.79 MIL/uL — ABNORMAL LOW (ref 3.87–5.11)
RDW: 16 % — ABNORMAL HIGH (ref 11.5–15.5)
WBC: 6.3 10*3/uL (ref 4.0–10.5)

## 2015-03-15 LAB — GLUCOSE, CAPILLARY
GLUCOSE-CAPILLARY: 124 mg/dL — AB (ref 65–99)
GLUCOSE-CAPILLARY: 137 mg/dL — AB (ref 65–99)
Glucose-Capillary: 129 mg/dL — ABNORMAL HIGH (ref 65–99)
Glucose-Capillary: 118 mg/dL — ABNORMAL HIGH (ref 65–99)

## 2015-03-15 LAB — URINE CULTURE

## 2015-03-15 LAB — RENAL FUNCTION PANEL
Albumin: 2.3 g/dL — ABNORMAL LOW (ref 3.5–5.0)
Anion gap: 13 (ref 5–15)
BUN: 51 mg/dL — ABNORMAL HIGH (ref 6–20)
CO2: 25 mmol/L (ref 22–32)
Calcium: 8.7 mg/dL — ABNORMAL LOW (ref 8.9–10.3)
Chloride: 89 mmol/L — ABNORMAL LOW (ref 101–111)
Creatinine, Ser: 6.23 mg/dL — ABNORMAL HIGH (ref 0.44–1.00)
GFR calc Af Amer: 6 mL/min — ABNORMAL LOW (ref >60)
GFR calc non Af Amer: 6 mL/min — ABNORMAL LOW (ref >60)
Glucose, Bld: 176 mg/dL — ABNORMAL HIGH (ref 65–99)
Phosphorus: 4.2 mg/dL (ref 2.5–4.6)
Potassium: 4.8 mmol/L (ref 3.5–5.1)
Sodium: 127 mmol/L — ABNORMAL LOW (ref 135–145)

## 2015-03-15 MED ORDER — ALTEPLASE 2 MG IJ SOLR
2.0000 mg | Freq: Once | INTRAMUSCULAR | Status: DC | PRN
  Filled 2015-03-15: qty 2

## 2015-03-15 MED ORDER — RENA-VITE PO TABS: 1.0000 | ORAL_TABLET | Freq: Every day | ORAL | Status: AC

## 2015-03-15 MED ORDER — SODIUM CHLORIDE 0.9 % IV SOLN: 100.0000 mL | INTRAVENOUS | Status: DC | PRN

## 2015-03-15 MED ORDER — NEPRO/CARBSTEADY PO LIQD: 237.0000 mL | ORAL | Status: DC | PRN

## 2015-03-15 MED ORDER — LIDOCAINE HCL (PF) 1 % IJ SOLN: 5.0000 mL | INTRAMUSCULAR | Status: DC | PRN

## 2015-03-15 MED ORDER — PENTAFLUOROPROP-TETRAFLUOROETH EX AERO: 1.0000 "application " | INHALATION_SPRAY | CUTANEOUS | Status: DC | PRN

## 2015-03-15 MED ORDER — LIDOCAINE-PRILOCAINE 2.5-2.5 % EX CREA: 1.0000 "application " | TOPICAL_CREAM | CUTANEOUS | Status: DC | PRN

## 2015-03-15 NOTE — Procedures (Signed)
I have personally attended this patient's dialysis session.   TDC 400  OLC green Pre HD standing weight 50.5 Will use post standing weight as new EDW No lab today K yesterday 4.9 2K bath  Camille Bal, MD [facility] [phone] Pager 03/15/2015, 10:44 AM

## 2015-03-15 NOTE — Progress Notes (Signed)
Discharge instructions and medications discussed with patient.  Prescriptions given to patient.  All questions answered.  

## 2015-03-15 NOTE — Progress Notes (Signed)
Subjective:   Feels well, no complaints this AM. Says her family and neighbors check on her every day at home- no interested in rehab/SNF.  Objective Filed Vitals:   03/14/15 1406 03/14/15 1613 03/14/15 2057 03/15/15 0538  BP: 146/55 156/57 156/65 157/59  Pulse:  77 73 72  Temp:  98.7 F (37.1 C) 98.4 F (36.9 C) 98.3 F (36.8 C)  TempSrc:  Oral Oral Oral  Resp:  Height:      Weight:   52 kg (114 lb 10.2 oz)   SpO2:  97% 96% 94%   Physical Exam General: resting in bed, no acute distress Heart: RRR  Lungs: CTA, unlabored Abdomen: soft, nontender +BS  Extremities: no edema  Dialysis Access:  L AVF +b/t - resting for 6 weeks from 7/29    R IJ cath   Dialysis Orders:[facility] 3[facility] 30 mins 45.5kgs 2K/2.25ca No heparin Mircera 150 q 2 weeks- last given 7/26  Assessment/Plan: 1. Fall - head and cervical spine CT- no acute findings. ?r/t hypoglycemia- was 54 per EMS 2. Hypoglycemia- poor po intake 3. UTI- rocephin, culture + klebsiella 4. ESRD /hyperkalemia due to missed HD- TTS [facility]- HD pending today. Can use AVF 6 weeks from 7/29- She has consistent ^ Ks of 6 at her HD unit and has kt/v's of 2 or more - should increase time to 4 hours at d/c and consider change to 1 K bath if she goes home. She drinks a lot of Ensure and Nepro at home. ^ K related to Ensure intake has been an issue in the past and she has repeatedly been counseled by the RD at the dialysis center. However, Ensure is considerably less expensive which is part of why she drinks it. 5. Hypertension/volume - BP 140 - 150s - net UF 991 8/4 -weights significantly above EDW - NEED standing weights; she consistently gets to edw of 45.5 with average UF goals of 1 - 2.5 L Standing weight 50.5 pre hD will use post HD weight as new EDW. 6. Anemia - hgb 10.6. Watch CBC. Last tsat 17 up from 9% and did not get IV Fe -ferritin 900s - last outpt Hgb 11.5 had been trending up; IV Fe started here - limit to 3  doses 7. Metabolic bone disease - Last phos 3.9 and PTH 27- no binders or Vit D needed. 8. Nutrition - alb 2.1- says appetite is improving. poor intake, vitamin and nepro- renal diet.-Has Ensure and Nepro at bedside  9. Cardiomyopathy EF 35% 09/2014 by echo - 40 - 45% cath 10. Memory loss 11. Dispo- currently lives alone- may need SNF vs ALF placement - says she has help at home- not interested in rehab Plan is for discharge to home with home health  Jetty Duhamel, NP [facility] [phone] 03/15/2015,10:20 AM  LOS: 3 days    Additional Objective Labs: Basic Metabolic Panel:  Recent Labs Lab 03/10/15 0841  03/13/15 0749 03/13/15 1500 03/14/15 0522  NA 131*  < > 127* 134* 132*  K 4.9  < > 6.5* 4.5 4.9  CL 85*  < > 85* 96* 93*  CO2 24  < > GLUCOSE 57*  < > 71 86 105*  BUN 64*  < > 104* 20 29*  CREATININE 5.26*  < > 8.17* 3.33* 4.53*  CALCIUM 9.5  < > 9.8 8.2* 8.6*  ALB 3.3*  --   --   --   --  PHOS  --   --  3.9  --   --   < > = values in this interval not displayed. Liver Function Tests:  Recent Labs Lab 03/10/15 0841 03/13/15 0410 03/13/15 0749  AST 23 29  --   ALT 15 26  --   ALKPHOS 101 99  --   BILITOT <0.2 0.4  --   PROT 6.2 6.3*  --   ALBUMIN  --  2.2* 2.1*  CBC:  Recent Labs Lab 03/10/15 0841  03/12/15 1252 03/12/15 1452 03/13/15 0410 03/13/15 0750  WBC 6.6  --  6.0  --  7.7 7.7  NEUTROABS 4.0  --  4.5  --   --   --   HGB  --   < > 13.8 16.3* 10.5* 10.6*  HCT 34.0  < > 42.1 48.0* 31.9* 31.9*  MCV  --   --  90.5  --  89.1 89.6  PLT  --   --  276  --  279 258  < > = values in this interval not displayed.     Component Value Date/Time   SDES URINE, RANDOM 03/12/2015 1506   SPECREQUEST NONE 03/12/2015 1506   CULT >=100,000 COLONIES/mL KLEBSIELLA PNEUMONIAE 03/12/2015 1506   REPTSTATUS 03/15/2015 FINAL 03/12/2015 1506   CBG:  Recent Labs Lab 03/14/15 1611 03/14/15 1948 03/15/15 03/15/15 0406 03/15/15 0737   GLUCAP 224* 162* 137* 129* 118*    Studies/Results: No results found. Medications:   . amLODipine  10 mg Oral QHS  . carvedilol  12.5 mg Oral BID WC  . feeding supplement (NEPRO CARB STEADY)  237 mL Oral BID BM  . ferric gluconate (FERRLECIT/NULECIT) IV  125 mg Intravenous Q T,Th,Sa-HD  . hydrALAZINE  25 mg Oral 3 times per day  . mirtazapine  15 mg Oral QHS  . multivitamin  1 tablet Oral QHS  . sertraline  50 mg Oral Daily   I have seen and examined this patient and agree with plan and assessment in the above note of BWhelan with highlighted additions. Will  need adjustment in outpt EDW.  Will use today's post weight as her new dry.  Appears plan is for discharge with Home Health. Getting dialysis right now. [facility] setting up home health for pt.  Charonda Hefter B,MD 03/15/2015 10:40 AM

## 2015-03-15 NOTE — Discharge Summary (Signed)
Physician Discharge Summary  Natalie Wolfe:[id number] DOB: 17-May-1930 DOA: 03/12/2015  PCP: [name], MD  Admit date: 03/12/2015 Discharge date: 03/15/2015  Time spent: > 35 minutes  Recommendations for Outpatient Follow-up:  1. Patient will need physical therapy at home. I have filled out home health orders. Will also try to obtain home health aide for patient once she transitions home  Discharge Diagnoses:  Active Problems:   DM type 2, uncontrolled, with renal complications   Anemia of chronic disease   ESRD (end stage renal disease)   Congestive dilated cardiomyopathy-EF 40-45% at cath, 35% by echo   Protein-calorie malnutrition, severe   Memory loss   Hypoglycemia   Discharge Condition: Stable  Diet recommendation: Renal diet with fluid restriction and with nutritional supplementation  Filed Weights   03/13/15 1000 03/13/15 2021 03/14/15 2057  Weight: 49.1 kg (108 lb 3.9 oz) 51.1 kg (112 lb 10.5 oz) 52 kg (114 lb 10.2 oz)    History of present illness:  81 y.o. female with past medical history of memory loss/cognitive deficits, ESRD on HD Tuesday Thursday Saturday, diabetes mellitus now off medications recently by her PCP last week, severe malnutrition, chronic systolic CHF, hypertension presents to the ER with low blood sugar and recent fall   Hospital Course:  Fall -Multifactorial maybe secondary to debility from poor oral intake.  - Evaluated by physical therapy while in house who recommended home health PT and home health aide. Orders have been filled out -Report of CT of abdomen and pelvis reviewed and negative other than findings consistent with sigmoid diverticulosis, bilateral renal atrophy with cystic change, atherosclerotic changes of the aorta, bilateral pleural effusions. CT of head reported no acute intracranial abnormality and CT of cervical spine reported no cervical spine acute fracture or subluxation   Malnutrition -Recommended and  encouraged patient to take her nutritional supplements. She understands and verbally agrees  Hypertension -Continue home medication regimen   Procedures:  Please see above  Consultations:  Physical therapy  Discharge Exam: Filed Vitals:   03/15/15 0538  BP: 157/59  Pulse: 72  Temp: 98.3 F (36.8 C)  Resp: 24    General: Patient in no acute distress, alert and awake Cardiovascular: Regular rhythm and rate, no rubs Respiratory: No increased work of breathing, equal chest rise, no wheezes  Discharge Instructions   Discharge Instructions    Call MD for:  difficulty breathing, headache or visual disturbances    Complete by:  As directed      Call MD for:  severe uncontrolled pain    Complete by:  As directed      Call MD for:  temperature >100.4    Complete by:  As directed      Diet - low sodium heart healthy    Complete by:  As directed      Discharge instructions    Complete by:  As directed   Please follow up with your primary care physician in 1-2 weeks or sooner should any new concerns arise.     Increase activity slowly    Complete by:  As directed           Current Discharge Medication List    START taking these medications   Details  multivitamin (RENA-VIT) TABS tablet Take 1 tablet by mouth at bedtime. Qty: 30 each, Refills: 0    !! Nutritional Supplements (FEEDING SUPPLEMENT, NEPRO CARB STEADY,) LIQD Take 237 mLs by mouth as needed (missed meal during dialysis.). Qty: 28 Can,  Refills: 0     !! - Potential duplicate medications found. Please discuss with provider.    CONTINUE these medications which have NOT CHANGED   Details  acetaminophen (TYLENOL) 500 MG tablet Take 500 mg by mouth every 6 (six) hours as needed (for pain).     amLODipine (NORVASC) 10 MG tablet Take 10 mg by mouth at bedtime.    carvedilol (COREG) 12.5 MG tablet One twice daily to strengthen the heart and control BP Qty: 60 tablet, Refills: 5   Associated Diagnoses:  Congestive dilated cardiomyopathy    !! ENSURE (ENSURE) Take 237 mLs by mouth 3 (three) times daily between meals.    fluocinonide cream (LIDEX) 0.05 % Apply 1 application topically 2 (two) times daily. Apply from neck down twice a day (provided by dermatology).    hydrALAZINE (APRESOLINE) 25 MG tablet Take 1 tablet (25 mg total) by mouth every 8 (eight) hours. Qty: 270 tablet, Refills: 3   Associated Diagnoses: Essential hypertension    lidocaine-prilocaine (EMLA) cream Apply 1 application topically as needed (used on dialysis days).     mirtazapine (REMERON) 15 MG tablet Take 15 mg by mouth at bedtime.    sertraline (ZOLOFT) 50 MG tablet Take 1 tablet (50 mg total) by mouth daily. Qty: 30 tablet, Refills: 3   Associated Diagnoses: Depression    glucose blood (BAYER CONTOUR NEXT TEST) test strip Use as instructed Qty: 100 each, Refills: 12   Associated Diagnoses: Type II or unspecified type diabetes mellitus with renal manifestations, not stated as uncontrolled     !! - Potential duplicate medications found. Please discuss with provider.    STOP taking these medications     oxyCODONE (ROXICODONE) 5 MG immediate release tablet        Allergies  Allergen Reactions  . Heparin Other (See Comments)    Positive Hep Induced Plt Ab and SRA 09/12/2014      The results of significant diagnostics from this hospitalization (including imaging, microbiology, ancillary and laboratory) are listed below for reference.    Significant Diagnostic Studies: Ct Head Wo Contrast  03/12/2015   CLINICAL DATA:  Fall, head injury, left frontal headache  EXAM: CT HEAD WITHOUT CONTRAST  CT CERVICAL SPINE WITHOUT CONTRAST  TECHNIQUE: Multidetector CT imaging of the head and cervical spine was performed following the standard protocol without intravenous contrast. Multiplanar CT image reconstructions of the cervical spine were also generated.  COMPARISON:  None.  FINDINGS: CT HEAD FINDINGS  No skull fracture  is noted. No intracranial hemorrhage, mass effect or midline shift. No acute cortical infarction. No mass lesion is noted on this unenhanced scan. Mild cerebral atrophy. Mild periventricular white matter decreased attenuation probable due to chronic small vessel ischemic changes. There is small lacunar infarct in right cerebellum.  CT CERVICAL SPINE FINDINGS  Sagittal images of the cervical spine shows no acute fracture or subluxation. Axial images of the cervical spine shows no acute fracture or subluxation. There is no pneumothorax in visualized lung apices.  Degenerative changes are noted C1-C2 articulation. Mild disc space flattening with anterior spurring at C2-C3 level. Degenerative changes with disc space flattening and mild anterior spurring with endplate cystic changes noted at C3-C4-C4-C5 C5-C6 level. Moderate disc space flattening with mild anterior spurring at C6-C7 level. Moderate disc space flattening with anterior spurring at C7-T1 level. There is no prevertebral soft tissue swelling. Cervical airway is patent.  IMPRESSION: 1. No acute intracranial abnormality. Mild cerebral atrophy. There is small lacunar infarct in right  cerebellum. 2. No cervical spine acute fracture or subluxation. Multilevel degenerative changes as described above.   Electronically Signed   By: Natasha Mead M.D.   On: 03/12/2015 14:56   Ct Cervical Spine Wo Contrast  03/12/2015   CLINICAL DATA:  Fall, head injury, left frontal headache  EXAM: CT HEAD WITHOUT CONTRAST  CT CERVICAL SPINE WITHOUT CONTRAST  TECHNIQUE: Multidetector CT imaging of the head and cervical spine was performed following the standard protocol without intravenous contrast. Multiplanar CT image reconstructions of the cervical spine were also generated.  COMPARISON:  None.  FINDINGS: CT HEAD FINDINGS  No skull fracture is noted. No intracranial hemorrhage, mass effect or midline shift. No acute cortical infarction. No mass lesion is noted on this unenhanced  scan. Mild cerebral atrophy. Mild periventricular white matter decreased attenuation probable due to chronic small vessel ischemic changes. There is small lacunar infarct in right cerebellum.  CT CERVICAL SPINE FINDINGS  Sagittal images of the cervical spine shows no acute fracture or subluxation. Axial images of the cervical spine shows no acute fracture or subluxation. There is no pneumothorax in visualized lung apices.  Degenerative changes are noted C1-C2 articulation. Mild disc space flattening with anterior spurring at C2-C3 level. Degenerative changes with disc space flattening and mild anterior spurring with endplate cystic changes noted at C3-C4-C4-C5 C5-C6 level. Moderate disc space flattening with mild anterior spurring at C6-C7 level. Moderate disc space flattening with anterior spurring at C7-T1 level. There is no prevertebral soft tissue swelling. Cervical airway is patent.  IMPRESSION: 1. No acute intracranial abnormality. Mild cerebral atrophy. There is small lacunar infarct in right cerebellum. 2. No cervical spine acute fracture or subluxation. Multilevel degenerative changes as described above.   Electronically Signed   By: Natasha Mead M.D.   On: 03/12/2015 14:56   Ct Abdomen Pelvis W Contrast  03/13/2015   CLINICAL DATA:  Fall. Weight loss. Failure to thrive. Chronic renal insufficiency.  EXAM: CT ABDOMEN AND PELVIS WITH CONTRAST  TECHNIQUE: Multidetector CT imaging of the abdomen and pelvis was performed using the standard protocol following bolus administration of intravenous contrast.  CONTRAST:  OMNIPAQUE IOHEXOL 300 MG/ML  SOLN  COMPARISON:  Renal ultrasound 08/31/2014  FINDINGS: Bilateral pleural effusions are noted, left greater than right. There is associated airspace disease likely reflecting atelectasis.  The heart is mildly enlarged.  The liver and spleen are within normal limits. The stomach, duodenum, and pancreas are unremarkable. The common bile duct and gallbladder are  within normal limits. Adrenal glands are normal bilaterally. The kidneys are atrophic. A 2.5 cm cyst is present at the lower pole of the left kidney.  Diverticular changes are present throughout the sigmoid colon. No focal inflammation is evident to suggest diverticulitis. The more proximal colon is within normal limits. Contrast can be seen into the transverse colon. The appendix is not discretely visualized and may be surgically absent. The small bowel is within normal limits.  Dense atherosclerotic calcifications are present within the abdominal aorta. Irregular focal aneurysmal dilation is present with a transverse diameter of 3.2 cm at the level of the renal arteries. There is also a left anterior diverticulum or ulceration just below the renal arteries. No other aneurysmal dilation is present.  Slight degenerative retrolisthesis is present at L1-2  Diffuse soft tissue edema is present.  Hysterectomy is noted.  IMPRESSION: 1. At bilateral pleural effusions and associated atelectasis. 2. Atherosclerotic changes of the aorta with focal aneurysmal dilation or ulcerations as described. 3. Diffuse  soft tissue edema suggesting anasarca. 4. Sigmoid diverticulosis without diverticulitis. 5. Bilateral renal atrophy with cystic change.   Electronically Signed   By: Marin Roberts M.D.   On: 03/13/2015 07:47    Microbiology: Recent Results (from the past 240 hour(s))  Urine culture     Status: None   Collection Time: 03/12/15  3:06 PM  Result Value Ref Range Status   Specimen Description URINE, RANDOM  Final   Special Requests NONE  Final   Culture >=100,000 COLONIES/mL KLEBSIELLA PNEUMONIAE  Final   Report Status 03/15/2015 FINAL  Final   Organism ID, Bacteria KLEBSIELLA PNEUMONIAE  Final      Susceptibility   Klebsiella pneumoniae - MIC*    AMPICILLIN 16 RESISTANT Resistant     CEFAZOLIN <=4 SENSITIVE Sensitive     CEFTRIAXONE <=1 SENSITIVE Sensitive     CIPROFLOXACIN <=0.25 SENSITIVE Sensitive      GENTAMICIN <=1 SENSITIVE Sensitive     IMIPENEM <=0.25 SENSITIVE Sensitive     NITROFURANTOIN 64 INTERMEDIATE Intermediate     TRIMETH/SULFA <=20 SENSITIVE Sensitive     AMPICILLIN/SULBACTAM 4 SENSITIVE Sensitive     PIP/TAZO <=4 SENSITIVE Sensitive     * >=100,000 COLONIES/mL KLEBSIELLA PNEUMONIAE     Labs: Basic Metabolic Panel:  Recent Labs Lab 03/12/15 1252 03/12/15 1452 03/13/15 0410 03/13/15 0749 03/13/15 1500 03/14/15 0522  NA 132* 129* 125* 127* 134* 132*  K 6.0* 5.6* 6.0* 6.5* 4.5 4.9  CL 87* 95* 84* 85* 96* 93*  CO2 27  --  GLUCOSE 34* 25* 78 71 86 105*  BUN 95* 87* 99* 104* 20 29*  CREATININE 7.75* 7.30* 8.19* 8.17* 3.33* 4.53*  CALCIUM 10.9*  --  9.8 9.8 8.2* 8.6*  PHOS  --   --   --  3.9  --   --    Liver Function Tests:  Recent Labs Lab 03/10/15 0841 03/13/15 0410 03/13/15 0749  AST 23 29  --   ALT 15 26  --   ALKPHOS 101 99  --   BILITOT <0.2 0.4  --   PROT 6.2 6.3*  --   ALBUMIN  --  2.2* 2.1*   No results for input(s): LIPASE, AMYLASE in the last 168 hours. No results for input(s): AMMONIA in the last 168 hours. CBC:  Recent Labs Lab 03/10/15 0841 03/12/15 1252 03/12/15 1452 03/13/15 0410 03/13/15 0750  WBC 6.6 6.0  --  7.7 7.7  NEUTROABS 4.0 4.5  --   --   --   HGB  --  13.8 16.3* 10.5* 10.6*  HCT 34.0 42.1 48.0* 31.9* 31.9*  MCV  --  90.5  --  89.1 89.6  PLT  --  276  --  279 258   Cardiac Enzymes: No results for input(s): CKTOTAL, CKMB, CKMBINDEX, TROPONINI in the last 168 hours. BNP: BNP (last 3 results) No results for input(s): BNP in the last 8760 hours.  ProBNP (last 3 results) No results for input(s): PROBNP in the last 8760 hours.  CBG:  Recent Labs Lab 03/14/15 1611 03/14/15 1948 03/15/15 03/15/15 0406 03/15/15 0737  GLUCAP 224* 162* 137* 129* 118*     Signed:  Penny Pia  [facility] 03/15/2015, 10:28 AM

## 2015-03-15 NOTE — Progress Notes (Signed)
Patient refused SCD. States "I don't like them". Patient educated and MD notified.

## 2015-03-19 DIAGNOSIS — E1122 Type 2 diabetes mellitus with diabetic chronic kidney disease: Secondary | ICD-10-CM | POA: Diagnosis not present

## 2015-03-19 DIAGNOSIS — I120 Hypertensive chronic kidney disease with stage 5 chronic kidney disease or end stage renal disease: Secondary | ICD-10-CM | POA: Diagnosis not present

## 2015-03-19 DIAGNOSIS — E11649 Type 2 diabetes mellitus with hypoglycemia without coma: Secondary | ICD-10-CM | POA: Diagnosis not present

## 2015-03-19 DIAGNOSIS — N186 End stage renal disease: Secondary | ICD-10-CM

## 2015-03-21 ENCOUNTER — Telehealth (HOSPITAL_COMMUNITY): Payer: Self-pay

## 2015-03-21 NOTE — Telephone Encounter (Signed)
Home Health [facility] calling to inform MD (? Zavitz) pt refusing services.  Afeter reviewing DC note call directed to [facility] as Dr Cena Benton dcd pt and talked about HH in his dc note

## 2015-04-16 ENCOUNTER — Encounter (HOSPITAL_COMMUNITY): Payer: Self-pay

## 2015-04-16 ENCOUNTER — Inpatient Hospital Stay (HOSPITAL_COMMUNITY)
Admission: EM | Admit: 2015-04-16 | Discharge: 2015-04-18 | DRG: 637 | Disposition: A | Discharge status: Home / Self Care | Payer: Medicare Other | Attending: Internal Medicine | Admitting: Internal Medicine

## 2015-04-16 ENCOUNTER — Emergency Department (HOSPITAL_COMMUNITY): Payer: Medicare Other

## 2015-04-16 ENCOUNTER — Ambulatory Visit: Payer: Medicare Other | Admitting: Internal Medicine

## 2015-04-16 DIAGNOSIS — E43 Unspecified severe protein-calorie malnutrition: Secondary | ICD-10-CM | POA: Diagnosis not present

## 2015-04-16 DIAGNOSIS — F0390 Unspecified dementia without behavioral disturbance: Secondary | ICD-10-CM | POA: Diagnosis present

## 2015-04-16 DIAGNOSIS — Z888 Allergy status to other drugs, medicaments and biological substances status: Secondary | ICD-10-CM

## 2015-04-16 DIAGNOSIS — Z992 Dependence on renal dialysis: Secondary | ICD-10-CM

## 2015-04-16 DIAGNOSIS — D638 Anemia in other chronic diseases classified elsewhere: Secondary | ICD-10-CM | POA: Diagnosis present

## 2015-04-16 DIAGNOSIS — W19XXXA Unspecified fall, initial encounter: Secondary | ICD-10-CM | POA: Diagnosis present

## 2015-04-16 DIAGNOSIS — I10 Essential (primary) hypertension: Secondary | ICD-10-CM | POA: Diagnosis not present

## 2015-04-16 DIAGNOSIS — IMO0002 Reserved for concepts with insufficient information to code with codable children: Secondary | ICD-10-CM | POA: Diagnosis present

## 2015-04-16 DIAGNOSIS — E1122 Type 2 diabetes mellitus with diabetic chronic kidney disease: Secondary | ICD-10-CM | POA: Diagnosis not present

## 2015-04-16 DIAGNOSIS — I420 Dilated cardiomyopathy: Secondary | ICD-10-CM | POA: Diagnosis present

## 2015-04-16 DIAGNOSIS — E11649 Type 2 diabetes mellitus with hypoglycemia without coma: Principal | ICD-10-CM | POA: Diagnosis present

## 2015-04-16 DIAGNOSIS — Z23 Encounter for immunization: Secondary | ICD-10-CM

## 2015-04-16 DIAGNOSIS — N2581 Secondary hyperparathyroidism of renal origin: Secondary | ICD-10-CM | POA: Diagnosis not present

## 2015-04-16 DIAGNOSIS — Z87891 Personal history of nicotine dependence: Secondary | ICD-10-CM | POA: Diagnosis not present

## 2015-04-16 DIAGNOSIS — E1129 Type 2 diabetes mellitus with other diabetic kidney complication: Secondary | ICD-10-CM

## 2015-04-16 DIAGNOSIS — R627 Adult failure to thrive: Secondary | ICD-10-CM | POA: Diagnosis present

## 2015-04-16 DIAGNOSIS — Z833 Family history of diabetes mellitus: Secondary | ICD-10-CM

## 2015-04-16 DIAGNOSIS — M25561 Pain in right knee: Secondary | ICD-10-CM | POA: Diagnosis present

## 2015-04-16 DIAGNOSIS — E162 Hypoglycemia, unspecified: Secondary | ICD-10-CM | POA: Diagnosis present

## 2015-04-16 DIAGNOSIS — E559 Vitamin D deficiency, unspecified: Secondary | ICD-10-CM | POA: Diagnosis not present

## 2015-04-16 DIAGNOSIS — G4700 Insomnia, unspecified: Secondary | ICD-10-CM | POA: Diagnosis present

## 2015-04-16 DIAGNOSIS — I120 Hypertensive chronic kidney disease with stage 5 chronic kidney disease or end stage renal disease: Secondary | ICD-10-CM | POA: Diagnosis present

## 2015-04-16 DIAGNOSIS — N186 End stage renal disease: Secondary | ICD-10-CM | POA: Diagnosis present

## 2015-04-16 DIAGNOSIS — Z90710 Acquired absence of both cervix and uterus: Secondary | ICD-10-CM | POA: Diagnosis not present

## 2015-04-16 DIAGNOSIS — E785 Hyperlipidemia, unspecified: Secondary | ICD-10-CM | POA: Diagnosis present

## 2015-04-16 DIAGNOSIS — I509 Heart failure, unspecified: Secondary | ICD-10-CM | POA: Diagnosis present

## 2015-04-16 DIAGNOSIS — E1151 Type 2 diabetes mellitus with diabetic peripheral angiopathy without gangrene: Secondary | ICD-10-CM | POA: Diagnosis present

## 2015-04-16 DIAGNOSIS — F411 Generalized anxiety disorder: Secondary | ICD-10-CM | POA: Diagnosis not present

## 2015-04-16 DIAGNOSIS — E1165 Type 2 diabetes mellitus with hyperglycemia: Secondary | ICD-10-CM

## 2015-04-16 DIAGNOSIS — R197 Diarrhea, unspecified: Secondary | ICD-10-CM | POA: Diagnosis not present

## 2015-04-16 DIAGNOSIS — H269 Unspecified cataract: Secondary | ICD-10-CM | POA: Diagnosis not present

## 2015-04-16 DIAGNOSIS — Z681 Body mass index (BMI) 19 or less, adult: Secondary | ICD-10-CM | POA: Diagnosis not present

## 2015-04-16 DIAGNOSIS — Z79899 Other long term (current) drug therapy: Secondary | ICD-10-CM | POA: Diagnosis not present

## 2015-04-16 DIAGNOSIS — R630 Anorexia: Secondary | ICD-10-CM | POA: Diagnosis present

## 2015-04-16 LAB — CBC WITH DIFFERENTIAL/PLATELET
Basophils Absolute: 0 10*3/uL (ref 0.0–0.1)
Basophils Relative: 0 % (ref 0–1)
EOS ABS: 0.1 10*3/uL (ref 0.0–0.7)
Eosinophils Relative: 2 % (ref 0–5)
HEMATOCRIT: 37.6 % (ref 36.0–46.0)
HEMOGLOBIN: 11.4 g/dL — AB (ref 12.0–15.0)
LYMPHS PCT: 19 % (ref 12–46)
Lymphs Abs: 1 10*3/uL (ref 0.7–4.0)
MCH: 27.1 pg (ref 26.0–34.0)
MCHC: 30.3 g/dL (ref 30.0–36.0)
MCV: 89.5 fL (ref 78.0–100.0)
MONOS PCT: 15 % — AB (ref 3–12)
Monocytes Absolute: 0.8 10*3/uL (ref 0.1–1.0)
NEUTROS ABS: 3.3 10*3/uL (ref 1.7–7.7)
Neutrophils Relative %: 64 % (ref 43–77)
Platelets: 153 10*3/uL (ref 150–400)
RBC: 4.2 MIL/uL (ref 3.87–5.11)
RDW: 16.1 % — ABNORMAL HIGH (ref 11.5–15.5)
WBC: 5.3 10*3/uL (ref 4.0–10.5)

## 2015-04-16 LAB — COMPREHENSIVE METABOLIC PANEL
ALT: 20 U/L (ref 14–54)
AST: 36 U/L (ref 15–41)
Albumin: 2.5 g/dL — ABNORMAL LOW (ref 3.5–5.0)
Alkaline Phosphatase: 78 U/L (ref 38–126)
Anion gap: 14 (ref 5–15)
BUN: 35 mg/dL — ABNORMAL HIGH (ref 6–20)
CO2: 20 mmol/L — AB (ref 22–32)
CREATININE: 5.21 mg/dL — AB (ref 0.44–1.00)
Calcium: 8.4 mg/dL — ABNORMAL LOW (ref 8.9–10.3)
Chloride: 100 mmol/L — ABNORMAL LOW (ref 101–111)
GFR calc Af Amer: 8 mL/min — ABNORMAL LOW (ref >60)
GFR calc non Af Amer: 7 mL/min — ABNORMAL LOW (ref >60)
Glucose, Bld: 84 mg/dL (ref 65–99)
Potassium: 4.2 mmol/L (ref 3.5–5.1)
Sodium: 134 mmol/L — ABNORMAL LOW (ref 135–145)
Total Bilirubin: 0.3 mg/dL (ref 0.3–1.2)
Total Protein: 6.2 g/dL — ABNORMAL LOW (ref 6.5–8.1)

## 2015-04-16 LAB — GLUCOSE, CAPILLARY: GLUCOSE-CAPILLARY: 132 mg/dL — AB (ref 65–99)

## 2015-04-16 MED ORDER — DEXTROSE 10 % IV SOLN
INTRAVENOUS | Status: AC
  Administered 2015-04-16: via INTRAVENOUS
  Filled 2015-04-16: qty 1000

## 2015-04-16 MED ORDER — NEPRO/CARBSTEADY PO LIQD: 237.0000 mL | ORAL | Status: DC | PRN

## 2015-04-16 MED ORDER — CARVEDILOL 12.5 MG PO TABS
12.5000 mg | ORAL_TABLET | Freq: Two times a day (BID) | ORAL | Status: DC
  Administered 2015-04-17 – 2015-04-18 (×2): 12.5 mg via ORAL
  Filled 2015-04-16 (×3): qty 1

## 2015-04-16 MED ORDER — ENSURE ENLIVE PO LIQD: 237.0000 mL | Freq: Three times a day (TID) | ORAL | Status: DC

## 2015-04-16 MED ORDER — SODIUM CHLORIDE 0.9 % IV SOLN: 100.0000 mL | INTRAVENOUS | Status: DC | PRN

## 2015-04-16 MED ORDER — DEXTROSE 50 % IV SOLN
50.0000 mL | Freq: Once | INTRAVENOUS | Status: AC
  Administered 2015-04-16: 50 mL via INTRAVENOUS

## 2015-04-16 MED ORDER — PENTAFLUOROPROP-TETRAFLUOROETH EX AERO: 1.0000 "application " | INHALATION_SPRAY | CUTANEOUS | Status: DC | PRN

## 2015-04-16 MED ORDER — THIAMINE HCL 100 MG/ML IJ SOLN: 100.0000 mg | Freq: Every day | INTRAMUSCULAR | Status: DC

## 2015-04-16 MED ORDER — ONDANSETRON HCL 4 MG/2ML IJ SOLN: 4.0000 mg | Freq: Four times a day (QID) | INTRAMUSCULAR | Status: DC | PRN

## 2015-04-16 MED ORDER — ACETAMINOPHEN 325 MG PO TABS: 650.0000 mg | ORAL_TABLET | Freq: Four times a day (QID) | ORAL | Status: DC | PRN

## 2015-04-16 MED ORDER — AMLODIPINE BESYLATE 10 MG PO TABS
10.0000 mg | ORAL_TABLET | Freq: Every day | ORAL | Status: DC
  Administered 2015-04-16 – 2015-04-17 (×2): 10 mg via ORAL
  Filled 2015-04-16 (×2): qty 1

## 2015-04-16 MED ORDER — INFLUENZA VAC SPLIT QUAD 0.5 ML IM SUSY
0.5000 mL | PREFILLED_SYRINGE | INTRAMUSCULAR | Status: AC
  Administered 2015-04-17: 0.5 mL via INTRAMUSCULAR
  Filled 2015-04-16: qty 0.5

## 2015-04-16 MED ORDER — DEXTROSE 50 % IV SOLN
50.0000 mL | Freq: Once | INTRAVENOUS | Status: AC
  Administered 2015-04-16: 50 mL via INTRAVENOUS
  Filled 2015-04-16: qty 50

## 2015-04-16 MED ORDER — PNEUMOCOCCAL VAC POLYVALENT 25 MCG/0.5ML IJ INJ
0.5000 mL | INJECTION | INTRAMUSCULAR | Status: AC
  Administered 2015-04-17: 0.5 mL via INTRAMUSCULAR
  Filled 2015-04-16: qty 0.5

## 2015-04-16 MED ORDER — HYDROCODONE-ACETAMINOPHEN 5-325 MG PO TABS: 1.0000 | ORAL_TABLET | ORAL | Status: DC | PRN

## 2015-04-16 MED ORDER — LIDOCAINE-PRILOCAINE 2.5-2.5 % EX CREA
1.0000 "application " | TOPICAL_CREAM | CUTANEOUS | Status: DC | PRN
  Filled 2015-04-16: qty 5

## 2015-04-16 MED ORDER — ENSURE PO LIQD: 237.0000 mL | Freq: Three times a day (TID) | ORAL | Status: DC

## 2015-04-16 MED ORDER — LIDOCAINE HCL (PF) 1 % IJ SOLN: 5.0000 mL | INTRAMUSCULAR | Status: DC | PRN

## 2015-04-16 MED ORDER — SODIUM CHLORIDE 0.9 % IJ SOLN
3.0000 mL | Freq: Two times a day (BID) | INTRAMUSCULAR | Status: DC
  Administered 2015-04-16 – 2015-04-17 (×3): 3 mL via INTRAVENOUS

## 2015-04-16 MED ORDER — MIRTAZAPINE 15 MG PO TABS
15.0000 mg | ORAL_TABLET | Freq: Every day | ORAL | Status: DC
  Administered 2015-04-16 – 2015-04-17 (×2): 15 mg via ORAL
  Filled 2015-04-16 (×2): qty 1

## 2015-04-16 MED ORDER — SERTRALINE HCL 50 MG PO TABS
50.0000 mg | ORAL_TABLET | Freq: Every day | ORAL | Status: DC
  Administered 2015-04-17 – 2015-04-18 (×2): 50 mg via ORAL
  Filled 2015-04-16 (×2): qty 1

## 2015-04-16 MED ORDER — HYDRALAZINE HCL 25 MG PO TABS
25.0000 mg | ORAL_TABLET | Freq: Three times a day (TID) | ORAL | Status: DC
  Administered 2015-04-16 – 2015-04-18 (×5): 25 mg via ORAL
  Filled 2015-04-16 (×5): qty 1

## 2015-04-16 MED ORDER — ONDANSETRON HCL 4 MG PO TABS: 4.0000 mg | ORAL_TABLET | Freq: Four times a day (QID) | ORAL | Status: DC | PRN

## 2015-04-16 MED ORDER — ACETAMINOPHEN 650 MG RE SUPP: 650.0000 mg | Freq: Four times a day (QID) | RECTAL | Status: DC | PRN

## 2015-04-16 MED ORDER — ALTEPLASE 2 MG IJ SOLR
2.0000 mg | Freq: Once | INTRAMUSCULAR | Status: DC | PRN
  Filled 2015-04-16: qty 2

## 2015-04-16 MED ORDER — DEXTROSE 50 % IV SOLN
INTRAVENOUS | Status: AC
  Filled 2015-04-16: qty 50

## 2015-04-16 NOTE — ED Notes (Signed)
CBG 39.  Dr. Madilyn Hook notified.

## 2015-04-16 NOTE — ED Notes (Signed)
CBG 91 

## 2015-04-16 NOTE — ED Notes (Addendum)
ED CBG 54 on arrival

## 2015-04-16 NOTE — ED Notes (Signed)
CBG 153 

## 2015-04-16 NOTE — ED Notes (Signed)
Orange juice given

## 2015-04-16 NOTE — H&P (Signed)
PCP: [name], MD  Cardiology Tresa Endo CT surgery [facility] Bal  Referring provider Artelia Laroche   Chief Complaint:  Low blood sugar, not eating  HPI: Natalie Wolfe is a 81 y.o. female   has a past medical history of Urinary frequency; Routine general medical examination at a health care facility; Other atopic dermatitis and related conditions; Rash and other nonspecific skin eruption; Other anxiety states; Insomnia, unspecified; PVD (peripheral vascular disease); Unspecified vitamin D deficiency; Anemia, unspecified; Obesity, unspecified; Nocturia; Pain in joint, lower leg; Other and unspecified hyperlipidemia; Unspecified essential hypertension; Cataract; CHF (congestive heart failure); Thrombocytopenia; Chronic kidney disease, stage II (mild); Type II or unspecified type diabetes mellitus with renal manifestations, not stated as uncontrolled; Memory loss (02/10/2015); Dementia; and Failure to thrive in adult (03/09/2015).   Presented with hypoglycemia. Patient was found on the floor by her cousin. Patietn has very poor appetite hardly eats at all now. Have been persistently hypoglycemic. Down to 30's at home. EMS arrived to her home administered orange juice, with partial response up to 53. Started on D10. On arrival to [facility] department blood glucose still down in the 50s patient has been off of insulin but was still taking oral diabetic medications. Patient have refused to eat in the past because she feels that this is causes diarrhea. She is on hemodialysis on Tuesday Thursday and Saturday Thinks her last HD was on Saturday.  In the past patient was attempted to be set up with home health agency but refused. Patient has known history of congestive dilated cardiomyopathy with EF of 35% by echo. Patient was admitted in August for hypoglycemia and failure to thrive with significant malnutrition she was hypoglycemic at that time. The plan was for  patient to be discharged for home health which apparently she refused a later time. Patient suspected to have early stages of dementia.     Hospitalist was called for admission for hypoglycemia and failure to thrive  Review of Systems:    Pertinent positives include: confusion, diarrhea  Constitutional:  No weight loss, night sweats, Fevers, chills, fatigue, weight loss  HEENT:  No headaches, Difficulty swallowing,Tooth/dental problems,Sore throat,  No sneezing, itching, ear ache, nasal congestion, post nasal drip,  Cardio-vascular:  No chest pain, Orthopnea, PND, anasarca, dizziness, palpitations.no Bilateral lower extremity swelling  GI:  No heartburn, indigestion, abdominal pain, nausea, vomiting, , change in bowel habits, loss of appetite, melena, blood in stool, hematemesis Resp:  no shortness of breath at rest. No dyspnea on exertion, No excess mucus, no productive cough, No non-productive cough, No coughing up of blood.No change in color of mucus.No wheezing. Skin:  no rash or lesions. No jaundice GU:  no dysuria, change in color of urine, no urgency or frequency. No straining to urinate.  No flank pain.  Musculoskeletal:  No joint pain or no joint swelling. No decreased range of motion. No back pain.  Psych:  No change in mood or affect. No depression or anxiety. No memory loss.  Neuro: no localizing neurological complaints, no tingling, no weakness, no double vision, no gait abnormality, no slurred speech, no   Otherwise ROS are negative except for above, 10 systems were reviewed  Past Medical History: Past Medical History  Diagnosis Date  . Urinary frequency   . Routine general medical examination at a health care facility   . Other atopic dermatitis and related conditions   . Rash and other nonspecific skin eruption   . Other anxiety states   .  Insomnia, unspecified   . PVD (peripheral vascular disease)   . Unspecified vitamin D deficiency   . Anemia,  unspecified   . Obesity, unspecified   . Nocturia   . Pain in joint, lower leg   . Other and unspecified hyperlipidemia   . Unspecified essential hypertension   . Cataract   . CHF (congestive heart failure)   . Thrombocytopenia   . Chronic kidney disease, stage II (mild)     [facility]  . Type II or unspecified type diabetes mellitus with renal manifestations, not stated as uncontrolled   . Memory loss 02/10/2015  . Dementia     forgetful, .  . Failure to thrive in adult 03/09/2015   Past Surgical History  Procedure Laterality Date  . Abdominal hysterectomy  1970  . Breast surgery  1979    nodule removed  . Eye surgery  2004    catarcts removed from right eye.  . Multiple tooth extractions  09/2013    Remonve remaining 3 teeth, no with dentures   . Av fistula placement Left 09/06/2014    Procedure: CREATION OF LEFT UPPER ARM ARTERIOVENOUS (AV) FISTULA ;  Surgeon: Pryor Ochoa, MD;  Location: [facility] OR;  Service: Vascular;  Laterality: Left;  . Insertion of dialysis catheter N/A 09/06/2014    Procedure: INSERTION OF DIALYSIS CATHETER RIGHT INTERNAL JUGULAR VEIN;  Surgeon: Pryor Ochoa, MD;  Location: [facility] OR;  Service: Vascular;  Laterality: N/A;  . Ligation of competing branches of arteriovenous fistula Left 09/06/2014    Procedure: LIGATION OF COMPETING BRANCHES OF ARTERIOVENOUS FISTULA;  Surgeon: Pryor Ochoa, MD;  Location: [facility] OR;  Service: Vascular;  Laterality: Left;  . Left heart catheterization with coronary angiogram N/A 09/17/2014    Procedure: LEFT HEART CATHETERIZATION WITH CORONARY ANGIOGRAM;  Surgeon: Lennette Bihari, MD;  Location: [facility] CATH LAB;  Service: Cardiovascular;  Laterality: N/A;  . False aneurysm repair Left 03/07/2015    Procedure: REPAIR PSEUDOANEURYSMS OF LEFT ARM ARTERIOVENOUS FISTULA;  Surgeon: Chuck Hint, MD;  Location: [facility] OR;  Service: Vascular;  Laterality: Left;     Medications: Prior to Admission medications     Medication Sig Start Date End Date Taking? Authorizing Provider  acetaminophen (TYLENOL) 500 MG tablet Take 500 mg by mouth every 6 (six) hours as needed (for pain).     Historical Provider, MD  amLODipine (NORVASC) 10 MG tablet Take 10 mg by mouth at bedtime.    Historical Provider, MD  carvedilol (COREG) 12.5 MG tablet One twice daily to strengthen the heart and control BP Patient taking differently: Take 12.5 mg by mouth 2 (two) times daily with a meal. One twice daily to strengthen the heart and control BP 02/12/15   [name], MD  ENSURE (ENSURE) Take 237 mLs by mouth 3 (three) times daily between meals.    Historical Provider, MD  fluocinonide cream (LIDEX) 0.05 % Apply 1 application topically 2 (two) times daily. Apply from neck down twice a day (provided by dermatology).    Historical Provider, MD  glucose blood (BAYER CONTOUR NEXT TEST) test strip Use as instructed 11/27/12   Edison Pace, RPH-CPP  hydrALAZINE (APRESOLINE) 25 MG tablet Take 1 tablet (25 mg total) by mouth every 8 (eight) hours. 11/07/14   [name], MD  lidocaine-prilocaine (EMLA) cream Apply 1 application topically as needed (used on dialysis days).     Historical Provider, MD  mirtazapine (REMERON) 15 MG tablet Take 15 mg  by mouth at bedtime.    Historical Provider, MD  multivitamin (RENA-VIT) TABS tablet Take 1 tablet by mouth at bedtime. 03/15/15   Penny Pia, MD  Nutritional Supplements (FEEDING SUPPLEMENT, NEPRO CARB STEADY,) LIQD Take 237 mLs by mouth as needed (missed meal during dialysis.). 03/15/15   Penny Pia, MD  sertraline (ZOLOFT) 50 MG tablet Take 1 tablet (50 mg total) by mouth daily. 02/12/15   [name], MD    Allergies:   Allergies  Allergen Reactions  . Heparin Other (See Comments)    Positive Hep Induced Plt Ab and SRA 09/12/2014    Social History:  Ambulatory  Independently  Lives at home alone,         reports that she quit smoking about 14 years ago. Her smoking use included  Cigarettes. She has never used smokeless tobacco. She reports that she does not drink alcohol or use illicit drugs.    Family History: family history includes Diabetes in her mother.    Physical Exam: Patient Vitals for the past 24 hrs:  BP Temp Temp src Pulse Resp SpO2  04/16/15 1845 178/71 mmHg - - 68 17 99 %  04/16/15 1830 166/76 mmHg - - 68 13 100 %  04/16/15 1815 183/74 mmHg - - 68 18 99 %  04/16/15 1800 182/68 mmHg - - 71 19 98 %  04/16/15 1746 191/67 mmHg - - 71 - 100 %  04/16/15 1630 186/72 mmHg - - 70 - 99 %  04/16/15 1615 176/58 mmHg - - 66 - 100 %  04/16/15 1600 177/64 mmHg - - 68 - 98 %  04/16/15 1552 - 97.5 F (36.4 C) Oral - - -  04/16/15 1546 176/66 mmHg - - 65 - 98 %  04/16/15 1539 - - - - - 96 %    1. General:  in No Acute distress 2. Psychological: Alert and Oriented 3. Head/ENT:    Dry Mucous Membranes                          Head Non traumatic, neck supple                          Normal   Dentition 4. SKIN:  decreased Skin turgor,  Skin clean Dry and intact no rash 5. Heart: Regular rate and rhythm no Murmur, Rub or gallop 6. Lungs: Clear to auscultation bilaterally, no wheezes or crackles   7. Abdomen: Soft, non-tender, Non distended 8. Lower extremities: no clubbing, cyanosis, or edema 9. Neurologically Grossly intact, moving all 4 extremities equally 10. MSK: Normal range of motion  body mass index is unknown because there is no weight on file.   Labs on Admission:   Results for orders placed or performed during the hospital encounter of 04/16/15 (from the past 24 hour(s))  Comprehensive metabolic panel     Status: Abnormal   Collection Time: 04/16/15  4:28 PM  Result Value Ref Range   Sodium 134 (L) 135 - 145 mmol/L   Potassium 4.2 3.5 - 5.1 mmol/L   Chloride 100 (L) 101 - 111 mmol/L   CO2 20 (L) 22 - 32 mmol/L   Glucose, Bld 84 65 - 99 mg/dL   BUN 35 (H) 6 - 20 mg/dL   Creatinine, Ser 9.60 (H) 0.44 - 1.00 mg/dL   Calcium 8.4 (L) 8.9 -  10.3 mg/dL   Total Protein 6.2 (L) 6.5 -  8.1 g/dL   Albumin 2.5 (L) 3.5 - 5.0 g/dL   AST 36 15 - 41 U/L   ALT 20 14 - 54 U/L   Alkaline Phosphatase 78 38 - 126 U/L   Total Bilirubin 0.3 0.3 - 1.2 mg/dL   GFR calc non Af Amer 7 (L) >60 mL/min   GFR calc Af Amer 8 (L) >60 mL/min   Anion gap 14 5 - 15  CBC with Differential     Status: Abnormal   Collection Time: 04/16/15  5:32 PM  Result Value Ref Range   WBC 5.3 4.0 - 10.5 K/uL   RBC 4.20 3.87 - 5.11 MIL/uL   Hemoglobin 11.4 (L) 12.0 - 15.0 g/dL   HCT 16.1 09.6 - 04.5 %   MCV 89.5 78.0 - 100.0 fL   MCH 27.1 26.0 - 34.0 pg   MCHC 30.3 30.0 - 36.0 g/dL   RDW 40.9 (H) 81.1 - 91.4 %   Platelets 153 150 - 400 K/uL   Neutrophils Relative % 64 43 - 77 %   Neutro Abs 3.3 1.7 - 7.7 K/uL   Lymphocytes Relative 19 12 - 46 %   Lymphs Abs 1.0 0.7 - 4.0 K/uL   Monocytes Relative 15 (H) 3 - 12 %   Monocytes Absolute 0.8 0.1 - 1.0 K/uL   Eosinophils Relative 2 0 - 5 %   Eosinophils Absolute 0.1 0.0 - 0.7 K/uL   Basophils Relative 0 0 - 1 %   Basophils Absolute 0.0 0.0 - 0.1 K/uL    UA not obtained  Lab Results  Component Value Date   HGBA1C 5.8* 03/12/2015    CrCl cannot be calculated (Unknown ideal weight.).  BNP (last 3 results) No results for input(s): PROBNP in the last 8760 hours.  Other results:  I have pearsonaly reviewed this: ECG REPORT  Rate: 71  Rhythm: Sinus rhythm ST&T Change: No ischemic changes but flattened T waves QTC 451  There were no vitals filed for this visit.   Cultures:    Component Value Date/Time   SDES URINE, RANDOM 03/12/2015 1506   SPECREQUEST NONE 03/12/2015 1506   CULT >=100,000 COLONIES/mL KLEBSIELLA PNEUMONIAE 03/12/2015 1506   REPTSTATUS 03/15/2015 FINAL 03/12/2015 1506     Radiological Exams on Admission: Dg Chest 2 View  04/16/2015   CLINICAL DATA:  Weakness and recent fall  EXAM: CHEST - 2 VIEW  COMPARISON:  12/24/2014  FINDINGS: Cardiac shadow is stable. A dialysis catheter is  again noted on the right. Mild central vascular prominence is seen likely related to the underlying renal failure. No significant pulmonary edema is noted. No focal infiltrate is noted. The bony structures are within normal limits.  IMPRESSION: Stable central vascular prominence without pulmonary edema. No acute abnormality is noted.   Electronically Signed   By: Alcide Clever M.D.   On: 04/16/2015 17:11   Dg Knee Complete 4 Views Right  04/16/2015   CLINICAL DATA:  Patient status post fall. Right knee pain and bruising. Initial encounter.  EXAM: RIGHT KNEE - COMPLETE 4+ VIEW  COMPARISON:  None.  FINDINGS: Medial compartment joint space loss and osteophytosis. Lateral view is limited secondary to patient positioning. No definite joint effusion identified. No evidence for acute fracture dislocation. Vascular calcifications.  IMPRESSION: No evidence for acute osseous abnormality.  Medial compartment degenerative changes.   Electronically Signed   By: Annia Belt M.D.   On: 04/16/2015 17:12    Chart has been reviewed  Family not at  Bedside    Assessment/Plan  81 year old female with history of diabetes status, dilated cardiomyopathy, end-stage renal disease on dialysis Tuesday Thursday Saturday presents with recurrent hypoglycemia and poor by mouth intake with failure to thrive  Present on Admission:  . Hypoglycemia - patient not on insulin or PO meds, Poor PO intake will administer D10, encourage PO intake, nutritional consult.  . Anemia of chronic disease -  chronic unchanged  . Anorexia - patient reports no appetite, have not eaten for past 2 days. . Congestive dilated cardiomyopathy-EF 40-45% at cath, 35% by echo - avoid fluid overload, currently fluid down likely due to poor PO intake . DM type 2, uncontrolled, with renal complications currently repeated episodes of hypoglycemia, will check Hg A1c . ESRD (end stage renal disease) - spoke to nephrology who is aware of the patient will need HD  tomorrow . Essential hypertension - continue home meds Diarrhea unclear etiology will order stool culture and c.dif . Protein-calorie malnutrition, severe - order prealbumin, nutritional consult, check for folate and B 12 levels   Prophylaxis: SCD   CODE STATUS:  FULL CODE   as per patient    Disposition:  likely will need placement for rehabilitation         Other plan as per orders.  I have spent a total of 65 min on this admission time taken to discuss case with Dr. Juel Burrow with nephrology  Aarti Mankowski 04/16/2015, 7:22 PM  [facility]  Pager [phone]   after 2 AM please page floor coverage PA If 7AM-7PM, please contact the day team taking care of the patient  [email or web address]  Password [email or web address]

## 2015-04-16 NOTE — ED Provider Notes (Signed)
CSN: [id number]     Arrival date & time 04/16/15  1539 History   First MD Initiated Contact with Patient 04/16/15 1552     Chief Complaint  Patient presents with  . Hypoglycemia  . Failure To Thrive     Patient is a 81 y.o. female presenting with hypoglycemia. The history is provided by the patient. No language interpreter was used.  Hypoglycemia  [name] presents for evaluation of hypoglycemia. History is limited due to some mental confusion on the part of the patient. She reports that she's been feeling poorly lately with decreased appetite. She last took her oral diabetic medication yesterday because she has not been eating anything. She does not want to eat because she has food aversion as well as diarrhea with meals. Her cousin came to check on her today and he found that she had fallen to the floor and was mildly confused. Her blood sugar was checked and was noted to be low. She states that she hurt her knee in the fall but did not hit her head or pass out. She feels closer to her baseline currently. She denies any headache, chest pain, fevers, vomiting. She has end-stage renal disease and is on hemodialysis. Her sessions are Tuesday, Thursday, Saturday. She believes she had a session yesterday but she is not completely certain.  Past Medical History  Diagnosis Date  . Urinary frequency   . Routine general medical examination at a health care facility   . Other atopic dermatitis and related conditions   . Rash and other nonspecific skin eruption   . Other anxiety states   . Insomnia, unspecified   . PVD (peripheral vascular disease)   . Unspecified vitamin D deficiency   . Anemia, unspecified   . Obesity, unspecified   . Nocturia   . Pain in joint, lower leg   . Other and unspecified hyperlipidemia   . Unspecified essential hypertension   . Cataract   . CHF (congestive heart failure)   . Thrombocytopenia   . Chronic kidney disease, stage II (mild)     [facility]  . Type II or unspecified type diabetes mellitus with renal manifestations, not stated as uncontrolled   . Memory loss 02/10/2015  . Dementia     forgetful, .  . Failure to thrive in adult 03/09/2015   Past Surgical History  Procedure Laterality Date  . Abdominal hysterectomy  1970  . Breast surgery  1979    nodule removed  . Eye surgery  2004    catarcts removed from right eye.  . Multiple tooth extractions  09/2013    Remonve remaining 3 teeth, no with dentures   . Av fistula placement Left 09/06/2014    Procedure: CREATION OF LEFT UPPER ARM ARTERIOVENOUS (AV) FISTULA ;  Surgeon: Pryor Ochoa, MD;  Location: [facility] OR;  Service: Vascular;  Laterality: Left;  . Insertion of dialysis catheter N/A 09/06/2014    Procedure: INSERTION OF DIALYSIS CATHETER RIGHT INTERNAL JUGULAR VEIN;  Surgeon: Pryor Ochoa, MD;  Location: [facility] OR;  Service: Vascular;  Laterality: N/A;  . Ligation of competing branches of arteriovenous fistula Left 09/06/2014    Procedure: LIGATION OF COMPETING BRANCHES OF ARTERIOVENOUS FISTULA;  Surgeon: Pryor Ochoa, MD;  Location: [facility] OR;  Service: Vascular;  Laterality: Left;  . Left heart catheterization with coronary angiogram N/A 09/17/2014    Procedure: LEFT HEART CATHETERIZATION WITH CORONARY ANGIOGRAM;  Surgeon: Lennette Bihari, MD;  Location: [facility] CATH  LAB;  Service: Cardiovascular;  Laterality: N/A;  . False aneurysm repair Left 03/07/2015    Procedure: REPAIR PSEUDOANEURYSMS OF LEFT ARM ARTERIOVENOUS FISTULA;  Surgeon: Chuck Hint, MD;  Location: [facility] OR;  Service: Vascular;  Laterality: Left;   Family History  Problem Relation Age of Onset  . Diabetes Mother    Social History  Substance Use Topics  . Smoking status: Former Smoker    Types: Cigarettes    Quit date: 08/10/2000  . Smokeless tobacco: Never Used  . Alcohol Use: No   OB History    No data available     Review of Systems  All other systems reviewed and are  negative.     Allergies  Heparin  Home Medications   Prior to Admission medications   Medication Sig Start Date End Date Taking? Authorizing Provider  acetaminophen (TYLENOL) 500 MG tablet Take 500 mg by mouth every 6 (six) hours as needed (for pain).     Historical Provider, MD  amLODipine (NORVASC) 10 MG tablet Take 10 mg by mouth at bedtime.    Historical Provider, MD  carvedilol (COREG) 12.5 MG tablet One twice daily to strengthen the heart and control BP Patient taking differently: Take 12.5 mg by mouth 2 (two) times daily with a meal. One twice daily to strengthen the heart and control BP 02/12/15   [name], MD  ENSURE (ENSURE) Take 237 mLs by mouth 3 (three) times daily between meals.    Historical Provider, MD  fluocinonide cream (LIDEX) 0.05 % Apply 1 application topically 2 (two) times daily. Apply from neck down twice a day (provided by dermatology).    Historical Provider, MD  glucose blood (BAYER CONTOUR NEXT TEST) test strip Use as instructed 11/27/12   Edison Pace, RPH-CPP  hydrALAZINE (APRESOLINE) 25 MG tablet Take 1 tablet (25 mg total) by mouth every 8 (eight) hours. 11/07/14   [name], MD  lidocaine-prilocaine (EMLA) cream Apply 1 application topically as needed (used on dialysis days).     Historical Provider, MD  mirtazapine (REMERON) 15 MG tablet Take 15 mg by mouth at bedtime.    Historical Provider, MD  multivitamin (RENA-VIT) TABS tablet Take 1 tablet by mouth at bedtime. 03/15/15   Penny Pia, MD  Nutritional Supplements (FEEDING SUPPLEMENT, NEPRO CARB STEADY,) LIQD Take 237 mLs by mouth as needed (missed meal during dialysis.). 03/15/15   Penny Pia, MD  sertraline (ZOLOFT) 50 MG tablet Take 1 tablet (50 mg total) by mouth daily. 02/12/15   [name], MD   BP 176/66 mmHg  Pulse 65  Temp(Src) 97.5 F (36.4 C) (Oral)  SpO2 98% Physical Exam  Constitutional: She is oriented to person, place, and time. She appears well-developed and  well-nourished.  HENT:  Head: Normocephalic and atraumatic.  Cardiovascular: Normal rate and regular rhythm.   Biphasic murmur  Pulmonary/Chest: Effort normal and breath sounds normal. No respiratory distress.  Vas-Cath in the right anterior chest wall, dressing is clean and dry and intact.  Abdominal: Soft. There is no tenderness. There is no rebound and no guarding.  Musculoskeletal:  Ecchymosis and mild swelling over the right anterior knee. Range of motion is intact but there is mild tenderness over the patellar region. No hip tenderness to palpation. Fistula in left upper extremity with palpable thrill.  Neurological: She is alert and oriented to person, place, and time.  Generalized weakness, moves all extremities symmetrically.  Skin: Skin is warm and dry.  Psychiatric: She has  a normal mood and affect. Her behavior is normal.  Nursing note and vitals reviewed.   ED Course  Procedures (including critical care time) CRITICAL CARE Performed by: Tilden Fossa   Total critical care time: 30 minutes  Critical care time was exclusive of separately billable procedures and treating other patients.  Critical care was necessary to treat or prevent imminent or life-threatening deterioration.  Critical care was time spent personally by me on the following activities: development of treatment plan with patient and/or surrogate as well as nursing, discussions with consultants, evaluation of patient's response to treatment, examination of patient, obtaining history from patient or surrogate, ordering and performing treatments and interventions, ordering and review of laboratory studies, ordering and review of radiographic studies, pulse oximetry and re-evaluation of patient's condition.  Labs Review Labs Reviewed  COMPREHENSIVE METABOLIC PANEL - Abnormal; Notable for the following:    Sodium 134 (*)    Chloride 100 (*)    CO2 20 (*)    BUN 35 (*)    Creatinine, Ser 5.21 (*)     Calcium 8.4 (*)    Total Protein 6.2 (*)    Albumin 2.5 (*)    GFR calc non Af Amer 7 (*)    GFR calc Af Amer 8 (*)    All other components within normal limits  CBC WITH DIFFERENTIAL/PLATELET - Abnormal; Notable for the following:    Hemoglobin 11.4 (*)    RDW 16.1 (*)    Monocytes Relative 15 (*)    All other components within normal limits  OVA AND PARASITE EXAMINATION  C DIFFICILE QUICK SCREEN W PCR REFLEX  STOOL CULTURE  CBC WITH DIFFERENTIAL/PLATELET  FECAL LACTOFERRIN  HEMOGLOBIN A1C  VITAMIN B12  FOLATE  RENAL FUNCTION PANEL  CBC  MAGNESIUM  PHOSPHORUS  TSH  COMPREHENSIVE METABOLIC PANEL  CBC  PREALBUMIN  CBG MONITORING, ED  CBG MONITORING, ED    Imaging Review Dg Chest 2 View  04/16/2015   CLINICAL DATA:  Weakness and recent fall  EXAM: CHEST - 2 VIEW  COMPARISON:  12/24/2014  FINDINGS: Cardiac shadow is stable. A dialysis catheter is again noted on the right. Mild central vascular prominence is seen likely related to the underlying renal failure. No significant pulmonary edema is noted. No focal infiltrate is noted. The bony structures are within normal limits.  IMPRESSION: Stable central vascular prominence without pulmonary edema. No acute abnormality is noted.   Electronically Signed   By: Alcide Clever M.D.   On: 04/16/2015 17:11   Dg Knee Complete 4 Views Right  04/16/2015   CLINICAL DATA:  Patient status post fall. Right knee pain and bruising. Initial encounter.  EXAM: RIGHT KNEE - COMPLETE 4+ VIEW  COMPARISON:  None.  FINDINGS: Medial compartment joint space loss and osteophytosis. Lateral view is limited secondary to patient positioning. No definite joint effusion identified. No evidence for acute fracture dislocation. Vascular calcifications.  IMPRESSION: No evidence for acute osseous abnormality.  Medial compartment degenerative changes.   Electronically Signed   By: Annia Belt M.D.   On: 04/16/2015 17:12   I have personally reviewed and evaluated these  images and lab results as part of my medical decision-making.   EKG Interpretation   Date/Time:  Wednesday April 16 2015 16:33:08 EDT Ventricular Rate:  71 PR Interval:  126 QRS Duration: 78 QT Interval:  415 QTC Calculation: 451 R Axis:   51 Text Interpretation:  Sinus rhythm Probable left atrial enlargement  Probable LVH with secondary repol  abnrm Confirmed by Lincoln Brigham [phone]) on  04/16/2015 4:49:08 PM      MDM   Final diagnoses:  Hypoglycemia  ESRD (end stage renal disease) on dialysis    Patient with end-stage renal disease on hemodialysis here for episode of hypoglycemia. She is not taking in food by mouth because she does not feel the desire to eat. She has had some juice with mild improvement in her blood sugar. Patient was given D10 drip by EMS with transient improvement in her blood sugar. She did have recurrent hypoglycemia and was given D50. She had juice again and then had recurrent hypoglycemia and received a second dose of D50. Plan to admit hospitalist service for recurrent hypoglycemia with end-stage renal disease.    Tilden Fossa, MD 04/16/15 2226
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
## 2015-04-16 NOTE — ED Notes (Signed)
CBG 132. 

## 2015-04-16 NOTE — Consult Note (Signed)
Reason for Consult: ESRD Referring Physician:  Dr. Roel Cluck  Chief Complaint: Found on the floor  Assessment/Plan: 1. ESRD - TTS at [facility] with last HD on Tuesday, EDW 46.5kg which may still be generous. Blood pressure on the higher side which suggests that she is not at her actual EDW. Hospital weight is 46.4kg which is less than her listed EDW but I suspect her true EDW has not been reached because of a dropping weight from malnutrition. - No absolute indication for dialysis tonight --> plan on HD in the AM. - LUA BCF has a nice bruit but for some reason not being used (maybe because of thin skin?) - Currently HD through RIJ tunneled catheter. 2. Hypoglycemia - Ok to start Dextrose gtt if needed as she is not hypoxic and no evidence of hypervolemia. 3. PVD - stable. 4. Hypertension - will try to establish a new EDW --> listed as 46.5kg but likely lower. 5. CHF - EF 35% (dilated CM) --> compensated. 6. Failure to thrive - should get nutritional supplements.    HPI: Natalie Wolfe is an 81 y.o. female  On dialysis at [facility] with Dr. Marval Regal HD on TTS regimen with last HD Tuesday. EDW listed as 46.5kg but has been losing weight with anorexia with very poor oral intake. She presents after being found down on the ground; she states that her right knee gave out but she denies any head trauma. She states that the right knee gives out intermittently and she did not slip or experience "room spinning." She was found to be hypoglycemic in the field and was started on a D10 gtt by EMS. In the ED on repeat when I saw the patient her FS was 37 at which time she was given an amp of D50. She has had a prior admission for hypoglycemia and FTT + malnutrition. She is currently off insulin but is still on oral hypoglycemics. She denies any chest pain, dizziness, dyspnea, fever, chills, nausea; she states that whatever she eats runs through her system. She also denies any abdominal pain.  Review of  Systems  Constitutional: Positive for weight loss. Negative for fever, chills and diaphoresis.  HENT: Negative for nosebleeds and tinnitus.   Eyes: Negative for blurred vision and double vision.  Respiratory: Negative for cough and shortness of breath.   Cardiovascular: Negative for chest pain, orthopnea and claudication.  Gastrointestinal: Negative for heartburn, nausea, vomiting and abdominal pain.  Genitourinary: Negative for dysuria.  Musculoskeletal: Positive for joint pain. Negative for myalgias and neck pain.  Skin: Negative for rash.  Neurological: Positive for weakness. Negative for dizziness, seizures, loss of consciousness and headaches.  Endo/Heme/Allergies: Negative for polydipsia. Does not bruise/bleed easily.  Psychiatric/Behavioral: Negative for depression.   Pertinent items are noted in HPI.  Chemistry and CBC: CREATININE, SER  Date/Time Value Ref Range Status  04/16/2015 04:28 PM 5.21* 0.44 - 1.00 mg/dL Final  03/15/2015 10:30 AM 6.23* 0.44 - 1.00 mg/dL Final  03/14/2015 05:22 AM 4.53* 0.44 - 1.00 mg/dL Final  03/13/2015 03:00 PM 3.33* 0.44 - 1.00 mg/dL Final    Comment:    DELTA CHECK NOTED  03/13/2015 07:49 AM 8.17* 0.44 - 1.00 mg/dL Final  03/13/2015 04:10 AM 8.19* 0.44 - 1.00 mg/dL Final  03/12/2015 02:52 PM 7.30* 0.44 - 1.00 mg/dL Final  03/12/2015 12:52 PM 7.75* 0.44 - 1.00 mg/dL Final  03/10/2015 08:41 AM 5.26* 0.57 - 1.00 mg/dL Final  01/31/2015 02:44 PM 2.40* 0.44 - 1.00 mg/dL Final  01/31/2015 01:25 PM 4.00* 0.44 - 1.00 mg/dL Final  01/31/2015 12:25 PM 7.20* 0.44 - 1.00 mg/dL Final  01/31/2015 10:36 AM 7.18* 0.44 - 1.00 mg/dL Final  12/24/2014 07:51 AM 4.00* 0.44 - 1.00 mg/dL Final  12/23/2014 05:00 AM 5.57* 0.44 - 1.00 mg/dL Final  12/22/2014 05:57 AM 4.03* 0.44 - 1.00 mg/dL Final  12/21/2014 05:24 PM 3.08* 0.44 - 1.00 mg/dL Final  09/17/2014 05:45 AM 8.91* 0.50 - 1.10 mg/dL Final  09/16/2014 07:49 AM 7.00* 0.50 - 1.10 mg/dL Final    Comment:     DELTA CHECK NOTED  09/15/2014 06:22 AM 4.38* 0.50 - 1.10 mg/dL Final    Comment:    DELTA CHECK NOTED  09/14/2014 05:00 AM 6.84* 0.50 - 1.10 mg/dL Final    Comment:    DELTA CHECK NOTED REPEATED TO VERIFY   09/13/2014 05:19 AM 3.93* 0.50 - 1.10 mg/dL Final    Comment:    DELTA CHECK NOTED  09/12/2014 12:53 PM 8.08* 0.50 - 1.10 mg/dL Final  09/07/2014 12:00 PM 6.21* 0.50 - 1.10 mg/dL Final  09/06/2014 04:40 AM 6.14* 0.50 - 1.10 mg/dL Final  09/05/2014 07:46 AM 7.00* 0.50 - 1.10 mg/dL Final  09/04/2014 06:24 AM 9.11* 0.50 - 1.10 mg/dL Final  09/03/2014 06:10 AM 9.40* 0.50 - 1.10 mg/dL Final  09/02/2014 06:18 AM 9.31* 0.50 - 1.10 mg/dL Final  09/01/2014 04:51 PM 9.81* 0.50 - 1.10 mg/dL Final  09/01/2014 09:57 AM 10.34* 0.50 - 1.10 mg/dL Final  09/01/2014 04:42 AM 10.48* 0.50 - 1.10 mg/dL Final  08/31/2014 06:33 AM 11.75* 0.50 - 1.10 mg/dL Final  08/29/2014 08:42 AM 9.90* 0.57 - 1.00 mg/dL Final  04/01/2014 10:23 AM 2.04* 0.57 - 1.00 mg/dL Final  12/17/2013 08:35 AM 2.10* 0.57 - 1.00 mg/dL Final  11/27/2013 10:20 AM 3.32* 0.57 - 1.00 mg/dL Final  07/24/2013 10:02 AM 2.44* 0.57 - 1.00 mg/dL Final  05/01/2013 11:49 AM 2.65* 0.57 - 1.00 mg/dL Final  11/23/2012 08:11 AM 2.68* 0.57 - 1.00 mg/dL Final    Recent Labs Lab 04/16/15 1628  NA 134*  K 4.2  CL 100*  CO2 20*  GLUCOSE 84  BUN 35*  CREATININE 5.21*  CALCIUM 8.4*    Recent Labs Lab 04/16/15 1732  WBC 5.3  NEUTROABS 3.3  HGB 11.4*  HCT 37.6  MCV 89.5  PLT 153   Liver Function Tests:  Recent Labs Lab 04/16/15 1628  AST 36  ALT 20  ALKPHOS 78  BILITOT 0.3  PROT 6.2*  ALBUMIN 2.5*   No results for input(s): LIPASE, AMYLASE in the last 168 hours. No results for input(s): AMMONIA in the last 168 hours. Cardiac Enzymes: No results for input(s): CKTOTAL, CKMB, CKMBINDEX, TROPONINI in the last 168 hours. Iron Studies: No results for input(s): IRON, TIBC, TRANSFERRIN, FERRITIN in the last 72  hours. PT/INR: @LABRCNTIP (inr:5)  Xrays/Other Studies: ) Results for orders placed or performed during the hospital encounter of 04/16/15 (from the past 48 hour(s))  Comprehensive metabolic panel     Status: Abnormal   Collection Time: 04/16/15  4:28 PM  Result Value Ref Range   Sodium 134 (L) 135 - 145 mmol/L   Potassium 4.2 3.5 - 5.1 mmol/L   Chloride 100 (L) 101 - 111 mmol/L   CO2 20 (L) 22 - 32 mmol/L   Glucose, Bld 84 65 - 99 mg/dL   BUN 35 (H) 6 - 20 mg/dL   Creatinine, Ser 5.21 (H) 0.44 - 1.00 mg/dL   Calcium 8.4 (L) 8.9 -  10.3 mg/dL   Total Protein 6.2 (L) 6.5 - 8.1 g/dL   Albumin 2.5 (L) 3.5 - 5.0 g/dL   AST 36 15 - 41 U/L   ALT 20 14 - 54 U/L   Alkaline Phosphatase 78 38 - 126 U/L   Total Bilirubin 0.3 0.3 - 1.2 mg/dL   GFR calc non Af Amer 7 (L) >60 mL/min   GFR calc Af Amer 8 (L) >60 mL/min    Comment: (NOTE) The eGFR has been calculated using the CKD EPI equation. This calculation has not been validated in all clinical situations. eGFR's persistently <60 mL/min signify possible Chronic Kidney Disease.    Anion gap 14 5 - 15  CBC with Differential     Status: Abnormal   Collection Time: 04/16/15  5:32 PM  Result Value Ref Range   WBC 5.3 4.0 - 10.5 K/uL   RBC 4.20 3.87 - 5.11 MIL/uL   Hemoglobin 11.4 (L) 12.0 - 15.0 g/dL   HCT 37.6 36.0 - 46.0 %   MCV 89.5 78.0 - 100.0 fL   MCH 27.1 26.0 - 34.0 pg   MCHC 30.3 30.0 - 36.0 g/dL   RDW 16.1 (H) 11.5 - 15.5 %   Platelets 153 150 - 400 K/uL   Neutrophils Relative % 64 43 - 77 %   Neutro Abs 3.3 1.7 - 7.7 K/uL   Lymphocytes Relative 19 12 - 46 %   Lymphs Abs 1.0 0.7 - 4.0 K/uL   Monocytes Relative 15 (H) 3 - 12 %   Monocytes Absolute 0.8 0.1 - 1.0 K/uL   Eosinophils Relative 2 0 - 5 %   Eosinophils Absolute 0.1 0.0 - 0.7 K/uL   Basophils Relative 0 0 - 1 %   Basophils Absolute 0.0 0.0 - 0.1 K/uL   Dg Chest 2 View  04/16/2015   CLINICAL DATA:  Weakness and recent fall  EXAM: CHEST - 2 VIEW  COMPARISON:   12/24/2014  FINDINGS: Cardiac shadow is stable. A dialysis catheter is again noted on the right. Mild central vascular prominence is seen likely related to the underlying renal failure. No significant pulmonary edema is noted. No focal infiltrate is noted. The bony structures are within normal limits.  IMPRESSION: Stable central vascular prominence without pulmonary edema. No acute abnormality is noted.   Electronically Signed   By: Inez Catalina M.D.   On: 04/16/2015 17:11   Dg Knee Complete 4 Views Right  04/16/2015   CLINICAL DATA:  Patient status post fall. Right knee pain and bruising. Initial encounter.  EXAM: RIGHT KNEE - COMPLETE 4+ VIEW  COMPARISON:  None.  FINDINGS: Medial compartment joint space loss and osteophytosis. Lateral view is limited secondary to patient positioning. No definite joint effusion identified. No evidence for acute fracture dislocation. Vascular calcifications.  IMPRESSION: No evidence for acute osseous abnormality.  Medial compartment degenerative changes.   Electronically Signed   By: Lovey Newcomer M.D.   On: 04/16/2015 17:12    PMH:   Past Medical History  Diagnosis Date  . Urinary frequency   . Routine general medical examination at a health care facility   . Other atopic dermatitis and related conditions   . Rash and other nonspecific skin eruption   . Other anxiety states   . Insomnia, unspecified   . PVD (peripheral vascular disease)   . Unspecified vitamin D deficiency   . Anemia, unspecified   . Obesity, unspecified   . Nocturia   . Pain in joint,  lower leg   . Other and unspecified hyperlipidemia   . Unspecified essential hypertension   . Cataract   . CHF (congestive heart failure)   . Thrombocytopenia   . Chronic kidney disease, stage II (mild)     [facility]  . Type II or unspecified type diabetes mellitus with renal manifestations, not stated as uncontrolled   . Memory loss 02/10/2015  . Dementia     forgetful, .  .  Failure to thrive in adult 03/09/2015    PSH:   Past Surgical History  Procedure Laterality Date  . Abdominal hysterectomy  1970  . Breast surgery  1979    nodule removed  . Eye surgery  2004    catarcts removed from right eye.  . Multiple tooth extractions  09/2013    Remonve remaining 3 teeth, no with dentures   . Av fistula placement Left 09/06/2014    Procedure: CREATION OF LEFT UPPER ARM ARTERIOVENOUS (AV) FISTULA ;  Surgeon: Mal Misty, MD;  Location: [facility];  Service: Vascular;  Laterality: Left;  . Insertion of dialysis catheter N/A 09/06/2014    Procedure: INSERTION OF DIALYSIS CATHETER RIGHT INTERNAL JUGULAR VEIN;  Surgeon: Mal Misty, MD;  Location: [place];  Service: Vascular;  Laterality: N/A;  . Ligation of competing branches of arteriovenous fistula Left 09/06/2014    Procedure: LIGATION OF COMPETING BRANCHES OF ARTERIOVENOUS FISTULA;  Surgeon: Mal Misty, MD;  Location: [place];  Service: Vascular;  Laterality: Left;  . Left heart catheterization with coronary angiogram N/A 09/17/2014    Procedure: LEFT HEART CATHETERIZATION WITH CORONARY ANGIOGRAM;  Surgeon: Troy Sine, MD;  Location: [facility] CATH LAB;  Service: Cardiovascular;  Laterality: N/A;  . False aneurysm repair Left 03/07/2015    Procedure: REPAIR PSEUDOANEURYSMS OF LEFT ARM ARTERIOVENOUS FISTULA;  Surgeon: Angelia Mould, MD;  Location: [place];  Service: Vascular;  Laterality: Left;    Allergies:  Allergies  Allergen Reactions  . Heparin Other (See Comments)    Positive Hep Induced Plt Ab and SRA 09/12/2014    Medications:   Prior to Admission medications   Medication Sig Start Date End Date Taking? Authorizing Provider  acetaminophen (TYLENOL) 500 MG tablet Take 500 mg by mouth every 6 (six) hours as needed for mild pain.    Yes Historical Provider, MD  amLODipine (NORVASC) 10 MG tablet Take 10 mg by mouth at bedtime.   Yes Historical Provider, MD  carvedilol (COREG) 12.5 MG tablet One twice daily  to strengthen the heart and control BP Patient taking differently: Take 12.5 mg by mouth 2 (two) times daily with a meal. One twice daily to strengthen the heart and control BP 02/12/15  Yes Estill Dooms, MD  ENSURE (ENSURE) Take 237 mLs by mouth 3 (three) times daily between meals.   Yes Historical Provider, MD  fluocinonide cream (LIDEX) 5.45 % Apply 1 application topically 2 (two) times daily. Apply from neck down twice a day (provided by dermatology).   Yes Historical Provider, MD  hydrALAZINE (APRESOLINE) 25 MG tablet Take 1 tablet (25 mg total) by mouth every 8 (eight) hours. 11/07/14  Yes Estill Dooms, MD  lidocaine-prilocaine (EMLA) cream Apply 1 application topically daily as needed. Only use on dialysis days per patient.   Yes Historical Provider, MD  mirtazapine (REMERON) 15 MG tablet Take 15 mg by mouth at bedtime.   Yes Historical Provider, MD  multivitamin (RENA-VIT) TABS tablet Take 1 tablet by mouth  at bedtime. 03/15/15  Yes Velvet Bathe, MD  Nutritional Supplements (FEEDING SUPPLEMENT, NEPRO CARB STEADY,) LIQD Take 237 mLs by mouth as needed (missed meal during dialysis.). 03/15/15  Yes Velvet Bathe, MD  sertraline (ZOLOFT) 50 MG tablet Take 1 tablet (50 mg total) by mouth daily. 02/12/15  Yes Estill Dooms, MD  glucose blood (BAYER CONTOUR NEXT TEST) test strip Use as instructed 11/27/12   Tivis Ringer, RPH-CPP    Discontinued Meds:  There are no discontinued medications.  Social History:  reports that she quit smoking about 14 years ago. Her smoking use included Cigarettes. She has never used smokeless tobacco. She reports that she does not drink alcohol or use illicit drugs.  Family History:   Family History  Problem Relation Age of Onset  . Diabetes Mother     Blood pressure 164/61, pulse 63, temperature 97.5 F (36.4 C), temperature source Oral, resp. rate 16, SpO2 99 %. General appearance: alert and cooperative Head: Normocephalic, without obvious abnormality,  atraumatic Eyes: negative Neck: no adenopathy, no carotid bruit, no JVD, supple, symmetrical, trachea midline and thyroid not enlarged, symmetric, no tenderness/mass/nodules Back: negative, symmetric, no curvature. ROM normal. No CVA tenderness. Resp: clear to auscultation bilaterally Chest wall: no tenderness Cardio: regular rate and rhythm, S1, S2 normal, no murmur, click, rub or gallop GI: soft, non-tender; bowel sounds normal; no masses,  no organomegaly Extremities: extremities normal, atraumatic, no cyanosis or edema Pulses: 2+ and symmetric Skin: Skin color, texture, turgor normal. No rashes or lesions Lymph nodes: Cervical, supraclavicular, and axillary nodes normal. Neurologic: Grossly normal  Left BCF with a positive bruit but moderately pulsatile, RIJ TC       LIN, Hunt Oris, MD 04/16/2015, 9:56 PM

## 2015-04-16 NOTE — ED Notes (Signed)
CBG - 141 ° °

## 2015-04-16 NOTE — ED Notes (Addendum)
CBG 37.  Dr Madilyn Hook notified. D50 ordered.  Hospitalist at bedside

## 2015-04-17 DIAGNOSIS — E162 Hypoglycemia, unspecified: Secondary | ICD-10-CM

## 2015-04-17 DIAGNOSIS — I10 Essential (primary) hypertension: Secondary | ICD-10-CM

## 2015-04-17 DIAGNOSIS — N186 End stage renal disease: Secondary | ICD-10-CM

## 2015-04-17 DIAGNOSIS — E11649 Type 2 diabetes mellitus with hypoglycemia without coma: Secondary | ICD-10-CM | POA: Diagnosis not present

## 2015-04-17 DIAGNOSIS — E43 Unspecified severe protein-calorie malnutrition: Secondary | ICD-10-CM

## 2015-04-17 DIAGNOSIS — D638 Anemia in other chronic diseases classified elsewhere: Secondary | ICD-10-CM

## 2015-04-17 LAB — GLUCOSE, CAPILLARY
GLUCOSE-CAPILLARY: 153 mg/dL — AB (ref 65–99)
GLUCOSE-CAPILLARY: 178 mg/dL — AB (ref 65–99)
GLUCOSE-CAPILLARY: 185 mg/dL — AB (ref 65–99)
GLUCOSE-CAPILLARY: 37 mg/dL — AB (ref 65–99)
GLUCOSE-CAPILLARY: 39 mg/dL — AB (ref 65–99)
GLUCOSE-CAPILLARY: 76 mg/dL (ref 65–99)
GLUCOSE-CAPILLARY: 91 mg/dL (ref 65–99)
GLUCOSE-CAPILLARY: 95 mg/dL (ref 65–99)
Glucose-Capillary: 54 mg/dL — ABNORMAL LOW (ref 65–99)
Glucose-Capillary: 71 mg/dL (ref 65–99)
Glucose-Capillary: 142 mg/dL — ABNORMAL HIGH (ref 65–99)
Glucose-Capillary: 163 mg/dL — ABNORMAL HIGH (ref 65–99)
Glucose-Capillary: 151 mg/dL — ABNORMAL HIGH (ref 65–99)
Glucose-Capillary: 119 mg/dL — ABNORMAL HIGH (ref 65–99)
Glucose-Capillary: 99 mg/dL (ref 65–99)
Glucose-Capillary: 189 mg/dL — ABNORMAL HIGH (ref 65–99)
Glucose-Capillary: 139 mg/dL — ABNORMAL HIGH (ref 65–99)

## 2015-04-17 LAB — CBC
HEMATOCRIT: 34 % — AB (ref 36.0–46.0)
HEMATOCRIT: 35.2 % — AB (ref 36.0–46.0)
Hemoglobin: 10.9 g/dL — ABNORMAL LOW (ref 12.0–15.0)
Hemoglobin: 11 g/dL — ABNORMAL LOW (ref 12.0–15.0)
MCH: 27.7 pg (ref 26.0–34.0)
MCH: 28.6 pg (ref 26.0–34.0)
MCHC: 31 g/dL (ref 30.0–36.0)
MCHC: 32.4 g/dL (ref 30.0–36.0)
MCV: 88.5 fL (ref 78.0–100.0)
MCV: 89.3 fL (ref 78.0–100.0)
PLATELETS: 141 10*3/uL — AB (ref 150–400)
Platelets: 140 10*3/uL — ABNORMAL LOW (ref 150–400)
RBC: 3.84 MIL/uL — ABNORMAL LOW (ref 3.87–5.11)
RBC: 3.94 MIL/uL (ref 3.87–5.11)
RDW: 16 % — AB (ref 11.5–15.5)
RDW: 16.1 % — AB (ref 11.5–15.5)
WBC: 5.3 10*3/uL (ref 4.0–10.5)
WBC: 5.3 10*3/uL (ref 4.0–10.5)

## 2015-04-17 LAB — RENAL FUNCTION PANEL
ALBUMIN: 2.5 g/dL — AB (ref 3.5–5.0)
Anion gap: 10 (ref 5–15)
BUN: 38 mg/dL — AB (ref 6–20)
CALCIUM: 8.6 mg/dL — AB (ref 8.9–10.3)
CO2: 25 mmol/L (ref 22–32)
Chloride: 98 mmol/L — ABNORMAL LOW (ref 101–111)
Creatinine, Ser: 5.73 mg/dL — ABNORMAL HIGH (ref 0.44–1.00)
GFR calc Af Amer: 7 mL/min — ABNORMAL LOW (ref >60)
GFR calc non Af Amer: 6 mL/min — ABNORMAL LOW (ref >60)
GLUCOSE: 75 mg/dL (ref 65–99)
PHOSPHORUS: 3.1 mg/dL (ref 2.5–4.6)
Potassium: 4 mmol/L (ref 3.5–5.1)
SODIUM: 133 mmol/L — AB (ref 135–145)

## 2015-04-17 LAB — COMPREHENSIVE METABOLIC PANEL
ALBUMIN: 2.4 g/dL — AB (ref 3.5–5.0)
ALK PHOS: 79 U/L (ref 38–126)
ALT: 19 U/L (ref 14–54)
AST: 26 U/L (ref 15–41)
Anion gap: 11 (ref 5–15)
BILIRUBIN TOTAL: 0.6 mg/dL (ref 0.3–1.2)
BUN: 39 mg/dL — AB (ref 6–20)
CALCIUM: 8.6 mg/dL — AB (ref 8.9–10.3)
CO2: 28 mmol/L (ref 22–32)
Chloride: 95 mmol/L — ABNORMAL LOW (ref 101–111)
Creatinine, Ser: 5.98 mg/dL — ABNORMAL HIGH (ref 0.44–1.00)
GFR calc Af Amer: 7 mL/min — ABNORMAL LOW (ref >60)
GFR, EST NON AFRICAN AMERICAN: 6 mL/min — AB (ref >60)
GLUCOSE: 184 mg/dL — AB (ref 65–99)
POTASSIUM: 4 mmol/L (ref 3.5–5.1)
Sodium: 134 mmol/L — ABNORMAL LOW (ref 135–145)
TOTAL PROTEIN: 6.2 g/dL — AB (ref 6.5–8.1)

## 2015-04-17 LAB — MAGNESIUM: MAGNESIUM: 2.1 mg/dL (ref 1.7–2.4)

## 2015-04-17 LAB — PHOSPHORUS: PHOSPHORUS: 3.1 mg/dL (ref 2.5–4.6)

## 2015-04-17 LAB — TSH: TSH: 2.731 u[IU]/mL (ref 0.350–4.500)

## 2015-04-17 LAB — VITAMIN B12: VITAMIN B 12: 526 pg/mL (ref 180–914)

## 2015-04-17 LAB — C DIFFICILE QUICK SCREEN W PCR REFLEX
C DIFFICILE (CDIFF) INTERP: NEGATIVE
C DIFFICILE (CDIFF) TOXIN: NEGATIVE
C Diff antigen: NEGATIVE

## 2015-04-17 LAB — PREALBUMIN: PREALBUMIN: 18.9 mg/dL (ref 18–38)

## 2015-04-17 LAB — FOLATE: FOLATE: 24.6 ng/mL (ref >5.9)

## 2015-04-17 MED ORDER — DEXTROSE 10 % IV SOLN
INTRAVENOUS | Status: DC
  Filled 2015-04-17: qty 1000

## 2015-04-17 MED ORDER — VITAMIN B-1 100 MG PO TABS
100.0000 mg | ORAL_TABLET | Freq: Every day | ORAL | Status: DC
  Administered 2015-04-17 – 2015-04-18 (×2): 100 mg via ORAL
  Filled 2015-04-17 (×2): qty 1

## 2015-04-17 MED ORDER — NEPRO/CARBSTEADY PO LIQD
237.0000 mL | Freq: Three times a day (TID) | ORAL | Status: DC
  Administered 2015-04-17 – 2015-04-18 (×3): 237 mL via ORAL

## 2015-04-17 NOTE — Progress Notes (Signed)
Utilization review completed. Cherril Hett, RN, BSN. 

## 2015-04-17 NOTE — Progress Notes (Addendum)
CBG orders are to obtain Q2H's until 3 CBG's are >90. Patient's last 3 CBG's are 142, 185, and 163. But there are also orders to stop IVF with D10. Dr. Barnie Del consulted with RN and new plan is to recheck CBG at 1000 and then IVF restart will be determined by new CBG results.   Leanna Battles, RN.   1014 Addendum - CBG 151. Dr. Barnie Del notified - new orders to recheck CBG in one hour. If >120, then can begin checking CBG Q4H.   1240 Addendum - CBG 95 at 1143. Dr. Barnie Del notified. Stated to give her scheduled nepro since she was already in HD and missed lunch and to continue holding IVF.   1658 Addendum - CBG's 91, 119, and 99. Dr. Barnie Del notified. New orders received.  Leanna Battles, RN.

## 2015-04-17 NOTE — Progress Notes (Signed)
[facility] PROGRESS NOTE  Natalie Wolfe WUJ:[id number] DOB: April 04, 1930 DOA: 04/16/2015  PCP: [name], MD  Brief HPI: 81 year old African-American female with a past medical history of end-stage renal disease on hemodialysis, anemia of chronic disease, congestive heart failure, essential hypertension, presented with low blood sugars. She was apparently found on the floor of her house by family members. Blood sugar was low on evaluation. She was hospitalized for further management.  Past medical history:  Past Medical History  Diagnosis Date  . Urinary frequency   . Routine general medical examination at a health care facility   . Other atopic dermatitis and related conditions   . Rash and other nonspecific skin eruption   . Other anxiety states   . Insomnia, unspecified   . PVD (peripheral vascular disease)   . Unspecified vitamin D deficiency   . Anemia, unspecified   . Obesity, unspecified   . Nocturia   . Pain in joint, lower leg   . Other and unspecified hyperlipidemia   . Unspecified essential hypertension   . Cataract   . CHF (congestive heart failure)   . Thrombocytopenia   . Chronic kidney disease, stage II (mild)     [facility]  . Type II or unspecified type diabetes mellitus with renal manifestations, not stated as uncontrolled   . Memory loss 02/10/2015  . Dementia     forgetful, .  . Failure to thrive in adult 03/09/2015    Consultants: Nephrology  Procedures: Hemodialysis  Antibiotics: None  Subjective: Patient feels well this morning. She states that the last time she took her diabetic medications, was 3 days ago. She states that she has had a poor appetite and poor oral intake for the past few days. She denies any abdominal pain. She does mention diarrhea on and off for the past few days. No nausea, vomiting.  Objective: Vital Signs  Filed Vitals:   04/16/15 2130 04/16/15 2228 04/17/15 0026 04/17/15 0439    BP: 164/61 167/54 152/63 133/47  Pulse: 63 63 64 63  Temp:  98 F (36.7 C) 98.4 F (36.9 C) 98.6 F (37 C)  TempSrc:  Oral Oral Oral  Resp: 16 17 18 17   Height:  5\' 5"  (1.651 m)    Weight:  45.904 kg (101 lb 3.2 oz)    SpO2: 99% 98% 97% 97%    Intake/Output Summary (Last 24 hours) at 04/17/15 0925 Last data filed at 04/17/15 0900  Gross per 24 hour  Intake 463.33 ml  Output      0 ml  Net 463.33 ml   Filed Weights   04/16/15 2228  Weight: 45.904 kg (101 lb 3.2 oz)    General appearance: alert, cooperative, appears stated age and no distress Resp: clear to auscultation bilaterally Cardio: regular rate and rhythm, S1, S2 normal, no murmur, click, rub or gallop GI: soft, non-tender; bowel sounds normal; no masses,  no organomegaly Extremities: extremities normal, atraumatic, no cyanosis or edema Neurologic: Alert and oriented 3. No facial symmetry. Motor strength equal bilateral upper and lower extremities.  Lab Results:  Basic Metabolic Panel:  Recent Labs Lab 04/16/15 1628 04/16/15 2335 04/17/15 0555  NA 134* 133* 134*  K 4.2 4.0 4.0  CL 100* 98* 95*  CO2 20* 25 28  GLUCOSE 84 75 184*  BUN 35* 38* 39*  CREATININE 5.21* 5.73* 5.98*  CALCIUM 8.4* 8.6* 8.6*  MG  --   --  2.1  PHOS  --  3.1  3.1   Liver Function Tests:  Recent Labs Lab 04/16/15 1628 04/16/15 2335 04/17/15 0555  AST 36  --  26  ALT 20  --  19  ALKPHOS 78  --  79  BILITOT 0.3  --  0.6  PROT 6.2*  --  6.2*  ALBUMIN 2.5* 2.5* 2.4*   CBC:  Recent Labs Lab 04/16/15 1732 04/16/15 2335 04/17/15 0555  WBC 5.3 5.3 5.3  NEUTROABS 3.3  --   --   HGB 11.4* 11.0* 10.9*  HCT 37.6 34.0* 35.2*  MCV 89.5 88.5 89.3  PLT 153 140* 141*    CBG:  Recent Labs Lab 04/17/15 0021 04/17/15 0201 04/17/15 0433 04/17/15 0634 04/17/15 0859  GLUCAP 76 71 142* 185* 163*    No results found for this or any previous visit (from the past 240 hour(s)).    Studies/Results: Dg Chest 2  View  04/16/2015   CLINICAL DATA:  Weakness and recent fall  EXAM: CHEST - 2 VIEW  COMPARISON:  12/24/2014  FINDINGS: Cardiac shadow is stable. A dialysis catheter is again noted on the right. Mild central vascular prominence is seen likely related to the underlying renal failure. No significant pulmonary edema is noted. No focal infiltrate is noted. The bony structures are within normal limits.  IMPRESSION: Stable central vascular prominence without pulmonary edema. No acute abnormality is noted.   Electronically Signed   By: Alcide Clever M.D.   On: 04/16/2015 17:11   Dg Knee Complete 4 Views Right  04/16/2015   CLINICAL DATA:  Patient status post fall. Right knee pain and bruising. Initial encounter.  EXAM: RIGHT KNEE - COMPLETE 4+ VIEW  COMPARISON:  None.  FINDINGS: Medial compartment joint space loss and osteophytosis. Lateral view is limited secondary to patient positioning. No definite joint effusion identified. No evidence for acute fracture dislocation. Vascular calcifications.  IMPRESSION: No evidence for acute osseous abnormality.  Medial compartment degenerative changes.   Electronically Signed   By: Annia Belt M.D.   On: 04/16/2015 17:12    Medications:  Scheduled: . amLODipine  10 mg Oral QHS  . carvedilol  12.5 mg Oral BID WC  . feeding supplement (NEPRO CARB STEADY)  237 mL Oral TID BM  . hydrALAZINE  25 mg Oral 3 times per day  . mirtazapine  15 mg Oral QHS  . sertraline  50 mg Oral Daily  . sodium chloride  3 mL Intravenous Q12H  . thiamine  100 mg Oral Daily   Continuous: . dextrose 10 % 1,000 mL infusion Stopped (04/17/15 1236)   ZOX:WRUEAV chloride, sodium chloride, acetaminophen **OR** acetaminophen, alteplase, HYDROcodone-acetaminophen, lidocaine (PF), lidocaine-prilocaine, ondansetron **OR** ondansetron (ZOFRAN) IV, pentafluoroprop-tetrafluoroeth  Assessment/Plan:  Active Problems:   DM type 2, uncontrolled, with renal complications   Essential hypertension   Anemia  of chronic disease   ESRD (end stage renal disease)   Congestive dilated cardiomyopathy-EF 40-45% at cath, 35% by echo   Anorexia   Protein-calorie malnutrition, severe   Hypoglycemia    Hypoglycemia Etiology remains unclear, but could be due to poor oral intake. She also reports poor appetite. Denies symptoms suggestive of early satiety. She does admit to losing weight over the past few weeks. Abdomen is benign. She was started on D10 infusion. CBGs are better. Continue to monitor closely. Wean her off of D10. Check cortisol level in the morning. Hold glucose lowering agents. HbA1c is pending.  Anorexia Etiology unclear. Could be due to diarrhea. Continue to monitor for now. TSH  is normal  Diarrhea Etiology is unclear. Stool studies have been ordered.  End-stage renal disease on hemodialysis Patient is dialyzed on Tuesday, Thursday, Saturday. Nephrology is following. To be dialyzed today.  Anemia of chronic disease Hemoglobin is stable. Continue to monitor  History of congestive dilated cardiomyopathy EF is between 35-45% based on echocardiogram and cardiac catheterization. Fluid management with dialysis. Continue beta blockers.  History of essential hypertension Continue to monitor blood pressures. Continue home medications.  Severe protein calorie Malnutrition Dietitian to evaluate.  DVT Prophylaxis: SCDs    Code Status: Full code  Family Communication: Discussed with the patient. No family at bedside  Disposition Plan: She lives by herself. PT and OT to evaluate. Await stabilization of glucose levels.     LOS: 1 day   [facility]  [facility] Pager [phone] 04/17/2015, 9:25 AM  If 7PM-7AM, please contact night-coverage at [email or web address], password [email or web address]

## 2015-04-17 NOTE — Progress Notes (Signed)
Initial Nutrition Assessment  DOCUMENTATION CODES:   Underweight, Severe malnutrition in context of chronic illness  INTERVENTION:   Nepro Shake po TID, each supplement provides 425 kcal and 19 grams protein  NUTRITION DIAGNOSIS:   Increased nutrient needs related to chronic illness as evidenced by estimated needs   GOAL:   Patient will meet greater than or equal to 90% of their needs  MONITOR:   PO intake, Supplement acceptance, Labs, Weight trends, I & O's  REASON FOR ASSESSMENT:   Consult Assessment of nutrition requirement/status  ASSESSMENT:   82 year old Female with a PMH of end-stage renal disease on hemodialysis, anemia of chronic disease, congestive heart failure, essential hypertension, presented with low blood sugars. She was apparently found on the floor of her house by family members. Blood sugar was low on evaluation. She was hospitalized for further management.  Pt seen in HEMODIALYSIS.  Watching her TV.  Reports her appetite hasn't been very good.  Seen per Clinical Nutrition during previous hospitalization.  Likes Nepro Shake supplements.  Is currently drinking during HD session.  Nutrition-Focused physical exam completed. Findings are severe fat depletion, severe muscle depletion, and no edema.   Diet Order:  Diet renal/carb modified with fluid restriction Diet-HS Snack?: Nothing; Room service appropriate?: Yes; Fluid consistency:: Thin  Skin:  Reviewed, no issues  Last BM:  9/8  Height:   Ht Readings from Last 1 Encounters:  04/16/15  (1.651 m)    Weight:   Wt Readings from Last 1 Encounters:  04/17/15 107 lb 2.3 oz (48.6 kg)    Ideal Body Weight:  56.8 kg  BMI:  Body mass index is 17.83 kg/(m^2).  Estimated Nutritional Needs:   Kcal:  1200-1400  Protein:  65-75 gm  Fluid:  1200 ml  EDUCATION NEEDS:   No education needs identified at this time  Maureen Chatters, RD, LDN Pager #: [phone] After-Hours Pager #: [phone]

## 2015-04-17 NOTE — Progress Notes (Signed)
Hemodialysis- CBG 91 at 1247. Pt drinking Nepro, continue to monitor

## 2015-04-17 NOTE — Progress Notes (Signed)
Hemodialysis- pt tolerated procedure well. Used 17g needle in avf x1 per orders without issue. UF 1L. Cramping in stomach last 30 minutes of treatment relieved with saline. No other complaints.

## 2015-04-17 NOTE — Evaluation (Signed)
Physical Therapy Evaluation Patient Details Name: Natalie Wolfe MRN: [id number] DOB: [birth year] Today's Date: 04/17/2015   History of Present Illness  Patient is an 81 yo female admitted 04/16/15 with anemia and hypoglycemia, found on floor by family.   PMH:  PVD, anxiety, anemia, HTN, CHF, CKD on HD, DM, dementia, FTT, CM with EF 35%  Clinical Impression  Patient presents with general weakness impacting mobility.  Will benefit from acute PT to maximize functional independence prior to discharge home.    Follow Up Recommendations No PT follow up;Supervision - Intermittent    Equipment Recommendations  None recommended by PT    Recommendations for Other Services       Precautions / Restrictions Precautions Precautions: None Restrictions Weight Bearing Restrictions: No      Mobility  Bed Mobility Overal bed mobility: Modified Independent                Transfers Overall transfer level: Needs assistance Equipment used: None Transfers: Sit to/from Stand Sit to Stand: Supervision         General transfer comment: Supervision for safety only.  Ambulation/Gait Ambulation/Gait assistance: Supervision Ambulation Distance (Feet): 86 Feet Assistive device: None Gait Pattern/deviations: Step-through pattern;Decreased stride length Gait velocity: decreased Gait velocity interpretation: Below normal speed for age/gender General Gait Details: Patient able to ambulate with no assistive device with good balance.  Patient uses cane at home/outside of apartment.  Stairs            Wheelchair Mobility    Modified Rankin (Stroke Patients Only)       Balance Overall balance assessment: No apparent balance deficits (not formally assessed)                                           Pertinent Vitals/Pain Pain Assessment: No/denies pain    Home Living Family/patient expects to be discharged to:: Private residence Living Arrangements:  Alone Available Help at Discharge: Family;Available PRN/intermittently Type of Home: Apartment Home Access: Elevator     Home Layout: One level Home Equipment: Walker - 2 wheels;Cane - single point;Bedside commode      Prior Function Level of Independence: Independent with assistive device(s)         Comments: Uses cane for ambulation.  [facility] for transportation to HD.     Hand Dominance   Dominant Hand: Right    Extremity/Trunk Assessment   Upper Extremity Assessment: Generalized weakness           Lower Extremity Assessment: Generalized weakness         Communication   Communication: No difficulties  Cognition Arousal/Alertness: Awake/alert Behavior During Therapy: WFL for tasks assessed/performed Overall Cognitive Status: Within Functional Limits for tasks assessed                      General Comments      Exercises        Assessment/Plan    PT Assessment Patient needs continued PT services  PT Diagnosis Generalized weakness   PT Problem List Decreased strength;Decreased mobility;Decreased knowledge of use of DME  PT Treatment Interventions DME instruction;Gait training;Functional mobility training;Therapeutic activities;Patient/family education   PT Goals (Current goals can be found in the Care Plan section) Acute Rehab PT Goals Patient Stated Goal: To return to apartment PT Goal Formulation: With patient Time For Goal Achievement: 04/24/15 Potential to Achieve Goals: Good  Frequency Min 3X/week   Barriers to discharge Decreased caregiver support Patient lives alone.  Family available prn.    Co-evaluation               End of Session   Activity Tolerance: Patient tolerated treatment well Patient left: in bed;with call bell/phone within reach Nurse Communication: Mobility status         Time: 3086-5784 PT Time Calculation (min) (ACUTE ONLY): 10 min   Charges:   PT Evaluation $Initial PT Evaluation Tier I: 1  Procedure     PT G CodesVena Austria May 04, 2015, 7:04 PM Durenda Hurt. Renaldo Fiddler, [facility] Acute Rehab Services Pager [phone]

## 2015-04-17 NOTE — Progress Notes (Signed)
  [facility] Progress Note   Subjective: no complaints, feels a lot better with BS over 100  Filed Vitals:   04/17/15 1228 04/17/15 1235 04/17/15 1252 04/17/15 1324  BP: 157/73 156/77 167/72 167/70  Pulse: 68 64 63 64  Temp: 98.3 F (36.8 C)     TempSrc: Oral     Resp: 18     Height:      Weight: 48.6 kg (107 lb 2.3 oz)     SpO2: 98%      Exam: Alert thin elderly female, no distress No jvd Chest clear bilat RRR no MRG Abd soft ntnd no mass or ascites Ext no edema, no wounds or ulcers Neuro is alert, nf Ox 3 L arm BC fistula +bruit, RIJ TDC  TTS [place]  46.5kg dry wt   Using RIJ TDC (has AVF but not using       Assessment: 1. ESRD TTS hd. Has AVF ready to start using this week 2. Hypoglycemia, improved 3. PVD - stable. 4. HTN/ vol - BP's up , at dry wt, may need lower dry wt 5. CHF - EF 35% (dilated CM)  Plan - HD today , try to lower dry wt, start using AVF today (17 ga needles)    Vinson Moselle MD  pager [phone]    cell [phone]  04/17/2015, 1:31 PM     Recent Labs Lab 04/16/15 1628 04/16/15 2335 04/17/15 0555  NA 134* 133* 134*  K 4.2 4.0 4.0  CL 100* 98* 95*  CO2 20* 25 28  GLUCOSE 84 75 184*  BUN 35* 38* 39*  CREATININE 5.21* 5.73* 5.98*  CALCIUM 8.4* 8.6* 8.6*  PHOS  --  3.1 3.1    Recent Labs Lab 04/16/15 1628 04/16/15 2335 04/17/15 0555  AST 36  --  26  ALT 20  --  19  ALKPHOS 78  --  79  BILITOT 0.3  --  0.6  PROT 6.2*  --  6.2*  ALBUMIN 2.5* 2.5* 2.4*    Recent Labs Lab 04/16/15 1732 04/16/15 2335 04/17/15 0555  WBC 5.3 5.3 5.3  NEUTROABS 3.3  --   --   HGB 11.4* 11.0* 10.9*  HCT 37.6 34.0* 35.2*  MCV 89.5 88.5 89.3  PLT 153 140* 141*   . amLODipine  10 mg Oral QHS  . carvedilol  12.5 mg Oral BID WC  . feeding supplement (NEPRO CARB STEADY)  237 mL Oral TID BM  . hydrALAZINE  25 mg Oral 3 times per day  . mirtazapine  15 mg Oral QHS  . sertraline  50 mg Oral Daily  . sodium chloride  3 mL Intravenous  Q12H  . thiamine  100 mg Oral Daily   . dextrose 10 % 1,000 mL infusion Stopped (04/17/15 1236)   sodium chloride, sodium chloride, acetaminophen **OR** acetaminophen, alteplase, HYDROcodone-acetaminophen, lidocaine (PF), lidocaine-prilocaine, ondansetron **OR** ondansetron (ZOFRAN) IV, pentafluoroprop-tetrafluoroeth

## 2015-04-18 LAB — OVA AND PARASITE EXAMINATION: Ova and parasites: NONE SEEN

## 2015-04-18 LAB — GLUCOSE, CAPILLARY
GLUCOSE-CAPILLARY: 102 mg/dL — AB (ref 65–99)
Glucose-Capillary: 93 mg/dL (ref 65–99)
Glucose-Capillary: 106 mg/dL — ABNORMAL HIGH (ref 65–99)
Glucose-Capillary: 97 mg/dL (ref 65–99)

## 2015-04-18 LAB — BASIC METABOLIC PANEL
Anion gap: 8 (ref 5–15)
BUN: 22 mg/dL — AB (ref 6–20)
CALCIUM: 8.5 mg/dL — AB (ref 8.9–10.3)
CO2: 29 mmol/L (ref 22–32)
CREATININE: 4.54 mg/dL — AB (ref 0.44–1.00)
Chloride: 96 mmol/L — ABNORMAL LOW (ref 101–111)
GFR calc Af Amer: 9 mL/min — ABNORMAL LOW (ref >60)
GFR, EST NON AFRICAN AMERICAN: 8 mL/min — AB (ref >60)
GLUCOSE: 91 mg/dL (ref 65–99)
POTASSIUM: 4.5 mmol/L (ref 3.5–5.1)
SODIUM: 133 mmol/L — AB (ref 135–145)

## 2015-04-18 LAB — FECAL LACTOFERRIN, QUANT: FECAL LACTOFERRIN: NEGATIVE

## 2015-04-18 LAB — HEMOGLOBIN A1C
Hgb A1c MFr Bld: 5.2 % (ref 4.8–5.6)
MEAN PLASMA GLUCOSE: 103 mg/dL

## 2015-04-18 LAB — CORTISOL-AM, BLOOD: CORTISOL - AM: 15.8 ug/dL (ref 6.7–22.6)

## 2015-04-18 MED ORDER — LOPERAMIDE HCL 2 MG PO TABS: 2.0000 mg | ORAL_TABLET | Freq: Three times a day (TID) | ORAL | Status: DC | PRN

## 2015-04-18 MED ORDER — MEGESTROL ACETATE 400 MG/10ML PO SUSP
400.0000 mg | Freq: Every day | ORAL | Status: DC
  Administered 2015-04-18: 400 mg via ORAL
  Filled 2015-04-18: qty 10

## 2015-04-18 MED ORDER — MEGESTROL ACETATE 40 MG/ML PO SUSP: 400.0000 mg | Freq: Every day | ORAL | Status: DC

## 2015-04-18 NOTE — Discharge Instructions (Signed)
Hypoglycemia °Hypoglycemia occurs when the glucose in your blood is too low. Glucose is a type of sugar that is your body's main energy source. Hormones, such as insulin and glucagon, control the level of glucose in the blood. Insulin lowers blood glucose and glucagon increases blood glucose. Having too much insulin in your blood stream, or not eating enough food containing sugar, can result in hypoglycemia. Hypoglycemia can happen to people with or without diabetes. It can develop quickly and can be a medical emergency.  °CAUSES  °· Missing or delaying meals. °· Not eating enough carbohydrates at meals. °· Taking too much diabetes medicine. °· Not timing your oral diabetes medicine or insulin doses with meals, snacks, and exercise. °· Nausea and vomiting. °· Certain medicines. °· Severe illnesses, such as hepatitis, kidney disorders, and certain eating disorders. °· Increased activity or exercise without eating something extra or adjusting medicines. °· Drinking too much alcohol. °· A nerve disorder that affects body functions like your heart rate, blood pressure, and digestion (autonomic neuropathy). °· A condition where the stomach muscles do not function properly (gastroparesis). Therefore, medicines and food may not absorb properly. °· Rarely, a tumor of the pancreas can produce too much insulin. °SYMPTOMS  °· Hunger. °· Sweating (diaphoresis). °· Change in body temperature. °· Shakiness. °· Headache. °· Anxiety. °· Lightheadedness. °· Irritability. °· Difficulty concentrating. °· Dry mouth. °· Tingling or numbness in the hands or feet. °· Restless sleep or sleep disturbances. °· Altered speech and coordination. °· Change in mental status. °· Seizures or prolonged convulsions. °· Combativeness. °· Drowsiness (lethargic). °· Weakness. °· Increased heart rate or palpitations. °· Confusion. °· Pale, gray skin color. °· Blurred or double vision. °· Fainting. °DIAGNOSIS  °A physical exam and medical history will be  performed. Your caregiver may make a diagnosis based on your symptoms. Blood tests and other lab tests may be performed to confirm a diagnosis. Once the diagnosis is made, your caregiver will see if your signs and symptoms go away once your blood glucose is raised.  °TREATMENT  °Usually, you can easily treat your hypoglycemia when you notice symptoms. °· Check your blood glucose. If it is less than 70 mg/dl, take one of the following:   °¨ 3-4 glucose tablets.   °¨ ½ cup juice.   °¨ ½ cup regular soda.   °¨ 1 cup skim milk.   °¨ ½-1 tube of glucose gel.   °¨ 5-6 hard candies.   °· Avoid high-fat drinks or food that may delay a rise in blood glucose levels. °· Do not take more than the recommended amount of sugary foods, drinks, gel, or tablets. Doing so will cause your blood glucose to go too high.   °· Wait 10-15 minutes and recheck your blood glucose. If it is still less than 70 mg/dl or below your target range, repeat treatment.   °· Eat a snack if it is more than 1 hour until your next meal.   °There may be a time when your blood glucose may go so low that you are unable to treat yourself at home when you start to notice symptoms. You may need someone to help you. You may even faint or be unable to swallow. If you cannot treat yourself, someone will need to bring you to the hospital.  °HOME CARE INSTRUCTIONS °· If you have diabetes, follow your diabetes management plan by: °¨ Taking your medicines as directed. °¨ Following your exercise plan. °¨ Following your meal plan. Do not skip meals. Eat on time. °¨ Testing your blood   glucose regularly. Check your blood glucose before and after exercise. If you exercise longer or different than usual, be sure to check blood glucose more frequently. °¨ Wearing your medical alert jewelry that says you have diabetes. °· Identify the cause of your hypoglycemia. Then, develop ways to prevent the recurrence of hypoglycemia. °· Do not take a hot bath or shower right after an  insulin shot. °· Always carry treatment with you. Glucose tablets are the easiest to carry. °· If you are going to drink alcohol, drink it only with meals. °· Tell friends or family members ways to keep you safe during a seizure. This may include removing hard or sharp objects from the area or turning you on your side. °· Maintain a healthy weight. °SEEK MEDICAL CARE IF:  °· You are having problems keeping your blood glucose in your target range. °· You are having frequent episodes of hypoglycemia. °· You feel you might be having side effects from your medicines. °· You are not sure why your blood glucose is dropping so low. °· You notice a change in vision or a new problem with your vision. °SEEK IMMEDIATE MEDICAL CARE IF:  °· Confusion develops. °· A change in mental status occurs. °· The inability to swallow develops. °· Fainting occurs. °Document Released: 07/26/2005 Document Revised: 07/31/2013 Document Reviewed: 11/22/2011 °[employer]® Patient Information ©2015 [employer]. This information is not intended to replace advice given to you by your health care provider. Make sure you discuss any questions you have with your health care provider. ° °

## 2015-04-18 NOTE — Progress Notes (Signed)
Subjective:   Feels well this AM- waiting for family to bring her dentures so she can eat  Objective Filed Vitals:   04/17/15 1652 04/17/15 2011 04/18/15 0457 04/18/15 0812  BP: 163/74 158/63 162/56 155/55  Pulse: 75 69 64 61  Temp: 98.2 F (36.8 C) 99.4 F (37.4 C) 98.9 F (37.2 C) 98.7 F (37.1 C)  TempSrc: Oral Oral Oral Oral  Resp: 18 17 16 17   Height:      Weight:  47.9 kg (105 lb 9.6 oz)    SpO2: 98% 96% 98% 97%   Physical Exam General: alert and oriented. No acute distress.  Heart: RRR  Lungs: CTA, unlabored  Abdomen: thin, soft, nontender +BS  Extremities: no edema Dialysis Access:  L AVF +b/t  R IJ  TTS [facility] 46.5kg dry wt Using RIJ TDC   Assessment/Plan: 1. hypoglycemia- better- A1c 5.2 2. ESRD - TTS- HD pending tomorrow. Used 1 17 g needle in AVF yesterday- cont to use.  3. Anemia - hgb 10.9 yesterday. WatchCBC 4. Secondary hyperparathyroidism - phos 3.1 5. HTN/volume - BP elevated- on norvasc, coreg and hydralazine. Lower edw/ likely losing body mass. 6. Nutrition - alb 2.4-  Megace. Renal diet 7. Dispo- Dc today  Jetty Duhamel, NP [facility] Beeper [phone] 04/18/2015,10:24 AM  LOS: 2 days   Pt seen, examined and agree w A/P as above.  Vinson Moselle MD pager [phone]    cell [phone] 04/18/2015, 1:05 PM     Additional Objective Labs: Basic Metabolic Panel:  Recent Labs Lab 04/16/15 2335 04/17/15 0555 04/18/15 0728  NA 133* 134* 133*  K 4.0 4.0 4.5  CL 98* 95* 96*  CO2 25 28 29   GLUCOSE 75 184* 91  BUN 38* 39* 22*  CREATININE 5.73* 5.98* 4.54*  CALCIUM 8.6* 8.6* 8.5*  PHOS 3.1 3.1  --    Liver Function Tests:  Recent Labs Lab 04/16/15 1628 04/16/15 2335 04/17/15 0555  AST 36  --  26  ALT 20  --  19  ALKPHOS 78  --  79  BILITOT 0.3  --  0.6  PROT 6.2*  --  6.2*  ALBUMIN 2.5* 2.5* 2.4*   No results for input(s): LIPASE, AMYLASE in the last 168 hours. CBC:  Recent Labs Lab 04/16/15 1732 04/16/15 2335  04/17/15 0555  WBC 5.3 5.3 5.3  NEUTROABS 3.3  --   --   HGB 11.4* 11.0* 10.9*  HCT 37.6 34.0* 35.2*  MCV 89.5 88.5 89.3  PLT 153 140* 141*   Blood Culture    Component Value Date/Time   SDES STOOL 04/17/2015 0846   SDES STOOL 04/17/2015 0846   SPECREQUEST NONE 04/17/2015 0846   SPECREQUEST Normal 04/17/2015 0846   CULT  04/17/2015 0846    Culture reincubated for better growth Performed at [facility]    REPTSTATUS 04/18/2015 FINAL 04/17/2015 0846   REPTSTATUS PENDING 04/17/2015 0846    Cardiac Enzymes: No results for input(s): CKTOTAL, CKMB, CKMBINDEX, TROPONINI in the last 168 hours. CBG:  Recent Labs Lab 04/17/15 2159 04/18/15 0245 04/18/15 0606 04/18/15 0808 04/18/15 1006  GLUCAP 139* 93 106* 97 102*   Iron Studies: No results for input(s): IRON, TIBC, TRANSFERRIN, FERRITIN in the last 72 hours. @lablastinr3 @ Studies/Results: Dg Chest 2 View  04/16/2015   CLINICAL DATA:  Weakness and recent fall  EXAM: CHEST - 2 VIEW  COMPARISON:  12/24/2014  FINDINGS: Cardiac shadow is stable. A dialysis catheter is again noted on the right. Mild  central vascular prominence is seen likely related to the underlying renal failure. No significant pulmonary edema is noted. No focal infiltrate is noted. The bony structures are within normal limits.  IMPRESSION: Stable central vascular prominence without pulmonary edema. No acute abnormality is noted.   Electronically Signed   By: Alcide Clever M.D.   On: 04/16/2015 17:11   Dg Knee Complete 4 Views Right  04/16/2015   CLINICAL DATA:  Patient status post fall. Right knee pain and bruising. Initial encounter.  EXAM: RIGHT KNEE - COMPLETE 4+ VIEW  COMPARISON:  None.  FINDINGS: Medial compartment joint space loss and osteophytosis. Lateral view is limited secondary to patient positioning. No definite joint effusion identified. No evidence for acute fracture dislocation. Vascular calcifications.  IMPRESSION: No evidence for acute osseous  abnormality.  Medial compartment degenerative changes.   Electronically Signed   By: Annia Belt M.D.   On: 04/16/2015 17:12   Medications:   . amLODipine  10 mg Oral QHS  . carvedilol  12.5 mg Oral BID WC  . feeding supplement (NEPRO CARB STEADY)  237 mL Oral TID BM  . hydrALAZINE  25 mg Oral 3 times per day  . megestrol  400 mg Oral Daily  . mirtazapine  15 mg Oral QHS  . sertraline  50 mg Oral Daily  . sodium chloride  3 mL Intravenous Q12H  . thiamine  100 mg Oral Daily

## 2015-04-18 NOTE — Progress Notes (Signed)
Patient discharge teaching given, including activity, diet, follow-up appoints, and medications. Patient verbalized understanding of all discharge instructions. IV access was d/c'd. Vitals are stable. Skin is intact except as charted in most recent assessments. Pt to be escorted out by NT, to be driven home by family.  Rexine Gowens, MBA, BS, RN 

## 2015-04-18 NOTE — Discharge Summary (Signed)
[facility]  Physician Discharge Summary   Patient ID: Natalie Wolfe MRN: [id number] DOB/AGE: [birth year] 81 y.o.  Admit date: 04/16/2015 Discharge date: 04/18/2015  PCP: [name], MD  DISCHARGE DIAGNOSES:  Active Problems:   DM type 2, uncontrolled, with renal complications   Essential hypertension   Anemia of chronic disease   ESRD (end stage renal disease)   Congestive dilated cardiomyopathy-EF 40-45% at cath, 35% by echo   Anorexia   Protein-calorie malnutrition, severe   Hypoglycemia   RECOMMENDATIONS FOR OUTPATIENT FOLLOW UP: 1. Patient asked to follow up with her primary care physician for further workup of her anorexia 2. She was asked to stop taking any medicine that could lower her blood sugar.   DISCHARGE CONDITION: fair  Diet recommendation: Regular for now  [facility] Weights   04/17/15 1228 04/17/15 1542 04/17/15 2011  Weight: 48.6 kg (107 lb 2.3 oz) 47.8 kg (105 lb 6.1 oz) 47.9 kg (105 lb 9.6 oz)    INITIAL HISTORY: 81 year old African-American female with a past medical history of end-stage renal disease on hemodialysis, anemia of chronic disease, congestive heart failure, essential hypertension, presented with low blood sugars. She was apparently found on the floor of her house by family members. Blood sugar was low on evaluation. She was hospitalized for further management.  Consultations:  Nephrology  Procedures:  Hemodialysis  HOSPITAL COURSE:   Hypoglycemia Etiology remains unclear, but could be due to poor oral intake. She also reports poor appetite. Denies symptoms suggestive of early satiety or difficulty swallowing. She does admit to losing weight over the past few weeks. Abdomen is benign. She was started on D10 infusion. CBGs improved. Subsequently taken off of D10. CBGs remained greater than 90. HbA1c is 5.2. She was asked to stop taking any of her diabetic medications. Cortisol level is normal.   Anorexia Etiology  unclear. She denies any symptoms of dysphagia, early satiety, abdominal pain. Did mention loose stools. Abdomen remains benign. She will be given a trial of Megace. But she has been told to follow-up with her PCP for further work up and anorexia and weight loss. TSH is normal  Diarrhea Etiology is unclear. Stool studies were negative including C. Difficile. Stool culture is pending.  End-stage renal disease on hemodialysis Patient is dialyzed on Tuesday, Thursday, Saturday. Nephrology was consulted. She was dialyzed on 9/8. She may resume her outpatient dialysis schedule from tomorrow.  Anemia of chronic disease Hemoglobin is stable.   History of congestive dilated cardiomyopathy EF is between 35-45% based on echocardiogram and cardiac catheterization. Fluid management with dialysis. Continue beta blockers.  History of essential hypertension Continue home medications.  Severe protein calorie Malnutrition Nutritional supplements  Patient is stable. Blood sugars have stabilized. Okay for discharge. She will follow-up with her PCP early next week.   PERTINENT LABS:  The results of significant diagnostics from this hospitalization (including imaging, microbiology, ancillary and laboratory) are listed below for reference.    Microbiology: Recent Results (from the past 240 hour(s))  C difficile quick scan w PCR reflex     Status: None   Collection Time: 04/17/15  8:46 AM  Result Value Ref Range Status   C Diff antigen NEGATIVE NEGATIVE Final   C Diff toxin NEGATIVE NEGATIVE Final   C Diff interpretation Negative for toxigenic C. difficile  Final  Stool culture     Status: None (Preliminary result)   Collection Time: 04/17/15  8:46 AM  Result Value Ref Range Status   Specimen Description STOOL  Final   Special Requests Normal  Final   Culture   Final    Culture reincubated for better growth Performed at [facility]    Report Status PENDING  Incomplete     Labs: Basic  Metabolic Panel:  Recent Labs Lab 04/16/15 1628 04/16/15 2335 04/17/15 0555 04/18/15 0728  NA 134* 133* 134* 133*  K 4.2 4.0 4.0 4.5  CL 100* 98* 95* 96*  CO2 20* GLUCOSE 84 75 184* 91  BUN 35* 38* 39* 22*  CREATININE 5.21* 5.73* 5.98* 4.54*  CALCIUM 8.4* 8.6* 8.6* 8.5*  MG  --   --  2.1  --   PHOS  --  3.1 3.1  --    Liver Function Tests:  Recent Labs Lab 04/16/15 1628 04/16/15 2335 04/17/15 0555  AST 36  --  26  ALT 20  --  19  ALKPHOS 78  --  79  BILITOT 0.3  --  0.6  PROT 6.2*  --  6.2*  ALBUMIN 2.5* 2.5* 2.4*    CBC:  Recent Labs Lab 04/16/15 1732 04/16/15 2335 04/17/15 0555  WBC 5.3 5.3 5.3  NEUTROABS 3.3  --   --   HGB 11.4* 11.0* 10.9*  HCT 37.6 34.0* 35.2*  MCV 89.5 88.5 89.3  PLT 153 140* 141*    CBG:  Recent Labs Lab 04/17/15 2159 04/18/15 0245 04/18/15 0606 04/18/15 0808 04/18/15 1006  GLUCAP 139* 93 106* 97 102*     IMAGING STUDIES Dg Chest 2 View  04/16/2015   CLINICAL DATA:  Weakness and recent fall  EXAM: CHEST - 2 VIEW  COMPARISON:  12/24/2014  FINDINGS: Cardiac shadow is stable. A dialysis catheter is again noted on the right. Mild central vascular prominence is seen likely related to the underlying renal failure. No significant pulmonary edema is noted. No focal infiltrate is noted. The bony structures are within normal limits.  IMPRESSION: Stable central vascular prominence without pulmonary edema. No acute abnormality is noted.   Electronically Signed   By: Alcide Clever M.D.   On: 04/16/2015 17:11   Dg Knee Complete 4 Views Right  04/16/2015   CLINICAL DATA:  Patient status post fall. Right knee pain and bruising. Initial encounter.  EXAM: RIGHT KNEE - COMPLETE 4+ VIEW  COMPARISON:  None.  FINDINGS: Medial compartment joint space loss and osteophytosis. Lateral view is limited secondary to patient positioning. No definite joint effusion identified. No evidence for acute fracture dislocation. Vascular calcifications.   IMPRESSION: No evidence for acute osseous abnormality.  Medial compartment degenerative changes.   Electronically Signed   By: Annia Belt M.D.   On: 04/16/2015 17:12    DISCHARGE EXAMINATION: Filed Vitals:   04/17/15 1652 04/17/15 2011 04/18/15 0457 04/18/15 0812  BP: 163/74 158/63 162/56 155/55  Pulse: 75 69 64 61  Temp: 98.2 F (36.8 C) 99.4 F (37.4 C) 98.9 F (37.2 C) 98.7 F (37.1 C)  TempSrc: Oral Oral Oral Oral  Resp: Height:      Weight:  47.9 kg (105 lb 9.6 oz)    SpO2: 98% 96% 98% 97%   General appearance: alert, cooperative, appears stated age and no distress Resp: clear to auscultation bilaterally Cardio: regular rate and rhythm, S1, S2 normal, no murmur, click, rub or gallop GI: soft, non-tender; bowel sounds normal; no masses,  no organomegaly Extremities: extremities normal, atraumatic, no cyanosis or edema   DISPOSITION: Home  Discharge Instructions  Call MD for:  difficulty breathing, headache or visual disturbances    Complete by:  As directed      Call MD for:  extreme fatigue    Complete by:  As directed      Call MD for:  persistant dizziness or light-headedness    Complete by:  As directed      Call MD for:  persistant nausea and vomiting    Complete by:  As directed      Call MD for:  severe uncontrolled pain    Complete by:  As directed      Diet general    Complete by:  As directed      Discharge instructions    Complete by:  As directed   Please go for dialysis as per your usual schedule. Please stop taking all of your diabetes medications for now. Keep checking your blood sugars at home. Please talk to your doctor about your poor appetite. You may need more testing.  You were cared for by a hospitalist during your hospital stay. If you have any questions about your discharge medications or the care you received while you were in the hospital after you are discharged, you can call the unit and asked to speak with the hospitalist on  call if the hospitalist that took care of you is not available. Once you are discharged, your primary care physician will handle any further medical issues. Please note that NO REFILLS for any discharge medications will be authorized once you are discharged, as it is imperative that you return to your primary care physician (or establish a relationship with a primary care physician if you do not have one) for your aftercare needs so that they can reassess your need for medications and monitor your lab values. If you do not have a primary care physician, you can call [phone] for a physician referral.     Increase activity slowly    Complete by:  As directed            ALLERGIES:  Allergies  Allergen Reactions  . Heparin Other (See Comments)    Positive Hep Induced Plt Ab and SRA 09/12/2014     Discharge Medication List as of 04/18/2015 10:55 AM    START taking these medications   Details  loperamide (IMODIUM A-D) 2 MG tablet Take 1 tablet (2 mg total) by mouth 3 (three) times daily as needed for diarrhea or loose stools., Starting 04/18/2015, Until Discontinued, Print    megestrol (MEGACE) 40 MG/ML suspension Take 10 mLs (400 mg total) by mouth daily., Starting 04/18/2015, Until Discontinued, Print      CONTINUE these medications which have NOT CHANGED   Details  acetaminophen (TYLENOL) 500 MG tablet Take 500 mg by mouth every 6 (six) hours as needed for mild pain. , Until Discontinued, Historical Med    amLODipine (NORVASC) 10 MG tablet Take 10 mg by mouth at bedtime., Until Discontinued, Historical Med    carvedilol (COREG) 12.5 MG tablet One twice daily to strengthen the heart and control BP, Normal    !! ENSURE (ENSURE) Take 237 mLs by mouth 3 (three) times daily between meals., Until Discontinued, Historical Med    fluocinonide cream (LIDEX) 0.05 % Apply 1 application topically 2 (two) times daily. Apply from neck down twice a day (provided by dermatology)., Until Discontinued,  Historical Med    hydrALAZINE (APRESOLINE) 25 MG tablet Take 1 tablet (25 mg total) by mouth every 8 (eight) hours., Starting  11/07/2014, Until Discontinued, Normal    lidocaine-prilocaine (EMLA) cream Apply 1 application topically daily as needed. Only use on dialysis days per patient., Until Discontinued, Historical Med    mirtazapine (REMERON) 15 MG tablet Take 15 mg by mouth at bedtime., Until Discontinued, Historical Med    multivitamin (RENA-VIT) TABS tablet Take 1 tablet by mouth at bedtime., Starting 03/15/2015, Until Discontinued, Print    !! Nutritional Supplements (FEEDING SUPPLEMENT, NEPRO CARB STEADY,) LIQD Take 237 mLs by mouth as needed (missed meal during dialysis.)., Starting 03/15/2015, Until Discontinued, Print    sertraline (ZOLOFT) 50 MG tablet Take 1 tablet (50 mg total) by mouth daily., Starting 02/12/2015, Until Discontinued, Normal    glucose blood (BAYER CONTOUR NEXT TEST) test strip Use as instructed, Print     !! - Potential duplicate medications found. Please discuss with provider.     Follow-up Information    Schedule an appointment as soon as possible for a visit with GREEN, Lenon Curt, MD.   Specialty:  Internal Medicine   Why:  Appointment day 04/22/2015 @1 :30pm for post hospitalization follow up. Please arrive 15 minutes early.    Contact information:   [address] [place] [address] [phone]       TOTAL DISCHARGE TIME: 35 minutes  [facility]  [facility] Pager [phone]  04/18/2015, 1:55 PM

## 2015-04-18 NOTE — Care Management Note (Signed)
Case Management Note  Patient Details  Name: Joyel Chenette MRN: [id number] Date of Birth: 09/23/1929  Subjective/Objective:            CM following for progression and d/c planning.        Action/Plan: No d/c needs identified.  Expected Discharge Date:       04/18/15           Expected Discharge Plan:  Home/Self Care  In-House Referral:  NA  Discharge planning Services  NA  Post Acute Care Choice:  NA Choice offered to:  NA  DME Arranged:    DME Agency:     HH Arranged:    HH Agency:     Status of Service:  Completed, signed off  Medicare Important Message Given:    Date Medicare IM Given:    Medicare IM give by:    Date Additional Medicare IM Given:    Additional Medicare Important Message give by:     If discussed at Long Length of Stay Meetings, dates discussed:    Additional Comments:  Starlyn Skeans, RN 04/18/2015, 2:59 PM

## 2015-04-21 LAB — STOOL CULTURE: SPECIAL REQUESTS: NORMAL

## 2015-04-22 ENCOUNTER — Encounter: Payer: Self-pay | Admitting: Internal Medicine

## 2015-04-22 ENCOUNTER — Ambulatory Visit: Payer: Medicare Other | Admitting: Internal Medicine

## 2015-04-22 ENCOUNTER — Encounter: Payer: Self-pay | Admitting: Family

## 2015-04-23 ENCOUNTER — Ambulatory Visit: Payer: Medicare Other | Admitting: Family

## 2015-05-14 ENCOUNTER — Telehealth: Payer: Self-pay | Admitting: Internal Medicine

## 2015-05-14 NOTE — Telephone Encounter (Signed)
Called and spoke with Mrs. Natalie Wolfe, says she is not interested in receiving any equilpment from [facility].  Says she has a Heating Pad at home says she does not need a Wrist orthosis Brace,Back Brace, Knee Brace.   Patient request that we do not approve any of these items.  Sent form back to the company " Patient is not interested'.. [name]

## 2015-06-04 ENCOUNTER — Encounter: Payer: Self-pay | Admitting: Gastroenterology

## 2015-06-24 ENCOUNTER — Ambulatory Visit: Payer: Medicare Other | Admitting: Gastroenterology

## 2015-06-26 ENCOUNTER — Other Ambulatory Visit: Payer: Self-pay | Admitting: Internal Medicine

## 2015-07-29 ENCOUNTER — Other Ambulatory Visit: Payer: Self-pay | Admitting: Internal Medicine

## 2015-09-05 ENCOUNTER — Other Ambulatory Visit: Payer: Self-pay | Admitting: Internal Medicine

## 2015-09-06 ENCOUNTER — Encounter (HOSPITAL_COMMUNITY): Payer: Self-pay | Admitting: *Deleted

## 2015-09-06 ENCOUNTER — Emergency Department (HOSPITAL_COMMUNITY)
Admission: EM | Admit: 2015-09-06 | Discharge: 2015-09-06 | Disposition: A | Discharge status: Home / Self Care | Payer: Medicare Other | Attending: Emergency Medicine | Admitting: Emergency Medicine

## 2015-09-06 DIAGNOSIS — F411 Generalized anxiety disorder: Secondary | ICD-10-CM | POA: Insufficient documentation

## 2015-09-06 DIAGNOSIS — Z8669 Personal history of other diseases of the nervous system and sense organs: Secondary | ICD-10-CM | POA: Insufficient documentation

## 2015-09-06 DIAGNOSIS — Z9889 Other specified postprocedural states: Secondary | ICD-10-CM | POA: Diagnosis not present

## 2015-09-06 DIAGNOSIS — D649 Anemia, unspecified: Secondary | ICD-10-CM | POA: Diagnosis not present

## 2015-09-06 DIAGNOSIS — R627 Adult failure to thrive: Secondary | ICD-10-CM | POA: Insufficient documentation

## 2015-09-06 DIAGNOSIS — F0390 Unspecified dementia without behavioral disturbance: Secondary | ICD-10-CM | POA: Diagnosis not present

## 2015-09-06 DIAGNOSIS — Z79899 Other long term (current) drug therapy: Secondary | ICD-10-CM | POA: Diagnosis not present

## 2015-09-06 DIAGNOSIS — N182 Chronic kidney disease, stage 2 (mild): Secondary | ICD-10-CM | POA: Diagnosis not present

## 2015-09-06 DIAGNOSIS — I509 Heart failure, unspecified: Secondary | ICD-10-CM | POA: Diagnosis not present

## 2015-09-06 DIAGNOSIS — Z7952 Long term (current) use of systemic steroids: Secondary | ICD-10-CM | POA: Diagnosis not present

## 2015-09-06 DIAGNOSIS — E669 Obesity, unspecified: Secondary | ICD-10-CM | POA: Insufficient documentation

## 2015-09-06 DIAGNOSIS — I129 Hypertensive chronic kidney disease with stage 1 through stage 4 chronic kidney disease, or unspecified chronic kidney disease: Secondary | ICD-10-CM | POA: Insufficient documentation

## 2015-09-06 DIAGNOSIS — E559 Vitamin D deficiency, unspecified: Secondary | ICD-10-CM | POA: Diagnosis not present

## 2015-09-06 DIAGNOSIS — E1129 Type 2 diabetes mellitus with other diabetic kidney complication: Secondary | ICD-10-CM | POA: Insufficient documentation

## 2015-09-06 DIAGNOSIS — Z872 Personal history of diseases of the skin and subcutaneous tissue: Secondary | ICD-10-CM | POA: Insufficient documentation

## 2015-09-06 DIAGNOSIS — R7989 Other specified abnormal findings of blood chemistry: Secondary | ICD-10-CM | POA: Diagnosis present

## 2015-09-06 DIAGNOSIS — Z87891 Personal history of nicotine dependence: Secondary | ICD-10-CM | POA: Insufficient documentation

## 2015-09-06 LAB — BASIC METABOLIC PANEL
ANION GAP: 12 (ref 5–15)
CHLORIDE: 98 mmol/L — AB (ref 101–111)
CO2: 28 mmol/L (ref 22–32)
Calcium: 9 mg/dL (ref 8.9–10.3)
Creatinine, Ser: 1.68 mg/dL — ABNORMAL HIGH (ref 0.44–1.00)
GFR calc Af Amer: 31 mL/min — ABNORMAL LOW (ref >60)
GFR, EST NON AFRICAN AMERICAN: 27 mL/min — AB (ref >60)
GLUCOSE: 107 mg/dL — AB (ref 65–99)
POTASSIUM: 3.5 mmol/L (ref 3.5–5.1)
Sodium: 138 mmol/L (ref 135–145)

## 2015-09-06 LAB — CBC
HEMATOCRIT: 23.5 % — AB (ref 36.0–46.0)
Hemoglobin: 7.3 g/dL — ABNORMAL LOW (ref 12.0–15.0)
MCH: 29.2 pg (ref 26.0–34.0)
MCHC: 31.1 g/dL (ref 30.0–36.0)
MCV: 94 fL (ref 78.0–100.0)
PLATELETS: 223 10*3/uL (ref 150–400)
RBC: 2.5 MIL/uL — AB (ref 3.87–5.11)
RDW: 16.3 % — ABNORMAL HIGH (ref 11.5–15.5)
WBC: 8.8 10*3/uL (ref 4.0–10.5)

## 2015-09-06 LAB — TYPE AND SCREEN
ABO/RH(D): B POS
Antibody Screen: NEGATIVE

## 2015-09-06 LAB — POC OCCULT BLOOD, ED: FECAL OCCULT BLD: NEGATIVE

## 2015-09-06 MED ORDER — DOCUSATE SODIUM 100 MG PO CAPS
100.0000 mg | ORAL_CAPSULE | Freq: Every day | ORAL | Status: AC
Start: 2015-09-06 — End: ?

## 2015-09-06 MED ORDER — FERROUS SULFATE 325 (65 FE) MG PO TABS: 325.0000 mg | ORAL_TABLET | Freq: Every day | ORAL | Status: AC

## 2015-09-06 NOTE — ED Notes (Signed)
Pt reports going to dialysis today, had full treatment but sent here due to abnormal lab, Hgb 6.8.

## 2015-09-06 NOTE — ED Provider Notes (Signed)
CSN: [id number]     Arrival date & time 09/06/15  1537 History   First MD Initiated Contact with Patient 09/06/15 1616     Chief Complaint  Patient presents with  . Vascular Access Problem  . Abnormal Lab     (Consider location/radiation/quality/duration/timing/severity/associated sxs/prior Treatment) HPI Comments: 82yo F w/ extensive PMH including ESRD on HD, T2DM, HTN, HLD, PVD, dementia who p/w anemia. Patient had a routine dialysis appointment today and was sent here for further evaluation after she was found to have a hemoglobin of 6.8 which is lower than her baseline around 9. The patient denies any recent bleeding, bloody stools, black stools, or lightheadedness. She endorses decreased appetite but denies any fevers, vomiting, diarrhea, abdominal pain, or recent illness. She has never required a blood transfusion before. She denies any significant NSAID use. She has chronic intermittent shortness of breath which she has had since starting dialysis one year ago.  The history is provided by the patient.    Past Medical History  Diagnosis Date  . Urinary frequency   . Routine general medical examination at a health care facility   . Other atopic dermatitis and related conditions   . Rash and other nonspecific skin eruption   . Other anxiety states   . Insomnia, unspecified   . PVD (peripheral vascular disease) (HCC)   . Unspecified vitamin D deficiency   . Anemia, unspecified   . Obesity, unspecified   . Nocturia   . Pain in joint, lower leg   . Other and unspecified hyperlipidemia   . Unspecified essential hypertension   . Cataract   . CHF (congestive heart failure) (HCC)   . Thrombocytopenia (HCC)   . Chronic kidney disease, stage II (mild)     [facility]  . Type II or unspecified type diabetes mellitus with renal manifestations, not stated as uncontrolled   . Memory loss 02/10/2015  . Dementia     forgetful, .  . Failure to thrive in adult  03/09/2015   Past Surgical History  Procedure Laterality Date  . Abdominal hysterectomy  1970  . Breast surgery  1979    nodule removed  . Eye surgery  2004    catarcts removed from right eye.  . Multiple tooth extractions  09/2013    Remonve remaining 3 teeth, no with dentures   . Av fistula placement Left 09/06/2014    Procedure: CREATION OF LEFT UPPER ARM ARTERIOVENOUS (AV) FISTULA ;  Surgeon: Pryor Ochoa, MD;  Location: [facility] OR;  Service: Vascular;  Laterality: Left;  . Insertion of dialysis catheter N/A 09/06/2014    Procedure: INSERTION OF DIALYSIS CATHETER RIGHT INTERNAL JUGULAR VEIN;  Surgeon: Pryor Ochoa, MD;  Location[facility] OR;  Service: Vascular;  Laterality: N/A;  . Ligation of competing branches of arteriovenous fistula Left 09/06/2014    Procedure: LIGATION OF COMPETING BRANCHES OF ARTERIOVENOUS FISTULA;  Surgeon: Pryor Ochoa, MD;  Location: [facility] OR;  Service: Vascular;  Laterality: Left;  . Left heart catheterization with coronary angiogram N/A 09/17/2014    Procedure: LEFT HEART CATHETERIZATION WITH CORONARY ANGIOGRAM;  Surgeon: Lennette Bihari, MD;  Location: [facility] CATH LAB;  Service: Cardiovascular;  Laterality: N/A;  . False aneurysm repair Left 03/07/2015    Procedure: REPAIR PSEUDOANEURYSMS OF LEFT ARM ARTERIOVENOUS FISTULA;  Surgeon: Chuck Hint, MD;  Location: [facility] OR;  Service: Vascular;  Laterality: Left;   Family History  Problem Relation Age of Onset  . Diabetes Mother  Social History  Substance Use Topics  . Smoking status: Former Smoker    Types: Cigarettes    Quit date: 08/10/2000  . Smokeless tobacco: Never Used  . Alcohol Use: No   OB History    No data available     Review of Systems 10 Systems reviewed and are negative for acute change except as noted in the HPI.    Allergies  Heparin  Home Medications   Prior to Admission medications   Medication Sig Start Date End Date Taking? Authorizing Provider  acetaminophen (TYLENOL) 500 MG  tablet Take 1,000 mg by mouth every 6 (six) hours as needed for mild pain.    Yes Historical Provider, MD  amLODipine (NORVASC) 10 MG tablet Take 10 mg by mouth at bedtime.   Yes Historical Provider, MD  carvedilol (COREG) 12.5 MG tablet One twice daily to strengthen the heart and control BP Patient taking differently: Take 12.5 mg by mouth 2 (two) times daily with a meal. One twice daily to strengthen the heart and control BP 02/12/15  Yes [name], MD  ENSURE (ENSURE) Take 237 mLs by mouth 3 (three) times daily between meals.   Yes Historical Provider, MD  fluocinonide cream (LIDEX) 0.05 % Apply 1 application topically 2 (two) times daily. Apply from neck down twice a day (provided by dermatology).   Yes Historical Provider, MD  hydrALAZINE (APRESOLINE) 25 MG tablet Take 1 tablet (25 mg total) by mouth every 8 (eight) hours. 11/07/14  Yes [name], MD  hydrocortisone 2.5 % lotion Apply 1 application topically daily. Apply to face 09/05/15  Yes Historical Provider, MD  lidocaine-prilocaine (EMLA) cream Apply 1 application topically daily as needed (prior to dialysis).    Yes Historical Provider, MD  multivitamin (RENA-VIT) TABS tablet Take 1 tablet by mouth at bedtime. 03/15/15  Yes Penny Pia, MD  Nutritional Supplements (FEEDING SUPPLEMENT, NEPRO CARB STEADY,) LIQD Take 237 mLs by mouth as needed (missed meal during dialysis.). 03/15/15  Yes Penny Pia, MD  sertraline (ZOLOFT) 50 MG tablet TAKE ONE TABLET BY MOUTH ONCE DAILY 09/05/15  Yes [name], MD  docusate sodium (COLACE) 100 MG capsule Take 1 capsule (100 mg total) by mouth daily. To prevent constipation 09/06/15   Laurence Spates, MD  ferrous sulfate 325 (65 FE) MG tablet Take 1 tablet (325 mg total) by mouth daily. 09/06/15   Ambrose Finland Britny Riel, MD  glucose blood (BAYER CONTOUR NEXT TEST) test strip Use as instructed 11/27/12   Edison Pace, RPH-CPP   BP 173/58 mmHg  Pulse 79  Temp(Src) 98 F (36.7 C) (Oral)  Resp 18   SpO2 98% Physical Exam  Constitutional: She is oriented to person, place, and time. No distress.  Thin, frail elderly woman  HENT:  Head: Normocephalic and atraumatic.  Moist mucous membranes  Eyes: Pupils are equal, round, and reactive to light.  Pale conjunctivae  Neck: Neck supple.  Cardiovascular: Normal rate, regular rhythm and normal heart sounds.   No murmur heard. Pulmonary/Chest: Effort normal and breath sounds normal.  Abdominal: Soft. Bowel sounds are normal. She exhibits no distension. There is no tenderness.  Genitourinary:  No blood in rectal vault  Musculoskeletal: She exhibits no edema.  Fistula LUE w/ no active bleeding  Neurological: She is alert and oriented to person, place, and time.  Fluent speech  Skin: Skin is warm and dry.  Psychiatric: She has a normal mood and affect. Judgment normal.  Nursing note and vitals reviewed.  Chaperone was present during exam.   ED Course  Procedures (including critical care time) Labs Review Labs Reviewed  CBC - Abnormal; Notable for the following:    RBC 2.50 (*)    Hemoglobin 7.3 (*)    HCT 23.5 (*)    RDW 16.3 (*)    All other components within normal limits  BASIC METABOLIC PANEL - Abnormal; Notable for the following:    Chloride 98 (*)    Glucose, Bld 107 (*)    BUN <5 (*)    Creatinine, Ser 1.68 (*)    GFR calc non Af Amer 27 (*)    GFR calc Af Amer 31 (*)    All other components within normal limits  OCCULT BLOOD X 1 CARD TO LAB, STOOL  POC OCCULT BLOOD, ED  TYPE AND SCREEN    Imaging Review No results found. I have personally reviewed and evaluated these lab results as part of my medical decision-making.   EKG Interpretation None      MDM   Final diagnoses:  Anemia, unspecified anemia type   patient sent here from dialysis today after she was found to have worsening anemia at hemoglobin 6.8 which is lower than her baseline. She was thin and frail appearing but comfortable at  presentation. Vital signs notable for hypertension at 181/60, heart rate 74. No blood on rectal exam. Obtained above lab work which shows hemoglobin 7.3. K 3.5.  She does have a mildly worsening anemia compared to recent lab work, however she has no signs of bleeding currently and I suspect anemia of chronic disease given her kidney failure. Her vital signs are normal and she has no complaints currently to suggest acute blood loss. I have started the patient on iron and have instructed to follow-up with nephrologist this week for further treatment of her anemia. Return precautions reviewed and patient voiced understanding. He should discharged in satisfactory condition.    Laurence Spates, MD 09/06/15 [phone]

## 2015-09-06 NOTE — Discharge Instructions (Signed)
Anemia, Nonspecific Anemia is a condition in which the concentration of red blood cells or hemoglobin in the blood is below normal. Hemoglobin is a substance in red blood cells that carries oxygen to the tissues of the body. Anemia results in not enough oxygen reaching these tissues.  CAUSES  Common causes of anemia include:   Excessive bleeding. Bleeding may be internal or external. This includes excessive bleeding from periods (in women) or from the intestine.   Poor nutrition.   Chronic kidney, thyroid, and liver disease.  Bone marrow disorders that decrease red blood cell production.  Cancer and treatments for cancer.  HIV, AIDS, and their treatments.  Spleen problems that increase red blood cell destruction.  Blood disorders.  Excess destruction of red blood cells due to infection, medicines, and autoimmune disorders. SIGNS AND SYMPTOMS   Minor weakness.   Dizziness.   Headache.  Palpitations.   Shortness of breath, especially with exercise.   Paleness.  Cold sensitivity.  Indigestion.  Nausea.  Difficulty sleeping.  Difficulty concentrating. Symptoms may occur suddenly or they may develop slowly.  DIAGNOSIS  Additional blood tests are often needed. These help your health care provider determine the best treatment. Your health care provider will check your stool for blood and look for other causes of blood loss.  TREATMENT  Treatment varies depending on the cause of the anemia. Treatment can include:   Supplements of iron, vitamin B12, or folic acid.   Hormone medicines.   A blood transfusion. This may be needed if blood loss is severe.   Hospitalization. This may be needed if there is significant continual blood loss.   Dietary changes.  Spleen removal. HOME CARE INSTRUCTIONS Keep all follow-up appointments. It often takes many weeks to correct anemia, and having your health care provider check on your condition and your response to  treatment is very important. SEEK IMMEDIATE MEDICAL CARE IF:   You develop extreme weakness, shortness of breath, or chest pain.   You become dizzy or have trouble concentrating.  You develop heavy vaginal bleeding.   You develop a rash.   You have bloody or black, tarry stools.   You faint.   You vomit up blood.   You vomit repeatedly.   You have abdominal pain.  You have a fever or persistent symptoms for more than 2-3 days.   You have a fever and your symptoms suddenly get worse.   You are dehydrated.  MAKE SURE YOU:  Understand these instructions.  Will watch your condition.  Will get help right away if you are not doing well or get worse.   This information is not intended to replace advice given to you by your health care provider. Make sure you discuss any questions you have with your health care provider.   Document Released: 09/02/2004 Document Revised: 03/28/2013 Document Reviewed: 01/19/2013 Elsevier Interactive Patient Education 2016 [employer]  

## 2015-09-16 ENCOUNTER — Encounter (HOSPITAL_COMMUNITY): Payer: Self-pay

## 2015-09-16 ENCOUNTER — Emergency Department (HOSPITAL_COMMUNITY)
Admission: EM | Admit: 2015-09-16 | Discharge: 2015-09-16 | Disposition: A | Discharge status: Home / Self Care | Payer: Medicare Other | Attending: Emergency Medicine | Admitting: Emergency Medicine

## 2015-09-16 DIAGNOSIS — Z872 Personal history of diseases of the skin and subcutaneous tissue: Secondary | ICD-10-CM | POA: Insufficient documentation

## 2015-09-16 DIAGNOSIS — Z87891 Personal history of nicotine dependence: Secondary | ICD-10-CM | POA: Insufficient documentation

## 2015-09-16 DIAGNOSIS — T82868A Thrombosis of vascular prosthetic devices, implants and grafts, initial encounter: Secondary | ICD-10-CM | POA: Diagnosis not present

## 2015-09-16 DIAGNOSIS — G4700 Insomnia, unspecified: Secondary | ICD-10-CM | POA: Diagnosis not present

## 2015-09-16 DIAGNOSIS — I509 Heart failure, unspecified: Secondary | ICD-10-CM | POA: Diagnosis not present

## 2015-09-16 DIAGNOSIS — E669 Obesity, unspecified: Secondary | ICD-10-CM | POA: Diagnosis not present

## 2015-09-16 DIAGNOSIS — Z7952 Long term (current) use of systemic steroids: Secondary | ICD-10-CM | POA: Insufficient documentation

## 2015-09-16 DIAGNOSIS — F419 Anxiety disorder, unspecified: Secondary | ICD-10-CM | POA: Diagnosis not present

## 2015-09-16 DIAGNOSIS — F0390 Unspecified dementia without behavioral disturbance: Secondary | ICD-10-CM | POA: Insufficient documentation

## 2015-09-16 DIAGNOSIS — I120 Hypertensive chronic kidney disease with stage 5 chronic kidney disease or end stage renal disease: Secondary | ICD-10-CM | POA: Insufficient documentation

## 2015-09-16 DIAGNOSIS — Z8669 Personal history of other diseases of the nervous system and sense organs: Secondary | ICD-10-CM | POA: Diagnosis not present

## 2015-09-16 DIAGNOSIS — Z992 Dependence on renal dialysis: Secondary | ICD-10-CM | POA: Diagnosis not present

## 2015-09-16 DIAGNOSIS — Y658 Other specified misadventures during surgical and medical care: Secondary | ICD-10-CM | POA: Diagnosis not present

## 2015-09-16 DIAGNOSIS — D649 Anemia, unspecified: Secondary | ICD-10-CM | POA: Diagnosis not present

## 2015-09-16 DIAGNOSIS — Z9889 Other specified postprocedural states: Secondary | ICD-10-CM | POA: Diagnosis not present

## 2015-09-16 DIAGNOSIS — E1129 Type 2 diabetes mellitus with other diabetic kidney complication: Secondary | ICD-10-CM | POA: Insufficient documentation

## 2015-09-16 DIAGNOSIS — N186 End stage renal disease: Secondary | ICD-10-CM | POA: Insufficient documentation

## 2015-09-16 DIAGNOSIS — Z79899 Other long term (current) drug therapy: Secondary | ICD-10-CM | POA: Diagnosis not present

## 2015-09-16 DIAGNOSIS — R7989 Other specified abnormal findings of blood chemistry: Secondary | ICD-10-CM | POA: Diagnosis present

## 2015-09-16 DIAGNOSIS — T8249XA Other complication of vascular dialysis catheter, initial encounter: Secondary | ICD-10-CM

## 2015-09-16 DIAGNOSIS — Z9841 Cataract extraction status, right eye: Secondary | ICD-10-CM | POA: Insufficient documentation

## 2015-09-16 LAB — COMPREHENSIVE METABOLIC PANEL
ALBUMIN: 2.7 g/dL — AB (ref 3.5–5.0)
ALT: 9 U/L — ABNORMAL LOW (ref 14–54)
ANION GAP: 12 (ref 5–15)
AST: 20 U/L (ref 15–41)
Alkaline Phosphatase: 91 U/L (ref 38–126)
BILIRUBIN TOTAL: 0.5 mg/dL (ref 0.3–1.2)
BUN: 59 mg/dL — ABNORMAL HIGH (ref 6–20)
CO2: 32 mmol/L (ref 22–32)
Calcium: 9.4 mg/dL (ref 8.9–10.3)
Chloride: 92 mmol/L — ABNORMAL LOW (ref 101–111)
Creatinine, Ser: 9.08 mg/dL — ABNORMAL HIGH (ref 0.44–1.00)
GFR calc non Af Amer: 4 mL/min — ABNORMAL LOW (ref >60)
GFR, EST AFRICAN AMERICAN: 4 mL/min — AB (ref >60)
GLUCOSE: 151 mg/dL — AB (ref 65–99)
POTASSIUM: 5.1 mmol/L (ref 3.5–5.1)
SODIUM: 136 mmol/L (ref 135–145)
TOTAL PROTEIN: 7.5 g/dL (ref 6.5–8.1)

## 2015-09-16 LAB — IRON AND TIBC
Iron: 22 ug/dL — ABNORMAL LOW (ref 28–170)
SATURATION RATIOS: 16 % (ref 10.4–31.8)
TIBC: 141 ug/dL — ABNORMAL LOW (ref 250–450)
UIBC: 119 ug/dL

## 2015-09-16 LAB — RETICULOCYTES
RBC.: 2.37 MIL/uL — AB (ref 3.87–5.11)
RETIC COUNT ABSOLUTE: 99.5 10*3/uL (ref 19.0–186.0)
RETIC CT PCT: 4.2 % — AB (ref 0.4–3.1)

## 2015-09-16 LAB — CBC
HEMATOCRIT: 25.2 % — AB (ref 36.0–46.0)
HEMOGLOBIN: 7.4 g/dL — AB (ref 12.0–15.0)
MCH: 27.3 pg (ref 26.0–34.0)
MCHC: 29.4 g/dL — AB (ref 30.0–36.0)
MCV: 93 fL (ref 78.0–100.0)
Platelets: 247 10*3/uL (ref 150–400)
RBC: 2.71 MIL/uL — AB (ref 3.87–5.11)
RDW: 17.1 % — ABNORMAL HIGH (ref 11.5–15.5)
WBC: 7.3 10*3/uL (ref 4.0–10.5)

## 2015-09-16 LAB — TYPE AND SCREEN
ABO/RH(D): B POS
ANTIBODY SCREEN: NEGATIVE

## 2015-09-16 LAB — FERRITIN: Ferritin: 1283 ng/mL — ABNORMAL HIGH (ref 11–307)

## 2015-09-16 LAB — POC OCCULT BLOOD, ED: FECAL OCCULT BLD: NEGATIVE

## 2015-09-16 LAB — FOLATE: FOLATE: 17 ng/mL (ref >5.9)

## 2015-09-16 LAB — VITAMIN B12: VITAMIN B 12: 646 pg/mL (ref 180–914)

## 2015-09-16 NOTE — ED Provider Notes (Signed)
CSN: [id number]     Arrival date & time 09/16/15  1646 History   First MD Initiated Contact with Patient 09/16/15 1846     Chief Complaint  Patient presents with  . Abnormal Lab     (Consider location/radiation/quality/duration/timing/severity/associated sxs/prior Treatment) HPI Natalie Wolfe is a 82 y.o. female with CKD on HD-schedule T/Th/Sat, hypertension, diabetes, PVD, comes in for evaluation of abnormal lab. Patient reports she was sent by her doctor at dialysis today for a low hemoglobin. She denies any headaches, vision changes, dizziness, chest pain, shortness of breath, abdominal pain, nausea or vomiting, numbness or weakness, dark or bloody stool. She reports being seen for this same problem approximately 2 weeks ago. She has not followed up with nephrology for further evaluation of the anemia. She reports taking her previously prescribed iron supplements and "living on ensure", she has decreased appetite. No anticoagulation.  Past Medical History  Diagnosis Date  . Urinary frequency   . Routine general medical examination at a health care facility   . Other atopic dermatitis and related conditions   . Rash and other nonspecific skin eruption   . Other anxiety states   . Insomnia, unspecified   . PVD (peripheral vascular disease) (HCC)   . Unspecified vitamin D deficiency   . Anemia, unspecified   . Obesity, unspecified   . Nocturia   . Pain in joint, lower leg   . Other and unspecified hyperlipidemia   . Unspecified essential hypertension   . Cataract   . CHF (congestive heart failure) (HCC)   . Thrombocytopenia (HCC)   . Chronic kidney disease, stage II (mild)     [facility]  . Type II or unspecified type diabetes mellitus with renal manifestations, not stated as uncontrolled   . Memory loss 02/10/2015  . Dementia     forgetful, .  . Failure to thrive in adult 03/09/2015   Past Surgical History  Procedure Laterality Date  . Abdominal  hysterectomy  1970  . Breast surgery  1979    nodule removed  . Eye surgery  2004    catarcts removed from right eye.  . Multiple tooth extractions  09/2013    Remonve remaining 3 teeth, no with dentures   . Av fistula placement Left 09/06/2014    Procedure: CREATION OF LEFT UPPER ARM ARTERIOVENOUS (AV) FISTULA ;  Surgeon: Pryor Ochoa, MD;  Location: [facility] OR;  Service: Vascular;  Laterality: Left;  . Insertion of dialysis catheter N/A 09/06/2014    Procedure: INSERTION OF DIALYSIS CATHETER RIGHT INTERNAL JUGULAR VEIN;  Surgeon: Pryor Ochoa, MD;  Location: [facility] OR;  Service: Vascular;  Laterality: N/A;  . Ligation of competing branches of arteriovenous fistula Left 09/06/2014    Procedure: LIGATION OF COMPETING BRANCHES OF ARTERIOVENOUS FISTULA;  Surgeon: Pryor Ochoa, MD;  Location: [facility] OR;  Service: Vascular;  Laterality: Left;  . Left heart catheterization with coronary angiogram N/A 09/17/2014    Procedure: LEFT HEART CATHETERIZATION WITH CORONARY ANGIOGRAM;  Surgeon: Lennette Bihari, MD;  Location: [facility] CATH LAB;  Service: Cardiovascular;  Laterality: N/A;  . False aneurysm repair Left 03/07/2015    Procedure: REPAIR PSEUDOANEURYSMS OF LEFT ARM ARTERIOVENOUS FISTULA;  Surgeon: Chuck Hint, MD;  Location: [facility] OR;  Service: Vascular;  Laterality: Left;   Family History  Problem Relation Age of Onset  . Diabetes Mother    Social History  Substance Use Topics  . Smoking status: Former Smoker    Types: Cigarettes  Quit date: 08/10/2000  . Smokeless tobacco: Never Used  . Alcohol Use: No   OB History    No data available     Review of Systems A 10 point review of systems was completed and was negative except for pertinent positives and negatives as mentioned in the history of present illness     Allergies  Heparin  Home Medications   Prior to Admission medications   Medication Sig Start Date End Date Taking? Authorizing Provider  acetaminophen (TYLENOL) 500 MG tablet  Take 1,000 mg by mouth every 6 (six) hours as needed for mild pain.     Historical Provider, MD  amLODipine (NORVASC) 10 MG tablet Take 10 mg by mouth at bedtime.    Historical Provider, MD  carvedilol (COREG) 12.5 MG tablet One twice daily to strengthen the heart and control BP Patient taking differently: Take 12.5 mg by mouth 2 (two) times daily with a meal. One twice daily to strengthen the heart and control BP 02/12/15   [name], MD  docusate sodium (COLACE) 100 MG capsule Take 1 capsule (100 mg total) by mouth daily. To prevent constipation 09/06/15   Laurence Spates, MD  ENSURE (ENSURE) Take 237 mLs by mouth 3 (three) times daily between meals.    Historical Provider, MD  ferrous sulfate 325 (65 FE) MG tablet Take 1 tablet (325 mg total) by mouth daily. 09/06/15   Laurence Spates, MD  fluocinonide cream (LIDEX) 0.05 % Apply 1 application topically 2 (two) times daily. Apply from neck down twice a day (provided by dermatology).    Historical Provider, MD  glucose blood (BAYER CONTOUR NEXT TEST) test strip Use as instructed 11/27/12   Edison Pace, RPH-CPP  hydrALAZINE (APRESOLINE) 25 MG tablet Take 1 tablet (25 mg total) by mouth every 8 (eight) hours. 11/07/14   [name], MD  hydrocortisone 2.5 % lotion Apply 1 application topically daily. Apply to face 09/05/15   Historical Provider, MD  lidocaine-prilocaine (EMLA) cream Apply 1 application topically daily as needed (prior to dialysis).     Historical Provider, MD  multivitamin (RENA-VIT) TABS tablet Take 1 tablet by mouth at bedtime. 03/15/15   Penny Pia, MD  Nutritional Supplements (FEEDING SUPPLEMENT, NEPRO CARB STEADY,) LIQD Take 237 mLs by mouth as needed (missed meal during dialysis.). 03/15/15   Penny Pia, MD  sertraline (ZOLOFT) 50 MG tablet TAKE ONE TABLET BY MOUTH ONCE DAILY 09/05/15   [name], MD   BP 165/55 mmHg  Pulse 59  Temp(Src) 99.3 F (37.4 C) (Oral)  Resp 18  SpO2 100% Physical Exam   Constitutional: She is oriented to person, place, and time. She appears well-developed and well-nourished.  Small, frail, but overall well-appearing African-American female  HENT:  Head: Normocephalic and atraumatic.  Mouth/Throat: Oropharynx is clear and moist.  Eyes: Pupils are equal, round, and reactive to light. Right eye exhibits no discharge. Left eye exhibits no discharge. No scleral icterus.  Pale conjunctiva  Neck: Neck supple.  Cardiovascular: Normal rate, regular rhythm and normal heart sounds.   Pulmonary/Chest: Effort normal and breath sounds normal. No respiratory distress. She has no wheezes. She has no rales.  Abdominal: Soft. There is no tenderness.  Genitourinary:  No blood in rectal vault. No frank blood.  Musculoskeletal: She exhibits no tenderness.  Neurological: She is alert and oriented to person, place, and time.  Cranial Nerves II-XII grossly intact  Skin: Skin is warm and dry. No rash noted.  Psychiatric:  She has a normal mood and affect.  Nursing note and vitals reviewed.   ED Course  Procedures (including critical care time) Labs Review Labs Reviewed  COMPREHENSIVE METABOLIC PANEL - Abnormal; Notable for the following:    Chloride 92 (*)    Glucose, Bld 151 (*)    BUN 59 (*)    Creatinine, Ser 9.08 (*)    Albumin 2.7 (*)    ALT 9 (*)    GFR calc non Af Amer 4 (*)    GFR calc Af Amer 4 (*)    All other components within normal limits  CBC - Abnormal; Notable for the following:    RBC 2.71 (*)    Hemoglobin 7.4 (*)    HCT 25.2 (*)    MCHC 29.4 (*)    RDW 17.1 (*)    All other components within normal limits  VITAMIN B12  FOLATE  IRON AND TIBC  FERRITIN  RETICULOCYTES  POC OCCULT BLOOD, ED  TYPE AND SCREEN    Imaging Review No results found. I have personally reviewed and evaluated these images and lab results as part of my medical decision-making.   EKG Interpretation None     Filed Vitals:   09/16/15 1713 09/16/15 1858 09/16/15  1901 09/16/15 1925  BP: 145/54 181/75 178/62 165/55  Pulse: 60 60 61 59  Temp: 99.3 F (37.4 C)     TempSrc: Oral     Resp: SpO2: 99% 99% 99% 100%    MDM  Brigett Estell is a 82 y.o. female history of chronic kidney disease on hemodialysis comes in for evaluation of anemia. She reports being sent by her dialysis center to the ER for further evaluation of her anemia. Was not up to dialysis and did not complete visit. Denies dizziness, chest pain, shortness of breath, abdominal pain, numbness or weakness, dark or bloody stools. Her hemoglobin today is 7.4, K 5.1, creatinine 9.08. Most recent blood pressure 160s over 50s, heart rate 60s. Hemoccult negative. No evidence of acute or emergent bleed. Anemia panel ordered for PCP follow-up. Hemoglobin is unchanged from previous hemoglobin of 7.3 that was taken on 1/28. She was evaluated at that time and also found to have an asymptomatic anemia, suspected to be due to anemia of chronic disease. She was given information to follow up with a nephrologist for further evaluation of her anemia, but reports she did not go. She did not finish her dialysis session today.  Discussed the need to follow-up with her PCP in the next 2-3 days for reevaluation of her anemia. Discussed strict return precautions to the ED including headache, vision changes, dizziness, chest pain or shortness of breath, numbness or weakness, dark or bloody stools. She verbalizes understanding and is agreeable to this plan. Prior to patient discharge, I discussed and reviewed this case with Dr.Steinl, who also saw and evaluated the patient    Final diagnoses:  Anemia, unspecified anemia type        Joycie Peek, PA-C 09/17/15 0009  Cathren Laine, MD 09/17/15 [phone]

## 2015-09-16 NOTE — Discharge Instructions (Signed)
It was our pleasure to provide your ER care today - we hope that you feel better.  We discussed your case with your kidney doctors -  they indicate you must go to the "[facility]' located on [address] tomorrow morning (Wednesday 2/8), and to be there at 7:30 AM.  Do not eat or drink anything after midnight tonight.  You will need someone to drive you there, and wait for you during your procedure, to improve the flow of blood through your dialysis access in your left upper arm.    For your anemia (low blood count, hemoglobin 7.4) - follow up with your primary care doctor, and kidney doctors in the next couple days.  Return to ER if worse, faint, dizzy, trouble breathing, fevers, other concern.    Anemia, Nonspecific Anemia is a condition in which the concentration of red blood cells or hemoglobin in the blood is below normal. Hemoglobin is a substance in red blood cells that carries oxygen to the tissues of the body. Anemia results in not enough oxygen reaching these tissues.  CAUSES  Common causes of anemia include:   Excessive bleeding. Bleeding may be internal or external. This includes excessive bleeding from periods (in women) or from the intestine.   Poor nutrition.   Chronic kidney, thyroid, and liver disease.  Bone marrow disorders that decrease red blood cell production.  Cancer and treatments for cancer.  HIV, AIDS, and their treatments.  Spleen problems that increase red blood cell destruction.  Blood disorders.  Excess destruction of red blood cells due to infection, medicines, and autoimmune disorders. SIGNS AND SYMPTOMS   Minor weakness.   Dizziness.   Headache.  Palpitations.   Shortness of breath, especially with exercise.   Paleness.  Cold sensitivity.  Indigestion.  Nausea.  Difficulty sleeping.  Difficulty concentrating. Symptoms may occur suddenly or they may develop slowly.  DIAGNOSIS  Additional blood  tests are often needed. These help your health care provider determine the best treatment. Your health care provider will check your stool for blood and look for other causes of blood loss.  TREATMENT  Treatment varies depending on the cause of the anemia. Treatment can include:   Supplements of iron, vitamin B12, or folic acid.   Hormone medicines.   A blood transfusion. This may be needed if blood loss is severe.   Hospitalization. This may be needed if there is significant continual blood loss.   Dietary changes.  Spleen removal. HOME CARE INSTRUCTIONS Keep all follow-up appointments. It often takes many weeks to correct anemia, and having your health care provider check on your condition and your response to treatment is very important. SEEK IMMEDIATE MEDICAL CARE IF:   You develop extreme weakness, shortness of breath, or chest pain.   You become dizzy or have trouble concentrating.  You develop heavy vaginal bleeding.   You develop a rash.   You have bloody or black, tarry stools.   You faint.   You vomit up blood.   You vomit repeatedly.   You have abdominal pain.  You have a fever or persistent symptoms for more than 2-3 days.   You have a fever and your symptoms suddenly get worse.   You are dehydrated.  MAKE SURE YOU:  Understand these instructions.  Will watch your condition.  Will get help right away if you are not doing well or get worse.   This information is not intended to replace advice given to you  by your health care provider. Make sure you discuss any questions you have with your health care provider.   Document Released: 09/02/2004 Document Revised: 03/28/2013 Document Reviewed: 01/19/2013 Elsevier Interactive Patient Education [employer]

## 2015-09-16 NOTE — ED Provider Notes (Signed)
I discussed pt with Dr Lowell Guitar, including hgb 7/4, unchanged from last prior hgb, and plan for follow up, as well as plan for missed hd today.  He indicates pt has clotted graft/access and that arrangements have been made for her to go to their outpatient vascular assess center tomorrow morning at 730.  He indicates they and/or pcp will follow up re anemia workup.   Discussed above plan with pt.    Pt denies faintness or dizziness. No sob. Is ambulatory w steady gait. No increased wob.       Cathren Laine, MD 09/16/15 2027

## 2015-09-16 NOTE — ED Notes (Addendum)
Pt states she was at dialysis today (goes Tue/Thur/Sat) and was told by MD there that her hgb was very low and to come to ER. Pt denies dizziness or lightheadedness. Only complaint is lack of appetite and states, "I've only been surviving on ensure.". Denies pain. Denies bloody stools.

## 2015-09-16 NOTE — ED Notes (Signed)
Per pt request, RN spoke to family member via phone and explained that pt was ready for discharge.

## 2015-09-16 NOTE — ED Notes (Signed)
Call from PCP. Hgb 6

## 2015-09-17 ENCOUNTER — Encounter (HOSPITAL_COMMUNITY): Payer: Medicare Other

## 2015-09-18 ENCOUNTER — Encounter (HOSPITAL_COMMUNITY): Payer: Medicare Other

## 2015-09-24 ENCOUNTER — Ambulatory Visit (HOSPITAL_COMMUNITY)
Admission: RE | Admit: 2015-09-24 | Discharge: 2015-09-24 | Disposition: A | Discharge status: Home / Self Care | Payer: Medicare Other | Source: Ambulatory Visit | Attending: Nephrology | Admitting: Nephrology

## 2015-09-24 DIAGNOSIS — D631 Anemia in chronic kidney disease: Secondary | ICD-10-CM | POA: Diagnosis not present

## 2015-09-24 DIAGNOSIS — N189 Chronic kidney disease, unspecified: Secondary | ICD-10-CM | POA: Insufficient documentation

## 2015-09-24 LAB — PREPARE RBC (CROSSMATCH)

## 2015-09-24 LAB — ABO/RH: ABO/RH(D): B POS

## 2015-09-24 MED ORDER — SODIUM CHLORIDE 0.9 % IV SOLN
Freq: Once | INTRAVENOUS | Status: AC
  Administered 2015-09-24: 13:00:00 via INTRAVENOUS

## 2015-09-24 MED ORDER — ACETAMINOPHEN 500 MG PO TABS
500.0000 mg | ORAL_TABLET | Freq: Once | ORAL | Status: AC
  Administered 2015-09-24: 500 mg via ORAL
  Filled 2015-09-24 (×2): qty 1

## 2015-09-24 MED ORDER — DIPHENHYDRAMINE HCL 50 MG/ML IJ SOLN
25.0000 mg | Freq: Once | INTRAMUSCULAR | Status: AC
  Administered 2015-09-24: 25 mg via INTRAVENOUS
  Filled 2015-09-24: qty 1

## 2015-09-24 NOTE — Progress Notes (Signed)
Dx: Anemia of chronic disease:  ICD-9-CM:285.29                                                     ICD-10-CM: D63.8  Provider:  Coladonato, J.  Procedure: Pt was typed and crossed matched and received 2 units of PRBCs  Pt tolerated the procedure well.  Condition post procedure: pt alert, oriented and ambulatory

## 2015-09-25 LAB — TYPE AND SCREEN
ABO/RH(D): B POS
Antibody Screen: NEGATIVE
UNIT DIVISION: 0
UNIT DIVISION: 0

## 2015-11-24 ENCOUNTER — Other Ambulatory Visit: Payer: Self-pay | Admitting: Internal Medicine

## 2015-11-25 ENCOUNTER — Telehealth: Payer: Self-pay | Admitting: Internal Medicine

## 2015-11-26 NOTE — Telephone Encounter (Signed)
Spoke with Mrs. Natalie Wolfe, stated she is not interested in adjustable wrist brace for wrist instability relief, from [facility]. [facility]

## 2016-01-16 ENCOUNTER — Other Ambulatory Visit: Payer: Self-pay | Admitting: Internal Medicine

## 2016-04-19 ENCOUNTER — Emergency Department (HOSPITAL_COMMUNITY): Payer: Medicare Other

## 2016-04-19 ENCOUNTER — Inpatient Hospital Stay (HOSPITAL_COMMUNITY)
Admission: EM | Admit: 2016-04-19 | Discharge: 2016-04-23 | DRG: 640 | Disposition: A | Discharge status: Skilled Nursing Facility | Payer: Medicare Other | Attending: Internal Medicine | Admitting: Internal Medicine

## 2016-04-19 ENCOUNTER — Encounter (HOSPITAL_COMMUNITY): Payer: Self-pay

## 2016-04-19 DIAGNOSIS — N186 End stage renal disease: Secondary | ICD-10-CM | POA: Diagnosis present

## 2016-04-19 DIAGNOSIS — D72819 Decreased white blood cell count, unspecified: Secondary | ICD-10-CM | POA: Diagnosis present

## 2016-04-19 DIAGNOSIS — N2581 Secondary hyperparathyroidism of renal origin: Secondary | ICD-10-CM | POA: Diagnosis present

## 2016-04-19 DIAGNOSIS — E875 Hyperkalemia: Secondary | ICD-10-CM | POA: Diagnosis present

## 2016-04-19 DIAGNOSIS — Z992 Dependence on renal dialysis: Secondary | ICD-10-CM

## 2016-04-19 DIAGNOSIS — Z66 Do not resuscitate: Secondary | ICD-10-CM | POA: Diagnosis not present

## 2016-04-19 DIAGNOSIS — IMO0002 Reserved for concepts with insufficient information to code with codable children: Secondary | ICD-10-CM | POA: Diagnosis present

## 2016-04-19 DIAGNOSIS — Z7189 Other specified counseling: Secondary | ICD-10-CM

## 2016-04-19 DIAGNOSIS — R531 Weakness: Secondary | ICD-10-CM

## 2016-04-19 DIAGNOSIS — E1165 Type 2 diabetes mellitus with hyperglycemia: Secondary | ICD-10-CM

## 2016-04-19 DIAGNOSIS — F0390 Unspecified dementia without behavioral disturbance: Secondary | ICD-10-CM | POA: Diagnosis present

## 2016-04-19 DIAGNOSIS — D696 Thrombocytopenia, unspecified: Secondary | ICD-10-CM | POA: Diagnosis present

## 2016-04-19 DIAGNOSIS — R627 Adult failure to thrive: Secondary | ICD-10-CM

## 2016-04-19 DIAGNOSIS — E1151 Type 2 diabetes mellitus with diabetic peripheral angiopathy without gangrene: Secondary | ICD-10-CM | POA: Diagnosis present

## 2016-04-19 DIAGNOSIS — E785 Hyperlipidemia, unspecified: Secondary | ICD-10-CM | POA: Diagnosis present

## 2016-04-19 DIAGNOSIS — Z515 Encounter for palliative care: Secondary | ICD-10-CM | POA: Diagnosis not present

## 2016-04-19 DIAGNOSIS — Z87891 Personal history of nicotine dependence: Secondary | ICD-10-CM | POA: Diagnosis not present

## 2016-04-19 DIAGNOSIS — E860 Dehydration: Secondary | ICD-10-CM | POA: Diagnosis present

## 2016-04-19 DIAGNOSIS — E1129 Type 2 diabetes mellitus with other diabetic kidney complication: Secondary | ICD-10-CM | POA: Diagnosis present

## 2016-04-19 DIAGNOSIS — I10 Essential (primary) hypertension: Secondary | ICD-10-CM | POA: Diagnosis present

## 2016-04-19 DIAGNOSIS — I132 Hypertensive heart and chronic kidney disease with heart failure and with stage 5 chronic kidney disease, or end stage renal disease: Secondary | ICD-10-CM | POA: Diagnosis present

## 2016-04-19 DIAGNOSIS — E162 Hypoglycemia, unspecified: Secondary | ICD-10-CM

## 2016-04-19 DIAGNOSIS — Z681 Body mass index (BMI) 19 or less, adult: Secondary | ICD-10-CM

## 2016-04-19 DIAGNOSIS — Z9115 Patient's noncompliance with renal dialysis: Secondary | ICD-10-CM

## 2016-04-19 DIAGNOSIS — E872 Acidosis: Secondary | ICD-10-CM | POA: Diagnosis present

## 2016-04-19 DIAGNOSIS — E1122 Type 2 diabetes mellitus with diabetic chronic kidney disease: Secondary | ICD-10-CM | POA: Diagnosis present

## 2016-04-19 DIAGNOSIS — E11649 Type 2 diabetes mellitus with hypoglycemia without coma: Secondary | ICD-10-CM | POA: Diagnosis present

## 2016-04-19 DIAGNOSIS — T68XXXA Hypothermia, initial encounter: Secondary | ICD-10-CM

## 2016-04-19 DIAGNOSIS — N19 Unspecified kidney failure: Secondary | ICD-10-CM | POA: Diagnosis present

## 2016-04-19 DIAGNOSIS — E871 Hypo-osmolality and hyponatremia: Secondary | ICD-10-CM | POA: Diagnosis present

## 2016-04-19 DIAGNOSIS — I5033 Acute on chronic diastolic (congestive) heart failure: Secondary | ICD-10-CM | POA: Diagnosis present

## 2016-04-19 DIAGNOSIS — E1121 Type 2 diabetes mellitus with diabetic nephropathy: Secondary | ICD-10-CM | POA: Diagnosis not present

## 2016-04-19 LAB — CBC WITH DIFFERENTIAL/PLATELET
BASOS ABS: 0 10*3/uL (ref 0.0–0.1)
Basophils Relative: 0 %
EOS ABS: 0.1 10*3/uL (ref 0.0–0.7)
EOS PCT: 1 %
HCT: 36.9 % (ref 36.0–46.0)
Hemoglobin: 12.2 g/dL (ref 12.0–15.0)
Lymphocytes Relative: 18 %
Lymphs Abs: 0.7 10*3/uL (ref 0.7–4.0)
MCH: 31.8 pg (ref 26.0–34.0)
MCHC: 33.1 g/dL (ref 30.0–36.0)
MCV: 96.1 fL (ref 78.0–100.0)
MONO ABS: 0.3 10*3/uL (ref 0.1–1.0)
Monocytes Relative: 8 %
Neutro Abs: 2.9 10*3/uL (ref 1.7–7.7)
Neutrophils Relative %: 73 %
PLATELETS: 147 10*3/uL — AB (ref 150–400)
RBC: 3.84 MIL/uL — AB (ref 3.87–5.11)
RDW: 16.4 % — AB (ref 11.5–15.5)
WBC: 3.9 10*3/uL — AB (ref 4.0–10.5)

## 2016-04-19 LAB — GLUCOSE, CAPILLARY: GLUCOSE-CAPILLARY: 156 mg/dL — AB (ref 65–99)

## 2016-04-19 LAB — COMPREHENSIVE METABOLIC PANEL
ALK PHOS: 91 U/L (ref 38–126)
ALT: 26 U/L (ref 14–54)
AST: 25 U/L (ref 15–41)
Albumin: 3.5 g/dL (ref 3.5–5.0)
Anion gap: 27 — ABNORMAL HIGH (ref 5–15)
BILIRUBIN TOTAL: 1.4 mg/dL — AB (ref 0.3–1.2)
BUN: 171 mg/dL — AB (ref 6–20)
CO2: 17 mmol/L — ABNORMAL LOW (ref 22–32)
CREATININE: 19.3 mg/dL — AB (ref 0.44–1.00)
Calcium: 9.3 mg/dL (ref 8.9–10.3)
Chloride: 85 mmol/L — ABNORMAL LOW (ref 101–111)
GFR calc Af Amer: 2 mL/min — ABNORMAL LOW (ref >60)
GFR, EST NON AFRICAN AMERICAN: 1 mL/min — AB (ref >60)
GLUCOSE: 59 mg/dL — AB (ref 65–99)
Potassium: 5.9 mmol/L — ABNORMAL HIGH (ref 3.5–5.1)
SODIUM: 129 mmol/L — AB (ref 135–145)
TOTAL PROTEIN: 7.6 g/dL (ref 6.5–8.1)

## 2016-04-19 LAB — I-STAT CHEM 8, ED
BUN: >140 mg/dL — ABNORMAL HIGH (ref 6–20)
CALCIUM ION: 0.97 mmol/L — AB (ref 1.15–1.40)
CHLORIDE: 89 mmol/L — AB (ref 101–111)
Creatinine, Ser: >18 mg/dL — ABNORMAL HIGH (ref 0.44–1.00)
GLUCOSE: 55 mg/dL — AB (ref 65–99)
HCT: 42 % (ref 36.0–46.0)
Hemoglobin: 14.3 g/dL (ref 12.0–15.0)
Potassium: 5.6 mmol/L — ABNORMAL HIGH (ref 3.5–5.1)
SODIUM: 126 mmol/L — AB (ref 135–145)
TCO2: 20 mmol/L (ref 0–100)

## 2016-04-19 LAB — CBG MONITORING, ED: GLUCOSE-CAPILLARY: 49 mg/dL — AB (ref 65–99)

## 2016-04-19 LAB — PROTIME-INR
INR: 1.22
PROTHROMBIN TIME: 15.4 s — AB (ref 11.4–15.2)

## 2016-04-19 LAB — APTT: aPTT: 50 seconds — ABNORMAL HIGH (ref 24–36)

## 2016-04-19 LAB — POTASSIUM: Potassium: 5.3 mmol/L — ABNORMAL HIGH (ref 3.5–5.1)

## 2016-04-19 LAB — I-STAT CG4 LACTIC ACID, ED: LACTIC ACID, VENOUS: 1.65 mmol/L (ref 0.5–1.9)

## 2016-04-19 MED ORDER — ACETAMINOPHEN 650 MG RE SUPP: 650.0000 mg | Freq: Four times a day (QID) | RECTAL | Status: DC | PRN

## 2016-04-19 MED ORDER — ENSURE PO LIQD: 237.0000 mL | Freq: Three times a day (TID) | ORAL | Status: DC

## 2016-04-19 MED ORDER — ACETAMINOPHEN 325 MG PO TABS
650.0000 mg | ORAL_TABLET | Freq: Four times a day (QID) | ORAL | Status: DC | PRN
  Administered 2016-04-23: 650 mg via ORAL
  Filled 2016-04-19: qty 2

## 2016-04-19 MED ORDER — RENA-VITE PO TABS: 1.0000 | ORAL_TABLET | Freq: Every day | ORAL | Status: DC

## 2016-04-19 MED ORDER — SODIUM CHLORIDE 0.9% FLUSH
3.0000 mL | Freq: Two times a day (BID) | INTRAVENOUS | Status: DC
  Administered 2016-04-20: 3 mL via INTRAVENOUS

## 2016-04-19 MED ORDER — ONDANSETRON HCL 4 MG/2ML IJ SOLN: 4.0000 mg | Freq: Four times a day (QID) | INTRAMUSCULAR | Status: DC | PRN

## 2016-04-19 MED ORDER — SODIUM CHLORIDE 0.9 % IV SOLN: 250.0000 mL | INTRAVENOUS | Status: DC | PRN

## 2016-04-19 MED ORDER — ONDANSETRON HCL 4 MG PO TABS: 4.0000 mg | ORAL_TABLET | Freq: Four times a day (QID) | ORAL | Status: DC | PRN

## 2016-04-19 MED ORDER — AMLODIPINE BESYLATE 10 MG PO TABS
10.0000 mg | ORAL_TABLET | Freq: Every day | ORAL | Status: DC
  Administered 2016-04-20 – 2016-04-22 (×4): 10 mg via ORAL
  Filled 2016-04-19 (×3): qty 1
  Filled 2016-04-19: qty 2

## 2016-04-19 MED ORDER — POLYETHYLENE GLYCOL 3350 17 G PO PACK: 17.0000 g | PACK | Freq: Every day | ORAL | Status: DC | PRN

## 2016-04-19 MED ORDER — FLUOCINONIDE 0.05 % EX CREA: 1.0000 "application " | TOPICAL_CREAM | Freq: Two times a day (BID) | CUTANEOUS | Status: DC

## 2016-04-19 MED ORDER — HYDRALAZINE HCL 25 MG PO TABS
25.0000 mg | ORAL_TABLET | Freq: Three times a day (TID) | ORAL | Status: DC
  Administered 2016-04-20 – 2016-04-23 (×10): 25 mg via ORAL
  Filled 2016-04-19 (×10): qty 1

## 2016-04-19 MED ORDER — SODIUM CHLORIDE 0.9% FLUSH
3.0000 mL | Freq: Two times a day (BID) | INTRAVENOUS | Status: DC
  Administered 2016-04-20 – 2016-04-22 (×2): 3 mL via INTRAVENOUS

## 2016-04-19 MED ORDER — DEXTROSE 5 % IV SOLN
Freq: Once | INTRAVENOUS | Status: AC
  Administered 2016-04-19: 20:00:00 via INTRAVENOUS

## 2016-04-19 MED ORDER — CALCIUM GLUCONATE 10 % IV SOLN
1.0000 g | Freq: Once | INTRAVENOUS | Status: AC
  Administered 2016-04-19: 1 g via INTRAVENOUS
  Filled 2016-04-19: qty 10

## 2016-04-19 MED ORDER — SODIUM CHLORIDE 0.9% FLUSH: 3.0000 mL | INTRAVENOUS | Status: DC | PRN

## 2016-04-19 MED ORDER — DEXTROSE 50 % IV SOLN
1.0000 | Freq: Once | INTRAVENOUS | Status: AC
  Administered 2016-04-19: 50 mL via INTRAVENOUS
  Filled 2016-04-19: qty 50

## 2016-04-19 MED ORDER — INSULIN ASPART 100 UNIT/ML ~~LOC~~ SOLN: 5.0000 [IU] | Freq: Once | SUBCUTANEOUS | Status: DC

## 2016-04-19 MED ORDER — NEPRO/CARBSTEADY PO LIQD: 237.0000 mL | ORAL | Status: DC | PRN

## 2016-04-19 MED ORDER — HYDROCORTISONE 2.5 % EX LOTN: 1.0000 "application " | TOPICAL_LOTION | Freq: Every day | CUTANEOUS | Status: DC

## 2016-04-19 NOTE — ED Notes (Signed)
Dialysis called for report. Will get from floor when blood sugar is within normal limits.

## 2016-04-19 NOTE — ED Provider Notes (Signed)
[facility]-EMERGENCY DEPT Provider Note   CSN: [id number] Arrival date & time: 04/19/16  1633     History   Chief Complaint Chief Complaint  Patient presents with  . Hypertension    HPI Natalie Wolfe is a 82 y.o. female.  HPI   Pt with hx dementia, ESRD on dialysis, CHF, DM p/w generalized weakness, abdominal pain.  Pt was brought in by EMS after cousin was altered that patient has not been to dialysis in two weeks.  Cousin states pt insists on living alone and he has difficulty getting into the building to check on her.  He knows she does not eat and drink well and may not be taking her medications.    Pt states that she has had urinary frequency and abdominal pain.  Has fallen today.  She denies any pain from the fall.  Cousin confirms that she falls often.  She does not remember the last time she went to dialysis.  Cousin Greggory Stallion) confirms that patient's level of confusion is her baseline.  Level V caveat for dementia.    Past Medical History:  Diagnosis Date  . Anemia, unspecified   . Cataract   . CHF (congestive heart failure) (HCC)   . Chronic kidney disease, stage II (mild)    [facility]  . Dementia    forgetful, .  . Failure to thrive in adult 03/09/2015  . Insomnia, unspecified   . Memory loss 02/10/2015  . Nocturia   . Obesity, unspecified   . Other and unspecified hyperlipidemia   . Other anxiety states   . Other atopic dermatitis and related conditions   . Pain in joint, lower leg   . PVD (peripheral vascular disease) (HCC)   . Rash and other nonspecific skin eruption   . Routine general medical examination at a health care facility   . Thrombocytopenia (HCC)   . Type II or unspecified type diabetes mellitus with renal manifestations, not stated as uncontrolled   . Unspecified essential hypertension   . Unspecified vitamin D deficiency   . Urinary frequency     Patient Active Problem List   Diagnosis Date Noted  . ESRD  needing dialysis (HCC) 04/19/2016  . Leukopenia 04/19/2016  . Uremia 04/19/2016  . Hypothermia 04/19/2016  . Uremia of renal origin 04/19/2016  . Dehydration   . Hypoglycemia 03/12/2015  . Failure to thrive in adult 03/09/2015  . Memory loss 02/10/2015  . Depression 02/05/2015  . Hyperkalemia 01/31/2015  . Pulmonary edema 12/23/2014  . Protein-calorie malnutrition, severe (HCC) 12/23/2014  . Anorexia 10/23/2014  . Normal coronary arteries 09/16/2014 09/17/2014  . Congestive dilated cardiomyopathy-EF 40-45% at cath, 35% by echo   . Troponin level elevated-0.2 09/13/2014  . Syncope 09/12/2014  . ESRD (end stage renal disease) (HCC) 09/12/2014  . Thrombocytopenia (HCC) 09/12/2014  . Cardiopulmonary arrest (HCC) 09/12/2014  . SOB (shortness of breath)   . Overactive bladder 01/14/2013  . Unspecified vitamin D deficiency 01/14/2013  . Anemia of chronic disease 01/14/2013  . Arthritis of knee 01/14/2013  . Essential hypertension 11/27/2012  . Hyperlipemia 11/27/2012    Past Surgical History:  Procedure Laterality Date  . ABDOMINAL HYSTERECTOMY  1970  . AV FISTULA PLACEMENT Left 09/06/2014   Procedure: CREATION OF LEFT UPPER ARM ARTERIOVENOUS (AV) FISTULA ;  Surgeon: Pryor Ochoa, MD;  Location: [facility] OR;  Service: Vascular;  Laterality: Left;  . BREAST SURGERY  1979   nodule removed  . EYE SURGERY  2004  catarcts removed from right eye.  Marland Kitchen FALSE ANEURYSM REPAIR Left 03/07/2015   Procedure: REPAIR PSEUDOANEURYSMS OF LEFT ARM ARTERIOVENOUS FISTULA;  Surgeon: Chuck Hint, MD;  Location: [facility] OR;  Service: Vascular;  Laterality: Left;  . INSERTION OF DIALYSIS CATHETER N/A 09/06/2014   Procedure: INSERTION OF DIALYSIS CATHETER RIGHT INTERNAL JUGULAR VEIN;  Surgeon: Pryor Ochoa, MD;  Location: [facility] OR;  Service: Vascular;  Laterality: N/A;  . LEFT HEART CATHETERIZATION WITH CORONARY ANGIOGRAM N/A 09/17/2014   Procedure: LEFT HEART CATHETERIZATION WITH CORONARY ANGIOGRAM;  Surgeon:  Lennette Bihari, MD;  Location: [facility] CATH LAB;  Service: Cardiovascular;  Laterality: N/A;  . LIGATION OF COMPETING BRANCHES OF ARTERIOVENOUS FISTULA Left 09/06/2014   Procedure: LIGATION OF COMPETING BRANCHES OF ARTERIOVENOUS FISTULA;  Surgeon: Pryor Ochoa, MD;  Location: [facility] OR;  Service: Vascular;  Laterality: Left;  Marland Kitchen MULTIPLE TOOTH EXTRACTIONS  09/2013   Remonve remaining 3 teeth, no with dentures     OB History    No data available       Home Medications    Prior to Admission medications   Medication Sig Start Date End Date Taking? Authorizing Provider  amLODipine (NORVASC) 10 MG tablet Take 10 mg by mouth at bedtime.    Historical Provider, MD  carvedilol (COREG) 12.5 MG tablet One twice daily to strengthen the heart and control BP Patient not taking: Reported on 04/19/2016 02/12/15   [name], MD  docusate sodium (COLACE) 100 MG capsule Take 1 capsule (100 mg total) by mouth daily. To prevent constipation Patient not taking: Reported on 04/19/2016 09/06/15   Laurence Spates, MD  ENSURE (ENSURE) Take 237 mLs by mouth 3 (three) times daily between meals.    Historical Provider, MD  ferrous sulfate 325 (65 FE) MG tablet Take 1 tablet (325 mg total) by mouth daily. Patient not taking: Reported on 04/19/2016 09/06/15   Laurence Spates, MD  fluocinonide cream (LIDEX) 0.05 % Apply 1 application topically 2 (two) times daily. Apply from neck down twice a day (provided by dermatology).    Historical Provider, MD  hydrALAZINE (APRESOLINE) 25 MG tablet Take 1 tablet (25 mg total) by mouth every 8 (eight) hours. Patient not taking: Reported on 04/19/2016 11/07/14   [name], MD  hydrocortisone 2.5 % lotion Apply 1 application topically daily. Apply to face 09/05/15   Historical Provider, MD  lidocaine-prilocaine (EMLA) cream Apply 1 application topically daily as needed (prior to dialysis).     Historical Provider, MD  multivitamin (RENA-VIT) TABS tablet Take 1 tablet by mouth at  bedtime. Patient not taking: Reported on 04/19/2016 03/15/15   Penny Pia, MD  Nutritional Supplements (FEEDING SUPPLEMENT, NEPRO CARB STEADY,) LIQD Take 237 mLs by mouth as needed (missed meal during dialysis.). Patient not taking: Reported on 04/19/2016 03/15/15   Penny Pia, MD  sertraline (ZOLOFT) 50 MG tablet Take 1 tablet (50 mg total) by mouth daily. Patient must schedule and keep appointment for before any more refills. Patient not taking: Reported on 04/19/2016 01/16/16   [name], MD    Family History Family History  Problem Relation Age of Onset  . Diabetes Mother     Social History Social History  Substance Use Topics  . Smoking status: Former Smoker    Types: Cigarettes    Quit date: 08/10/2000  . Smokeless tobacco: Never Used  . Alcohol use No     Allergies   Heparin   Review of Systems Review of  Systems  Unable to perform ROS: Dementia     Physical Exam Updated Vital Signs BP (!) 186/73   Pulse 74   Temp 97.5 F (36.4 C) (Oral)   Resp 18   Ht 5\' 3"  (1.6 m)   Wt 45 kg   SpO2 99%   BMI 17.57 kg/m   Physical Exam  Constitutional: She appears well-developed and well-nourished. No distress.  HENT:  Head: Normocephalic and atraumatic.  Mouth/Throat: Mucous membranes are dry.  Neck: Neck supple.  Cardiovascular: Normal rate and regular rhythm.   Left upper arm with AV fistula, palpable thrill   Pulmonary/Chest: Effort normal and breath sounds normal. No respiratory distress. She has no wheezes. She has no rales.  Abdominal: Soft. She exhibits no distension. There is tenderness. There is no rebound and no guarding.  Musculoskeletal: She exhibits no edema, tenderness or deformity.  Neurological: She is alert.  Pt initially tells me she is at [facility] and lives in [place], later is able to tell me she is in [place] and lives in [place].  She cannot tell me the date or the year, the president.    Skin: She is not diaphoretic.    Nursing note and vitals reviewed.    ED Treatments / Results  Labs (all labs ordered are listed, but only abnormal results are displayed) Labs Reviewed  CBC WITH DIFFERENTIAL/PLATELET - Abnormal; Notable for the following:       Result Value   WBC 3.9 (*)    RBC 3.84 (*)    RDW 16.4 (*)    Platelets 147 (*)    All other components within normal limits  COMPREHENSIVE METABOLIC PANEL - Abnormal; Notable for the following:    Sodium 129 (*)    Potassium 5.9 (*)    Chloride 85 (*)    CO2 17 (*)    Glucose, Bld 59 (*)    BUN 171 (*)    Creatinine, Ser 19.30 (*)    Total Bilirubin 1.4 (*)    GFR calc non Af Amer 1 (*)    GFR calc Af Amer 2 (*)    Anion gap 27 (*)    All other components within normal limits  PROTIME-INR - Abnormal; Notable for the following:    Prothrombin Time 15.4 (*)    All other components within normal limits  APTT - Abnormal; Notable for the following:    aPTT 50 (*)    All other components within normal limits  POTASSIUM - Abnormal; Notable for the following:    Potassium 5.3 (*)    All other components within normal limits  GLUCOSE, CAPILLARY - Abnormal; Notable for the following:    Glucose-Capillary 156 (*)    All other components within normal limits  I-STAT CHEM 8, ED - Abnormal; Notable for the following:    Sodium 126 (*)    Potassium 5.6 (*)    Chloride 89 (*)    BUN >140 (*)    Creatinine, Ser >18.00 (*)    Glucose, Bld 55 (*)    Calcium, Ion 0.97 (*)    All other components within normal limits  CBG MONITORING, ED - Abnormal; Notable for the following:    Glucose-Capillary 49 (*)    All other components within normal limits  MRSA PCR SCREENING  URINE CULTURE  CULTURE, BLOOD (ROUTINE X 2)  CULTURE, BLOOD (ROUTINE X 2)  GLUCOSE, CAPILLARY  URINALYSIS, ROUTINE W REFLEX MICROSCOPIC (NOT AT [facility])  CBC  POTASSIUM  POTASSIUM  RENAL FUNCTION PANEL  POTASSIUM  POTASSIUM  POTASSIUM  I-STAT CG4 LACTIC ACID, ED    EKG  EKG  Interpretation None       Radiology Dg Chest 2 View  Result Date: 04/19/2016 CLINICAL DATA:  Missed dialysis for the past 2 weeks. Chronic kidney disease. Diabetes. EXAM: CHEST  2 VIEW COMPARISON:  04/16/2015. FINDINGS: Mildly progressive enlargement of the cardiac silhouette. Mildly increased prominence of the pulmonary vasculature and interstitial markings. Small bilateral pleural effusions. Diffuse osteopenia. Left axillary and upper arm stent. IMPRESSION: Mild changes of congestive heart failure. Electronically Signed   By: Beckie SaltsSteven  Reid M.D.   On: 04/19/2016 17:39   Ct Head Wo Contrast  Result Date: 04/19/2016 CLINICAL DATA:  82 year old diabetic female on dialysis. Weakness. No dialysis for the past 2 weeks. Initial encounter. EXAM: CT HEAD WITHOUT CONTRAST CT CERVICAL SPINE WITHOUT CONTRAST TECHNIQUE: Multidetector CT imaging of the head and cervical spine was performed following the standard protocol without intravenous contrast. Multiplanar CT image reconstructions of the cervical spine were also generated. COMPARISON:  03/12/2015 CT head and cervical spine. FINDINGS: CT HEAD FINDINGS Brain: No intracranial hemorrhage or CT evidence of large acute infarct. Remote right cerebellar infarct.  Chronic microvascular changes. Global atrophy without hydrocephalus. No intracranial mass lesion noted on this unenhanced exam. Vascular: Vascular calcifications. Skull: No worrisome abnormality. Sinuses/Orbits: Minimal mucosal thickening sphenoid sinus air cells bilaterally. Other: Negative. CT CERVICAL SPINE FINDINGS Alignment: No cervical spine fracture or malalignment. Degenerative changes cervical spine similar to prior exam without destruction to suggest acute infection. Cervical spondylotic changes contribute to various degrees of spinal stenosis/ foraminal narrowing most notable on the left at the C5-6 level. No worrisome neck mass.  Carotid bifurcation calcifications. No worrisome upper lung mass.  IMPRESSION: CT HEAD No intracranial hemorrhage or CT evidence of large acute infarct. Remote right cerebellar infarct. Chronic microvascular changes. Global atrophy without hydrocephalus. Minimal mucosal thickening sphenoid sinus air cells bilaterally. CT CERVICAL SPINE No cervical spine fracture or malalignment. Degenerative changes cervical spine similar to prior exam. Electronically Signed   By: Lacy DuverneySteven  Olson M.D.   On: 04/19/2016 17:35   Ct Cervical Spine Wo Contrast  Result Date: 04/19/2016 CLINICAL DATA:  82 year old diabetic female on dialysis. Weakness. No dialysis for the past 2 weeks. Initial encounter. EXAM: CT HEAD WITHOUT CONTRAST CT CERVICAL SPINE WITHOUT CONTRAST TECHNIQUE: Multidetector CT imaging of the head and cervical spine was performed following the standard protocol without intravenous contrast. Multiplanar CT image reconstructions of the cervical spine were also generated. COMPARISON:  03/12/2015 CT head and cervical spine. FINDINGS: CT HEAD FINDINGS Brain: No intracranial hemorrhage or CT evidence of large acute infarct. Remote right cerebellar infarct.  Chronic microvascular changes. Global atrophy without hydrocephalus. No intracranial mass lesion noted on this unenhanced exam. Vascular: Vascular calcifications. Skull: No worrisome abnormality. Sinuses/Orbits: Minimal mucosal thickening sphenoid sinus air cells bilaterally. Other: Negative. CT CERVICAL SPINE FINDINGS Alignment: No cervical spine fracture or malalignment. Degenerative changes cervical spine similar to prior exam without destruction to suggest acute infection. Cervical spondylotic changes contribute to various degrees of spinal stenosis/ foraminal narrowing most notable on the left at the C5-6 level. No worrisome neck mass.  Carotid bifurcation calcifications. No worrisome upper lung mass. IMPRESSION: CT HEAD No intracranial hemorrhage or CT evidence of large acute infarct. Remote right cerebellar infarct. Chronic  microvascular changes. Global atrophy without hydrocephalus. Minimal mucosal thickening sphenoid sinus air cells bilaterally. CT CERVICAL SPINE No cervical spine fracture or malalignment. Degenerative changes cervical  spine similar to prior exam. Electronically Signed   By: Lacy Duverney M.D.   On: 04/19/2016 17:35    Procedures Procedures (including critical care time)  Medications Ordered in ED Medications  amLODipine (NORVASC) tablet 10 mg (10 mg Oral Given 04/20/16 0111)  hydrALAZINE (APRESOLINE) tablet 25 mg (25 mg Oral Given 04/20/16 0111)  sodium chloride flush (NS) 0.9 % injection 3 mL (3 mLs Intravenous Given 04/20/16 0113)  sodium chloride flush (NS) 0.9 % injection 3 mL (3 mLs Intravenous Not Given 04/19/16 2200)  sodium chloride flush (NS) 0.9 % injection 3 mL (not administered)  0.9 %  sodium chloride infusion (not administered)  acetaminophen (TYLENOL) tablet 650 mg (not administered)    Or  acetaminophen (TYLENOL) suppository 650 mg (not administered)  ondansetron (ZOFRAN) tablet 4 mg (not administered)    Or  ondansetron (ZOFRAN) injection 4 mg (not administered)  polyethylene glycol (MIRALAX / GLYCOLAX) packet 17 g (not administered)  calcium gluconate 1 g in sodium chloride 0.9 % 100 mL IVPB (1 g Intravenous New Bag/Given 04/19/16 2001)  dextrose 50 % solution 50 mL (50 mLs Intravenous Given 04/19/16 2001)  dextrose 5 % solution ( Intravenous New Bag/Given 04/19/16 1957)     Initial Impression / Assessment and Plan / ED Course  I have reviewed the triage vital signs and the nursing notes.  Pertinent labs & imaging results that were available during my care of the patient were reviewed by me and considered in my medical decision making (see chart for details).  Clinical Course  Comment By Time  Discussed pt with Dr Eliott Nine, nephrology, who will arrange for short dialysis this evening.  Pt will need to be admitted and will have several rounds of dialysis.   Trixie Dredge, PA-C  09/11 1745    Patient with dementia, lives alone, no dialysis in 2 weeks p/w generalized weakness, falls, abdominal pain, dysuria.  She is very dry appearing, has had decreased PO intake.  In need of dialysis - potassium is 5.9, BUN/creat >140, >18.  Pt to be taken for dialysis though per Dr Eliott Nine will need have repeated short dialysis sessions.  Admitted to Dr Antionette Char, [facility].    Final Clinical Impressions(s) / ED Diagnoses   Final diagnoses:  ESRD needing dialysis [facility])  Generalized weakness  Hypoglycemia  Dehydration    New Prescriptions Current Discharge Medication List       Trixie Dredge, [place] 04/20/16 0135    Shaune Pollack, MD 04/20/16 1334

## 2016-04-19 NOTE — ED Triage Notes (Signed)
Pt. Family called out because they were contacted by dialysis center alerting them that she hadn't been in 2 weeks. Pt. Also not compliant with her medications per EMS report and has dementia

## 2016-04-19 NOTE — Progress Notes (Signed)
HD communicated with 2C bedside RN.  Adelina MingsKelsey, RN to call HD when patients sugar has stabilized.  Pt to be dialyzed at that point.

## 2016-04-19 NOTE — Progress Notes (Signed)
[facility] Brief Note Rec'd call from the ED about this pt who has missed several HD TMT's Outpt records from her [place] reviewed:   She last attended HD on 04/06/16 at [facility]  Was called each time a TMT was missed and told staff "too weak to come to HD and would come when she felt like it" and that she "never wanted to go back to that hospital"  SW from the center met with pt.'s cousin, Iona Beard last week, to discuss concerns and he indicated that pt forgetful and intermittently confused, that she does not eat food, only drinks water, sprite, and ensure, has falls at home and does not want anyone to know. He reported that she would no longer allow him in her home to assist ADL's.   HD unit today summoned an ambulance to her home - brought to the ED - labs there showed K 5.9, bicarb 17, BUN 171, creatinine 19.3. (no other workup done yet)  Pt's usual dialysis prescription: TTS [place] [facility] 4 hours 300/A1.5 EDW 42 kg 2K 2.25 Ca bath Mircera 200 mcg Q2weeks (last dosed 8/29)  She will require HD tonight but because so azotemic would only be able to get a short, slow treatment tonight (to avoid disequilibrium).  Plan a 2.5 hour treatment at BFR/DFR 250/500, remove 1 liter.  If pt admitted will likely require serial HD for 2-3 days.  Jamal Maes, MD [facility] [phone] Pager 04/19/2016, 6:02 PM

## 2016-04-19 NOTE — Procedures (Signed)
Initiating HD Cannulation of L arm AVF currently Plan for 2.5 hour treatment tonight at low BFR/DFR  Natalie Balynthia Torri Langston, MD [facility] [phone] Pager 04/19/2016, 9:37 PM

## 2016-04-19 NOTE — H&P (Signed)
History and Physical    Natalie Wolfe ZOX:[id number] DOB: 23-May-1930 DOA: 04/19/2016  PCP: Murray Hodgkins, MD   Patient coming from: Home  Chief Complaint: Missed HD   HPI: Natalie Wolfe is a 82 y.o. female with medical history significant for dementia, end-stage renal disease on hemodialysis, and hypertension who presents the emergency department with EMS after missing her dialysis for close to 2 weeks. Patient lives alone and has a cousin who cares for her, but reports that the patient often does not let him into the building. She is reportedly eaten very little in recent weeks, there is suspicion that she has not taken her medications, and she has not been to dialysis since 04/06/2016. The dialysis center is reportedly been calling the patient encouraging her to come in, but she reports being too weak. EMS was activated today in order to get the patient to the emergency department for dialysis. She denies any complaints. Specifically, she denies chest pain, palpitations, cough, or dyspnea. Patient denies any abdominal pain, nausea, vomiting, or diarrhea. She denies depression or anxiety and denies suicidal ideations or hallucinations.  ED Course: Upon arrival to the ED, patient is found to be hypothermic to 36.1 C, saturating well on room air, hypertensive to 200/90, and with vitals otherwise stable. EKG demonstrates a sinus rhythm with LVH by voltage criteria and secondary repolarization abnormality. Chest x-ray suggest mild CHF and noncontrast CT of the head and cervical spine are negative for acute pathology. CMP features widespread derangements with hyponatremia, hyperkalemia, elevated anion gap metabolic acidosis, serum glucose of 55, and BUN of 171. CBC features a leukopenia with WBC 3900, and a mild thrombocytopenia with platelet count of 147,000. Lactic acid is reassuring at 1.65. Nephrology was consulted by the ED physician and has arranged for inpatient dialysis. Patient was  placed briefly on 5% dextrose infusion for hypoglycemia. She has remained hemodynamically stable in the emergency department and in no respiratory distress. She will be admitted to the stepdown unit for ongoing evaluation and management of uremia, hyperkalemia, and volume overload in the setting of missed dialysis sessions.  Review of Systems:  All other systems reviewed and apart from HPI, are negative.  Past Medical History:  Diagnosis Date  . Anemia, unspecified   . Cataract   . CHF (congestive heart failure) (HCC)   . Chronic kidney disease, stage II (mild)    [facility]  . Dementia    forgetful, .  . Failure to thrive in adult 03/09/2015  . Insomnia, unspecified   . Memory loss 02/10/2015  . Nocturia   . Obesity, unspecified   . Other and unspecified hyperlipidemia   . Other anxiety states   . Other atopic dermatitis and related conditions   . Pain in joint, lower leg   . PVD (peripheral vascular disease) (HCC)   . Rash and other nonspecific skin eruption   . Routine general medical examination at a health care facility   . Thrombocytopenia (HCC)   . Type II or unspecified type diabetes mellitus with renal manifestations, not stated as uncontrolled   . Unspecified essential hypertension   . Unspecified vitamin D deficiency   . Urinary frequency     Past Surgical History:  Procedure Laterality Date  . ABDOMINAL HYSTERECTOMY  1970  . AV FISTULA PLACEMENT Left 09/06/2014   Procedure: CREATION OF LEFT UPPER ARM ARTERIOVENOUS (AV) FISTULA ;  Surgeon: Pryor Ochoa, MD;  Location: [facility] OR;  Service: Vascular;  Laterality: Left;  . BREAST  SURGERY  1979   nodule removed  . EYE SURGERY  2004   catarcts removed from right eye.  Marland Kitchen FALSE ANEURYSM REPAIR Left 03/07/2015   Procedure: REPAIR PSEUDOANEURYSMS OF LEFT ARM ARTERIOVENOUS FISTULA;  Surgeon: Chuck Hint, MD;  Location: [facility] OR;  Service: Vascular;  Laterality: Left;  . INSERTION OF DIALYSIS  CATHETER N/A 09/06/2014   Procedure: INSERTION OF DIALYSIS CATHETER RIGHT INTERNAL JUGULAR VEIN;  Surgeon: Pryor Ochoa, MD;  Location: [facility] OR;  Service: Vascular;  Laterality: N/A;  . LEFT HEART CATHETERIZATION WITH CORONARY ANGIOGRAM N/A 09/17/2014   Procedure: LEFT HEART CATHETERIZATION WITH CORONARY ANGIOGRAM;  Surgeon: Lennette Bihari, MD;  Location[facility] CATH LAB;  Service: Cardiovascular;  Laterality: N/A;  . LIGATION OF COMPETING BRANCHES OF ARTERIOVENOUS FISTULA Left 09/06/2014   Procedure: LIGATION OF COMPETING BRANCHES OF ARTERIOVENOUS FISTULA;  Surgeon: Pryor Ochoa, MD;  Location: [facility] OR;  Service: Vascular;  Laterality: Left;  Marland Kitchen MULTIPLE TOOTH EXTRACTIONS  09/2013   Remonve remaining 3 teeth, no with dentures      reports that she quit smoking about 15 years ago. Her smoking use included Cigarettes. She has never used smokeless tobacco. She reports that she does not drink alcohol or use drugs.  Allergies  Allergen Reactions  . Heparin Other (See Comments)    Positive Hep Induced Plt Ab and SRA 09/12/2014 and 09/17/2014 (2/9 mis-reported as negative by lab). Pt rec'd SQ heparin and heparin w/ HD prior to + results.    Family History  Problem Relation Age of Onset  . Diabetes Mother      Prior to Admission medications   Medication Sig Start Date End Date Taking? Authorizing Provider  amLODipine (NORVASC) 10 MG tablet Take 10 mg by mouth at bedtime.    Historical Provider, MD  carvedilol (COREG) 12.5 MG tablet One twice daily to strengthen the heart and control BP Patient not taking: Reported on 04/19/2016 02/12/15   [name], MD  docusate sodium (COLACE) 100 MG capsule Take 1 capsule (100 mg total) by mouth daily. To prevent constipation Patient not taking: Reported on 04/19/2016 09/06/15   Laurence Spates, MD  ENSURE (ENSURE) Take 237 mLs by mouth 3 (three) times daily between meals.    Historical Provider, MD  ferrous sulfate 325 (65 FE) MG tablet Take 1 tablet (325 mg  total) by mouth daily. Patient not taking: Reported on 04/19/2016 09/06/15   Laurence Spates, MD  fluocinonide cream (LIDEX) 0.05 % Apply 1 application topically 2 (two) times daily. Apply from neck down twice a day (provided by dermatology).    Historical Provider, MD  hydrALAZINE (APRESOLINE) 25 MG tablet Take 1 tablet (25 mg total) by mouth every 8 (eight) hours. Patient not taking: Reported on 04/19/2016 11/07/14   [name], MD  hydrocortisone 2.5 % lotion Apply 1 application topically daily. Apply to face 09/05/15   Historical Provider, MD  lidocaine-prilocaine (EMLA) cream Apply 1 application topically daily as needed (prior to dialysis).     Historical Provider, MD  multivitamin (RENA-VIT) TABS tablet Take 1 tablet by mouth at bedtime. Patient not taking: Reported on 04/19/2016 03/15/15   Penny Pia, MD  Nutritional Supplements (FEEDING SUPPLEMENT, NEPRO CARB STEADY,) LIQD Take 237 mLs by mouth as needed (missed meal during dialysis.). Patient not taking: Reported on 04/19/2016 03/15/15   Penny Pia, MD  sertraline (ZOLOFT) 50 MG tablet Take 1 tablet (50 mg total) by mouth daily. Patient must schedule and keep appointment  for before any more refills. Patient not taking: Reported on 04/19/2016 01/16/16   Kimber RelicArthur G Green, MD    Physical Exam: Vitals:   04/19/16 1700 04/19/16 1742 04/19/16 1754 04/19/16 1900  BP: (!) 190/101 189/82  170/67  Pulse:  67    Resp: 13 18  16   Temp:   (!) 96.9 F (36.1 C)   TempSrc:   Rectal   SpO2:  100%        Constitutional: NAD, calm, comfortable. Cachectic.  Eyes: PERTLA, lids and conjunctivae normal ENMT: Mucous membranes are moist. Posterior pharynx clear of any exudate or lesions.   Neck: normal, supple, no masses, no thyromegaly Respiratory: clear to auscultation bilaterally. Normal respiratory effort. No accessory muscle use.  Cardiovascular: S1 & S2 heard, regular rate and rhythm, grade 3 holosystolic murmur at upper SB. No extremity edema.  2+ pedal pulses. No carotid bruits. No significant JVD. Abdomen: No distension, no tenderness, no masses palpated. Bowel sounds normal.  Musculoskeletal: no clubbing / cyanosis. No joint deformity upper and lower extremities. Normal muscle tone. AVF in left forearm with thrill Skin: patches of lichenification and scattered excoriations on background of xerosis. Warm, dry, well-perfused. Neurologic: CN 2-12 grossly intact. Sensation intact, DTR normal. Strength 5/5 in all 4 limbs.  Psychiatric: Normal judgment and insight. Alert and oriented to person and place, not year or month. Normal mood and affect.     Labs on Admission: I have personally reviewed following labs and imaging studies  CBC:  Recent Labs Lab 04/19/16 1657 04/19/16 1715  WBC 3.9*  --   NEUTROABS 2.9  --   HGB 12.2 14.3  HCT 36.9 42.0  MCV 96.1  --   PLT 147*  --    Basic Metabolic Panel:  Recent Labs Lab 04/19/16 1657 04/19/16 1715  NA 129* 126*  K 5.9* 5.6*  CL 85* 89*  CO2 17*  --   GLUCOSE 59* 55*  BUN 171* >140*  CREATININE 19.30* >18.00*  CALCIUM 9.3  --    GFR: CrCl cannot be calculated (Unknown ideal weight.). Liver Function Tests:  Recent Labs Lab 04/19/16 1657  AST 25  ALT 26  ALKPHOS 91  BILITOT 1.4*  PROT 7.6  ALBUMIN 3.5   No results for input(s): LIPASE, AMYLASE in the last 168 hours. No results for input(s): AMMONIA in the last 168 hours. Coagulation Profile: No results for input(s): INR, PROTIME in the last 168 hours. Cardiac Enzymes: No results for input(s): CKTOTAL, CKMB, CKMBINDEX, TROPONINI in the last 168 hours. BNP (last 3 results) No results for input(s): PROBNP in the last 8760 hours. HbA1C: No results for input(s): HGBA1C in the last 72 hours. CBG:  Recent Labs Lab 04/19/16 1951  GLUCAP 49*   Lipid Profile: No results for input(s): CHOL, HDL, LDLCALC, TRIG, CHOLHDL, LDLDIRECT in the last 72 hours. Thyroid Function Tests: No results for input(s): TSH,  T4TOTAL, FREET4, T3FREE, THYROIDAB in the last 72 hours. Anemia Panel: No results for input(s): VITAMINB12, FOLATE, FERRITIN, TIBC, IRON, RETICCTPCT in the last 72 hours. Urine analysis:    Component Value Date/Time   COLORURINE YELLOW 03/12/2015 1506   APPEARANCEUR TURBID (A) 03/12/2015 1506   LABSPEC 1.025 03/12/2015 1506   PHURINE 6.5 03/12/2015 1506   GLUCOSEU NEGATIVE 03/12/2015 1506   HGBUR MODERATE (A) 03/12/2015 1506   BILIRUBINUR SMALL (A) 03/12/2015 1506   KETONESUR NEGATIVE 03/12/2015 1506   PROTEINUR >300 (A) 03/12/2015 1506   UROBILINOGEN 0.2 03/12/2015 1506   NITRITE NEGATIVE 03/12/2015  1506   LEUKOCYTESUR LARGE (A) 03/12/2015 1506   Sepsis Labs: @LABRCNTIP (procalcitonin:4,lacticidven:4) )No results found for this or any previous visit (from the past 240 hour(s)).   Radiological Exams on Admission: Dg Chest 2 View  Result Date: 04/19/2016 CLINICAL DATA:  Missed dialysis for the past 2 weeks. Chronic kidney disease. Diabetes. EXAM: CHEST  2 VIEW COMPARISON:  04/16/2015. FINDINGS: Mildly progressive enlargement of the cardiac silhouette. Mildly increased prominence of the pulmonary vasculature and interstitial markings. Small bilateral pleural effusions. Diffuse osteopenia. Left axillary and upper arm stent. IMPRESSION: Mild changes of congestive heart failure. Electronically Signed   By: Beckie Salts M.D.   On: 04/19/2016 17:39   Ct Head Wo Contrast  Result Date: 04/19/2016 CLINICAL DATA:  82 year old diabetic female on dialysis. Weakness. No dialysis for the past 2 weeks. Initial encounter. EXAM: CT HEAD WITHOUT CONTRAST CT CERVICAL SPINE WITHOUT CONTRAST TECHNIQUE: Multidetector CT imaging of the head and cervical spine was performed following the standard protocol without intravenous contrast. Multiplanar CT image reconstructions of the cervical spine were also generated. COMPARISON:  03/12/2015 CT head and cervical spine. FINDINGS: CT HEAD FINDINGS Brain: No  intracranial hemorrhage or CT evidence of large acute infarct. Remote right cerebellar infarct.  Chronic microvascular changes. Global atrophy without hydrocephalus. No intracranial mass lesion noted on this unenhanced exam. Vascular: Vascular calcifications. Skull: No worrisome abnormality. Sinuses/Orbits: Minimal mucosal thickening sphenoid sinus air cells bilaterally. Other: Negative. CT CERVICAL SPINE FINDINGS Alignment: No cervical spine fracture or malalignment. Degenerative changes cervical spine similar to prior exam without destruction to suggest acute infection. Cervical spondylotic changes contribute to various degrees of spinal stenosis/ foraminal narrowing most notable on the left at the C5-6 level. No worrisome neck mass.  Carotid bifurcation calcifications. No worrisome upper lung mass. IMPRESSION: CT HEAD No intracranial hemorrhage or CT evidence of large acute infarct. Remote right cerebellar infarct. Chronic microvascular changes. Global atrophy without hydrocephalus. Minimal mucosal thickening sphenoid sinus air cells bilaterally. CT CERVICAL SPINE No cervical spine fracture or malalignment. Degenerative changes cervical spine similar to prior exam. Electronically Signed   By: Lacy Duverney M.D.   On: 04/19/2016 17:35   Ct Cervical Spine Wo Contrast  Result Date: 04/19/2016 CLINICAL DATA:  82 year old diabetic female on dialysis. Weakness. No dialysis for the past 2 weeks. Initial encounter. EXAM: CT HEAD WITHOUT CONTRAST CT CERVICAL SPINE WITHOUT CONTRAST TECHNIQUE: Multidetector CT imaging of the head and cervical spine was performed following the standard protocol without intravenous contrast. Multiplanar CT image reconstructions of the cervical spine were also generated. COMPARISON:  03/12/2015 CT head and cervical spine. FINDINGS: CT HEAD FINDINGS Brain: No intracranial hemorrhage or CT evidence of large acute infarct. Remote right cerebellar infarct.  Chronic microvascular changes.  Global atrophy without hydrocephalus. No intracranial mass lesion noted on this unenhanced exam. Vascular: Vascular calcifications. Skull: No worrisome abnormality. Sinuses/Orbits: Minimal mucosal thickening sphenoid sinus air cells bilaterally. Other: Negative. CT CERVICAL SPINE FINDINGS Alignment: No cervical spine fracture or malalignment. Degenerative changes cervical spine similar to prior exam without destruction to suggest acute infection. Cervical spondylotic changes contribute to various degrees of spinal stenosis/ foraminal narrowing most notable on the left at the C5-6 level. No worrisome neck mass.  Carotid bifurcation calcifications. No worrisome upper lung mass. IMPRESSION: CT HEAD No intracranial hemorrhage or CT evidence of large acute infarct. Remote right cerebellar infarct. Chronic microvascular changes. Global atrophy without hydrocephalus. Minimal mucosal thickening sphenoid sinus air cells bilaterally. CT CERVICAL SPINE No cervical spine fracture or malalignment. Degenerative  changes cervical spine similar to prior exam. Electronically Signed   By: Lacy Duverney M.D.   On: 04/19/2016 17:35    EKG: Independently reviewed. Sinus rhythm, LVH by voltage criteria with secondary repolarization abnormality   Assessment/Plan  1. ESRD with uremia, hyperkalemia  - Patient has gone 12 days without HD, last session 04/06/16  - Potassium 5.9 on admission without EKG changes, BUN 171, no resp distress, mild CHF on CXR  - Nephrology is consulting and much appreciated; inpatient HD has been ordered  - Monitor on telemetry; repeat serial potassium levels until normalized  2. Hypoglycemia  - Likely d/t renal failure and poor nutrition - Treated with dextrose in ED, continue frequent CBG's  - Encourage oral intake    3. Leukopenia, thrombocytopenia   - WBC 3,900 on admission, previously normal  - Sepsis considered, but lactate reassuring and no potential source identified  - Suspect leukopenia  may be d/t poor nutrition and nutritional deficiency; will check B12 and folate, supplement prn  - Platelet count 147,000 on admission; intermittently low in past; B12 and folate pending  - No s/s of active blood-loss   4. Hypertension  - BP elevated on admission; volume-excess likely contributing  - HD has been ordered by nephrology  - Continue Norvasc and hydralazine    5. Adult failure to thrive - Pt living alone, barely eating per family, and not going to HD  - Pt denies depression, had previously been on Zoloft before she stopped taking  - She reports a desire to continue treatment, including HD, at time of admission  - PT and case manager evaluations requested     DVT prophylaxis: SCD's  Code Status: Full  Family Communication: Discussed with patient Disposition Plan: Admit to stepdown  Consults called: Nephrology  Admission status: Inpatient    Briscoe Deutscher, MD [facility] Pager [phone]  If 7PM-7AM, please contact night-coverage [email or web address] Password [facility]  04/19/2016, 8:04 PM

## 2016-04-19 NOTE — Consult Note (Addendum)
[facility] Consultation Note Requesting Physician:  EDP Primary Nephrologist: Dr. Marval Regal Reason for Consult:  ESRD in need of HD  HPI: The patient is a 82 y.o. year-old woman w/ESRD (TTS [facility]), dementia, HTN. She was brought to the ED and admitted with uremia after missing HD since 04/06/16.  Outpt records from her [place] are reviewed: She last attended HD on 04/06/16 at [facility].  Was called each time a TMT was missed and told staff "too weak to come to HD and would come when she felt like it" and that she "never wanted to go back to that hospital".  SW from the center met with pt.'s cousin, Iona Beard last week, to discuss concerns and he indicated that pt forgetful and intermittently confused, that she does not eat food, only drinks water, sprite, and ensure, has falls at home and does not want anyone to know. He reported that she would no longer allow him in her home to assist ADL's.   HD unit today summoned an ambulance to her home - brought to the ED - labs there showed K 5.9, bicarb 17, BUN 171, creatinine 19.3, hypothermic hypoglycemic.   We are asked to see to provide dialysis. She says she is "weak", without  CP, SOB, cough. Not known if fevers, no chills. No pain, nausea or vomiting. Drinks a lot of Ensure.    Past Medical History:  Diagnosis Date  . Anemia, unspecified   . Cataract   . CHF (congestive heart failure) ([place])   . Chronic kidney disease, stage II (mild)    [facility]  . Dementia    forgetful, .  . Failure to thrive in adult 03/09/2015  . Insomnia, unspecified   . Memory loss 02/10/2015  . Nocturia   . Obesity, unspecified   . Other and unspecified hyperlipidemia   . Other anxiety states   . Other atopic dermatitis and related conditions   . Pain in joint, lower leg   . PVD (peripheral vascular disease) ([place])   . Rash and other nonspecific skin eruption   . Routine general medical examination at a health care facility   . Thrombocytopenia ([place])    . Type II or unspecified type diabetes mellitus with renal manifestations, not stated as uncontrolled   . Unspecified essential hypertension   . Unspecified vitamin D deficiency   . Urinary frequency      Past Surgical History:  Procedure Laterality Date  . ABDOMINAL HYSTERECTOMY  1970  . AV FISTULA PLACEMENT Left 09/06/2014   Procedure: CREATION OF LEFT UPPER ARM ARTERIOVENOUS (AV) FISTULA ;  Surgeon: Mal Misty, MD;  Location: [place];  Service: Vascular;  Laterality: Left;  . BREAST SURGERY  1979   nodule removed  . EYE SURGERY  2004   catarcts removed from right eye.  Marland Kitchen FALSE ANEURYSM REPAIR Left 03/07/2015   Procedure: REPAIR PSEUDOANEURYSMS OF LEFT ARM ARTERIOVENOUS FISTULA;  Surgeon: Angelia Mould, MD;  Location: [place];  Service: Vascular;  Laterality: Left;  . INSERTION OF DIALYSIS CATHETER N/A 09/06/2014   Procedure: INSERTION OF DIALYSIS CATHETER RIGHT INTERNAL JUGULAR VEIN;  Surgeon: Mal Misty, MD;  Location: [place];  Service: Vascular;  Laterality: N/A;  . LEFT HEART CATHETERIZATION WITH CORONARY ANGIOGRAM N/A 09/17/2014   Procedure: LEFT HEART CATHETERIZATION WITH CORONARY ANGIOGRAM;  Surgeon: Troy Sine, MD;  Location: [facility] CATH LAB;  Service: Cardiovascular;  Laterality: N/A;  . LIGATION OF COMPETING BRANCHES OF ARTERIOVENOUS FISTULA Left 09/06/2014  Procedure: LIGATION OF COMPETING BRANCHES OF ARTERIOVENOUS FISTULA;  Surgeon: Mal Misty, MD;  Location: [place];  Service: Vascular;  Laterality: Left;  Marland Kitchen MULTIPLE TOOTH EXTRACTIONS  09/2013   Remonve remaining 3 teeth, no with dentures      Family History  Problem Relation Age of Onset  . Diabetes Mother    Social History:  reports that she quit smoking about 15 years ago. Her smoking use included Cigarettes. She has never used smokeless tobacco. She reports that she does not drink alcohol or use drugs.   Allergies  Allergen Reactions  . Heparin Other (See Comments)    Positive Hep Induced Plt Ab and  SRA 09/12/2014 and 09/17/2014 (2/9 mis-reported as negative by lab). Pt rec'd SQ heparin and heparin w/ HD prior to + results.    Home medications: Prior to Admission medications   Medication Sig Start Date End Date Taking? Authorizing Provider  amLODipine (NORVASC) 10 MG tablet Take 10 mg by mouth at bedtime.    Historical Provider, MD  carvedilol (COREG) 12.5 MG tablet One twice daily to strengthen the heart and control BP Patient not taking: Reported on 04/19/2016 02/12/15   Estill Dooms, MD  docusate sodium (COLACE) 100 MG capsule Take 1 capsule (100 mg total) by mouth daily. To prevent constipation Patient not taking: Reported on 04/19/2016 09/06/15   Sharlett Iles, MD  ENSURE (ENSURE) Take 237 mLs by mouth 3 (three) times daily between meals.    Historical Provider, MD  ferrous sulfate 325 (65 FE) MG tablet Take 1 tablet (325 mg total) by mouth daily. Patient not taking: Reported on 04/19/2016 09/06/15   Sharlett Iles, MD  fluocinonide cream (LIDEX) 4.09 % Apply 1 application topically 2 (two) times daily. Apply from neck down twice a day (provided by dermatology).    Historical Provider, MD  hydrALAZINE (APRESOLINE) 25 MG tablet Take 1 tablet (25 mg total) by mouth every 8 (eight) hours. Patient not taking: Reported on 04/19/2016 11/07/14   Estill Dooms, MD  hydrocortisone 2.5 % lotion Apply 1 application topically daily. Apply to face 09/05/15   Historical Provider, MD  lidocaine-prilocaine (EMLA) cream Apply 1 application topically daily as needed (prior to dialysis).     Historical Provider, MD  multivitamin (RENA-VIT) TABS tablet Take 1 tablet by mouth at bedtime. Patient not taking: Reported on 04/19/2016 03/15/15   Velvet Bathe, MD  Nutritional Supplements (FEEDING SUPPLEMENT, NEPRO CARB STEADY,) LIQD Take 237 mLs by mouth as needed (missed meal during dialysis.). Patient not taking: Reported on 04/19/2016 03/15/15   Velvet Bathe, MD  sertraline (ZOLOFT) 50 MG tablet Take 1 tablet  (50 mg total) by mouth daily. Patient must schedule and keep appointment for before any more refills. Patient not taking: Reported on 04/19/2016 01/16/16   Estill Dooms, MD    Inpatient medications: . amLODipine  10 mg Oral QHS  . hydrALAZINE  25 mg Oral Q8H  . sodium chloride flush  3 mL Intravenous Q12H  . sodium chloride flush  3 mL Intravenous Q12H    Review of Systems See HPI    Physical Exam:  Blood pressure (!) 189/79, pulse 70, temperature (!) 96.9 F (36.1 C), temperature source Rectal, resp. rate 18, height 5' 3"  (1.6 m), weight 43.5 kg (95 lb 14.4 oz), SpO2 99 %.  Cachectic elderly female NAD Sclerae not icteric No JVD or bruits in the neck Lungs grossly clear Regular cardiac rhythm S1S2 No S3 2/6 murmur upper sternal  border No diastolic murmur or pericardial friction rub 2+ LE edema Left arm AVF + bruit and thrill Awake, alert, oriented to person and place Is not aware of year or month or date  Labs:   Recent Labs Lab 04/19/16 1657 04/19/16 1715  NA 129* 126*  K 5.9* 5.6*  CL 85* 89*  CO2 17*  --   GLUCOSE 59* 55*  BUN 171* >140*  CREATININE 19.30* >18.00*  CALCIUM 9.3  --      Recent Labs Lab 04/19/16 1657  AST 25  ALT 26  ALKPHOS 91  BILITOT 1.4*  PROT 7.6  ALBUMIN 3.5     Recent Labs Lab 04/19/16 1657 04/19/16 1715  WBC 3.9*  --   NEUTROABS 2.9  --   HGB 12.2 14.3  HCT 36.9 42.0  MCV 96.1  --   PLT 147*  --     Recent Labs Lab 04/19/16 1951 04/19/16 2033  GLUCAP 49* 156*    Dg Chest 2 View  Result Date: 04/19/2016 CLINICAL DATA:  Missed dialysis for the past 2 weeks. Chronic kidney disease. Diabetes. EXAM: CHEST  2 VIEW COMPARISON:  04/16/2015. FINDINGS: Mildly progressive enlargement of the cardiac silhouette. Mildly increased prominence of the pulmonary vasculature and interstitial markings. Small bilateral pleural effusions. Diffuse osteopenia. Left axillary and upper arm stent. IMPRESSION: Mild changes of congestive  heart failure. Electronically Signed   By: Claudie Revering M.D.   On: 04/19/2016 17:39   Ct Head Wo Contrast  Result Date: 04/19/2016 CLINICAL DATA:  82 year old diabetic female on dialysis. Weakness. No dialysis for the past 2 weeks. Initial encounter. EXAM: CT HEAD WITHOUT CONTRAST CT CERVICAL SPINE WITHOUT CONTRAST TECHNIQUE: Multidetector CT imaging of the head and cervical spine was performed following the standard protocol without intravenous contrast. Multiplanar CT image reconstructions of the cervical spine were also generated. COMPARISON:  03/12/2015 CT head and cervical spine. FINDINGS: CT HEAD FINDINGS Brain: No intracranial hemorrhage or CT evidence of large acute infarct. Remote right cerebellar infarct.  Chronic microvascular changes. Global atrophy without hydrocephalus. No intracranial mass lesion noted on this unenhanced exam. Vascular: Vascular calcifications. Skull: No worrisome abnormality. Sinuses/Orbits: Minimal mucosal thickening sphenoid sinus air cells bilaterally. Other: Negative. CT CERVICAL SPINE FINDINGS Alignment: No cervical spine fracture or malalignment. Degenerative changes cervical spine similar to prior exam without destruction to suggest acute infection. Cervical spondylotic changes contribute to various degrees of spinal stenosis/ foraminal narrowing most notable on the left at the C5-6 level. No worrisome neck mass.  Carotid bifurcation calcifications. No worrisome upper lung mass. IMPRESSION: CT HEAD No intracranial hemorrhage or CT evidence of large acute infarct. Remote right cerebellar infarct. Chronic microvascular changes. Global atrophy without hydrocephalus. Minimal mucosal thickening sphenoid sinus air cells bilaterally. CT CERVICAL SPINE No cervical spine fracture or malalignment. Degenerative changes cervical spine similar to prior exam. Electronically Signed   By: Genia Del M.D.   On: 04/19/2016 17:35   Ct Cervical Spine Wo Contrast  Result Date:  04/19/2016 CLINICAL DATA:  82 year old diabetic female on dialysis. Weakness. No dialysis for the past 2 weeks. Initial encounter. EXAM: CT HEAD WITHOUT CONTRAST CT CERVICAL SPINE WITHOUT CONTRAST TECHNIQUE: Multidetector CT imaging of the head and cervical spine was performed following the standard protocol without intravenous contrast. Multiplanar CT image reconstructions of the cervical spine were also generated. COMPARISON:  03/12/2015 CT head and cervical spine. FINDINGS: CT HEAD FINDINGS Brain: No intracranial hemorrhage or CT evidence of large acute infarct. Remote right cerebellar infarct.  Chronic microvascular changes. Global atrophy without hydrocephalus. No intracranial mass lesion noted on this unenhanced exam. Vascular: Vascular calcifications. Skull: No worrisome abnormality. Sinuses/Orbits: Minimal mucosal thickening sphenoid sinus air cells bilaterally. Other: Negative. CT CERVICAL SPINE FINDINGS Alignment: No cervical spine fracture or malalignment. Degenerative changes cervical spine similar to prior exam without destruction to suggest acute infection. Cervical spondylotic changes contribute to various degrees of spinal stenosis/ foraminal narrowing most notable on the left at the C5-6 level. No worrisome neck mass.  Carotid bifurcation calcifications. No worrisome upper lung mass. IMPRESSION: CT HEAD No intracranial hemorrhage or CT evidence of large acute infarct. Remote right cerebellar infarct. Chronic microvascular changes. Global atrophy without hydrocephalus. Minimal mucosal thickening sphenoid sinus air cells bilaterally. CT CERVICAL SPINE No cervical spine fracture or malalignment. Degenerative changes cervical spine similar to prior exam. Electronically Signed   By: Genia Del M.D.   On: 04/19/2016 17:35   HD Prescription TTS [facility] 4 hours 300/A1.5 EDW 42 kg 2K 2.25 Ca bath Mircera 200 mcg Q2weeks (last dosed 8/29)   Background:  Assessment/Recommendations  ESRD   No HD since 04/06/16.  She will require HD tonight but because so azotemic would only be able to get a short, slow treatment tonight (to avoid disequilibrium).   Plan a 2.5 hour treatment at BFR/DFR 250/500, remove 1 liter.  HD again next 2 days, serially get back up to usual time/BFR  Anemia Last dosed w/Mircera 8/29 Hb at this time is fine  HTN Unclear what meds she has been taking at home if any Primary team has ordered hydralazine and amlodipine Volume removal will help  Hypoglycemia Poor nutrition According to family member does not eat food, just ingests liquids Was here 03/2015 and at that time EDW 50.5 kg and current EDW 42 kg so has lost sig amount of weight in the past year.  FTT I don't have much history with pt - but I did meet her during an admission 03/2015 and she has clearly declined since then (it was felt at that time that she would benefit from ALF vs SNF and she was not interested) Has been shunning family who have tried to check on her ? If depression vs dementia (plus uremia clouding the issue right now) CT brain no acute stroke, microvascular changes Says wants to stay on HD Should not be living alone - will require SNF this time   Jamal Maes,  MD [place] pager 04/19/2016, 8:53 PM

## 2016-04-19 NOTE — Progress Notes (Signed)
Pt arrived to 2C08 around 2115.  Pt is alert and oriented to self and place.  Pts temperature is 96.3 rectal, baer hugger has been ordered.  Pt is hypertensive but will be going to dialysis shortly so antihypertensives will be held for now.  Blood sugar is 156 - post one amp of D50 and a dextrose infusion. RN will continue to monitor.

## 2016-04-20 LAB — CBC
HEMATOCRIT: 35 % — AB (ref 36.0–46.0)
Hemoglobin: 11.5 g/dL — ABNORMAL LOW (ref 12.0–15.0)
MCH: 31.4 pg (ref 26.0–34.0)
MCHC: 32.9 g/dL (ref 30.0–36.0)
MCV: 95.6 fL (ref 78.0–100.0)
Platelets: 126 10*3/uL — ABNORMAL LOW (ref 150–400)
RBC: 3.66 MIL/uL — ABNORMAL LOW (ref 3.87–5.11)
RDW: 16 % — AB (ref 11.5–15.5)
WBC: 3.9 10*3/uL — ABNORMAL LOW (ref 4.0–10.5)

## 2016-04-20 LAB — GLUCOSE, CAPILLARY
GLUCOSE-CAPILLARY: 63 mg/dL — AB (ref 65–99)
Glucose-Capillary: 79 mg/dL (ref 65–99)
Glucose-Capillary: 110 mg/dL — ABNORMAL HIGH (ref 65–99)
Glucose-Capillary: 85 mg/dL (ref 65–99)

## 2016-04-20 LAB — RENAL FUNCTION PANEL
Albumin: 3 g/dL — ABNORMAL LOW (ref 3.5–5.0)
Anion gap: 20 — ABNORMAL HIGH (ref 5–15)
BUN: 88 mg/dL — AB (ref 6–20)
CHLORIDE: 91 mmol/L — AB (ref 101–111)
CO2: 21 mmol/L — ABNORMAL LOW (ref 22–32)
Calcium: 9.1 mg/dL (ref 8.9–10.3)
Creatinine, Ser: 12.57 mg/dL — ABNORMAL HIGH (ref 0.44–1.00)
GFR calc Af Amer: 3 mL/min — ABNORMAL LOW (ref >60)
GFR calc non Af Amer: 2 mL/min — ABNORMAL LOW (ref >60)
GLUCOSE: 104 mg/dL — AB (ref 65–99)
POTASSIUM: 3.8 mmol/L (ref 3.5–5.1)
Phosphorus: 7.1 mg/dL — ABNORMAL HIGH (ref 2.5–4.6)
Sodium: 132 mmol/L — ABNORMAL LOW (ref 135–145)

## 2016-04-20 LAB — TSH: TSH: 5.363 u[IU]/mL — ABNORMAL HIGH (ref 0.350–4.500)

## 2016-04-20 LAB — MRSA PCR SCREENING: MRSA by PCR: NEGATIVE

## 2016-04-20 LAB — POTASSIUM
Potassium: 4 mmol/L (ref 3.5–5.1)
Potassium: 4.1 mmol/L (ref 3.5–5.1)
Potassium: 3.4 mmol/L — ABNORMAL LOW (ref 3.5–5.1)
Potassium: 3.2 mmol/L — ABNORMAL LOW (ref 3.5–5.1)

## 2016-04-20 LAB — VITAMIN B12: VITAMIN B 12: 2168 pg/mL — AB (ref 180–914)

## 2016-04-20 MED ORDER — DEXTROSE 10 % IV SOLN
INTRAVENOUS | Status: DC
  Administered 2016-04-20: 1000 mL via INTRAVENOUS

## 2016-04-20 MED ORDER — DEXTROSE 50 % IV SOLN
INTRAVENOUS | Status: AC
  Administered 2016-04-20: 25 mL
  Filled 2016-04-20: qty 50

## 2016-04-20 MED ORDER — NEPRO/CARBSTEADY PO LIQD
237.0000 mL | Freq: Two times a day (BID) | ORAL | Status: DC
  Administered 2016-04-20: 237 mL via ORAL

## 2016-04-20 NOTE — Progress Notes (Signed)
  Hypoglycemic Event  CBG: 63  Treatment: 1/2 amp of dextrose  Symptoms: lethargy/fatigue/confusion  Follow-up CBG: 110   Time: 0400  Possible Reasons for Event: poor appetite, lack of intake?  Comments/MD notified: K. Schorr paged, D10 started at 20.   Natalie Wolfe, Natalie Wolfe

## 2016-04-20 NOTE — Evaluation (Signed)
Physical Therapy Evaluation Patient Details Name: Natalie Wolfe MRN: [id number] DOB: [birth year] Today's Date: 04/20/2016   History of Present Illness  82 y.o. female with medical history significant for dementia, end-stage renal disease on hemodialysis, and hypertension who presents the emergency department with EMS after missing her dialysis for close to 2 weeks. Patient lives alone and has a cousin who cares for her, but reports that the patient often does not let him into the building. She is reportedly eaten very little in recent weeks, there is suspicion that she has not taken her medications, and she has not been to dialysis since 04/06/2016.  Clinical Impression  Pt admitted with above diagnosis. Was not managing at home alone (falls, not going to dialysis). Pt currently with functional limitations due to the deficits listed below (see PT Problem List).  Pt will benefit from skilled PT to increase their independence and safety with mobility to allow discharge to the venue listed below.       Follow Up Recommendations SNF;Supervision/Assistance - 24 hour    Equipment Recommendations  Other (comment) (TBD next venue)    Recommendations for Other Services OT consult     Precautions / Restrictions Precautions Precautions: Fall      Mobility  Bed Mobility Overal bed mobility: Needs Assistance Bed Mobility: Supine to Sit     Supine to sit: Min guard     General bed mobility comments: with heavy use of rail; guarding for nearly needing assist and for lines  Transfers Overall transfer level: Needs assistance Equipment used: None;Rolling walker (2 wheeled) Transfers: Sit to/from Stand Sit to Stand: Mod assist         General transfer comment: for anterior and upward translation; slight posterior bias  Ambulation/Gait Ambulation/Gait assistance: Min assist Ambulation Distance (Feet): 12 Feet (to toilet; 10) Assistive device: Rolling walker (2 wheeled);1 person  hand held assist Gait Pattern/deviations: Step-through pattern;Decreased stride length;Drifts right/left;Scissoring;Narrow base of support   Gait velocity interpretation: Below normal speed for age/gender General Gait Details: pt unsteady and attempted to hold IV pole and HHA as attempting to get to bathroom, pt unable to guide IV pole and used RW coming out of bathroom  Stairs            Wheelchair Mobility    Modified Rankin (Stroke Patients Only)       Balance Overall balance assessment: Needs assistance Sitting-balance support: No upper extremity supported;Feet supported Sitting balance-Leahy Scale: Fair     Standing balance support: Bilateral upper extremity supported Standing balance-Leahy Scale: Poor                               Pertinent Vitals/Pain VSS per monitors  Pain Assessment: Faces Faces Pain Scale: Hurts even more Pain Location: Rt knee (chronic) Pain Descriptors / Indicators: Grimacing Pain Intervention(s): Limited activity within patient's tolerance;Monitored during session (educated on use of RW)    Home Living Family/patient expects to be discharged to:: Skilled nursing facility Living Arrangements: Alone                    Prior Function Level of Independence: Independent         Comments: poorly managing     Hand Dominance        Extremity/Trunk Assessment   Upper Extremity Assessment: Generalized weakness           Lower Extremity Assessment: Generalized weakness      Cervical /  Trunk Assessment: Normal  Communication   Communication: No difficulties  Cognition Arousal/Alertness: Awake/alert Behavior During Therapy: Flat affect Overall Cognitive Status: No family/caregiver present to determine baseline cognitive functioning Area of Impairment: Orientation;Attention;Safety/judgement;Awareness;Problem solving Orientation Level: Place;Time;Situation Current Attention Level: Sustained Memory:  Decreased short-term memory   Safety/Judgement: Decreased awareness of safety;Decreased awareness of deficits Awareness:  (pre-intellectual') Problem Solving: Slow processing;Requires verbal cues;Requires tactile cues General Comments: "[facility] [place]"; unaware she had small BM while in bed    General Comments      Exercises        Assessment/Plan    PT Assessment Patient needs continued PT services  PT Diagnosis Generalized weakness;Difficulty walking   PT Problem List Decreased strength;Decreased activity tolerance;Decreased balance;Decreased mobility;Decreased cognition;Decreased knowledge of use of DME;Decreased safety awareness;Decreased knowledge of precautions;Pain  PT Treatment Interventions DME instruction;Gait training;Functional mobility training;Therapeutic activities;Therapeutic exercise;Balance training;Cognitive remediation;Patient/family education   PT Goals (Current goals can be found in the Care Plan section) Acute Rehab PT Goals Patient Stated Goal: go home alone PT Goal Formulation: With patient Time For Goal Achievement: 04/27/16 Potential to Achieve Goals: Fair    Frequency Min 2X/week   Barriers to discharge Decreased caregiver support      Co-evaluation               End of Session Equipment Utilized During Treatment: Gait belt Activity Tolerance: Patient limited by fatigue Patient left: in chair;with call bell/phone within reach;with chair alarm set Nurse Communication: Mobility status;Other (comment) (up in chair; incontinent of BM)         Time: 1610-9604 PT Time Calculation (min) (ACUTE ONLY): 33 min   Charges:   PT Evaluation $PT Eval Moderate Complexity: 1 Procedure PT Treatments $Gait Training: 8-22 mins   PT G Codes:        Natalie Wolfe 05/09/16, 3:28 PM Pager [phone]

## 2016-04-20 NOTE — Progress Notes (Signed)
PT Cancellation Note  Patient Details Name: Natalie Wolfe MRN: [id number] DOB: March 19, 1930   Cancelled Treatment:    Reason Eval/Treat Not Completed: Patient at procedure or test/unavailable. (hemodialysis)   Annalissa Murphey 04/20/2016, 12:53 PM  Pager [phone]

## 2016-04-20 NOTE — Progress Notes (Signed)
[facility] PROGRESS NOTE  Natalie Wolfe QMV:[id number] DOB: 03-22-1930 DOA: 04/19/2016 PCP: Murray Hodgkins, MD  Interim summary and HPI 82 y.o. female with medical history significant for dementia, end-stage renal disease on hemodialysis, and hypertension who presents the emergency department with EMS after missing her dialysis for close to 2 weeks. Patient lives alone and has a cousin who cares for her, but reports that the patient often does not let him into the building. She is reportedly eaten very little in recent weeks, there is suspicion that she has not taken her medications, and she has not been to dialysis since 04/06/2016. The dialysis center is reportedly been calling the patient encouraging her to come in, but she reports being too weak. EMS was activated today in order to get the patient to the emergency department for dialysis. She denies any complaints. Specifically, she denies chest pain, palpitations, cough, or dyspnea. Patient denies any abdominal pain, nausea, vomiting, or diarrhea. She denies depression or anxiety and denies suicidal ideations or hallucinations.  Assessment/Plan: 1. ESRD with uremia and hyperkalemia  - Patient has gone 12 days without HD, last session 04/06/16  - Potassium 5.9 on admission without EKG changes, BUN 171, no resp distress, mild vascular congestion appreciated on CXR  - Nephrology is on board and looking to perform three days of HD consecutively to be back on schedule and to repair volume/electrolytes impairment slowly, in order to avoid strong disequilibrium   - Monitor on telemetry  2. Hypoglycemia  - Likely d/t renal failure and poor nutrition - Treated with dextrose in ED, continue frequent CBG's  - Encourage oral intake  -will add feeding supplements    3. Leukopenia, thrombocytopenia: unclear etiology - WBC 3,900 on admission, previously normal  - Sepsis considered, but lactate reassuring and no potential source identified  -  Suspect leukopenia may be d/t poor nutrition and uremia - will check B12 and TSH - Platelet count 147,000 on admission; intermittently low in past; B12 and folate pending  - No s/s of active blood-loss   4. Hypertension  - BP elevated on admission; volume-excess likely contributing  - HD ordered and dictated by nephrology  - Continue Norvasc and hydralazine   - will adjust medications as needed   5. Adult failure to thrive - Pt living alone, barely eating per family, with intermittent falls and missed HD for 12 days straight.  - Pt denies depression, had previously been on Zoloft before, but she stopped taking it - She reports a desire to continue HD treatment and is in agreement with rehab if needed  -will follow evaluation from PT and have palliative care to evaluate her for GOC and directives management.   -will use nepro BID and ask nutritional service to evaluate    Code Status: Full Family Communication: no family at bedside  Disposition Plan: to be determine. Patient by benefit of SNF for rehabilitation and if improvement down the road, then maybe permanent ALF. Will have PT/OT evaluation. Also will ask palliative care to see and discussed GOC and advance directives    Consultants:  Renal service  Palliative Care   Procedures:  See below for x-ray reports   Inpatient HD  Antibiotics:  None   HPI/Subjective: Afebrile, no CP, no SOB. Patient is calmed and Having HD w/o problem. Oriented X2, alert and in no distress.  Objective: Vitals:   04/20/16 1147 04/20/16 1200  BP: (!) 152/52 (!) 150/56  Pulse: 68   Resp: (!) 21   Temp:  97.6 F (36.4 C) 97.5 F (36.4 C)    Intake/Output Summary (Last 24 hours) at 04/20/16 1327 Last data filed at 04/20/16 1147  Gross per 24 hour  Intake              240 ml  Output             2000 ml  Net            -1760 ml   Filed Weights   04/20/16 0324 04/20/16 0830 04/20/16 1147  Weight: 43.8 kg (96 lb 9 oz) 41.8 kg (92  lb 2.4 oz) 40.8 kg (89 lb 15.2 oz)    Exam:   General:  Afebrile, no CP, oriented X2 (person and place),  Underweight, frail in no acute distress. Patient is having HD.  Cardiovascular: S1 and S2, no rubs or gallops  Respiratory:good air movement, no wheezing   Abdomen: soft, NT,ND, positive BS  Musculoskeletal: no edema, no cyanosis   Data Reviewed: Basic Metabolic Panel:  Recent Labs Lab 04/19/16 1657 04/19/16 1715 04/19/16 2044 04/20/16 0204 04/20/16 0428 04/20/16 0743  NA 129* 126*  --   --  132*  --   K 5.9* 5.6* 5.3* 4.0 3.8 4.1  CL 85* 89*  --   --  91*  --   CO2 17*  --   --   --  21*  --   GLUCOSE 59* 55*  --   --  104*  --   BUN 171* >140*  --   --  88*  --   CREATININE 19.30* >18.00*  --   --  12.57*  --   CALCIUM 9.3  --   --   --  9.1  --   PHOS  --   --   --   --  7.1*  --    Liver Function Tests:  Recent Labs Lab 04/19/16 1657 04/20/16 0428  AST 25  --   ALT 26  --   ALKPHOS 91  --   BILITOT 1.4*  --   PROT 7.6  --   ALBUMIN 3.5 3.0*   CBC:  Recent Labs Lab 04/19/16 1657 04/19/16 1715 04/20/16 0427  WBC 3.9*  --  3.9*  NEUTROABS 2.9  --   --   HGB 12.2 14.3 11.5*  HCT 36.9 42.0 35.0*  MCV 96.1  --  95.6  PLT 147*  --  126*   CBG:  Recent Labs Lab 04/19/16 2033 04/20/16 0109 04/20/16 0257 04/20/16 0412 04/20/16 0804  GLUCAP 156* 79 63* 110* 85    Recent Results (from the past 240 hour(s))  MRSA PCR Screening     Status: None   Collection Time: 04/19/16  8:33 PM  Result Value Ref Range Status   MRSA by PCR NEGATIVE NEGATIVE Final    Comment:        The GeneXpert MRSA Assay (FDA approved for NASAL specimens only), is one component of a comprehensive MRSA colonization surveillance program. It is not intended to diagnose MRSA infection nor to guide or monitor treatment for MRSA infections.      Studies: Dg Chest 2 View  Result Date: 04/19/2016 CLINICAL DATA:  Missed dialysis for the past 2 weeks. Chronic kidney  disease. Diabetes. EXAM: CHEST  2 VIEW COMPARISON:  04/16/2015. FINDINGS: Mildly progressive enlargement of the cardiac silhouette. Mildly increased prominence of the pulmonary vasculature and interstitial markings. Small bilateral pleural effusions. Diffuse osteopenia. Left axillary and upper arm stent. IMPRESSION: Mild  changes of congestive heart failure. Electronically Signed   By: Beckie Salts M.D.   On: 04/19/2016 17:39   Ct Head Wo Contrast  Result Date: 04/19/2016 CLINICAL DATA:  82 year old diabetic female on dialysis. Weakness. No dialysis for the past 2 weeks. Initial encounter. EXAM: CT HEAD WITHOUT CONTRAST CT CERVICAL SPINE WITHOUT CONTRAST TECHNIQUE: Multidetector CT imaging of the head and cervical spine was performed following the standard protocol without intravenous contrast. Multiplanar CT image reconstructions of the cervical spine were also generated. COMPARISON:  03/12/2015 CT head and cervical spine. FINDINGS: CT HEAD FINDINGS Brain: No intracranial hemorrhage or CT evidence of large acute infarct. Remote right cerebellar infarct.  Chronic microvascular changes. Global atrophy without hydrocephalus. No intracranial mass lesion noted on this unenhanced exam. Vascular: Vascular calcifications. Skull: No worrisome abnormality. Sinuses/Orbits: Minimal mucosal thickening sphenoid sinus air cells bilaterally. Other: Negative. CT CERVICAL SPINE FINDINGS Alignment: No cervical spine fracture or malalignment. Degenerative changes cervical spine similar to prior exam without destruction to suggest acute infection. Cervical spondylotic changes contribute to various degrees of spinal stenosis/ foraminal narrowing most notable on the left at the C5-6 level. No worrisome neck mass.  Carotid bifurcation calcifications. No worrisome upper lung mass. IMPRESSION: CT HEAD No intracranial hemorrhage or CT evidence of large acute infarct. Remote right cerebellar infarct. Chronic microvascular changes. Global  atrophy without hydrocephalus. Minimal mucosal thickening sphenoid sinus air cells bilaterally. CT CERVICAL SPINE No cervical spine fracture or malalignment. Degenerative changes cervical spine similar to prior exam. Electronically Signed   By: Lacy Duverney M.D.   On: 04/19/2016 17:35   Ct Cervical Spine Wo Contrast  Result Date: 04/19/2016 CLINICAL DATA:  82 year old diabetic female on dialysis. Weakness. No dialysis for the past 2 weeks. Initial encounter. EXAM: CT HEAD WITHOUT CONTRAST CT CERVICAL SPINE WITHOUT CONTRAST TECHNIQUE: Multidetector CT imaging of the head and cervical spine was performed following the standard protocol without intravenous contrast. Multiplanar CT image reconstructions of the cervical spine were also generated. COMPARISON:  03/12/2015 CT head and cervical spine. FINDINGS: CT HEAD FINDINGS Brain: No intracranial hemorrhage or CT evidence of large acute infarct. Remote right cerebellar infarct.  Chronic microvascular changes. Global atrophy without hydrocephalus. No intracranial mass lesion noted on this unenhanced exam. Vascular: Vascular calcifications. Skull: No worrisome abnormality. Sinuses/Orbits: Minimal mucosal thickening sphenoid sinus air cells bilaterally. Other: Negative. CT CERVICAL SPINE FINDINGS Alignment: No cervical spine fracture or malalignment. Degenerative changes cervical spine similar to prior exam without destruction to suggest acute infection. Cervical spondylotic changes contribute to various degrees of spinal stenosis/ foraminal narrowing most notable on the left at the C5-6 level. No worrisome neck mass.  Carotid bifurcation calcifications. No worrisome upper lung mass. IMPRESSION: CT HEAD No intracranial hemorrhage or CT evidence of large acute infarct. Remote right cerebellar infarct. Chronic microvascular changes. Global atrophy without hydrocephalus. Minimal mucosal thickening sphenoid sinus air cells bilaterally. CT CERVICAL SPINE No cervical spine  fracture or malalignment. Degenerative changes cervical spine similar to prior exam. Electronically Signed   By: Lacy Duverney M.D.   On: 04/19/2016 17:35    Scheduled Meds: . amLODipine  10 mg Oral QHS  . hydrALAZINE  25 mg Oral Q8H  . sodium chloride flush  3 mL Intravenous Q12H  . sodium chloride flush  3 mL Intravenous Q12H   Continuous Infusions: . dextrose 1,000 mL (04/20/16 0316)    Principal Problem:   Uremia Active Problems:   Essential hypertension   Thrombocytopenia (HCC)   Hyperkalemia   Failure  to thrive in adult   Hypoglycemia   ESRD needing dialysis (HCC)   Leukopenia   Hypothermia   Uremia of renal origin   Dehydration    Time spent: 30 minutes    Vassie Loll  [facility] Pager [phone]. If 7PM-7AM, please contact night-coverage at [email or web address], password [facility] 04/20/2016, 1:27 PM  LOS: 1 day

## 2016-04-20 NOTE — Procedures (Signed)
Patient was seen on dialysis and the procedure was supervised.  BFR 300  Via AVF BP is  174/69.   Patient appears to be tolerating treatment well  Tammela Bales A 04/20/2016

## 2016-04-20 NOTE — NC FL2 (Signed)
[place] MEDICAID FL2 LEVEL OF CARE SCREENING TOOL     IDENTIFICATION  Patient Name: Natalie Wolfe Birthdate: Nov 28, 1929 Sex: female Admission Date (Current Location): 04/19/2016  [facility] [place]   Facility and Address:  [facility], [address], [place], [place] [address]      Provider Number: [id number]  Attending Physician Name and Address:  Vassie Lollarlos Madera, MD  Relative Name and Phone Number:  Greggory StallionGeorge, [phone]    Current Level of Care: Hospital Recommended Level of Care: Skilled Nursing Facility Prior Approval Number:    Date Approved/Denied:   PASRR Number: [id number]  Discharge Plan: SNF    Current Diagnoses: Patient Active Problem List   Diagnosis Date Noted  . ESRD needing dialysis (HCC) 04/19/2016  . Leukopenia 04/19/2016  . Uremia 04/19/2016  . Hypothermia 04/19/2016  . Uremia of renal origin 04/19/2016  . Dehydration   . Hypoglycemia 03/12/2015  . Failure to thrive in adult 03/09/2015  . Memory loss 02/10/2015  . Depression 02/05/2015  . Hyperkalemia 01/31/2015  . Pulmonary edema 12/23/2014  . Protein-calorie malnutrition, severe (HCC) 12/23/2014  . Anorexia 10/23/2014  . Normal coronary arteries 09/16/2014 09/17/2014  . Congestive dilated cardiomyopathy-EF 40-45% at cath, 35% by echo   . Troponin level elevated-0.2 09/13/2014  . Syncope 09/12/2014  . ESRD (end stage renal disease) (HCC) 09/12/2014  . Thrombocytopenia (HCC) 09/12/2014  . Cardiopulmonary arrest (HCC) 09/12/2014  . SOB (shortness of breath)   . Overactive bladder 01/14/2013  . Unspecified vitamin D deficiency 01/14/2013  . Anemia of chronic disease 01/14/2013  . Arthritis of knee 01/14/2013  . Essential hypertension 11/27/2012  . Hyperlipemia 11/27/2012    Orientation RESPIRATION BLADDER Height & Weight     Self, Place  Normal Continent Weight: 40.8 kg (89 lb 15.2 oz) Height:  5\' 3"  (160 cm)  BEHAVIORAL SYMPTOMS/MOOD  NEUROLOGICAL BOWEL NUTRITION STATUS      Continent Diet (Please see DC Summary)  AMBULATORY STATUS COMMUNICATION OF NEEDS Skin   Extensive Assist Verbally Normal                       Personal Care Assistance Level of Assistance  Bathing, Feeding, Dressing Bathing Assistance: Maximum assistance Feeding assistance: Independent Dressing Assistance: Limited assistance     Functional Limitations Info             SPECIAL CARE FACTORS FREQUENCY  PT (By licensed PT)     PT Frequency: 5x/week              Contractures      Additional Factors Info  Code Status, Allergies Code Status Info: Full Allergies Info: Heparin           Current Medications (04/20/2016):  This is the current hospital active medication list Current Facility-Administered Medications  Medication Dose Route Frequency Provider Last Rate Last Dose  . 0.9 %  sodium chloride infusion  250 mL Intravenous PRN Briscoe Deutscherimothy S Opyd, MD      . acetaminophen (TYLENOL) tablet 650 mg  650 mg Oral Q6H PRN Briscoe Deutscherimothy S Opyd, MD       Or  . acetaminophen (TYLENOL) suppository 650 mg  650 mg Rectal Q6H PRN Briscoe Deutscherimothy S Opyd, MD      . amLODipine (NORVASC) tablet 10 mg  10 mg Oral QHS Briscoe Deutscherimothy S Opyd, MD   10 mg at 04/20/16 0111  . dextrose 10 % infusion   Intravenous Continuous Leanne ChangKatherine P Schorr, NP  20 mL/hr at 04/20/16 0316 1,000 mL at 04/20/16 0316  . feeding supplement (NEPRO CARB STEADY) liquid 237 mL  237 mL Oral BID BM Vassie Loll, MD   237 mL at 04/20/16 1421  . hydrALAZINE (APRESOLINE) tablet 25 mg  25 mg Oral Q8H Lavone Neri Opyd, MD   25 mg at 04/20/16 1421  . ondansetron (ZOFRAN) tablet 4 mg  4 mg Oral Q6H PRN Briscoe Deutscher, MD       Or  . ondansetron (ZOFRAN) injection 4 mg  4 mg Intravenous Q6H PRN Lavone Neri Opyd, MD      . polyethylene glycol (MIRALAX / GLYCOLAX) packet 17 g  17 g Oral Daily PRN Lavone Neri Opyd, MD      . sodium chloride flush (NS) 0.9 % injection 3 mL  3 mL Intravenous Q12H Briscoe Deutscher, MD    3 mL at 04/20/16 0113  . sodium chloride flush (NS) 0.9 % injection 3 mL  3 mL Intravenous Q12H Timothy S Opyd, MD      . sodium chloride flush (NS) 0.9 % injection 3 mL  3 mL Intravenous PRN Briscoe Deutscher, MD         Discharge Medications: Please see discharge summary for a list of discharge medications.  Relevant Imaging Results:  Relevant Lab Results:   Additional Information SSN: [id number]   Patient is on dialysis at [facility].  Mearl Latin, LCSWA

## 2016-04-20 NOTE — Progress Notes (Signed)
Subjective:  S/P HD last night- removed one liter- low BFR- knows that she is in the hospital  Objective Vital signs in last 24 hours: Vitals:   04/20/16 0300 04/20/16 0324 04/20/16 0406 04/20/16 0518  BP: (!) 170/76  (!) 168/67 (!) 174/64  Pulse: 81  77   Resp: (!) 27  20   Temp: 97.9 F (36.6 C)  97.9 F (36.6 C) 97.3 F (36.3 C)  TempSrc: Oral  Oral Oral  SpO2: 98%  97%   Weight:  43.8 kg (96 lb 9 oz)    Height:       Weight change:   Intake/Output Summary (Last 24 hours) at 04/20/16 0716 Last data filed at 04/20/16 0600  Gross per 24 hour  Intake              160 ml  Output             1000 ml  Net             -840 ml    HD Prescription TTS [facility] 4 hours 300/A1.5 EDW 42 kg 2K 2.25 Ca bath Mircera 200 mcg Q2weeks (last dosed 8/29)   Assessment/ Plan: Pt is a 82 y.o. yo female who was admitted on 04/19/2016 with uremia and FTT after missing HD for 13 days Assessment/Plan: 1. FTT- I agree with case manager consult to get involved- maybe palliative care as well. apparently she desires to continue HD and not sign off.  She will need a more structured environment so that it is not up to her to get to HD if this will be the plan.  She is again refusing placement 2. ESRD - normally TTS at [facility]- missed for 13 days- done last night-will do today and then tomorrow to get back on schedule 3. Anemia- hgb 11.5- received mircera on 8/29- will be due this week- will dose aranesp 4. Secondary hyperparathyroidism- not on vit D or binders at present.  Phos was 7.1 but suspect serial dialysis will bring it down 5. HTN/volume- BP is up likely due to volume and also likely not to be taking antihypertensives- on amlodipine/hydralazine here- serial dialysis for volume lowering  6. Hypoglycemia and hypoalbuminemia- more part of this FTT picture- on dextrose drip right now  Natalie Wolfe A    Labs: Basic Metabolic Panel:  Recent Labs Lab 04/19/16 1657 04/19/16 1715  04/19/16 2044 04/20/16 0204 04/20/16 0428  NA 129* 126*  --   --  132*  K 5.9* 5.6* 5.3* 4.0 3.8  CL 85* 89*  --   --  91*  CO2 17*  --   --   --  21*  GLUCOSE 59* 55*  --   --  104*  BUN 171* >140*  --   --  88*  CREATININE 19.30* >18.00*  --   --  12.57*  CALCIUM 9.3  --   --   --  9.1  PHOS  --   --   --   --  7.1*   Liver Function Tests:  Recent Labs Lab 04/19/16 1657 04/20/16 0428  AST 25  --   ALT 26  --   ALKPHOS 91  --   BILITOT 1.4*  --   PROT 7.6  --   ALBUMIN 3.5 3.0*   No results for input(s): LIPASE, AMYLASE in the last 168 hours. No results for input(s): AMMONIA in the last 168 hours. CBC:  Recent Labs Lab 04/19/16 1657 04/19/16 1715 04/20/16 0427  WBC 3.9*  --  3.9*  NEUTROABS 2.9  --   --   HGB 12.2 14.3 11.5*  HCT 36.9 42.0 35.0*  MCV 96.1  --  95.6  PLT 147*  --  126*   Cardiac Enzymes: No results for input(s): CKTOTAL, CKMB, CKMBINDEX, TROPONINI in the last 168 hours. CBG:  Recent Labs Lab 04/19/16 1951 04/19/16 2033 04/20/16 0109 04/20/16 0257 04/20/16 0412  GLUCAP 49* 156* 79 63* 110*    Iron Studies: No results for input(s): IRON, TIBC, TRANSFERRIN, FERRITIN in the last 72 hours. Studies/Results: Dg Chest 2 View  Result Date: 04/19/2016 CLINICAL DATA:  Missed dialysis for the past 2 weeks. Chronic kidney disease. Diabetes. EXAM: CHEST  2 VIEW COMPARISON:  04/16/2015. FINDINGS: Mildly progressive enlargement of the cardiac silhouette. Mildly increased prominence of the pulmonary vasculature and interstitial markings. Small bilateral pleural effusions. Diffuse osteopenia. Left axillary and upper arm stent. IMPRESSION: Mild changes of congestive heart failure. Electronically Signed   By: Beckie SaltsSteven  Reid M.D.   On: 04/19/2016 17:39   Ct Head Wo Contrast  Result Date: 04/19/2016 CLINICAL DATA:  82 year old diabetic female on dialysis. Weakness. No dialysis for the past 2 weeks. Initial encounter. EXAM: CT HEAD WITHOUT CONTRAST CT CERVICAL  SPINE WITHOUT CONTRAST TECHNIQUE: Multidetector CT imaging of the head and cervical spine was performed following the standard protocol without intravenous contrast. Multiplanar CT image reconstructions of the cervical spine were also generated. COMPARISON:  03/12/2015 CT head and cervical spine. FINDINGS: CT HEAD FINDINGS Brain: No intracranial hemorrhage or CT evidence of large acute infarct. Remote right cerebellar infarct.  Chronic microvascular changes. Global atrophy without hydrocephalus. No intracranial mass lesion noted on this unenhanced exam. Vascular: Vascular calcifications. Skull: No worrisome abnormality. Sinuses/Orbits: Minimal mucosal thickening sphenoid sinus air cells bilaterally. Other: Negative. CT CERVICAL SPINE FINDINGS Alignment: No cervical spine fracture or malalignment. Degenerative changes cervical spine similar to prior exam without destruction to suggest acute infection. Cervical spondylotic changes contribute to various degrees of spinal stenosis/ foraminal narrowing most notable on the left at the C5-6 level. No worrisome neck mass.  Carotid bifurcation calcifications. No worrisome upper lung mass. IMPRESSION: CT HEAD No intracranial hemorrhage or CT evidence of large acute infarct. Remote right cerebellar infarct. Chronic microvascular changes. Global atrophy without hydrocephalus. Minimal mucosal thickening sphenoid sinus air cells bilaterally. CT CERVICAL SPINE No cervical spine fracture or malalignment. Degenerative changes cervical spine similar to prior exam. Electronically Signed   By: Lacy DuverneySteven  Olson M.D.   On: 04/19/2016 17:35   Ct Cervical Spine Wo Contrast  Result Date: 04/19/2016 CLINICAL DATA:  82 year old diabetic female on dialysis. Weakness. No dialysis for the past 2 weeks. Initial encounter. EXAM: CT HEAD WITHOUT CONTRAST CT CERVICAL SPINE WITHOUT CONTRAST TECHNIQUE: Multidetector CT imaging of the head and cervical spine was performed following the standard  protocol without intravenous contrast. Multiplanar CT image reconstructions of the cervical spine were also generated. COMPARISON:  03/12/2015 CT head and cervical spine. FINDINGS: CT HEAD FINDINGS Brain: No intracranial hemorrhage or CT evidence of large acute infarct. Remote right cerebellar infarct.  Chronic microvascular changes. Global atrophy without hydrocephalus. No intracranial mass lesion noted on this unenhanced exam. Vascular: Vascular calcifications. Skull: No worrisome abnormality. Sinuses/Orbits: Minimal mucosal thickening sphenoid sinus air cells bilaterally. Other: Negative. CT CERVICAL SPINE FINDINGS Alignment: No cervical spine fracture or malalignment. Degenerative changes cervical spine similar to prior exam without destruction to suggest acute infection. Cervical spondylotic changes contribute to various degrees of spinal stenosis/ foraminal narrowing  most notable on the left at the C5-6 level. No worrisome neck mass.  Carotid bifurcation calcifications. No worrisome upper lung mass. IMPRESSION: CT HEAD No intracranial hemorrhage or CT evidence of large acute infarct. Remote right cerebellar infarct. Chronic microvascular changes. Global atrophy without hydrocephalus. Minimal mucosal thickening sphenoid sinus air cells bilaterally. CT CERVICAL SPINE No cervical spine fracture or malalignment. Degenerative changes cervical spine similar to prior exam. Electronically Signed   By: Lacy Duverney M.D.   On: 04/19/2016 17:35   Medications: Infusions: . dextrose 1,000 mL (04/20/16 0316)    Scheduled Medications: . amLODipine  10 mg Oral QHS  . hydrALAZINE  25 mg Oral Q8H  . sodium chloride flush  3 mL Intravenous Q12H  . sodium chloride flush  3 mL Intravenous Q12H    have reviewed scheduled and prn medications.  Physical Exam: General: alert, did not remember that she missed HD for 2 weeks- "i dont mess around with my dialysis" thin Heart: RRR Lungs: clear Abdomen: soft, non  tender Extremities: min edema Dialysis Access: left upper arm AVF    04/20/2016,7:16 AM  LOS: 1 day

## 2016-04-20 NOTE — Progress Notes (Signed)
Upon arrival from dialysis at 0110, RN asked pt how her dialysis treatment went and pt did not know she had just received dialysis.  Pts BP is still in the 180's - her hydralazine and amlodipine were given and if BP still elevated will contact MD for PRN.  CBG is 79.  RN will continue to monitor.

## 2016-04-21 DIAGNOSIS — Z515 Encounter for palliative care: Secondary | ICD-10-CM

## 2016-04-21 DIAGNOSIS — E1165 Type 2 diabetes mellitus with hyperglycemia: Secondary | ICD-10-CM

## 2016-04-21 DIAGNOSIS — N19 Unspecified kidney failure: Secondary | ICD-10-CM

## 2016-04-21 DIAGNOSIS — E1121 Type 2 diabetes mellitus with diabetic nephropathy: Secondary | ICD-10-CM

## 2016-04-21 DIAGNOSIS — N186 End stage renal disease: Secondary | ICD-10-CM

## 2016-04-21 DIAGNOSIS — I10 Essential (primary) hypertension: Secondary | ICD-10-CM

## 2016-04-21 DIAGNOSIS — Z66 Do not resuscitate: Secondary | ICD-10-CM

## 2016-04-21 LAB — POTASSIUM
POTASSIUM: 3.7 mmol/L (ref 3.5–5.1)
POTASSIUM: 3.7 mmol/L (ref 3.5–5.1)
POTASSIUM: 3.4 mmol/L — AB (ref 3.5–5.1)
POTASSIUM: 3.4 mmol/L — AB (ref 3.5–5.1)
Potassium: 3.4 mmol/L — ABNORMAL LOW (ref 3.5–5.1)
Potassium: 3.9 mmol/L (ref 3.5–5.1)

## 2016-04-21 LAB — GLUCOSE, CAPILLARY: GLUCOSE-CAPILLARY: 133 mg/dL — AB (ref 65–99)

## 2016-04-21 LAB — T4, FREE: FREE T4: 1.13 ng/dL — AB (ref 0.61–1.12)

## 2016-04-21 LAB — RPR: RPR Ser Ql: NONREACTIVE

## 2016-04-21 MED ORDER — DARBEPOETIN ALFA 100 MCG/0.5ML IJ SOSY
100.0000 ug | PREFILLED_SYRINGE | INTRAMUSCULAR | Status: DC
  Administered 2016-04-22: 100 ug via INTRAVENOUS
  Filled 2016-04-21: qty 0.5

## 2016-04-21 MED ORDER — SEVELAMER CARBONATE 800 MG PO TABS
800.0000 mg | ORAL_TABLET | Freq: Three times a day (TID) | ORAL | Status: DC
  Administered 2016-04-21 – 2016-04-22 (×2): 800 mg via ORAL
  Filled 2016-04-21 (×3): qty 1

## 2016-04-21 MED ORDER — NEPRO/CARBSTEADY PO LIQD
237.0000 mL | Freq: Three times a day (TID) | ORAL | Status: DC
  Administered 2016-04-21 – 2016-04-23 (×3): 237 mL via ORAL

## 2016-04-21 NOTE — Consult Note (Signed)
Consultation Note Date: 04/21/2016   Patient Name: Natalie Wolfe  DOB: 29-Feb-1930  MRN: [id number]  Age / Sex: 82 y.o., female  PCP: Estill Dooms, MD Referring Physician: Reyne Dumas, MD  Reason for Consultation: Establishing goals of care  HPI/Patient Profile: 82 y.o. female  with past medical history of ESRD on hemodialysis, dementia, failure to thrive, CHF, DM, and anemia admitted on 04/19/2016 with weakness. Patient brought in by EMS and had not had dialysis since 04/06/16. Diagnosed with uremia and hyperkalemia. Nephrology consulted. Leukopenia upon admission with unclear etiology. Hypertensive on admission most likely due to volume-excess. Patient received HD upon admission. Palliative Medicine consultation for goals of care and advance directives.   Clinical Assessment and Goals of Care: Imogene Burn, [place] and I met with Ms. Amara this morning. She is alert and oriented to the hospital, pleasantly confused. States she has been on dialysis for awhile and she would like to continue treatments at this time. Patient tells [place] she lives alone but has many cousins that are supportive and that [place] visits often. She is an only child and is not married and does not have any children. She tells [place] that she does not eat often, mostly just drinks ensure, water, and sprite. She states she has been very weak and having falls at home. She does not seem to remember that she missed multiple days of dialysis. Discussed code status with patient. If her heart was to stop, and she was to die naturally, she would not want to be resuscitated or put on life support. Patient states she would want Iona Beard to make medical decisions for her if she was not able to make them herself.   Spoke with her cousin, Iona Beard, via telephone. Iona Beard has been very supportive of Ms. Chauvin. He moved her to [facility] after the death of her mother so  she was not alone. He states she has been on dialysis since February 2016. She lives alone in an apartment complex. Iona Beard tries to check on her often but Ms. Vazques is not able to let him in the building because she cannot remember which button it is. Every Monday and Friday he would meet her downstairs so she can open the door for him. Previously, neighbors would let him in the main door of the complex but no longer able to do so because of strict management. Iona Beard tells me that Ms. Lheureux has been sleeping a lot the last eight months and rarely eats. Another cousin died in 03/21/2023. Iona Beard states Ms. Kumari has greatly declined since the death of this family member, thinking she went into depression. 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 Iona Beard knows she is falling because of bruises, but she will not admit to them. She either does not take her medications or takes them twice in a day because she sleeps with all the lights on/curtains closed and cannot differentiate day from night. Iona Beard tries to just do  what she says in order to prevent making her upset and say hurtful things to him. Iona Beard does tell me that she has talked about "not going back to dialysis" and "maybe I will fall asleep and never wake up one day." He is very concerned and has told her that she needs to "check into a nursing home" but she become angry when he mentions this.  Discussed with Iona Beard that when asked about resuscitation and life support, Ms. Cragin said no to these aggressive interventions. He agrees with DNR. Discussed with Iona Beard that if patient would wish to discontinue dialysis, hospice services would be appropriate for her. Also told Iona Beard is does not seem safe for the patient to return home and that she will most likely need skilled nursing facility placement.   NEXT OF KIN-Patient wants cousin, Iona Beard to make medical decisions with her.     SUMMARY OF RECOMMENDATIONS    DNR/DNI per patient. Completed a durable DNR.   Will discuss HCPOA with Iona Beard on 9/14 in am.   Patient wishes to continue dialysis.  Continue discussions with patient and family regarding disposition, most likely SNF.  If refusing SNF, patient may need psych consult to evaluate competency to make decisions.   Code Status/Advance Care Planning:  DNR   Symptom Management:   Denies pain or discomfort.   Palliative Prophylaxis:   Aspiration and Delirium Protocol  Additional Recommendations (Limitations, Scope, Preferences):  Full Scope Treatment-except DNR  Psycho-social/Spiritual:   Desire for further Chaplaincy support:no  Additional Recommendations: Caregiving  Support/Resources and Education on Hospice  Prognosis:   < 3 months-in the setting of ESRD, FTT, and declining nutrition and functional status.   Discharge Planning: To Be Determined -most likely SNF     Primary Diagnoses: Present on Admission: . ESRD needing dialysis ([place]) . (Resolved) DM type 2, uncontrolled, with renal complications ([place]) . Essential hypertension . Hyperkalemia . Hypoglycemia . Thrombocytopenia ([place]) . Leukopenia . Uremia . Hypothermia . Uremia of renal origin . Failure to thrive in adult   I have reviewed the medical record, interviewed the patient and family, and examined the patient. The following aspects are pertinent.  Past Medical History:  Diagnosis Date  . Anemia, unspecified   . Cataract   . CHF (congestive heart failure) ([place])   . Chronic kidney disease, stage II (mild)    [facility]  . Dementia    forgetful, .  . Failure to thrive in adult 03/09/2015  . Insomnia, unspecified   . Memory loss 02/10/2015  . Nocturia   . Obesity, unspecified   . Other and unspecified hyperlipidemia   . Other anxiety states   . Other atopic dermatitis and related conditions   . Pain in joint, lower leg   . PVD  (peripheral vascular disease) ([place])   . Rash and other nonspecific skin eruption   . Routine general medical examination at a health care facility   . Thrombocytopenia ([place])   . Type II or unspecified type diabetes mellitus with renal manifestations, not stated as uncontrolled   . Unspecified essential hypertension   . Unspecified vitamin D deficiency   . Urinary frequency    Social History   Social History  . Marital status: Widowed    Spouse name: N/A  . Number of children: N/A  . Years of education: N/A   Social History Main Topics  . Smoking status: Former Smoker    Types: Cigarettes    Quit date: 08/10/2000  . Smokeless  tobacco: Never Used  . Alcohol use No  . Drug use: No  . Sexual activity: No   Other Topics Concern  . None   Social History Narrative  . None   Family History  Problem Relation Age of Onset  . Diabetes Mother    Scheduled Meds: . amLODipine  10 mg Oral QHS  . [START ON 04/22/2016] darbepoetin (ARANESP) injection - DIALYSIS  100 mcg Intravenous Q Thu-HD  . feeding supplement (NEPRO CARB STEADY)  237 mL Oral TID BM  . hydrALAZINE  25 mg Oral Q8H  . sevelamer carbonate  800 mg Oral TID WC  . sodium chloride flush  3 mL Intravenous Q12H  . sodium chloride flush  3 mL Intravenous Q12H   Continuous Infusions: . dextrose 1,000 mL (04/20/16 0316)   PRN Meds:.sodium chloride, acetaminophen **OR** acetaminophen, ondansetron **OR** ondansetron (ZOFRAN) IV, polyethylene glycol, sodium chloride flush Medications Prior to Admission:  Prior to Admission medications   Medication Sig Start Date End Date Taking? Authorizing Provider  amLODipine (NORVASC) 10 MG tablet Take 10 mg by mouth at bedtime.    Historical Provider, MD  carvedilol (COREG) 12.5 MG tablet One twice daily to strengthen the heart and control BP Patient not taking: Reported on 04/19/2016 02/12/15   Estill Dooms, MD  docusate sodium (COLACE) 100 MG capsule Take 1 capsule (100 mg total) by mouth  daily. To prevent constipation Patient not taking: Reported on 04/19/2016 09/06/15   Sharlett Iles, MD  ENSURE (ENSURE) Take 237 mLs by mouth 3 (three) times daily between meals.    Historical Provider, MD  ferrous sulfate 325 (65 FE) MG tablet Take 1 tablet (325 mg total) by mouth daily. Patient not taking: Reported on 04/19/2016 09/06/15   Sharlett Iles, MD  fluocinonide cream (LIDEX) 5.46 % Apply 1 application topically 2 (two) times daily. Apply from neck down twice a day (provided by dermatology).    Historical Provider, MD  hydrALAZINE (APRESOLINE) 25 MG tablet Take 1 tablet (25 mg total) by mouth every 8 (eight) hours. Patient not taking: Reported on 04/19/2016 11/07/14   Estill Dooms, MD  hydrocortisone 2.5 % lotion Apply 1 application topically daily. Apply to face 09/05/15   Historical Provider, MD  lidocaine-prilocaine (EMLA) cream Apply 1 application topically daily as needed (prior to dialysis).     Historical Provider, MD  multivitamin (RENA-VIT) TABS tablet Take 1 tablet by mouth at bedtime. Patient not taking: Reported on 04/19/2016 03/15/15   Velvet Bathe, MD  Nutritional Supplements (FEEDING SUPPLEMENT, NEPRO CARB STEADY,) LIQD Take 237 mLs by mouth as needed (missed meal during dialysis.). Patient not taking: Reported on 04/19/2016 03/15/15   Velvet Bathe, MD  sertraline (ZOLOFT) 50 MG tablet Take 1 tablet (50 mg total) by mouth daily. Patient must schedule and keep appointment for before any more refills. Patient not taking: Reported on 04/19/2016 01/16/16   Estill Dooms, MD   Allergies  Allergen Reactions  . Heparin Other (See Comments)    Positive Hep Induced Plt Ab and SRA 09/12/2014 and 09/17/2014 (2/9 mis-reported as negative by lab). Pt rec'd SQ heparin and heparin w/ HD prior to + results.   Review of Systems  Constitutional: Positive for activity change, appetite change and fatigue.  All other systems reviewed and are negative.   Physical Exam  Constitutional:  She is cooperative.  Cardiovascular: Regular rhythm and normal heart sounds.   Pulmonary/Chest: Effort normal and breath sounds normal. No accessory muscle usage. No  respiratory distress.  Abdominal: Soft. Bowel sounds are normal. There is no tenderness.  Neurological: She is alert.  Oriented to person, place, situation but forgetful.   Skin: Skin is warm and dry.  AVF LUA +bruit/thrill  Psychiatric: She has a normal mood and affect. Her speech is normal and behavior is normal. Thought content normal.  Nursing note and vitals reviewed.   Vital Signs: BP (!) 148/90   Pulse 82   Temp 97.2 F (36.2 C) (Oral)   Resp 20   Ht 5' 3" (1.6 m)   Wt 41.6 kg (91 lb 11.4 oz)   SpO2 98%   BMI 16.25 kg/m  Pain Assessment: No/denies pain   Pain Score: Asleep   SpO2: SpO2: 98 % O2 Device:SpO2: 98 % O2 Flow Rate: .   IO: Intake/output summary:   Intake/Output Summary (Last 24 hours) at 04/21/16 1547 Last data filed at 04/21/16 1100  Gross per 24 hour  Intake              360 ml  Output                0 ml  Net              360 ml    LBM: Last BM Date: 04/17/16 Baseline Weight: Weight: 43.5 kg (95 lb 14.4 oz) Most recent weight: Weight: 41.6 kg (91 lb 11.4 oz)     Palliative Assessment/Data: PPS 40%   Flowsheet Rows   Flowsheet Row Most Recent Value  Intake Tab  Referral Department  Hospitalist  Unit at Time of Referral  Intermediate Care Unit  Palliative Care Primary Diagnosis  Nephrology  Date Notified  04/20/16  Palliative Care Type  New Palliative care  Reason for referral  Clarify Goals of Care  Date of Admission  04/19/16  # of days IP prior to Palliative referral  1  Clinical Assessment  Palliative Performance Scale Score  40%  Psychosocial & Spiritual Assessment  Palliative Care Outcomes  Patient/Family meeting held?  Yes  Who was at the meeting?  patient (spoke with cousin, Iona Beard, via telephone)  Palliative Care Outcomes  Clarified goals of care, Changed CPR  status, Completed durable DNR      Time In: 0940 Time Out: 1100 Time Total: 45mn Greater than 50%  of this time was spent counseling and coordinating care related to the above assessment and plan.  Signed by:  MIhor Dow FNP-C Palliative Medicine Team  Phone: [phone]: [phone]  Please contact Palliative Medicine Team phone at [phone] questions and concerns.  For individual provider: See AShea Evans

## 2016-04-21 NOTE — Progress Notes (Signed)
[facility] PROGRESS NOTE  Natalie Wolfe ZOX:[id number] DOB: September 23, 1929 DOA: 04/19/2016 PCP: Murray Hodgkins, MD  Interim summary and HPI 81 y.o. female with medical history significant for dementia, end-stage renal disease on hemodialysis, and hypertension who presents the emergency department with EMS after missing her dialysis for close to 2 weeks. Patient lives alone and has a cousin who cares for her, but reports that the patient often does not let him into the building. She is reportedly eaten very little in recent weeks, there is suspicion that she has not taken her medications, and she has not been to dialysis since 04/06/2016. The dialysis center is reportedly been calling the patient encouraging her to come in, but she reports being too weak. EMS was activated today in order to get the patient to the emergency department for dialysis. She denies any complaints. Specifically, she denies chest pain, palpitations, cough, or dyspnea. Patient denies any abdominal pain, nausea, vomiting, or diarrhea. She denies depression or anxiety and denies suicidal ideations or hallucinations.  Assessment/Plan: 1. ESRD with uremia and hyperkalemia  - Patient has gone 12 days without HD, last session 04/06/16  - Potassium 5.9 on admission without EKG changes, BUN 171, no resp distress, mild vascular congestion appreciated on CXR  - Nephrology is on board and looking to perform three days of HD consecutively to be back on schedule and to repair volume/electrolytes impairment slowly  . Patient disoriented, has not been able to get herself to HD or care for herself living alone  2. Hypoglycemia  - Likely d/t renal failure and poor nutrition - Treated with dextrose in ED, continue frequent CBG's  - Encourage oral intake  -will add feeding supplements    3. Leukopenia, thrombocytopenia: unclear etiology - WBC 3,900 on admission, previously normal  - Sepsis considered, but lactate reassuring and no  potential source identified  - Suspect leukopenia may be d/t poor nutrition and uremia Vitamin B-12 within normal limits, TSH is slightly elevated, check free T4 - Platelet count 147,000 on admission; intermittently low in past; B12 and folate within normal limits - No s/s of active blood-loss   4. Hypertension  - BP elevated on admission; volume-excess likely contributing  - HD ordered and dictated by nephrology  - Continue Norvasc and hydralazine   - will adjust medications as needed   5. Adult failure to thrive - Pt living alone, barely eating per family, with intermittent falls and missed HD for 12 days straight.  - Pt denies depression, had previously been on Zoloft before, but she stopped taking it - She reports a desire to continue HD treatment and is in agreement with rehab if needed  -will follow evaluation from PT and have palliative care to evaluate her for GOC and directives management.   -will use nepro BID and ask nutritional service to evaluate  RPR negative   Code Status: Full Family Communication: no family at bedside  Disposition Plan:  SNF. Also will ask palliative care to see and discussed GOC and advance directives    Consultants:  Renal service  Palliative Care   Procedures:  See below for x-ray reports   Inpatient HD  Antibiotics:  None   HPI/Subjective: Afebrile, no CP, no SOB. Found walking down hall with hospital bags in each hand. "I'm going home-I need to get back in my bed!"   Objective: Vitals:   04/21/16 0645 04/21/16 0825  BP: (!) 161/61 (!) 155/59  Pulse: 84 77  Resp: 19 20  Temp: 97.5  F (36.4 C) 97.2 F (36.2 C)    Intake/Output Summary (Last 24 hours) at 04/21/16 0858 Last data filed at 04/21/16 0646  Gross per 24 hour  Intake              380 ml  Output             1000 ml  Net             -620 ml   Filed Weights   04/20/16 1147 04/20/16 1855 04/20/16 2139  Weight: 40.8 kg (89 lb 15.2 oz) 41.6 kg (91 lb 11.4 oz)  41.6 kg (91 lb 11.4 oz)    Exam:   General:  Afebrile, no CP, oriented X2 (person and place),  Underweight, frail in no acute distress. Patient is having HD.  Cardiovascular: S1 and S2, no rubs or gallops  Respiratory:good air movement, no wheezing   Abdomen: soft, NT,ND, positive BS  Musculoskeletal: no edema, no cyanosis   Data Reviewed: Basic Metabolic Panel:  Recent Labs Lab 04/19/16 1657 04/19/16 1715  04/20/16 0428 04/20/16 0743 04/20/16 1640 04/20/16 2043 04/21/16 0000 04/21/16 0419  NA 129* 126*  --  132*  --   --   --   --   --   K 5.9* 5.6*  < > 3.8 4.1 3.4* 3.2* 3.4* 3.7  CL 85* 89*  --  91*  --   --   --   --   --   CO2 17*  --   --  21*  --   --   --   --   --   GLUCOSE 59* 55*  --  104*  --   --   --   --   --   BUN 171* >140*  --  88*  --   --   --   --   --   CREATININE 19.30* >18.00*  --  12.57*  --   --   --   --   --   CALCIUM 9.3  --   --  9.1  --   --   --   --   --   PHOS  --   --   --  7.1*  --   --   --   --   --   < > = values in this interval not displayed. Liver Function Tests:  Recent Labs Lab 04/19/16 1657 04/20/16 0428  AST 25  --   ALT 26  --   ALKPHOS 91  --   BILITOT 1.4*  --   PROT 7.6  --   ALBUMIN 3.5 3.0*   CBC:  Recent Labs Lab 04/19/16 1657 04/19/16 1715 04/20/16 0427  WBC 3.9*  --  3.9*  NEUTROABS 2.9  --   --   HGB 12.2 14.3 11.5*  HCT 36.9 42.0 35.0*  MCV 96.1  --  95.6  PLT 147*  --  126*   CBG:  Recent Labs Lab 04/19/16 2033 04/20/16 0109 04/20/16 0257 04/20/16 0412 04/20/16 0804  GLUCAP 156* 79 63* 110* 85    Recent Results (from the past 240 hour(s))  MRSA PCR Screening     Status: None   Collection Time: 04/19/16  8:33 PM  Result Value Ref Range Status   MRSA by PCR NEGATIVE NEGATIVE Final    Comment:        The GeneXpert MRSA Assay (FDA approved for NASAL specimens only), is one component of a comprehensive  MRSA colonization surveillance program. It is not intended to diagnose  MRSA infection nor to guide or monitor treatment for MRSA infections.   Culture, blood (routine x 2)     Status: None (Preliminary result)   Collection Time: 04/19/16  9:14 PM  Result Value Ref Range Status   Specimen Description BLOOD LEFT HAND  Final   Special Requests BOTTLES DRAWN AEROBIC ONLY 4CC  Final   Culture NO GROWTH < 24 HOURS  Final   Report Status PENDING  Incomplete  Culture, blood (routine x 2)     Status: None (Preliminary result)   Collection Time: 04/19/16 11:21 PM  Result Value Ref Range Status   Specimen Description BLOOD LEFT ARM  Final   Special Requests BOTTLES DRAWN AEROBIC AND ANAEROBIC 5CC  Final   Culture NO GROWTH < 24 HOURS  Final   Report Status PENDING  Incomplete     Studies: Dg Chest 2 View  Result Date: 04/19/2016 CLINICAL DATA:  Missed dialysis for the past 2 weeks. Chronic kidney disease. Diabetes. EXAM: CHEST  2 VIEW COMPARISON:  04/16/2015. FINDINGS: Mildly progressive enlargement of the cardiac silhouette. Mildly increased prominence of the pulmonary vasculature and interstitial markings. Small bilateral pleural effusions. Diffuse osteopenia. Left axillary and upper arm stent. IMPRESSION: Mild changes of congestive heart failure. Electronically Signed   By: Beckie SaltsSteven  Reid M.D.   On: 04/19/2016 17:39   Ct Head Wo Contrast  Result Date: 04/19/2016 CLINICAL DATA:  82 year old diabetic female on dialysis. Weakness. No dialysis for the past 2 weeks. Initial encounter. EXAM: CT HEAD WITHOUT CONTRAST CT CERVICAL SPINE WITHOUT CONTRAST TECHNIQUE: Multidetector CT imaging of the head and cervical spine was performed following the standard protocol without intravenous contrast. Multiplanar CT image reconstructions of the cervical spine were also generated. COMPARISON:  03/12/2015 CT head and cervical spine. FINDINGS: CT HEAD FINDINGS Brain: No intracranial hemorrhage or CT evidence of large acute infarct. Remote right cerebellar infarct.  Chronic microvascular  changes. Global atrophy without hydrocephalus. No intracranial mass lesion noted on this unenhanced exam. Vascular: Vascular calcifications. Skull: No worrisome abnormality. Sinuses/Orbits: Minimal mucosal thickening sphenoid sinus air cells bilaterally. Other: Negative. CT CERVICAL SPINE FINDINGS Alignment: No cervical spine fracture or malalignment. Degenerative changes cervical spine similar to prior exam without destruction to suggest acute infection. Cervical spondylotic changes contribute to various degrees of spinal stenosis/ foraminal narrowing most notable on the left at the C5-6 level. No worrisome neck mass.  Carotid bifurcation calcifications. No worrisome upper lung mass. IMPRESSION: CT HEAD No intracranial hemorrhage or CT evidence of large acute infarct. Remote right cerebellar infarct. Chronic microvascular changes. Global atrophy without hydrocephalus. Minimal mucosal thickening sphenoid sinus air cells bilaterally. CT CERVICAL SPINE No cervical spine fracture or malalignment. Degenerative changes cervical spine similar to prior exam. Electronically Signed   By: Lacy DuverneySteven  Olson M.D.   On: 04/19/2016 17:35   Ct Cervical Spine Wo Contrast  Result Date: 04/19/2016 CLINICAL DATA:  82 year old diabetic female on dialysis. Weakness. No dialysis for the past 2 weeks. Initial encounter. EXAM: CT HEAD WITHOUT CONTRAST CT CERVICAL SPINE WITHOUT CONTRAST TECHNIQUE: Multidetector CT imaging of the head and cervical spine was performed following the standard protocol without intravenous contrast. Multiplanar CT image reconstructions of the cervical spine were also generated. COMPARISON:  03/12/2015 CT head and cervical spine. FINDINGS: CT HEAD FINDINGS Brain: No intracranial hemorrhage or CT evidence of large acute infarct. Remote right cerebellar infarct.  Chronic microvascular changes. Global atrophy without hydrocephalus. No intracranial mass lesion  noted on this unenhanced exam. Vascular: Vascular  calcifications. Skull: No worrisome abnormality. Sinuses/Orbits: Minimal mucosal thickening sphenoid sinus air cells bilaterally. Other: Negative. CT CERVICAL SPINE FINDINGS Alignment: No cervical spine fracture or malalignment. Degenerative changes cervical spine similar to prior exam without destruction to suggest acute infection. Cervical spondylotic changes contribute to various degrees of spinal stenosis/ foraminal narrowing most notable on the left at the C5-6 level. No worrisome neck mass.  Carotid bifurcation calcifications. No worrisome upper lung mass. IMPRESSION: CT HEAD No intracranial hemorrhage or CT evidence of large acute infarct. Remote right cerebellar infarct. Chronic microvascular changes. Global atrophy without hydrocephalus. Minimal mucosal thickening sphenoid sinus air cells bilaterally. CT CERVICAL SPINE No cervical spine fracture or malalignment. Degenerative changes cervical spine similar to prior exam. Electronically Signed   By: Lacy Duverney M.D.   On: 04/19/2016 17:35    Scheduled Meds: . amLODipine  10 mg Oral QHS  . feeding supplement (NEPRO CARB STEADY)  237 mL Oral BID BM  . hydrALAZINE  25 mg Oral Q8H  . sodium chloride flush  3 mL Intravenous Q12H  . sodium chloride flush  3 mL Intravenous Q12H   Continuous Infusions: . dextrose 1,000 mL (04/20/16 0316)    Principal Problem:   Uremia Active Problems:   Essential hypertension   Thrombocytopenia ([facility])   Hyperkalemia   Failure to thrive in adult   Hypoglycemia   ESRD needing dialysis ([facility])   Leukopenia   Hypothermia   Uremia of renal origin   Dehydration    Time spent: 30 minutes    [facility]  [facility] Pager [phone]. If 7PM-7AM, please contact night-coverage at [email or web address], password [facility] 04/21/2016, 8:58 AM  LOS: 2 days

## 2016-04-21 NOTE — Progress Notes (Signed)
Initial Nutrition Assessment  DOCUMENTATION CODES:   Underweight, Severe malnutrition in context of chronic illness  INTERVENTION:  Provide Nepro Shake po TID, each supplement provides 425 kcal and 19 grams protein.  Encourage adequate PO intake.   NUTRITION DIAGNOSIS:   Malnutrition related to chronic illness as evidenced by severe depletion of body fat, severe depletion of muscle mass.  GOAL:   Patient will meet greater than or equal to 90% of their needs  MONITOR:   PO intake, Supplement acceptance, Labs, Weight trends, Skin, I & O's  REASON FOR ASSESSMENT:   Consult Assessment of nutrition requirement/status  ASSESSMENT:   82 y.o. female with medical history significant for dementia, end-stage renal disease on hemodialysis, and hypertension who presents the emergency department with EMS after missing her dialysis for close to 2 weeks.  She is reportedly eaten very little in recent weeks, there is suspicion that she has not taken her medications, and she has not been to dialysis since 04/06/2016. Per MD note, Nephrology is on board and looking to perform three days of HD consecutively to be back on schedule and to repair volume/electrolytes impairment slowly.   Pt reports having a decreased appetite. Meal completion 100% at breakfast this AM. No family at bedside. Pt reports eating at least 3 meals a day at home Usual body weight unknown to patient. Pt currently has Nepro Shake ordered and has been consuming them. RD to modify order to provide Nepro Shake TID to aid in caloric and protein needs.   Nutrition-Focused physical exam completed. Findings are severe fat depletion, severe muscle depletion, and no edema.   Labs and medications reviewed. Phosphorous elevated at 7.1.  Diet Order:  Diet renal with fluid restriction Fluid restriction: 1200 mL Fluid; Room service appropriate? Yes; Fluid consistency: Thin  Skin:  Reviewed, no issues  Last BM:  9/9  Height:   Ht  Readings from Last 1 Encounters:  04/20/16 5\' 3"  (1.6 m)    Weight:   Wt Readings from Last 1 Encounters:  04/20/16 91 lb 11.4 oz (41.6 kg)    Ideal Body Weight:  52.27 kg  BMI:  Body mass index is 16.25 kg/m.  Estimated Nutritional Needs:   Kcal:  1400-1600  Protein:  65-75 grams  Fluid:  1.2 L/day  EDUCATION NEEDS:   No education needs identified at this time  Roslyn SmilingStephanie Arriona Prest, MS, RD, LDN Pager # [phone] After hours/ weekend pager # [phone]

## 2016-04-21 NOTE — Progress Notes (Signed)
[facility] Progress Note   Subjective:  Found walking down hall with hospital bags in each hand. "I'm going home-I need to get back in my bed!" Redirected to room, cooperative but clearly oriented only to self. No falls, no complaints.   Objective Vitals:   04/20/16 1855 04/20/16 2139 04/21/16 0645 04/21/16 0825  BP: (!) 156/53 (!) 168/66 (!) 161/61 (!) 155/59  Pulse: 75 80 84 77  Resp: 20 19 19 20   Temp: 98.2 F (36.8 C) 98.2 F (36.8 C) 97.5 F (36.4 C) 97.2 F (36.2 C)  TempSrc: Oral Oral Oral Oral  SpO2: 98% 96% 98% 98%  Weight: 41.6 kg (91 lb 11.4 oz) 41.6 kg (91 lb 11.4 oz)    Height: 5\' 3"  (1.6 m)      Physical Exam General: Frail, elderly lady in NAD Heart: S1,S2, 2/6 systolic M Lungs:BBS CTA A/P Abdomen: soft, nontender Extremities: No LE edema. Dialysis Access: LUA AVF + bruit  Dialysis Orders: TTS [facility] 4 hours 300/A1.5 EDW 42 kg 2K 2.25 Ca bath Mircera 200 mcg Q2weeks (last dosed 8/29)  Additional Objective Labs: Basic Metabolic Panel:  Recent Labs Lab 04/19/16 1657 04/19/16 1715  04/20/16 0428  04/20/16 2043 04/21/16 0000 04/21/16 0419  NA 129* 126*  --  132*  --   --   --   --   K 5.9* 5.6*  < > 3.8  < > 3.2* 3.4* 3.7  CL 85* 89*  --  91*  --   --   --   --   CO2 17*  --   --  21*  --   --   --   --   GLUCOSE 59* 55*  --  104*  --   --   --   --   BUN 171* >140*  --  88*  --   --   --   --   CREATININE 19.30* >18.00*  --  12.57*  --   --   --   --   CALCIUM 9.3  --   --  9.1  --   --   --   --   PHOS  --   --   --  7.1*  --   --   --   --   < > = values in this interval not displayed. Liver Function Tests:  Recent Labs Lab 04/19/16 1657 04/20/16 0428  AST 25  --   ALT 26  --   ALKPHOS 91  --   BILITOT 1.4*  --   PROT 7.6  --   ALBUMIN 3.5 3.0*   No results for input(s): LIPASE, AMYLASE in the last 168 hours. CBC:  Recent Labs Lab 04/19/16 1657 04/19/16 1715 04/20/16 0427  WBC 3.9*  --  3.9*  NEUTROABS  2.9  --   --   HGB 12.2 14.3 11.5*  HCT 36.9 42.0 35.0*  MCV 96.1  --  95.6  PLT 147*  --  126*   Blood Culture    Component Value Date/Time   SDES BLOOD LEFT ARM 04/19/2016 2321   SPECREQUEST BOTTLES DRAWN AEROBIC AND ANAEROBIC 5CC 04/19/2016 2321   CULT NO GROWTH < 24 HOURS 04/19/2016 2321   REPTSTATUS PENDING 04/19/2016 2321    Cardiac Enzymes: No results for input(s): CKTOTAL, CKMB, CKMBINDEX, TROPONINI in the last 168 hours. CBG:  Recent Labs Lab 04/19/16 2033 04/20/16 0109 04/20/16 0257 04/20/16 0412 04/20/16 0804  GLUCAP 156* 79 63* 110* 85  Iron Studies: No results for input(s): IRON, TIBC, TRANSFERRIN, FERRITIN in the last 72 hours. @lablastinr3 @ Studies/Results: Dg Chest 2 View  Result Date: 04/19/2016 CLINICAL DATA:  Missed dialysis for the past 2 weeks. Chronic kidney disease. Diabetes. EXAM: CHEST  2 VIEW COMPARISON:  04/16/2015. FINDINGS: Mildly progressive enlargement of the cardiac silhouette. Mildly increased prominence of the pulmonary vasculature and interstitial markings. Small bilateral pleural effusions. Diffuse osteopenia. Left axillary and upper arm stent. IMPRESSION: Mild changes of congestive heart failure. Electronically Signed   By: Beckie Salts M.D.   On: 04/19/2016 17:39   Ct Head Wo Contrast  Result Date: 04/19/2016 CLINICAL DATA:  82 year old diabetic female on dialysis. Weakness. No dialysis for the past 2 weeks. Initial encounter. EXAM: CT HEAD WITHOUT CONTRAST CT CERVICAL SPINE WITHOUT CONTRAST TECHNIQUE: Multidetector CT imaging of the head and cervical spine was performed following the standard protocol without intravenous contrast. Multiplanar CT image reconstructions of the cervical spine were also generated. COMPARISON:  03/12/2015 CT head and cervical spine. FINDINGS: CT HEAD FINDINGS Brain: No intracranial hemorrhage or CT evidence of large acute infarct. Remote right cerebellar infarct.  Chronic microvascular changes. Global atrophy  without hydrocephalus. No intracranial mass lesion noted on this unenhanced exam. Vascular: Vascular calcifications. Skull: No worrisome abnormality. Sinuses/Orbits: Minimal mucosal thickening sphenoid sinus air cells bilaterally. Other: Negative. CT CERVICAL SPINE FINDINGS Alignment: No cervical spine fracture or malalignment. Degenerative changes cervical spine similar to prior exam without destruction to suggest acute infection. Cervical spondylotic changes contribute to various degrees of spinal stenosis/ foraminal narrowing most notable on the left at the C5-6 level. No worrisome neck mass.  Carotid bifurcation calcifications. No worrisome upper lung mass. IMPRESSION: CT HEAD No intracranial hemorrhage or CT evidence of large acute infarct. Remote right cerebellar infarct. Chronic microvascular changes. Global atrophy without hydrocephalus. Minimal mucosal thickening sphenoid sinus air cells bilaterally. CT CERVICAL SPINE No cervical spine fracture or malalignment. Degenerative changes cervical spine similar to prior exam. Electronically Signed   By: Lacy Duverney M.D.   On: 04/19/2016 17:35   Ct Cervical Spine Wo Contrast  Result Date: 04/19/2016 CLINICAL DATA:  82 year old diabetic female on dialysis. Weakness. No dialysis for the past 2 weeks. Initial encounter. EXAM: CT HEAD WITHOUT CONTRAST CT CERVICAL SPINE WITHOUT CONTRAST TECHNIQUE: Multidetector CT imaging of the head and cervical spine was performed following the standard protocol without intravenous contrast. Multiplanar CT image reconstructions of the cervical spine were also generated. COMPARISON:  03/12/2015 CT head and cervical spine. FINDINGS: CT HEAD FINDINGS Brain: No intracranial hemorrhage or CT evidence of large acute infarct. Remote right cerebellar infarct.  Chronic microvascular changes. Global atrophy without hydrocephalus. No intracranial mass lesion noted on this unenhanced exam. Vascular: Vascular calcifications. Skull: No  worrisome abnormality. Sinuses/Orbits: Minimal mucosal thickening sphenoid sinus air cells bilaterally. Other: Negative. CT CERVICAL SPINE FINDINGS Alignment: No cervical spine fracture or malalignment. Degenerative changes cervical spine similar to prior exam without destruction to suggest acute infection. Cervical spondylotic changes contribute to various degrees of spinal stenosis/ foraminal narrowing most notable on the left at the C5-6 level. No worrisome neck mass.  Carotid bifurcation calcifications. No worrisome upper lung mass. IMPRESSION: CT HEAD No intracranial hemorrhage or CT evidence of large acute infarct. Remote right cerebellar infarct. Chronic microvascular changes. Global atrophy without hydrocephalus. Minimal mucosal thickening sphenoid sinus air cells bilaterally. CT CERVICAL SPINE No cervical spine fracture or malalignment. Degenerative changes cervical spine similar to prior exam. Electronically Signed   By: Ernie Hew.D.  On: 04/19/2016 17:35   Medications: . dextrose 1,000 mL (04/20/16 0316)   . amLODipine  10 mg Oral QHS  . feeding supplement (NEPRO CARB STEADY)  237 mL Oral BID BM  . hydrALAZINE  25 mg Oral Q8H  . sodium chloride flush  3 mL Intravenous Q12H  . sodium chloride flush  3 mL Intravenous Q12H     Assessment/Plan: 1. Non-adherence to HD/FTT-is being assessed for OP SNF placement. PT evaluation done. Patient disoriented, has not been able to get herself to HD or care for herself living alone. Palliative care consult mentioned in notes.  2. Hypoglycemia: Resolving. No longer on D10w. BS 63-110  3. ESRD - normally TTS at [facility]- missed for 13 days- Had HD 09/11 and 09/12. Now back on schedule. Next HD Tomorrow. K+3.7 3. Anemia- hgb 11.5- received mircera on 8/29- will be due this week- will dose aranesp 100 mcg IV with HD tomorrow.  4. Secondary hyperparathyroidism- not on vit D or binders at present. Ca 9.1 C Ca 9.9 Phos 7.1. Use 2.0 Ca bath, check renal  profile with HD tomorrow. Add renvela 5. HTN/volume- BP is up likely due to volume and also likely not to be taking antihypertensives- on amlodipine/hydralazine here- serial dialysis for volume lowering.  No change in meds today  6. Hypoglycemia and hypoalbuminemia- BS improving, albumin 3.0. Renal diet, Nepro.   Rita H. Brown NP-C 04/21/2016, 8:49 AM  [facility] [phone]  Patient seen and examined, agree with above note with above modifications. Elderly female with FTT including missing meds and dialysis.  PT consult, likely placement and also palliative care discussions  Annie SableKellie Yoni Lobos, MD 04/21/2016

## 2016-04-22 DIAGNOSIS — F0390 Unspecified dementia without behavioral disturbance: Secondary | ICD-10-CM

## 2016-04-22 DIAGNOSIS — Z66 Do not resuscitate: Secondary | ICD-10-CM

## 2016-04-22 DIAGNOSIS — E860 Dehydration: Secondary | ICD-10-CM

## 2016-04-22 DIAGNOSIS — Z7189 Other specified counseling: Secondary | ICD-10-CM

## 2016-04-22 LAB — CBC
HCT: 31.9 % — ABNORMAL LOW (ref 36.0–46.0)
Hemoglobin: 9.9 g/dL — ABNORMAL LOW (ref 12.0–15.0)
MCH: 30.7 pg (ref 26.0–34.0)
MCHC: 31 g/dL (ref 30.0–36.0)
MCV: 98.8 fL (ref 78.0–100.0)
Platelets: 99 K/uL — ABNORMAL LOW (ref 150–400)
RBC: 3.23 MIL/uL — ABNORMAL LOW (ref 3.87–5.11)
RDW: 15.8 % — ABNORMAL HIGH (ref 11.5–15.5)
WBC: 5.2 K/uL (ref 4.0–10.5)

## 2016-04-22 LAB — RENAL FUNCTION PANEL
Albumin: 2.7 g/dL — ABNORMAL LOW (ref 3.5–5.0)
Anion gap: 12 (ref 5–15)
BUN: 58 mg/dL — ABNORMAL HIGH (ref 6–20)
CO2: 25 mmol/L (ref 22–32)
Calcium: 8.7 mg/dL — ABNORMAL LOW (ref 8.9–10.3)
Chloride: 95 mmol/L — ABNORMAL LOW (ref 101–111)
Creatinine, Ser: 9.72 mg/dL — ABNORMAL HIGH (ref 0.44–1.00)
GFR calc Af Amer: 4 mL/min — ABNORMAL LOW (ref >60)
GFR calc non Af Amer: 3 mL/min — ABNORMAL LOW (ref >60)
Glucose, Bld: 115 mg/dL — ABNORMAL HIGH (ref 65–99)
Phosphorus: 4.5 mg/dL (ref 2.5–4.6)
Potassium: 3.8 mmol/L (ref 3.5–5.1)
Sodium: 132 mmol/L — ABNORMAL LOW (ref 135–145)

## 2016-04-22 LAB — IRON AND TIBC
IRON: 20 ug/dL — AB (ref 28–170)
SATURATION RATIOS: 13 % (ref 10.4–31.8)
TIBC: 157 ug/dL — AB (ref 250–450)
UIBC: 137 ug/dL

## 2016-04-22 LAB — POTASSIUM
POTASSIUM: 3.8 mmol/L (ref 3.5–5.1)
Potassium: 3.6 mmol/L (ref 3.5–5.1)
Potassium: 3.9 mmol/L (ref 3.5–5.1)

## 2016-04-22 LAB — GLUCOSE, CAPILLARY: GLUCOSE-CAPILLARY: 128 mg/dL — AB (ref 65–99)

## 2016-04-22 MED ORDER — DARBEPOETIN ALFA 100 MCG/0.5ML IJ SOSY
PREFILLED_SYRINGE | INTRAMUSCULAR | Status: AC
  Filled 2016-04-22: qty 0.5

## 2016-04-22 MED ORDER — LIDOCAINE HCL (PF) 1 % IJ SOLN: 5.0000 mL | INTRAMUSCULAR | Status: DC | PRN

## 2016-04-22 MED ORDER — SODIUM CHLORIDE 0.9 % IV SOLN: 100.0000 mL | INTRAVENOUS | Status: DC | PRN

## 2016-04-22 MED ORDER — PENTAFLUOROPROP-TETRAFLUOROETH EX AERO: 1.0000 "application " | INHALATION_SPRAY | CUTANEOUS | Status: DC | PRN

## 2016-04-22 MED ORDER — LIDOCAINE-PRILOCAINE 2.5-2.5 % EX CREA: 1.0000 "application " | TOPICAL_CREAM | CUTANEOUS | Status: DC | PRN

## 2016-04-22 NOTE — Progress Notes (Signed)
[facility] Progress Note   Subjective:    No more wandering.  Palliative c/s yesterday, now DNR.   Objective Vitals:   04/22/16 1100 04/22/16 1130 04/22/16 1200 04/22/16 1226  BP: (!) 129/59 (!) 127/54 (!) 129/58 137/60  Pulse: 77 75 71 75  Resp: (!) 21 (!) 23 20 20   Temp:    97.9 F (36.6 C)  TempSrc:    Oral  SpO2:      Weight:    39.5 kg (87 lb 1.3 oz)  Height:       Physical Exam General: Frail, elderly lady in NAD Heart: S1,S2, 2/6 systolic M Lungs:BBS CTA A/P Abdomen: soft, nontender Extremities: No LE edema. Dialysis Access: LUA AVF + bruit  Dialysis Orders: TTS [facility] 4 hours 300/A1.5 EDW 42 kg 2K 2.25 Ca bath Mircera 200 mcg Q2weeks (last dosed 8/29)  Additional Objective Labs: Basic Metabolic Panel:  Recent Labs Lab 04/19/16 1657 04/19/16 1715  04/20/16 0428  04/21/16 1950 04/22/16 0029 04/22/16 0840  NA 129* 126*  --  132*  --   --   --  132*  K 5.9* 5.6*  < > 3.8  < > 3.9 3.6 3.8  CL 85* 89*  --  91*  --   --   --  95*  CO2 17*  --   --  21*  --   --   --  25  GLUCOSE 59* 55*  --  104*  --   --   --  115*  BUN 171* >140*  --  88*  --   --   --  58*  CREATININE 19.30* >18.00*  --  12.57*  --   --   --  9.72*  CALCIUM 9.3  --   --  9.1  --   --   --  8.7*  PHOS  --   --   --  7.1*  --   --   --  4.5  < > = values in this interval not displayed. Liver Function Tests:  Recent Labs Lab 04/19/16 1657 04/20/16 0428 04/22/16 0840  AST 25  --   --   ALT 26  --   --   ALKPHOS 91  --   --   BILITOT 1.4*  --   --   PROT 7.6  --   --   ALBUMIN 3.5 3.0* 2.7*   No results for input(s): LIPASE, AMYLASE in the last 168 hours. CBC:  Recent Labs Lab 04/19/16 1657 04/19/16 1715 04/20/16 0427 04/22/16 0802  WBC 3.9*  --  3.9* 5.2  NEUTROABS 2.9  --   --   --   HGB 12.2 14.3 11.5* 9.9*  HCT 36.9 42.0 35.0* 31.9*  MCV 96.1  --  95.6 98.8  PLT 147*  --  126* 99*   Blood Culture    Component Value Date/Time   SDES BLOOD  LEFT ARM 04/19/2016 2321   SPECREQUEST BOTTLES DRAWN AEROBIC AND ANAEROBIC 5CC 04/19/2016 2321   CULT NO GROWTH 3 DAYS 04/19/2016 2321   REPTSTATUS PENDING 04/19/2016 2321    Cardiac Enzymes: No results for input(s): CKTOTAL, CKMB, CKMBINDEX, TROPONINI in the last 168 hours. CBG:  Recent Labs Lab 04/20/16 0257 04/20/16 0412 04/20/16 0804 04/21/16 0748 04/22/16 0745  GLUCAP 63* 110* 85 133* 128*   Iron Studies:   Recent Labs  04/22/16 0847  IRON 20*  TIBC 157*   @lablastinr3 @ Studies/Results: No results found. Medications: . dextrose 1,000  mL (04/20/16 0316)   . amLODipine  10 mg Oral QHS  . Darbepoetin Alfa      . darbepoetin (ARANESP) injection - DIALYSIS  100 mcg Intravenous Q Thu-HD  . feeding supplement (NEPRO CARB STEADY)  237 mL Oral TID BM  . hydrALAZINE  25 mg Oral Q8H  . sevelamer carbonate  800 mg Oral TID WC  . sodium chloride flush  3 mL Intravenous Q12H  . sodium chloride flush  3 mL Intravenous Q12H     Assessment/Plan: 1. Non-adherence to HD/FTT-is being assessed for OP SNF placement. PT evaluation done. Patient disoriented, has not been able to get herself to HD or care for herself living alone. Palliative care has been consulted.  Pt desires to continue dialysis.  This will be best accomplished if she is permanently placed in a SNF. 2. Hypoglycemia: Resolving. No longer on D10w. BS 63-110  3. ESRD - normally TTS at [facility]- missed for 13 days- Had HD 09/11 and 09/12. Now back on schedule. Next HD Saturday. K+3.7 3. Anemia- hgb 11.5- received mircera on 8/29- will be due this week- will dose aranesp 100 mcg IV with HD 9/14. 4. Secondary hyperparathyroidism- not on vit D or binders at present. Ca 9.1 C Ca 9.9 Phos 7.1. Use 2.0 Ca bath. Add renvela 5. HTN/volume- BP is up likely due to volume and also likely not to be taking antihypertensives- on amlodipine/hydralazine here- serial dialysis for volume lowering.  No change in meds today  6. Hypoglycemia  and hypoalbuminemia- BS improving, albumin 3.0. Renal diet, Nepro.

## 2016-04-22 NOTE — Procedures (Signed)
Patient seen on Hemodialysis.  Pleasantly confused, reports mild epigastric pain.  Some firmness in epigastric area, not a discrete mass ? Some guarding QB 300, UF goal 2L Palliative care consulted yesterday, pt now a DNR, Natalie Wolfe (cousin) exploring HCPOA for her.  She expressed desire to continue dialysis, I think this would be best accomplished in a more structured environment (ie a SNF) Treatment adjusted as needed.  [name] MD [facility]. Office # [phone] Pager # [phone] 8:52 AM

## 2016-04-22 NOTE — Progress Notes (Addendum)
[facility] PROGRESS NOTE  Natalie Wolfe ION:[id number] DOB: [birth year] DOA: 04/19/2016 PCP: Murray Hodgkins, MD  Interim summary and HPI 82 y.o. female with medical history significant for dementia, end-stage renal disease on hemodialysis, and hypertension who presents the emergency department with EMS after missing her dialysis for close to 2 weeks. Patient lives alone and has a cousin who cares for her, but reports that the patient often does not let him into the building. She is reportedly eaten very little in recent weeks, there is suspicion that she has not taken her medications, and she has not been to dialysis since 04/06/2016. The dialysis center is reportedly been calling the patient encouraging her to come in, but she reports being too weak. EMS was activated today in order to get the patient to the emergency department for dialysis. She denies any complaints. Specifically, she denies chest pain, palpitations, cough, or dyspnea. Patient denies any abdominal pain, nausea, vomiting, or diarrhea. She denies depression or anxiety and denies suicidal ideations or hallucinations.  Assessment/Plan: 1. ESRD with uremia and hyperkalemia  - Patient has gone 12 days without HD, last session 04/06/16  - Potassium 5.9 on admission without EKG changes, BUN 171, no resp distress, mild vascular congestion appreciated on CXR  - Nephrology is on board and looking to perform three days of HD consecutively to be back on schedule and to repair volume/electrolytes impairment slowly  . Patient disoriented, has not been able to get herself to HD or care for herself living alone  2. Hypoglycemia  - Likely d/t renal failure and poor nutrition - Treated with dextrose in ED, continue frequent CBG's  - Encourage oral intake  -will add feeding supplements     3. Leukopenia, thrombocytopenia: unclear etiology - WBC 3,900 on admission, previously normal  - Sepsis considered, but lactate reassuring and no  potential source identified  - Suspect leukopenia may be d/t poor nutrition and uremia Vitamin B-12 within normal limits, TSH is slightly elevated, check free T4 - Platelet count 147K>99K on admission; intermittently low in past; B12 and folate within normal limits - No s/s of active blood-loss , will monitor , may need hematology eval in the outpatient setting  has not been given any lovenox or heparin  4. Hypertension  - BP elevated on admission; volume-excess likely contributing  - HD ongoing  - Continue Norvasc and hydralazine   - will adjust medications as needed   5. Adult failure to thrive - Pt living alone, barely eating per family, with intermittent falls and missed HD for 12 days straight.  - Pt denies depression, had previously been on Zoloft before, but she stopped taking it - She reports a desire to continue HD treatment and is in agreement with rehab if needed  -will follow evaluation from PT and have palliative care to evaluate her for GOC and directives management.   -will use nepro BID and ask nutritional service to evaluate  RPR negative Psychiatric consultation to determine capacity was requested. Patient does not have capacity to make determinations regarding her health/placement. Patient needs a POA to make decisions for her. Appreciate psychiatric input   Code Status: DNR  Family Communication: no family at bedside  Disposition Plan:  SNF. Pending DNR/DNI per patient.  Will discuss HCPOA with Greggory Stallion on 9/14 in am   Consultants:  Renal service  Palliative Care   Procedures:  See below for x-ray reports   Inpatient HD  Antibiotics:  None   HPI/Subjective: Confused ,seen in HD ,  sdenies cp, sob   Objective: Vitals:   04/22/16 0930 04/22/16 1000  BP: (!) 130/57 (!) 129/58  Pulse: 73 74  Resp: 20 (!) 21  Temp:      Intake/Output Summary (Last 24 hours) at 04/22/16 1050 Last data filed at 04/22/16 0531  Gross per 24 hour  Intake               360 ml  Output                0 ml  Net              360 ml   Filed Weights   04/20/16 2139 04/21/16 2123 04/22/16 0815  Weight: 41.6 kg (91 lb 11.4 oz) 41.7 kg (91 lb 14.9 oz) 41.9 kg (92 lb 6 oz)    Exam:   General:  Afebrile, no CP, oriented X2 (person and place),  Underweight, frail in no acute distress. Patient is having HD.  Cardiovascular: S1 and S2, no rubs or gallops  Respiratory:good air movement, no wheezing   Abdomen: soft, NT,ND, positive BS  Musculoskeletal: no edema, no cyanosis   Data Reviewed: Basic Metabolic Panel:  Recent Labs Lab 04/19/16 1657 04/19/16 1715  04/20/16 0428  04/21/16 1232 04/21/16 1630 04/21/16 1950 04/22/16 0029 04/22/16 0840  NA 129* 126*  --  132*  --   --   --   --   --  132*  K 5.9* 5.6*  < > 3.8  < > 3.4* 3.4* 3.9 3.6 3.8  CL 85* 89*  --  91*  --   --   --   --   --  95*  CO2 17*  --   --  21*  --   --   --   --   --  25  GLUCOSE 59* 55*  --  104*  --   --   --   --   --  115*  BUN 171* >140*  --  88*  --   --   --   --   --  58*  CREATININE 19.30* >18.00*  --  12.57*  --   --   --   --   --  9.72*  CALCIUM 9.3  --   --  9.1  --   --   --   --   --  8.7*  PHOS  --   --   --  7.1*  --   --   --   --   --  4.5  < > = values in this interval not displayed. Liver Function Tests:  Recent Labs Lab 04/19/16 1657 04/20/16 0428 04/22/16 0840  AST 25  --   --   ALT 26  --   --   ALKPHOS 91  --   --   BILITOT 1.4*  --   --   PROT 7.6  --   --   ALBUMIN 3.5 3.0* 2.7*   CBC:  Recent Labs Lab 04/19/16 1657 04/19/16 1715 04/20/16 0427 04/22/16 0802  WBC 3.9*  --  3.9* 5.2  NEUTROABS 2.9  --   --   --   HGB 12.2 14.3 11.5* 9.9*  HCT 36.9 42.0 35.0* 31.9*  MCV 96.1  --  95.6 98.8  PLT 147*  --  126* 99*   CBG:  Recent Labs Lab 04/20/16 0257 04/20/16 0412 04/20/16 0804 04/21/16 0748 04/22/16 0745  GLUCAP 63* 110* 85 133* 128*  Recent Results (from the past 240 hour(s))  MRSA PCR Screening     Status:  None   Collection Time: 04/19/16  8:33 PM  Result Value Ref Range Status   MRSA by PCR NEGATIVE NEGATIVE Final    Comment:        The GeneXpert MRSA Assay (FDA approved for NASAL specimens only), is one component of a comprehensive MRSA colonization surveillance program. It is not intended to diagnose MRSA infection nor to guide or monitor treatment for MRSA infections.   Culture, blood (routine x 2)     Status: None (Preliminary result)   Collection Time: 04/19/16  9:14 PM  Result Value Ref Range Status   Specimen Description BLOOD LEFT HAND  Final   Special Requests BOTTLES DRAWN AEROBIC ONLY 4CC  Final   Culture NO GROWTH 2 DAYS  Final   Report Status PENDING  Incomplete  Culture, blood (routine x 2)     Status: None (Preliminary result)   Collection Time: 04/19/16 11:21 PM  Result Value Ref Range Status   Specimen Description BLOOD LEFT ARM  Final   Special Requests BOTTLES DRAWN AEROBIC AND ANAEROBIC 5CC  Final   Culture NO GROWTH 2 DAYS  Final   Report Status PENDING  Incomplete     Studies: No results found.  Scheduled Meds: . amLODipine  10 mg Oral QHS  . Darbepoetin Alfa      . darbepoetin (ARANESP) injection - DIALYSIS  100 mcg Intravenous Q Thu-HD  . feeding supplement (NEPRO CARB STEADY)  237 mL Oral TID BM  . hydrALAZINE  25 mg Oral Q8H  . sevelamer carbonate  800 mg Oral TID WC  . sodium chloride flush  3 mL Intravenous Q12H  . sodium chloride flush  3 mL Intravenous Q12H   Continuous Infusions: . dextrose 1,000 mL (04/20/16 0316)    Principal Problem:   Uremia Active Problems:   Essential hypertension   Thrombocytopenia (HCC)   Hyperkalemia   Failure to thrive in adult   Hypoglycemia   ESRD needing dialysis (HCC)   Leukopenia   Hypothermia   Uremia of renal origin   Dehydration   Palliative care encounter   DNR (do not resuscitate)    Time spent: 30 minutes    [facility] Clinics AuthorityBROL,Deigo Alonso  [facility] Pager [phone]. If 7PM-7AM, please  contact night-coverage at [email or web address], password [email or web address] 04/22/2016, 10:50 AM  LOS: 3 days

## 2016-04-22 NOTE — Progress Notes (Signed)
Daily Progress Note   Patient Name: Natalie Wolfe       Date: 04/22/2016 DOB: 13-Nov-1929  Age: 82 y.o. MRN#: [id number] Attending Physician: Reyne Dumas, MD Primary Care Physician: Jeanmarie Hubert, MD Admit Date: 04/19/2016  Reason for Consultation/Follow-up: Establishing goals of care  Subjective: Patient in dialysis this AM. Met Natalie Wolfe, cousin, in the room. Once again, Natalie Wolfe talked about her unsafe living conditions at home. Natalie Wolfe agrees that the best place for her is a skilled nursing facility where they can manage her care, medications, and dialysis. Re-informed Natalie Wolfe that Ms. Dhillon spoke yesterday of not wanting to be resuscitated or on life support and he understands she is a DNR. He is interested in filling out HCPOA paperwork for her.   Length of Stay: 3  Current Medications: Scheduled Meds:  . amLODipine  10 mg Oral QHS  . Darbepoetin Alfa      . darbepoetin (ARANESP) injection - DIALYSIS  100 mcg Intravenous Q Thu-HD  . feeding supplement (NEPRO CARB STEADY)  237 mL Oral TID BM  . hydrALAZINE  25 mg Oral Q8H  . sevelamer carbonate  800 mg Oral TID WC  . sodium chloride flush  3 mL Intravenous Q12H  . sodium chloride flush  3 mL Intravenous Q12H    Continuous Infusions: . dextrose 1,000 mL (04/20/16 0316)    PRN Meds: sodium chloride, sodium chloride, sodium chloride, acetaminophen **OR** acetaminophen, lidocaine (PF), lidocaine-prilocaine, ondansetron **OR** ondansetron (ZOFRAN) IV, pentafluoroprop-tetrafluoroeth, polyethylene glycol, sodium chloride flush  Physical Exam  Nursing note and vitals reviewed. Patient in HD.   Vital Signs: BP (!) 129/58   Pulse 71   Temp 97.7 F (36.5 C) (Oral)   Resp 20   Ht _0  (1.6 m)   Wt 41.9 kg (92 lb 6 oz)   SpO2 99%    BMI 16.36 kg/m  SpO2: SpO2: 99 % O2 Device: O2 Device: Not Delivered O2 Flow Rate:    Intake/output summary:  Intake/Output Summary (Last 24 hours) at 04/22/16 1240 Last data filed at 04/22/16 0531  Gross per 24 hour  Intake              240 ml  Output                0 ml  Net  240 ml   LBM: Last BM Date: 04/17/16 Baseline Weight: Weight: 43.5 kg (95 lb 14.4 oz) Most recent weight: Weight: 41.9 kg (92 lb 6 oz)       Palliative Assessment/Data: PPS 40%   Flowsheet Rows   Flowsheet Row Most Recent Value  Intake Tab  Referral Department  Hospitalist  Unit at Time of Referral  Intermediate Care Unit  Palliative Care Primary Diagnosis  Nephrology  Date Notified  04/20/16  Palliative Care Type  New Palliative care  Reason for referral  Clarify Goals of Care  Date of Admission  04/19/16  # of days IP prior to Palliative referral  1  Clinical Assessment  Palliative Performance Scale Score  40%  Psychosocial & Spiritual Assessment  Palliative Care Outcomes  Patient/Family meeting held?  Yes  Who was at the meeting?  patient (spoke with cousin, Natalie Wolfe, via telephone)  Palliative Care Outcomes  Clarified goals of care, Changed CPR status, Completed durable DNR      Patient Active Problem List   Diagnosis Date Noted  . Palliative care encounter   . DNR (do not resuscitate)   . ESRD needing dialysis ([place]) 04/19/2016  . Leukopenia 04/19/2016  . Uremia 04/19/2016  . Hypothermia 04/19/2016  . Uremia of renal origin 04/19/2016  . Dehydration   . Hypoglycemia 03/12/2015  . Failure to thrive in adult 03/09/2015  . Memory loss 02/10/2015  . Depression 02/05/2015  . Hyperkalemia 01/31/2015  . Pulmonary edema 12/23/2014  . Protein-calorie malnutrition, severe ([place]) 12/23/2014  . Anorexia 10/23/2014  . Normal coronary arteries 09/16/2014 09/17/2014  . Congestive dilated cardiomyopathy-EF 40-45% at cath, 35% by echo   . Troponin level elevated-0.2 09/13/2014  .  Syncope 09/12/2014  . ESRD (end stage renal disease) ([place]) 09/12/2014  . Thrombocytopenia ([place]) 09/12/2014  . Cardiopulmonary arrest ([place]) 09/12/2014  . SOB (shortness of breath)   . Overactive bladder 01/14/2013  . Unspecified vitamin D deficiency 01/14/2013  . Anemia of chronic disease 01/14/2013  . Arthritis of knee 01/14/2013  . Essential hypertension 11/27/2012  . Hyperlipemia 11/27/2012    Palliative Care Assessment & Plan   Patient Profile: 82 y.o. female  with past medical history of ESRD on hemodialysis, dementia, failure to thrive, CHF, DM, and anemia admitted on 04/19/2016 with weakness. Patient brought in by EMS and had not had dialysis since 04/06/16. Diagnosed with uremia and hyperkalemia. Nephrology consulted. Leukopenia upon admission with unclear etiology. Hypertensive on admission most likely due to volume-excess. Patient received HD upon admission. Palliative Medicine consultation for goals of care and advance directives.   Assessment: ESRD on hemodialysis Failure to thrive Hypoglycemia Leukopenia Hypertension  Recommendations/Plan:  DNR/DNI per patient and family.  Provided and educated Natalie Wolfe on [employer]. Will follow up 9/15.  Patient wishes to continue dialysis.   Disposition: most likely SNF with palliative services.   Goals of Care and Additional Recommendations:  Limitations on Scope of Treatment: Full Scope Treatment-except DNR/DNI  Code Status:  DNR   Code Status Orders        Start     Ordered   04/21/16 0942  Do not attempt resuscitation (DNR)  Continuous    Question Answer Comment  In the event of cardiac or respiratory ARREST Do not call a "code blue"   In the event of cardiac or respiratory ARREST Do not perform Intubation, CPR, defibrillation or ACLS   In the event of cardiac or respiratory ARREST Use medication by any route, position, wound care, and other  measures to relive pain and suffering. May use oxygen, suction and manual  treatment of airway obstruction as needed for comfort.      04/21/16 0941    Code Status History    Date Active Date Inactive Code Status Order ID Comments User Context   04/19/2016  8:02 PM 04/21/2016  9:41 AM Full Code [id number]  Vianne Bulls, MD ED   04/16/2015  8:09 PM 04/18/2015  2:41 PM Full Code [id number]  Toy Baker, MD ED   03/12/2015  7:52 PM 03/15/2015  8:00 PM Full Code [id number]  Domenic Polite, MD Inpatient   12/21/2014  9:20 PM 12/24/2014  8:48 PM Full Code [id number]  Lavina Hamman, MD Inpatient   09/17/2014 10:00 AM 09/18/2014  7:39 PM Full Code [id number]  Troy Sine, MD Inpatient   09/12/2014  4:37 PM 09/17/2014 10:00 AM Full Code [id number]  Verlee Monte, MD Inpatient   08/31/2014 10:52 AM 09/09/2014  4:45 PM Full Code [id number]  Geradine Girt, DO Inpatient       Prognosis:   < 3 months-in the setting of ESRD, FTT, and declining nutrition and functional status.   Discharge Planning:  [facility] for rehab with Palliative care service follow-up  Care plan was discussed with Toma Deiters   Thank you for allowing the Palliative Medicine Team to assist in the care of this patient.   Time In: 1030 Time Out: 1055 Total Time 58mn Prolonged Time Billed  no       Greater than 50%  of this time was spent counseling and coordinating care related to the above assessment and plan.  MIhor Dow FNP-C Palliative Medicine Team  Phone: [phone]: [phone] Please contact Palliative Medicine Team phone at [phone] questions and concerns.

## 2016-04-22 NOTE — Consult Note (Addendum)
[facility] Face-to-Face Psychiatry Consult   Reason for Consult: Capacity determination Referring Physician: Dr. Allyson Sabal  Patient Identification: Natalie Wolfe MRN:  [id number] Principal Diagnosis: Uremia Diagnosis:   Patient Active Problem List   Diagnosis Date Noted  . Goals of care, counseling/discussion [Z71.89]   . Palliative care encounter [Z51.5]   . DNR (do not resuscitate) [Z66]   . ESRD needing dialysis ([place]) [N18.6] 04/19/2016  . Leukopenia [D72.819] 04/19/2016  . Uremia [N19] 04/19/2016  . Hypothermia [T68.XXXA] 04/19/2016  . Uremia of renal origin [N19] 04/19/2016  . Dehydration [E86.0]   . Hypoglycemia [E16.2] 03/12/2015  . Failure to thrive in adult [R62.7] 03/09/2015  . Memory loss [R41.3] 02/10/2015  . Depression [F32.9] 02/05/2015  . Hyperkalemia [E87.5] 01/31/2015  . Pulmonary edema [J81.1] 12/23/2014  . Protein-calorie malnutrition, severe ([place]) [E43] 12/23/2014  . Anorexia [R63.0] 10/23/2014  . Normal coronary arteries 09/16/2014 [Z03.89] 09/17/2014  . Congestive dilated cardiomyopathy-EF 40-45% at cath, 35% by echo [I42.0]   . Troponin level elevated-0.2 [R79.89] 09/13/2014  . Syncope [R55] 09/12/2014  . ESRD (end stage renal disease) ([place]) [N18.6] 09/12/2014  . Thrombocytopenia ([place]) [D69.6] 09/12/2014  . Cardiopulmonary arrest ([place]) [I46.9] 09/12/2014  . SOB (shortness of breath) [R06.02]   . Overactive bladder [N32.81] 01/14/2013  . Unspecified vitamin D deficiency [E55.9] 01/14/2013  . Anemia of chronic disease [D63.8] 01/14/2013  . Arthritis of knee [M12.9] 01/14/2013  . Essential hypertension [I10] 11/27/2012  . Hyperlipemia [E78.5] 11/27/2012    Total Time spent with patient: 20 minutes  Subjective:   Natalie Wolfe is a 82 y.o. female patient admitted with uremia.  HPI: Patient is an 82 year old female, who has history of dementia, ESRD on hemodialysis. She presented to ED o 9/11 due to marked uremia and hyperkalemia, in the context of  missing her hemodialysis for a period of about two weeks . Reportedly patient had also been non compliant with medications .  I have discussed case with Dr. Allyson Sabal and have reviewed chart. As per chart notes, patient has remained confused, disoriented . Treatment team is working on disposition options, SNF placement is being considered . At this time patient is awake, pleasant on approach . She denies depression ,denies feeling sad,  and denies any suicidal ideations . She acknowledges missing hemodialysis, states " I was just tired, did not want to go ". She denies any suicidal or self injurious intentions in skipping hemodialysis treatments . She presents confused, partially oriented. States she is aware she is in a hospital, in [place], but cannot identify name or floor , or reason for admission. She is disoriented to time , states " it is July,and I do not know the year ". Patient does not express concern about missing dialysis, and does not express understanding of the dangers associated with being non compliant with this treatment , such as worsening uremia, confusion, risk of death. Patient states she is hoping to go home when she is discharged .      Past Psychiatric History: denies psychiatric history , states she had episode of depression when her mother passed away years ago, but denies any current depression, denies history of suicide attempts, denies history of psychosis. Chart notes indicate history of dementia.  Risk to Self: Is patient at risk for suicide?: No Risk to Others:   Prior Inpatient Therapy:   Prior Outpatient Therapy:    Past Medical History:  Past Medical History:  Diagnosis Date  . Anemia, unspecified   . Cataract   .  CHF (congestive heart failure) ([place])   . Chronic kidney disease, stage II (mild)    [facility]  . Dementia    forgetful, .  . Failure to thrive in adult 03/09/2015  . Insomnia, unspecified   . Memory loss 02/10/2015  .  Nocturia   . Obesity, unspecified   . Other and unspecified hyperlipidemia   . Other anxiety states   . Other atopic dermatitis and related conditions   . Pain in joint, lower leg   . PVD (peripheral vascular disease) ([place])   . Rash and other nonspecific skin eruption   . Routine general medical examination at a health care facility   . Thrombocytopenia ([place])   . Type II or unspecified type diabetes mellitus with renal manifestations, not stated as uncontrolled   . Unspecified essential hypertension   . Unspecified vitamin D deficiency   . Urinary frequency     Past Surgical History:  Procedure Laterality Date  . ABDOMINAL HYSTERECTOMY  1970  . AV FISTULA PLACEMENT Left 09/06/2014   Procedure: CREATION OF LEFT UPPER ARM ARTERIOVENOUS (AV) FISTULA ;  Surgeon: Mal Misty, MD;  Location: [facility];  Service: Vascular;  Laterality: Left;  . BREAST SURGERY  1979   nodule removed  . EYE SURGERY  2004   catarcts removed from right eye.  Marland Kitchen FALSE ANEURYSM REPAIR Left 03/07/2015   Procedure: REPAIR PSEUDOANEURYSMS OF LEFT ARM ARTERIOVENOUS FISTULA;  Surgeon: Angelia Mould, MD;  Location: [facility];  Service: Vascular;  Laterality: Left;  . INSERTION OF DIALYSIS CATHETER N/A 09/06/2014   Procedure: INSERTION OF DIALYSIS CATHETER RIGHT INTERNAL JUGULAR VEIN;  Surgeon: Mal Misty, MD;  Location: [facility];  Service: Vascular;  Laterality: N/A;  . LEFT HEART CATHETERIZATION WITH CORONARY ANGIOGRAM N/A 09/17/2014   Procedure: LEFT HEART CATHETERIZATION WITH CORONARY ANGIOGRAM;  Surgeon: Troy Sine, MD;  Location: [facility] CATH LAB;  Service: Cardiovascular;  Laterality: N/A;  . LIGATION OF COMPETING BRANCHES OF ARTERIOVENOUS FISTULA Left 09/06/2014   Procedure: LIGATION OF COMPETING BRANCHES OF ARTERIOVENOUS FISTULA;  Surgeon: Mal Misty, MD;  Location: [facility];  Service: Vascular;  Laterality: Left;  Marland Kitchen MULTIPLE TOOTH EXTRACTIONS  09/2013   Remonve remaining 3 teeth, no with dentures    Family  History:  Family History  Problem Relation Age of Onset  . Diabetes Mother    Family Psychiatric  History: non contributory  Social History:  History  Alcohol Use No     History  Drug Use No    Social History   Social History  . Marital status: Widowed    Spouse name: N/A  . Number of children: N/A  . Years of education: N/A   Social History Main Topics  . Smoking status: Former Smoker    Types: Cigarettes    Quit date: 08/10/2000  . Smokeless tobacco: Never Used  . Alcohol use No  . Drug use: No  . Sexual activity: No   Other Topics Concern  . None   Social History Narrative  . None   Additional Social History:    Allergies:   Allergies  Allergen Reactions  . Heparin Other (See Comments)    Positive Hep Induced Plt Ab and SRA 09/12/2014 and 09/17/2014 (2/9 mis-reported as negative by lab). Pt rec'd SQ heparin and heparin w/ HD prior to + results.    Labs:  Results for orders placed or performed during the hospital encounter of 04/19/16 (from the past 48 hour(s))  Potassium     Status: Abnormal   Collection Time: 04/20/16  4:40 PM  Result Value Ref Range   Potassium 3.4 (L) 3.5 - 5.1 mmol/L    Comment: NO VISIBLE HEMOLYSIS DELTA CHECK NOTED   Vitamin B12     Status: Abnormal   Collection Time: 04/20/16  4:40 PM  Result Value Ref Range   Vitamin B-12 2,168 (H) 180 - 914 pg/mL    Comment: (NOTE) This assay is not validated for testing neonatal or myeloproliferative syndrome specimens for Vitamin B12 levels.   TSH     Status: Abnormal   Collection Time: 04/20/16  4:40 PM  Result Value Ref Range   TSH 5.363 (H) 0.350 - 4.500 uIU/mL  RPR     Status: None   Collection Time: 04/20/16  4:40 PM  Result Value Ref Range   RPR Ser Ql Non Reactive Non Reactive    Comment: (NOTE) Performed At: [facility] [place], [place] [id number] Lindon Romp MD DX:[id number]   Potassium     Status: Abnormal   Collection Time: 04/20/16  8:43 PM   Result Value Ref Range   Potassium 3.2 (L) 3.5 - 5.1 mmol/L  Potassium     Status: Abnormal   Collection Time: 04/21/16 12:00 AM  Result Value Ref Range   Potassium 3.4 (L) 3.5 - 5.1 mmol/L  Potassium     Status: None   Collection Time: 04/21/16  4:19 AM  Result Value Ref Range   Potassium 3.7 3.5 - 5.1 mmol/L  Glucose, capillary     Status: Abnormal   Collection Time: 04/21/16  7:48 AM  Result Value Ref Range   Glucose-Capillary 133 (H) 65 - 99 mg/dL   Comment 1 Notify RN    Comment 2 Document in Chart   Potassium     Status: None   Collection Time: 04/21/16  9:35 AM  Result Value Ref Range   Potassium 3.7 3.5 - 5.1 mmol/L  T4, free     Status: Abnormal   Collection Time: 04/21/16  9:35 AM  Result Value Ref Range   Free T4 1.13 (H) 0.61 - 1.12 ng/dL    Comment: (NOTE) Biotin ingestion may interfere with free T4 tests. If the results are inconsistent with the TSH level, previous test results, or the clinical presentation, then consider biotin interference. If needed, order repeat testing after stopping biotin.   Potassium     Status: Abnormal   Collection Time: 04/21/16 12:32 PM  Result Value Ref Range   Potassium 3.4 (L) 3.5 - 5.1 mmol/L  Potassium     Status: Abnormal   Collection Time: 04/21/16  4:30 PM  Result Value Ref Range   Potassium 3.4 (L) 3.5 - 5.1 mmol/L  Potassium     Status: None   Collection Time: 04/21/16  7:50 PM  Result Value Ref Range   Potassium 3.9 3.5 - 5.1 mmol/L  Potassium     Status: None   Collection Time: 04/22/16 12:29 AM  Result Value Ref Range   Potassium 3.6 3.5 - 5.1 mmol/L  Glucose, capillary     Status: Abnormal   Collection Time: 04/22/16  7:45 AM  Result Value Ref Range   Glucose-Capillary 128 (H) 65 - 99 mg/dL  CBC     Status: Abnormal   Collection Time: 04/22/16  8:02 AM  Result Value Ref Range   WBC 5.2 4.0 - 10.5 K/uL   RBC 3.23 (L) 3.87 - 5.11 MIL/uL  Hemoglobin 9.9 (L) 12.0 - 15.0 g/dL   HCT 31.9 (L) 36.0 - 46.0 %    MCV 98.8 78.0 - 100.0 fL   MCH 30.7 26.0 - 34.0 pg   MCHC 31.0 30.0 - 36.0 g/dL   RDW 15.8 (H) 11.5 - 15.5 %   Platelets 99 (L) 150 - 400 K/uL    Comment: PLATELET COUNT CONFIRMED BY SMEAR  Renal function panel     Status: Abnormal   Collection Time: 04/22/16  8:40 AM  Result Value Ref Range   Sodium 132 (L) 135 - 145 mmol/L   Potassium 3.8 3.5 - 5.1 mmol/L   Chloride 95 (L) 101 - 111 mmol/L   CO2 25 22 - 32 mmol/L   Glucose, Bld 115 (H) 65 - 99 mg/dL   BUN 58 (H) 6 - 20 mg/dL   Creatinine, Ser 9.72 (H) 0.44 - 1.00 mg/dL   Calcium 8.7 (L) 8.9 - 10.3 mg/dL   Phosphorus 4.5 2.5 - 4.6 mg/dL   Albumin 2.7 (L) 3.5 - 5.0 g/dL   GFR calc non Af Amer 3 (L) >60 mL/min   GFR calc Af Amer 4 (L) >60 mL/min    Comment: (NOTE) The eGFR has been calculated using the CKD EPI equation. This calculation has not been validated in all clinical situations. eGFR's persistently <60 mL/min signify possible Chronic Kidney Disease.    Anion gap 12 5 - 15  Iron and TIBC     Status: Abnormal   Collection Time: 04/22/16  8:47 AM  Result Value Ref Range   Iron 20 (L) 28 - 170 ug/dL   TIBC 157 (L) 250 - 450 ug/dL   Saturation Ratios 13 10.4 - 31.8 %   UIBC 137 ug/dL    Current Facility-Administered Medications  Medication Dose Route Frequency Provider Last Rate Last Dose  . 0.9 %  sodium chloride infusion  250 mL Intravenous PRN Vianne Bulls, MD      . acetaminophen (TYLENOL) tablet 650 mg  650 mg Oral Q6H PRN Vianne Bulls, MD       Or  . acetaminophen (TYLENOL) suppository 650 mg  650 mg Rectal Q6H PRN Vianne Bulls, MD      . amLODipine (NORVASC) tablet 10 mg  10 mg Oral QHS Vianne Bulls, MD   10 mg at 04/21/16 2242  . Darbepoetin Alfa (ARANESP) 100 MCG/0.5ML injection           . Darbepoetin Alfa (ARANESP) injection 100 mcg  100 mcg Intravenous Q Thu-HD Valentina Gu, NP   100 mcg at 04/22/16 1024  . dextrose 10 % infusion   Intravenous Continuous Rhetta Mura Schorr, NP 20 mL/hr at  04/20/16 0316 1,000 mL at 04/20/16 0316  . feeding supplement (NEPRO CARB STEADY) liquid 237 mL  237 mL Oral TID BM Reyne Dumas, MD   237 mL at 04/21/16 2245  . hydrALAZINE (APRESOLINE) tablet 25 mg  25 mg Oral Q8H Vianne Bulls, MD   25 mg at 04/21/16 2242  . ondansetron (ZOFRAN) tablet 4 mg  4 mg Oral Q6H PRN Vianne Bulls, MD       Or  . ondansetron (ZOFRAN) injection 4 mg  4 mg Intravenous Q6H PRN Ilene Qua Opyd, MD      . polyethylene glycol (MIRALAX / GLYCOLAX) packet 17 g  17 g Oral Daily PRN Vianne Bulls, MD      . sevelamer carbonate (RENVELA) tablet 800 mg  800 mg  Oral TID WC Corliss Parish, MD   800 mg at 04/21/16 1735  . sodium chloride flush (NS) 0.9 % injection 3 mL  3 mL Intravenous Q12H Ilene Qua Opyd, MD   3 mL at 04/22/16 1520  . sodium chloride flush (NS) 0.9 % injection 3 mL  3 mL Intravenous Q12H Vianne Bulls, MD   3 mL at 04/20/16 2205  . sodium chloride flush (NS) 0.9 % injection 3 mL  3 mL Intravenous PRN Vianne Bulls, MD        Musculoskeletal: Strength & Muscle Tone: within normal limits Gait & Station: patient in bed, gait not examined  Patient leans: N/A  Psychiatric Specialty Exam: Physical Exam  ROS does not endorse chest pain or shortness of breath   Blood pressure 137/60, pulse 75, temperature 97.9 F (36.6 C), temperature source Oral, resp. rate 20, height 5' 3"  (1.6 m), weight 87 lb 1.3 oz (39.5 kg), SpO2 99 %.Body mass index is 15.43 kg/m.  General Appearance: Fairly Groomed  Eye Contact:  Fair  Speech:  Slow  Volume:  Decreased  Mood:  denies feeling depressed   Affect:  slightly constricted, but does smile at times appropriately   Thought Process:  Slow, generally linear   Orientation:  Other:  oriented to self ,partial orientation to place, not to time.  Thought Content:   Denies hallucinations, no delusions expressed   Suicidal Thoughts:  No- denies any suicidal or self injurious ideations   Homicidal Thoughts:  No  Memory:   recent and remote fair   Judgement:  Impaired  Insight:  limited   Psychomotor Activity:  Normal- no agitation or restlessness at this time   Concentration:  Concentration: Fair and Attention Span: Fair  Recall:  [employer] of Knowledge:  Fair  Language:  Good  Akathisia:  Negative  Handed:  Right  AIMS (if indicated):     Assets:  Desire for Improvement Resilience  ADL's:  Impaired  Cognition:  Impaired,  Moderate  Sleep:      Assessment - at this time patient is alert, generally attentive, presents confused, disoriented. She cannot identify the reason she is in the hospital, although she does acknowledge having missed hemodialysis appointments, because of feeling tired . She does not currently demonstrate insight or awareness of dangers associated with missing hemodialysis, and presents unconcerned about having missed sessions earlier . She does not present with awareness of potential safety challenges to return to live independently at this time . States simply " I will be fine". She does deny any significant depression at this time, and denies any suicidal ideations or that missing treatments was with suicidal intent.   Treatment Plan Summary: as below   Disposition: No evidence of imminent risk to self or others at present.   Patient does not meet criteria for psychiatric inpatient admission. At this time patient lacks capacity to make determinations regarding disposition planning /placement  Recommend contacting next of kin in order to help with disposition planning . Neita Garnet, MD 04/22/2016 3:52 PM

## 2016-04-23 LAB — CBC
HEMATOCRIT: 36.3 % (ref 36.0–46.0)
Hemoglobin: 11 g/dL — ABNORMAL LOW (ref 12.0–15.0)
MCH: 31.2 pg (ref 26.0–34.0)
MCHC: 30.3 g/dL (ref 30.0–36.0)
MCV: 102.8 fL — AB (ref 78.0–100.0)
PLATELETS: 91 10*3/uL — AB (ref 150–400)
RBC: 3.53 MIL/uL — AB (ref 3.87–5.11)
RDW: 16 % — ABNORMAL HIGH (ref 11.5–15.5)
WBC: 5.2 10*3/uL (ref 4.0–10.5)

## 2016-04-23 LAB — POTASSIUM
Potassium: 3.9 mmol/L (ref 3.5–5.1)
Potassium: 4 mmol/L (ref 3.5–5.1)
Potassium: 4.3 mmol/L (ref 3.5–5.1)
Potassium: 3.9 mmol/L (ref 3.5–5.1)

## 2016-04-23 LAB — COMPREHENSIVE METABOLIC PANEL
ALBUMIN: 2.9 g/dL — AB (ref 3.5–5.0)
ALT: 18 U/L (ref 14–54)
AST: 24 U/L (ref 15–41)
Alkaline Phosphatase: 82 U/L (ref 38–126)
Anion gap: 10 (ref 5–15)
BILIRUBIN TOTAL: 1 mg/dL (ref 0.3–1.2)
BUN: 29 mg/dL — AB (ref 6–20)
CHLORIDE: 96 mmol/L — AB (ref 101–111)
CO2: 27 mmol/L (ref 22–32)
CREATININE: 5.79 mg/dL — AB (ref 0.44–1.00)
Calcium: 8.9 mg/dL (ref 8.9–10.3)
GFR calc Af Amer: 7 mL/min — ABNORMAL LOW (ref >60)
GFR, EST NON AFRICAN AMERICAN: 6 mL/min — AB (ref >60)
GLUCOSE: 119 mg/dL — AB (ref 65–99)
POTASSIUM: 4 mmol/L (ref 3.5–5.1)
Sodium: 133 mmol/L — ABNORMAL LOW (ref 135–145)
Total Protein: 7.1 g/dL (ref 6.5–8.1)

## 2016-04-23 LAB — GLUCOSE, CAPILLARY: GLUCOSE-CAPILLARY: 104 mg/dL — AB (ref 65–99)

## 2016-04-23 LAB — FOLATE RBC
Folate, RBC: >1818 ng/mL (ref >498)
HEMATOCRIT: 34.1 % (ref 34.0–46.6)

## 2016-04-23 MED ORDER — POLYETHYLENE GLYCOL 3350 17 G PO PACK: 17.0000 g | PACK | Freq: Every day | ORAL | 0 refills | Status: AC | PRN

## 2016-04-23 MED ORDER — NEPRO/CARBSTEADY PO LIQD: 237.0000 mL | Freq: Three times a day (TID) | ORAL | 2 refills | Status: AC

## 2016-04-23 MED ORDER — RENA-VITE PO TABS: 1.0000 | ORAL_TABLET | Freq: Every day | ORAL | Status: DC

## 2016-04-23 MED ORDER — SEVELAMER CARBONATE 800 MG PO TABS: 800.0000 mg | ORAL_TABLET | Freq: Three times a day (TID) | ORAL | 0 refills | Status: DC

## 2016-04-23 MED ORDER — NA FERRIC GLUC CPLX IN SUCROSE 12.5 MG/ML IV SOLN: 125.0000 mg | INTRAVENOUS | Status: DC

## 2016-04-23 NOTE — Clinical Social Work Placement (Signed)
   CLINICAL SOCIAL WORK PLACEMENT  NOTE  Date:  04/23/2016  Patient Details  Name: Natalie Wolfe MRN: [id number] Date of Birth: [birth year]  Clinical Social Work is seeking post-discharge placement for this patient at the Skilled  Nursing Facility level of care (*CSW will initial, date and re-position this form in  chart as items are completed):      Patient/family provided with [facility] Clinical Social Work Department's list of facilities offering this level of care within the geographic area requested by the patient (or if unable, by the patient's family).      Patient/family informed of their freedom to choose among providers that offer the needed level of care, that participate in Medicare, Medicaid or managed care program needed by the patient, have an available bed and are willing to accept the patient.      Patient/family informed of [facility]'s ownership interest in [facility] and [facility], as well as of the fact that they are under no obligation to receive care at these facilities.  PASRR submitted to EDS on 04/20/16     PASRR number received on 04/20/16     Existing PASRR number confirmed on       FL2 transmitted to all facilities in geographic area requested by pt/family on 04/20/16     FL2 transmitted to all facilities within larger geographic area on       Patient informed that his/her managed care company has contracts with or will negotiate with certain facilities, including the following:        Yes   Patient/family informed of bed offers received.  [facility]     Physician recommends and patient chooses bed at      [facility].  Patient to be transferred to facility by [facility]     Patient family notified on 04/23/16 of transfer.  Name of family member notified:  relative, Greggory StallionGeorge     PHYSICIAN Please sign FL2, Please sign DNR     Additional Comment:     _______________________________________________ Rondel BatonIngle, [facility] C, LCSW 04/23/2016, 11:18 AM

## 2016-04-23 NOTE — Clinical Social Work Note (Signed)
Patient will discharge today per MD order. Patient will discharge to: [facility] RN to call report prior to transportation to: [phone] Transportation:[facility] to be called at 1pm  CSW sent discharge summary to SNF for review.    Vickii PennaGina Nicholis Stepanek, MSW, LCSW  [phone]  Licensed Clinical Social Worker

## 2016-04-23 NOTE — Progress Notes (Signed)
Natalie ReaderGeorgianna Wolfe to be D/C'd Nursing Home per MD order.  Discussed prescriptions and follow up appointments with the patient. Pt verbalized understanding.    Vitals:   04/23/16 0747 04/23/16 1321  BP: 140/63 (!) 183/67  Pulse: 80   Resp: 18   Temp: 98.4 F (36.9 C)       [facility].   Jonell CluckKadeesha Shaan Rhoads RN [facility] Phone [phone]

## 2016-04-23 NOTE — Discharge Summary (Addendum)
Physician Discharge Summary  Natalie Wolfe MRN: [id number] DOB/AGE: 02-10-1930 82 y.o.  PCP: Jeanmarie Hubert, MD   Admit date: 04/19/2016 Discharge date: 04/23/2016  Discharge Diagnoses:    Principal Problem:   Uremia Active Problems:   Essential hypertension   Thrombocytopenia (HCC)   Hyperkalemia   Failure to thrive in adult   Hypoglycemia   ESRD needing dialysis ([place])   Leukopenia   Hypothermia   Uremia of renal origin   Dehydration   Palliative care encounter   DNR (do not resuscitate)   Goals of care, counseling/discussion    Follow-up recommendations Follow-up with PCP in 3-5 days , including all  additional recommended appointments as below Follow-up CBC, CMP in 3-5 days Patient is a DO NOT RESUSCITATE. As per psychiatric evaluation patient lacks capacity to make medical decisions/living arrangement Monitor platelet count closely, trending down-avoid heparin products    Current Discharge Medication List    START taking these medications   Details  !! Nutritional Supplements (FEEDING SUPPLEMENT, NEPRO CARB STEADY,) LIQD Take 237 mLs by mouth 3 (three) times daily between meals. Qty: 30 Can, Refills: 2    polyethylene glycol (MIRALAX / GLYCOLAX) packet Take 17 g by mouth daily as needed for mild constipation. Qty: 14 each, Refills: 0    sevelamer carbonate (RENVELA) 800 MG tablet Take 1 tablet (800 mg total) by mouth 3 (three) times daily with meals. Qty: 120 tablet, Refills: 0     !! - Potential duplicate medications found. Please discuss with provider.    CONTINUE these medications which have NOT CHANGED   Details  amLODipine (NORVASC) 10 MG tablet Take 10 mg by mouth at bedtime.    carvedilol (COREG) 12.5 MG tablet One twice daily to strengthen the heart and control BP Qty: 60 tablet, Refills: 5   Associated Diagnoses: Congestive dilated cardiomyopathy (HCC)    docusate sodium (COLACE) 100 MG capsule Take 1 capsule (100 mg total) by mouth daily.  To prevent constipation Qty: 30 capsule, Refills: 0    !! ENSURE (ENSURE) Take 237 mLs by mouth 3 (three) times daily between meals.    ferrous sulfate 325 (65 FE) MG tablet Take 1 tablet (325 mg total) by mouth daily. Qty: 30 tablet, Refills: 0    fluocinonide cream (LIDEX) 5.63 % Apply 1 application topically 2 (two) times daily. Apply from neck down twice a day (provided by dermatology).    hydrALAZINE (APRESOLINE) 25 MG tablet Take 1 tablet (25 mg total) by mouth every 8 (eight) hours. Qty: 270 tablet, Refills: 3   Associated Diagnoses: Essential hypertension    hydrocortisone 2.5 % lotion Apply 1 application topically daily. Apply to face    lidocaine-prilocaine (EMLA) cream Apply 1 application topically daily as needed (prior to dialysis).     multivitamin (RENA-VIT) TABS tablet Take 1 tablet by mouth at bedtime. Qty: 30 each, Refills: 0    !! Nutritional Supplements (FEEDING SUPPLEMENT, NEPRO CARB STEADY,) LIQD Take 237 mLs by mouth as needed (missed meal during dialysis.). Qty: 28 Can, Refills: 0    sertraline (ZOLOFT) 50 MG tablet Take 1 tablet (50 mg total) by mouth daily. Patient must schedule and keep appointment for before any more refills. Qty: 30 tablet, Refills: 0     !! - Potential duplicate medications found. Please discuss with provider.           Discharge Condition: Stable  Discharge Instructions Get Medicines reviewed and adjusted: Please take all your medications with you for your next visit with  your Primary MD  Please request your Primary MD to go over all hospital tests and procedure/radiological results at the follow up, please ask your Primary MD to get all Hospital records sent to his/her office.  If you experience worsening of your admission symptoms, develop shortness of breath, life threatening emergency, suicidal or homicidal thoughts you must seek medical attention immediately by calling 911 or calling your MD immediately if symptoms less  severe.  You must read complete instructions/literature along with all the possible adverse reactions/side effects for all the Medicines you take and that have been prescribed to you. Take any new Medicines after you have completely understood and accpet all the possible adverse reactions/side effects.   Do not drive when taking Pain medications.   Do not take more than prescribed Pain, Sleep and Anxiety Medications  Special Instructions: If you have smoked or chewed Tobacco in the last 2 yrs please stop smoking, stop any regular Alcohol and or any Recreational drug use.  Wear Seat belts while driving.  Please note  You were cared for by a hospitalist during your hospital stay. Once you are discharged, your primary care physician will handle any further medical issues. Please note that NO REFILLS for any discharge medications will be authorized once you are discharged, as it is imperative that you return to your primary care physician (or establish a relationship with a primary care physician if you do not have one) for your aftercare needs so that they can reassess your need for medications and monitor your lab values.     Allergies  Allergen Reactions  . Heparin Other (See Comments)    Positive Hep Induced Plt Ab and SRA 09/12/2014 and 09/17/2014 (2/9 mis-reported as negative by lab). Pt rec'd SQ heparin and heparin w/ HD prior to + results.      Disposition: SNF   Consults:  Nephrology Psychiatry    Significant Diagnostic Studies:  Dg Chest 2 View  Result Date: 04/19/2016 CLINICAL DATA:  Missed dialysis for the past 2 weeks. Chronic kidney disease. Diabetes. EXAM: CHEST  2 VIEW COMPARISON:  04/16/2015. FINDINGS: Mildly progressive enlargement of the cardiac silhouette. Mildly increased prominence of the pulmonary vasculature and interstitial markings. Small bilateral pleural effusions. Diffuse osteopenia. Left axillary and upper arm stent. IMPRESSION: Mild changes of  congestive heart failure. Electronically Signed   By: Claudie Revering M.D.   On: 04/19/2016 17:39   Ct Head Wo Contrast  Result Date: 04/19/2016 CLINICAL DATA:  82 year old diabetic female on dialysis. Weakness. No dialysis for the past 2 weeks. Initial encounter. EXAM: CT HEAD WITHOUT CONTRAST CT CERVICAL SPINE WITHOUT CONTRAST TECHNIQUE: Multidetector CT imaging of the head and cervical spine was performed following the standard protocol without intravenous contrast. Multiplanar CT image reconstructions of the cervical spine were also generated. COMPARISON:  03/12/2015 CT head and cervical spine. FINDINGS: CT HEAD FINDINGS Brain: No intracranial hemorrhage or CT evidence of large acute infarct. Remote right cerebellar infarct.  Chronic microvascular changes. Global atrophy without hydrocephalus. No intracranial mass lesion noted on this unenhanced exam. Vascular: Vascular calcifications. Skull: No worrisome abnormality. Sinuses/Orbits: Minimal mucosal thickening sphenoid sinus air cells bilaterally. Other: Negative. CT CERVICAL SPINE FINDINGS Alignment: No cervical spine fracture or malalignment. Degenerative changes cervical spine similar to prior exam without destruction to suggest acute infection. Cervical spondylotic changes contribute to various degrees of spinal stenosis/ foraminal narrowing most notable on the left at the C5-6 level. No worrisome neck mass.  Carotid bifurcation calcifications. No worrisome upper lung  mass. IMPRESSION: CT HEAD No intracranial hemorrhage or CT evidence of large acute infarct. Remote right cerebellar infarct. Chronic microvascular changes. Global atrophy without hydrocephalus. Minimal mucosal thickening sphenoid sinus air cells bilaterally. CT CERVICAL SPINE No cervical spine fracture or malalignment. Degenerative changes cervical spine similar to prior exam. Electronically Signed   By: Genia Del M.D.   On: 04/19/2016 17:35   Ct Cervical Spine Wo Contrast  Result  Date: 04/19/2016 CLINICAL DATA:  82 year old diabetic female on dialysis. Weakness. No dialysis for the past 2 weeks. Initial encounter. EXAM: CT HEAD WITHOUT CONTRAST CT CERVICAL SPINE WITHOUT CONTRAST TECHNIQUE: Multidetector CT imaging of the head and cervical spine was performed following the standard protocol without intravenous contrast. Multiplanar CT image reconstructions of the cervical spine were also generated. COMPARISON:  03/12/2015 CT head and cervical spine. FINDINGS: CT HEAD FINDINGS Brain: No intracranial hemorrhage or CT evidence of large acute infarct. Remote right cerebellar infarct.  Chronic microvascular changes. Global atrophy without hydrocephalus. No intracranial mass lesion noted on this unenhanced exam. Vascular: Vascular calcifications. Skull: No worrisome abnormality. Sinuses/Orbits: Minimal mucosal thickening sphenoid sinus air cells bilaterally. Other: Negative. CT CERVICAL SPINE FINDINGS Alignment: No cervical spine fracture or malalignment. Degenerative changes cervical spine similar to prior exam without destruction to suggest acute infection. Cervical spondylotic changes contribute to various degrees of spinal stenosis/ foraminal narrowing most notable on the left at the C5-6 level. No worrisome neck mass.  Carotid bifurcation calcifications. No worrisome upper lung mass. IMPRESSION: CT HEAD No intracranial hemorrhage or CT evidence of large acute infarct. Remote right cerebellar infarct. Chronic microvascular changes. Global atrophy without hydrocephalus. Minimal mucosal thickening sphenoid sinus air cells bilaterally. CT CERVICAL SPINE No cervical spine fracture or malalignment. Degenerative changes cervical spine similar to prior exam. Electronically Signed   By: Genia Del M.D.   On: 04/19/2016 17:35        Filed Weights   04/22/16 0815 04/22/16 1226 04/22/16 2001  Weight: 41.9 kg (92 lb 6 oz) 39.5 kg (87 lb 1.3 oz) 39 kg (85 lb 15.7 oz)     Microbiology: Recent  Results (from the past 240 hour(s))  MRSA PCR Screening     Status: None   Collection Time: 04/19/16  8:33 PM  Result Value Ref Range Status   MRSA by PCR NEGATIVE NEGATIVE Final    Comment:        The GeneXpert MRSA Assay (FDA approved for NASAL specimens only), is one component of a comprehensive MRSA colonization surveillance program. It is not intended to diagnose MRSA infection nor to guide or monitor treatment for MRSA infections.   Culture, blood (routine x 2)     Status: None (Preliminary result)   Collection Time: 04/19/16  9:14 PM  Result Value Ref Range Status   Specimen Description BLOOD LEFT HAND  Final   Special Requests BOTTLES DRAWN AEROBIC ONLY 4CC  Final   Culture NO GROWTH 3 DAYS  Final   Report Status PENDING  Incomplete  Culture, blood (routine x 2)     Status: None (Preliminary result)   Collection Time: 04/19/16 11:21 PM  Result Value Ref Range Status   Specimen Description BLOOD LEFT ARM  Final   Special Requests BOTTLES DRAWN AEROBIC AND ANAEROBIC 5CC  Final   Culture NO GROWTH 3 DAYS  Final   Report Status PENDING  Incomplete       Blood Culture    Component Value Date/Time   SDES BLOOD LEFT ARM 04/19/2016 2321  SPECREQUEST BOTTLES DRAWN AEROBIC AND ANAEROBIC 5CC 04/19/2016 2321   CULT NO GROWTH 3 DAYS 04/19/2016 2321   REPTSTATUS PENDING 04/19/2016 2321      Labs: Results for orders placed or performed during the hospital encounter of 04/19/16 (from the past 48 hour(s))  Potassium     Status: None   Collection Time: 04/21/16  9:35 AM  Result Value Ref Range   Potassium 3.7 3.5 - 5.1 mmol/L  T4, free     Status: Abnormal   Collection Time: 04/21/16  9:35 AM  Result Value Ref Range   Free T4 1.13 (H) 0.61 - 1.12 ng/dL    Comment: (NOTE) Biotin ingestion may interfere with free T4 tests. If the results are inconsistent with the TSH level, previous test results, or the clinical presentation, then consider biotin interference. If  needed, order repeat testing after stopping biotin.   Potassium     Status: Abnormal   Collection Time: 04/21/16 12:32 PM  Result Value Ref Range   Potassium 3.4 (L) 3.5 - 5.1 mmol/L  Potassium     Status: Abnormal   Collection Time: 04/21/16  4:30 PM  Result Value Ref Range   Potassium 3.4 (L) 3.5 - 5.1 mmol/L  Potassium     Status: None   Collection Time: 04/21/16  7:50 PM  Result Value Ref Range   Potassium 3.9 3.5 - 5.1 mmol/L  Potassium     Status: None   Collection Time: 04/22/16 12:29 AM  Result Value Ref Range   Potassium 3.6 3.5 - 5.1 mmol/L  Glucose, capillary     Status: Abnormal   Collection Time: 04/22/16  7:45 AM  Result Value Ref Range   Glucose-Capillary 128 (H) 65 - 99 mg/dL  CBC     Status: Abnormal   Collection Time: 04/22/16  8:02 AM  Result Value Ref Range   WBC 5.2 4.0 - 10.5 K/uL   RBC 3.23 (L) 3.87 - 5.11 MIL/uL   Hemoglobin 9.9 (L) 12.0 - 15.0 g/dL   HCT 31.9 (L) 36.0 - 46.0 %   MCV 98.8 78.0 - 100.0 fL   MCH 30.7 26.0 - 34.0 pg   MCHC 31.0 30.0 - 36.0 g/dL   RDW 15.8 (H) 11.5 - 15.5 %   Platelets 99 (L) 150 - 400 K/uL    Comment: PLATELET COUNT CONFIRMED BY SMEAR  Renal function panel     Status: Abnormal   Collection Time: 04/22/16  8:40 AM  Result Value Ref Range   Sodium 132 (L) 135 - 145 mmol/L   Potassium 3.8 3.5 - 5.1 mmol/L   Chloride 95 (L) 101 - 111 mmol/L   CO2 25 22 - 32 mmol/L   Glucose, Bld 115 (H) 65 - 99 mg/dL   BUN 58 (H) 6 - 20 mg/dL   Creatinine, Ser 9.72 (H) 0.44 - 1.00 mg/dL   Calcium 8.7 (L) 8.9 - 10.3 mg/dL   Phosphorus 4.5 2.5 - 4.6 mg/dL   Albumin 2.7 (L) 3.5 - 5.0 g/dL   GFR calc non Af Amer 3 (L) >60 mL/min   GFR calc Af Amer 4 (L) >60 mL/min    Comment: (NOTE) The eGFR has been calculated using the CKD EPI equation. This calculation has not been validated in all clinical situations. eGFR's persistently <60 mL/min signify possible Chronic Kidney Disease.    Anion gap 12 5 - 15  Iron and TIBC     Status:  Abnormal   Collection Time: 04/22/16  8:47 AM  Result Value Ref Range   Iron 20 (L) 28 - 170 ug/dL   TIBC 157 (L) 250 - 450 ug/dL   Saturation Ratios 13 10.4 - 31.8 %   UIBC 137 ug/dL  Potassium     Status: None   Collection Time: 04/22/16  2:57 PM  Result Value Ref Range   Potassium 3.9 3.5 - 5.1 mmol/L  Potassium     Status: None   Collection Time: 04/22/16  6:49 PM  Result Value Ref Range   Potassium 3.8 3.5 - 5.1 mmol/L  Potassium     Status: None   Collection Time: 04/23/16 12:34 AM  Result Value Ref Range   Potassium 3.9 3.5 - 5.1 mmol/L  Potassium     Status: None   Collection Time: 04/23/16  3:55 AM  Result Value Ref Range   Potassium 4.0 3.5 - 5.1 mmol/L  Potassium     Status: None   Collection Time: 04/23/16  7:39 AM  Result Value Ref Range   Potassium 4.3 3.5 - 5.1 mmol/L  Glucose, capillary     Status: Abnormal   Collection Time: 04/23/16  7:46 AM  Result Value Ref Range   Glucose-Capillary 104 (H) 65 - 99 mg/dL     Lipid Panel     Component Value Date/Time   CHOL 160 03/10/2015 0841   TRIG 87 03/10/2015 0841   HDL 39 (L) 03/10/2015 0841   CHOLHDL 4.1 03/10/2015 0841   LDLCALC 104 (H) 03/10/2015 0841     Lab Results  Component Value Date   HGBA1C 5.2 04/17/2015   HGBA1C 5.8 (H) 03/12/2015   HGBA1C 5.7 (H) 03/10/2015     Lab Results  Component Value Date   LDLCALC 104 (H) 03/10/2015   CREATININE 9.72 (H) 04/22/2016     HPI :  82 y.o.femalewith medical history significant fordementia, end-stage renal disease on hemodialysis, and hypertension who presents the emergency department with EMS after missing her dialysis for close to 2 weeks. Patient lives alone and has a cousin who cares for her, but reports that the patient often does not let him into the building. She is reportedly eaten very little in recent weeks, there is suspicion that she has not taken her medications, and she has not been to dialysis since 04/06/2016. The dialysis center is  reportedly been calling the patient encouraging her to come in, but she reports being too weak. EMS was activated today in order to get the patient to the emergency department for dialysis. She denies any complaints. Specifically, she denies chest pain, palpitations, cough, or dyspnea. Patient denies any abdominal pain, nausea, vomiting, or diarrhea. She denies depression or anxiety and denies suicidal ideations or hallucinations  HOSPITAL COURSE: *  1. ESRD with uremia and hyperkalemia  - Patient had gone 12 days without HD, last session 04/06/16  - Potassium 5.9 on admission without EKG changes, BUN 171, no resp distress, mild vascular congestion appreciated on CXR  - Nephrology consulted and performed three days of HD consecutively to be back on schedule and to repair volume/electrolytes impairment slowly  . Patient disoriented, has not been able to get herself to HD or care for herself living alone Palliative care consulted, patient is now a DO NOT RESUSCITATE but wishes to continue dialysis, this will be best accomplished if she is permanently placed in a SNF  2. Hypoglycemia  - Likely d/t renal failure and poor nutrition - Treated with dextrose in ED, continue frequent CBG's  - Encourage oral  intake  CBG now stable   3. Leukopenia, thrombocytopenia: unclear etiology - WBC 3,900 on admission, previously normal  - Sepsis considered, but lactate reassuring and no potential source identified  - Suspect leukopenia may be d/t poor nutrition and uremia Vitamin B-12 within normal limits, TSH is slightly elevated, free T4 within normal limits - Platelet count 147K>99K on admission; vitamin B-12 normal, RBC folate still pending - No s/s of active blood-loss , will monitor , may need hematology eval in the outpatient setting if wishes to pursue full medical care has not been given any lovenox or heparin  4. Hypertension  - BP elevated on admission; volume-excess likely contributing  - Now  improved - Continue Norvasc and Coreg - will adjust medications as needed   5. Adult failure to thrive - Pt living alone, barely eating per family, with intermittent falls and missed HD for 12 days straight.  - Pt denies depression, had previously been on Zoloft before, but she stopped taking it - She reports a desire to continue HD treatment and is in agreement with rehab if needed   Palliative medicine contacted Iona Beard, cousin, in the room. Once again, Iona Beard talked about her unsafe living conditions at home. Iona Beard agrees that the best place for her is a skilled nursing facility where they can manage her care, medications, and dialysis. He is aware that Ms. Penninger is not wanting to be resuscitated or on life support and he understands she is a DNR.   RPR negative Psychiatric consultation to determine capacity was requested. Patient does not have capacity to make determinations regarding her health/placement. Patient needs a POA to make decisions for her. Appreciate psychiatric input   Code Status: DNR   Discharge Exam:   Blood pressure 140/63, pulse 80, temperature 98.4 F (36.9 C), temperature source Oral, resp. rate 18, height 5' 3"  (1.6 m), weight 39 kg (85 lb 15.7 oz), SpO2 99 %.   General:  Afebrile, no CP, oriented X2 (person and place),  Underweight, frail in no acute distress. Patient is having HD.  Cardiovascular: S1 and S2, no rubs or gallops  Respiratory:good air movement, no wheezing   Abdomen: soft, NT,ND, positive BS  Musculoskeletal: no edema, no cyanosis     Follow-up Information    Jeanmarie Hubert, MD. Schedule an appointment as soon as possible for a visit in 2 day(s).   Specialty:  Internal Medicine Why:  Hospital follow-up Contact information: [address] [place] [address] [phone]           Signed: Reyne Dumas 04/23/2016, 8:39 AM        Time spent >45 mins

## 2016-04-23 NOTE — Clinical Social Work Note (Signed)
Clinical Social Work Assessment  Patient Details  Name: Natalie Wolfe MRN: [id number] Date of Birth: [birth year]  Date of referral:  04/23/16               Reason for consult:  Facility Placement                Permission sought to share information with:  Family Supports, Magazine features editoracility Contact Representative, Case Estate manager/land agentManager Permission granted to share information::  Yes, Release of Information Signed  Name::      Greggory Stallion([name], nephew)  Agency::   [facility])  Relationship::   (nephew)  Contact Information:     Housing/Transportation Living arrangements for the past 2 months:  Single Family Home Source of Information:  Other (Comment Required) (nephew Greggory StallionGeorge) Patient Interpreter Needed:  None Criminal Activity/Legal Involvement Pertinent to Current Situation/Hospitalization:    Significant Relationships:  Other Family Members Lives with:  Self Do you feel safe going back to the place where you live?  No Need for family participation in patient care:  Yes (Comment) (dementia)  Care giving concerns:  Nephew [facility]   Social Worker assessment / plan:  Patient has dementia.  CSW spoke with patient's nephew, Greggory StallionGeorge who is her main caregiver.  Greggory StallionGeorge states he has [facility] and would like for the [facility] when she is dc'd.   [name] was able to make a bed offer and the patient, facility and family are all agreeable to dc plans.  Dc today.  PTAR requested.  SNF wishes for PTAR to be called at 1pm.  RN aware and agreeable.  Employment status:  Retired Health and safety inspectornsurance information:  [employer] PT Recommendations:  Skilled Nursing Facility Information / Referral to community resources:  Skilled Nursing Facility  Patient/Family's Response to care:  Agreeable to SNF.  Patient/Family's Understanding of and Emotional Response to Diagnosis, Current Treatment, and Prognosis:  Nephew was very relieved that [name] was able to  accept the patient.  He expressed appreciation for all help he/the patient has received since the patient has been here.  Emotional Assessment Appearance:  Appears stated age Attitude/Demeanor/Rapport:    Affect (typically observed):  Accepting Orientation:  Oriented to Self, Oriented to Place Alcohol / Substance use:  Not Applicable Psych involvement (Current and /or in the community):  No (Comment)  Discharge Needs  Concerns to be addressed:  No discharge needs identified Readmission within the last 30 days:  No Current discharge risk:  None Barriers to Discharge:  No Barriers Identified   Rondel Batonngle, Jonta Gastineau C, LCSW 04/23/2016, 11:23 AM

## 2016-04-23 NOTE — Care Management Important Message (Signed)
Important Message  Patient Details  Name: Natalie Wolfe MRN: [id number] Date of Birth: September 06, 1929   Medicare Important Message Given:  Yes    Natalie Wolfe 04/23/2016, 12:59 PM

## 2016-04-23 NOTE — Progress Notes (Signed)
Report given to [facility] at [facility].   Jonell CluckKadeesha Jaelyn Cloninger, RN, [facility] [address] Phone [phone]

## 2016-04-23 NOTE — Progress Notes (Signed)
Physical Therapy Treatment Patient Details Name: Natalie ReaderGeorgianna Recker MRN: [id number] DOB: April 09, 1930 Today's Date: 04/23/2016    History of Present Illness 82 y.o. female with medical history significant for dementia, end-stage renal disease on hemodialysis, and hypertension who presents the emergency department with EMS after missing her dialysis for close to 2 weeks. Patient lives alone and has a cousin who cares for her, but reports that the patient often does not let him into the building. She is reportedly eaten very little in recent weeks, there is suspicion that she has not taken her medications, and she has not been to dialysis since 04/06/2016.    PT Comments    Pt has progressed to being able to assist with standing and transfers to chair with RW, no signs of buckling with knees.  Her plan continues to be SNF and will anticipate her progression to more standing stability will get her home faster.  Until then, will follow her acutely with PT for lower body strengthening and control of standing.    Follow Up Recommendations  SNF;Supervision/Assistance - 24 hour     Equipment Recommendations  None recommended by PT    Recommendations for Other Services OT consult     Precautions / Restrictions Precautions Precautions: Fall Restrictions Weight Bearing Restrictions: No    Mobility  Bed Mobility Overal bed mobility: Needs Assistance Bed Mobility: Supine to Sit     Supine to sit: Min guard        Transfers Overall transfer level: Needs assistance Equipment used: Rolling walker (2 wheeled) Transfers: Sit to/from [employer]Stand;Stand Pivot Transfers Sit to Stand: Mod assist Stand pivot transfers: Min assist;Mod assist       General transfer comment: reminded about hand placemetn and control of posture, extension of knees  Ambulation/Gait Ambulation/Gait assistance: Min assist Ambulation Distance (Feet): 5 Feet Assistive device: Rolling walker (2 wheeled);1 person hand  held assist Gait Pattern/deviations: Step-to pattern;Step-through pattern;Trunk flexed;Wide base of support;Shuffle;Decreased stride length Gait velocity: redcued Gait velocity interpretation: Below normal speed for age/gender General Gait Details: Pt uses walker but needs steady cues for transition including to reach back to sit   Stairs            Wheelchair Mobility    Modified Rankin (Stroke Patients Only)       Balance Overall balance assessment: Needs assistance Sitting-balance support: Feet supported Sitting balance-Leahy Scale: Fair     Standing balance support: Bilateral upper extremity supported Standing balance-Leahy Scale: Poor                      Cognition Arousal/Alertness: Awake/alert Behavior During Therapy: Flat affect Overall Cognitive Status: No family/caregiver present to determine baseline cognitive functioning Area of Impairment: Following commands;Safety/judgement;Awareness       Following Commands: Follows one step commands consistently Safety/Judgement: Decreased awareness of deficits;Decreased awareness of safety Awareness: Intellectual Problem Solving: Slow processing General Comments: Talking about her family coming to get her later today.    Exercises      General Comments        Pertinent Vitals/Pain Pain Assessment: No/denies pain    Home Living                      Prior Function            PT Goals (current goals can now be found in the care plan section) Acute Rehab PT Goals Patient Stated Goal: go home alone Progress towards PT goals: Progressing toward goals  Frequency   PT Diagnosis  Min 2X/week   ESRD needing dialysis (HCC)  Generalized weakness  Hypoglycemia  Dehydration     PT Plan Current plan remains appropriate    Co-evaluation             End of Session Equipment Utilized During Treatment: Gait belt Activity Tolerance: Patient tolerated treatment well Patient  left: in chair;with call bell/phone within reach;with chair alarm set     Time: 1206-1224 PT Time Calculation (min) (ACUTE ONLY): 18 min  Charges:  $Gait Training: 8-22 mins                    G Codes:      Ivar Drape 05/14/2016, 1:38 PM    Samul Dada, PT MS Acute Rehab Dept. Number: [facility] [id number] and [facility] [phone]

## 2016-04-23 NOTE — Care Management Important Message (Signed)
Important Message  Patient Details  Name: Natalie ReaderGeorgianna Wolfe MRN: [id number] Date of Birth: 05-27-1930   Medicare Important Message Given:  Yes    Earnestine Shipp 04/23/2016, 1:11 PM

## 2016-04-23 NOTE — Progress Notes (Signed)
[facility] Progress Note   Dialysis Orders: TTS [facility] 4 hours 300/A1.5 EDW 42 kg 2K 2.25 Ca bath Mircera 200 mcg Q2weeks (last dosed 8/29) No heparin  Assessment/Plan: 1. Non-adherence to HD/FTT-pleasantly confused - lacks capacity per psychiatry- she will need SNF placement or discontinue dialysis - palliative care working with family;  If she continues continues to refuse to come to outpatient dialysis while living in an SNF, we will to pursue stopping dialysis 2. Hypoglycemia: Resolving. Likely secondary to inadequate intake 3. ESRD- normally TTS at [facility]- missed for 13 days- Had HD 09/11 and 09/12. Now back on schedule. Next HD Saturday.if not d/c today K 4.3 9/15 3. Anemia-hgb 11.59/13 to 9.9 on 9/14 - received mircera on 8/29- had aranesp 100 mcg IV with HD 9/14. 4. Secondary hyperparathyroidism-iPTH 111. P 4.5 no VDRA 5. HTN/volume-BP is up some  on amlodipine/hydralazine here- serial dialysis for volume lowering.  Net UF 1.9 Thursday with post weight 39.5- continue to titrate down 6. Hypoglycemia and hypoalbuminemia- BS improving, albumin 3.0. Renal diet, Nepro.  7. DNR 9. Drop in platelets - 147 to 126 to 99 - repeat Sat if still here 10. PCM - Needs nepro 3-4 x [er day at [facility] after discharge as supplements are her primary pro and kcal source  Sheffield Slider, PA-C [facility] Beeper [phone] 04/23/2016,9:03 AM  LOS: 4 days   Subjective:   No c/o - wants to continue on HD - "I have not choice" - I assured her she has a choice but she doesn't want to die. Only drank Nepro for breakfast - primary only drinks supplments as opposed to solid foods  Objective Vitals:   04/22/16 1805 04/22/16 2001 04/23/16 0446 04/23/16 0747  BP: (!) 125/59 (!) 145/61 (!) 168/62 140/63  Pulse: 79 75 80 80  Resp: 18 18 17 18   Temp: 98 F (36.7 C) 98.5 F (36.9 C) 98.1 F (36.7 C) 98.4 F (36.9 C)  TempSrc: Oral Oral  Oral  SpO2: 96% 99% 99% 99%   Weight:  39 kg (85 lb 15.7 oz)    Height:       Physical Exam General: NAD alert, emaciated - 2016 Hospital, ? Name, cannot name the president but someone new - when I suggested Trump - she said "mean Trump" Heart: RRR Lungs: no rales Abdomen: soft NT Extremities: no LE edema Dialysis Access: left upper AVF + bruit   Additional Objective Labs: Basic Metabolic Panel:  Recent Labs Lab 04/19/16 1657 04/19/16 1715  04/20/16 0428  04/22/16 0840  04/23/16 0034 04/23/16 0355 04/23/16 0739  NA 129* 126*  --  132*  --  132*  --   --   --   --   K 5.9* 5.6*  < > 3.8  < > 3.8  < > 3.9 4.0 4.3  CL 85* 89*  --  91*  --  95*  --   --   --   --   CO2 17*  --   --  21*  --  25  --   --   --   --   GLUCOSE 59* 55*  --  104*  --  115*  --   --   --   --   BUN 171* >140*  --  88*  --  58*  --   --   --   --   CREATININE 19.30* >18.00*  --  12.57*  --  9.72*  --   --   --   --  CALCIUM 9.3  --   --  9.1  --  8.7*  --   --   --   --   PHOS  --   --   --  7.1*  --  4.5  --   --   --   --   < > = values in this interval not displayed. Liver Function Tests:  Recent Labs Lab 04/19/16 1657 04/20/16 0428 04/22/16 0840  AST 25  --   --   ALT 26  --   --   ALKPHOS 91  --   --   BILITOT 1.4*  --   --   PROT 7.6  --   --   ALBUMIN 3.5 3.0* 2.7*   No results for input(s): LIPASE, AMYLASE in the last 168 hours. CBC:  Recent Labs Lab 04/19/16 1657 04/19/16 1715 04/20/16 0427 04/22/16 0802  WBC 3.9*  --  3.9* 5.2  NEUTROABS 2.9  --   --   --   HGB 12.2 14.3 11.5* 9.9*  HCT 36.9 42.0 35.0* 31.9*  MCV 96.1  --  95.6 98.8  PLT 147*  --  126* 99*   Blood Culture    Component Value Date/Time   SDES BLOOD LEFT ARM 04/19/2016 2321   SPECREQUEST BOTTLES DRAWN AEROBIC AND ANAEROBIC 5CC 04/19/2016 2321   CULT NO GROWTH 3 DAYS 04/19/2016 2321   REPTSTATUS PENDING 04/19/2016 2321    Cardiac Enzymes: No results for input(s): CKTOTAL, CKMB, CKMBINDEX, TROPONINI in the last 168  hours. CBG:  Recent Labs Lab 04/20/16 0412 04/20/16 0804 04/21/16 0748 04/22/16 0745 04/23/16 0746  GLUCAP 110* 85 133* 128* 104*   Iron Studies:  Recent Labs  04/22/16 0847  IRON 20*  TIBC 157*   Lab Results  Component Value Date   INR 1.22 04/19/2016   INR 1.05 09/17/2014   INR 1.15 09/12/2014   Studies/Results: No results found. Medications: . dextrose 1,000 mL (04/20/16 0316)   . amLODipine  10 mg Oral QHS  . darbepoetin (ARANESP) injection - DIALYSIS  100 mcg Intravenous Q Thu-HD  . feeding supplement (NEPRO CARB STEADY)  237 mL Oral TID BM  . hydrALAZINE  25 mg Oral Q8H  . sevelamer carbonate  800 mg Oral TID WC  . sodium chloride flush  3 mL Intravenous Q12H  . sodium chloride flush  3 mL Intravenous Q12H

## 2016-04-24 LAB — CULTURE, BLOOD (ROUTINE X 2)
CULTURE: NO GROWTH
Culture: NO GROWTH

## 2016-04-28 ENCOUNTER — Telehealth: Payer: Self-pay

## 2016-04-28 NOTE — Telephone Encounter (Signed)
[facility] from [facility].   Left message on voicemail for patient to return call when available, reason for call to confirm that patient requested equipment and to inform her that if she did Dr.Green is out of office and will return in October.

## 2016-05-03 ENCOUNTER — Encounter: Payer: Self-pay | Admitting: Internal Medicine

## 2016-05-03 ENCOUNTER — Encounter: Payer: Medicare Other | Admitting: Internal Medicine

## 2016-05-12 ENCOUNTER — Encounter (HOSPITAL_COMMUNITY): Payer: Self-pay

## 2016-05-12 ENCOUNTER — Emergency Department (HOSPITAL_COMMUNITY): Payer: Medicare Other

## 2016-05-12 ENCOUNTER — Emergency Department (HOSPITAL_COMMUNITY)
Admission: EM | Admit: 2016-05-12 | Discharge: 2016-05-12 | Disposition: A | Discharge status: Home / Self Care | Payer: Medicare Other | Attending: Emergency Medicine | Admitting: Emergency Medicine

## 2016-05-12 DIAGNOSIS — S0990XA Unspecified injury of head, initial encounter: Secondary | ICD-10-CM | POA: Insufficient documentation

## 2016-05-12 DIAGNOSIS — Y9301 Activity, walking, marching and hiking: Secondary | ICD-10-CM | POA: Diagnosis not present

## 2016-05-12 DIAGNOSIS — N186 End stage renal disease: Secondary | ICD-10-CM | POA: Diagnosis not present

## 2016-05-12 DIAGNOSIS — Y92129 Unspecified place in nursing home as the place of occurrence of the external cause: Secondary | ICD-10-CM | POA: Diagnosis not present

## 2016-05-12 DIAGNOSIS — M25551 Pain in right hip: Secondary | ICD-10-CM | POA: Insufficient documentation

## 2016-05-12 DIAGNOSIS — Z87891 Personal history of nicotine dependence: Secondary | ICD-10-CM | POA: Insufficient documentation

## 2016-05-12 DIAGNOSIS — Y999 Unspecified external cause status: Secondary | ICD-10-CM | POA: Diagnosis not present

## 2016-05-12 DIAGNOSIS — E1122 Type 2 diabetes mellitus with diabetic chronic kidney disease: Secondary | ICD-10-CM | POA: Diagnosis not present

## 2016-05-12 DIAGNOSIS — Z992 Dependence on renal dialysis: Secondary | ICD-10-CM | POA: Diagnosis not present

## 2016-05-12 DIAGNOSIS — I509 Heart failure, unspecified: Secondary | ICD-10-CM | POA: Diagnosis not present

## 2016-05-12 DIAGNOSIS — S0181XA Laceration without foreign body of other part of head, initial encounter: Secondary | ICD-10-CM

## 2016-05-12 DIAGNOSIS — I132 Hypertensive heart and chronic kidney disease with heart failure and with stage 5 chronic kidney disease, or end stage renal disease: Secondary | ICD-10-CM | POA: Diagnosis not present

## 2016-05-12 DIAGNOSIS — W19XXXA Unspecified fall, initial encounter: Secondary | ICD-10-CM

## 2016-05-12 DIAGNOSIS — W228XXA Striking against or struck by other objects, initial encounter: Secondary | ICD-10-CM | POA: Insufficient documentation

## 2016-05-12 DIAGNOSIS — S161XXA Strain of muscle, fascia and tendon at neck level, initial encounter: Secondary | ICD-10-CM

## 2016-05-12 DIAGNOSIS — S7001XA Contusion of right hip, initial encounter: Secondary | ICD-10-CM

## 2016-05-12 LAB — CBC WITH DIFFERENTIAL/PLATELET
Basophils Absolute: 0 10*3/uL (ref 0.0–0.1)
Basophils Relative: 0 %
EOS ABS: 0.1 10*3/uL (ref 0.0–0.7)
EOS PCT: 1 %
HCT: 30.5 % — ABNORMAL LOW (ref 36.0–46.0)
HEMOGLOBIN: 9.4 g/dL — AB (ref 12.0–15.0)
LYMPHS ABS: 0.7 10*3/uL (ref 0.7–4.0)
LYMPHS PCT: 11 %
MCH: 30.6 pg (ref 26.0–34.0)
MCHC: 30.8 g/dL (ref 30.0–36.0)
MCV: 99.3 fL (ref 78.0–100.0)
MONOS PCT: 8 %
Monocytes Absolute: 0.5 10*3/uL (ref 0.1–1.0)
NEUTROS PCT: 80 %
Neutro Abs: 5 10*3/uL (ref 1.7–7.7)
Platelets: 182 10*3/uL (ref 150–400)
RBC: 3.07 MIL/uL — ABNORMAL LOW (ref 3.87–5.11)
RDW: 16.8 % — ABNORMAL HIGH (ref 11.5–15.5)
WBC: 6.2 10*3/uL (ref 4.0–10.5)

## 2016-05-12 LAB — BASIC METABOLIC PANEL
ANION GAP: 14 (ref 5–15)
BUN: 43 mg/dL — ABNORMAL HIGH (ref 6–20)
CHLORIDE: 91 mmol/L — AB (ref 101–111)
CO2: 29 mmol/L (ref 22–32)
Calcium: 8.4 mg/dL — ABNORMAL LOW (ref 8.9–10.3)
Creatinine, Ser: 4.08 mg/dL — ABNORMAL HIGH (ref 0.44–1.00)
GFR calc non Af Amer: 9 mL/min — ABNORMAL LOW (ref >60)
GFR, EST AFRICAN AMERICAN: 11 mL/min — AB (ref >60)
GLUCOSE: 181 mg/dL — AB (ref 65–99)
POTASSIUM: 3.6 mmol/L (ref 3.5–5.1)
Sodium: 134 mmol/L — ABNORMAL LOW (ref 135–145)

## 2016-05-12 MED ORDER — MORPHINE SULFATE (PF) 2 MG/ML IV SOLN
2.0000 mg | Freq: Once | INTRAVENOUS | Status: AC
  Administered 2016-05-12: 2 mg via INTRAVENOUS
  Filled 2016-05-12: qty 1

## 2016-05-12 MED ORDER — LIDOCAINE HCL (PF) 1 % IJ SOLN
5.0000 mL | Freq: Once | INTRAMUSCULAR | Status: AC
  Administered 2016-05-12: 5 mL via INTRADERMAL
  Filled 2016-05-12: qty 5

## 2016-05-12 NOTE — ED Provider Notes (Signed)
[facility]-EMERGENCY DEPT Provider Note   CSN: [id number] Arrival date & time: 05/12/16  0941     History   Chief Complaint Chief Complaint  Patient presents with  . Fall    HPI Natalie Wolfe is a 82 y.o. female.  Patient is an 82 year old female with past medical history of CHF, hypertension, and dementia. She was brought by EMS after a fall. She is a resident of [facility] skilled nursing facility. She reports today attempting to get into a wheelchair, than losing her balance and falling sideways. She reports pain in her hip and sustained a laceration to her right upper forehead. She denies any loss of consciousness or neck pain. She denies any headache or visual changes.   The history is provided by the patient.  Fall  This is a new problem. The current episode started less than 1 hour ago. The problem occurs constantly. The problem has not changed since onset.Pertinent negatives include no chest pain, no headaches and no shortness of breath. Associated symptoms comments: Right hip pain. Exacerbated by: Movement and palpation. Nothing relieves the symptoms.    Past Medical History:  Diagnosis Date  . Anemia, unspecified   . Cataract   . CHF (congestive heart failure) (HCC)   . Chronic kidney disease, stage II (mild)    [facility]  . Dementia    forgetful, .  . Failure to thrive in adult 03/09/2015  . Insomnia, unspecified   . Memory loss 02/10/2015  . Nocturia   . Obesity, unspecified   . Other and unspecified hyperlipidemia   . Other anxiety states   . Other atopic dermatitis and related conditions   . Pain in joint, lower leg   . PVD (peripheral vascular disease) (HCC)   . Rash and other nonspecific skin eruption   . Routine general medical examination at a health care facility   . Thrombocytopenia (HCC)   . Type II or unspecified type diabetes mellitus with renal manifestations, not stated as uncontrolled(250.40)   . Unspecified essential  hypertension   . Unspecified vitamin D deficiency   . Urinary frequency     Patient Active Problem List   Diagnosis Date Noted  . Goals of care, counseling/discussion   . Palliative care encounter   . DNR (do not resuscitate)   . ESRD needing dialysis (HCC) 04/19/2016  . Leukopenia 04/19/2016  . Uremia 04/19/2016  . Hypothermia 04/19/2016  . Uremia of renal origin 04/19/2016  . Dehydration   . Hypoglycemia 03/12/2015  . Failure to thrive in adult 03/09/2015  . Memory loss 02/10/2015  . Depression 02/05/2015  . Hyperkalemia 01/31/2015  . Pulmonary edema 12/23/2014  . Protein-calorie malnutrition, severe (HCC) 12/23/2014  . Anorexia 10/23/2014  . Normal coronary arteries 09/16/2014 09/17/2014  . Congestive dilated cardiomyopathy-EF 40-45% at cath, 35% by echo   . Troponin level elevated-0.2 09/13/2014  . Syncope 09/12/2014  . ESRD (end stage renal disease) (HCC) 09/12/2014  . Thrombocytopenia (HCC) 09/12/2014  . Cardiopulmonary arrest (HCC) 09/12/2014  . SOB (shortness of breath)   . Overactive bladder 01/14/2013  . Unspecified vitamin D deficiency 01/14/2013  . Anemia of chronic disease 01/14/2013  . Arthritis of knee 01/14/2013  . Essential hypertension 11/27/2012  . Hyperlipemia 11/27/2012    Past Surgical History:  Procedure Laterality Date  . ABDOMINAL HYSTERECTOMY  1970  . AV FISTULA PLACEMENT Left 09/06/2014   Procedure: CREATION OF LEFT UPPER ARM ARTERIOVENOUS (AV) FISTULA ;  Surgeon: Pryor Ochoa, MD;  Location:  [facility] OR;  Service: Vascular;  Laterality: Left;  . BREAST SURGERY  1979   nodule removed  . EYE SURGERY  2004   catarcts removed from right eye.  Marland Kitchen FALSE ANEURYSM REPAIR Left 03/07/2015   Procedure: REPAIR PSEUDOANEURYSMS OF LEFT ARM ARTERIOVENOUS FISTULA;  Surgeon: Chuck Hint, MD;  Location: [facility] OR;  Service: Vascular;  Laterality: Left;  . INSERTION OF DIALYSIS CATHETER N/A 09/06/2014   Procedure: INSERTION OF DIALYSIS CATHETER RIGHT INTERNAL  JUGULAR VEIN;  Surgeon: Pryor Ochoa, MD;  Location: [facility] OR;  Service: Vascular;  Laterality: N/A;  . LEFT HEART CATHETERIZATION WITH CORONARY ANGIOGRAM N/A 09/17/2014   Procedure: LEFT HEART CATHETERIZATION WITH CORONARY ANGIOGRAM;  Surgeon: Lennette Bihari, MD;  Location: [facility] CATH LAB;  Service: Cardiovascular;  Laterality: N/A;  . LIGATION OF COMPETING BRANCHES OF ARTERIOVENOUS FISTULA Left 09/06/2014   Procedure: LIGATION OF COMPETING BRANCHES OF ARTERIOVENOUS FISTULA;  Surgeon: Pryor Ochoa, MD;  Location: [facility] OR;  Service: Vascular;  Laterality: Left;  Marland Kitchen MULTIPLE TOOTH EXTRACTIONS  09/2013   Remonve remaining 3 teeth, no with dentures     OB History    No data available       Home Medications    Prior to Admission medications   Medication Sig Start Date End Date Taking? Authorizing Provider  amLODipine (NORVASC) 10 MG tablet Take 10 mg by mouth at bedtime.    Historical Provider, MD  carvedilol (COREG) 12.5 MG tablet One twice daily to strengthen the heart and control BP Patient not taking: Reported on 04/19/2016 02/12/15   [name], MD  docusate sodium (COLACE) 100 MG capsule Take 1 capsule (100 mg total) by mouth daily. To prevent constipation Patient not taking: Reported on 04/19/2016 09/06/15   Laurence Spates, MD  ENSURE (ENSURE) Take 237 mLs by mouth 3 (three) times daily between meals.    Historical Provider, MD  ferrous sulfate 325 (65 FE) MG tablet Take 1 tablet (325 mg total) by mouth daily. Patient not taking: Reported on 04/19/2016 09/06/15   Laurence Spates, MD  fluocinonide cream (LIDEX) 0.05 % Apply 1 application topically 2 (two) times daily. Apply from neck down twice a day (provided by dermatology).    Historical Provider, MD  hydrALAZINE (APRESOLINE) 25 MG tablet Take 1 tablet (25 mg total) by mouth every 8 (eight) hours. Patient not taking: Reported on 04/19/2016 11/07/14   [name], MD  hydrocortisone 2.5 % lotion Apply 1 application topically daily.  Apply to face 09/05/15   Historical Provider, MD  lidocaine-prilocaine (EMLA) cream Apply 1 application topically daily as needed (prior to dialysis).     Historical Provider, MD  multivitamin (RENA-VIT) TABS tablet Take 1 tablet by mouth at bedtime. Patient not taking: Reported on 04/19/2016 03/15/15   Penny Pia, MD  Nutritional Supplements (FEEDING SUPPLEMENT, NEPRO CARB STEADY,) LIQD Take 237 mLs by mouth as needed (missed meal during dialysis.). Patient not taking: Reported on 04/19/2016 03/15/15   Penny Pia, MD  Nutritional Supplements (FEEDING SUPPLEMENT, NEPRO CARB STEADY,) LIQD Take 237 mLs by mouth 3 (three) times daily between meals. 04/23/16   Richarda Overlie, MD  polyethylene glycol (MIRALAX / GLYCOLAX) packet Take 17 g by mouth daily as needed for mild constipation. 04/23/16   Richarda Overlie, MD  sertraline (ZOLOFT) 50 MG tablet Take 1 tablet (50 mg total) by mouth daily. Patient must schedule and keep appointment for before any more refills. Patient not taking: Reported on 04/19/2016 01/16/16  Kimber RelicArthur G Green, MD  sevelamer carbonate (RENVELA) 800 MG tablet Take 1 tablet (800 mg total) by mouth 3 (three) times daily with meals. 04/23/16   Richarda OverlieNayana Abrol, MD    Family History Family History  Problem Relation Age of Onset  . Diabetes Mother     Social History Social History  Substance Use Topics  . Smoking status: Former Smoker    Types: Cigarettes    Quit date: 08/10/2000  . Smokeless tobacco: Never Used  . Alcohol use No     Allergies   Heparin   Review of Systems Review of Systems  Respiratory: Negative for shortness of breath.   Cardiovascular: Negative for chest pain.  Neurological: Negative for headaches.  All other systems reviewed and are negative.    Physical Exam Updated Vital Signs BP (!) 143/108   Pulse 67   Temp 98.2 F (36.8 C) (Oral)   Resp 18   SpO2 97%   Physical Exam  Constitutional: She is oriented to person, place, and time. She appears  well-developed and well-nourished. No distress.  HENT:  Head: Normocephalic.  Mouth/Throat: Oropharynx is clear and moist.  There is a 0.5 cm laceration to the right upper forehead. Bleeding is controlled.  Eyes: EOM are normal. Pupils are equal, round, and reactive to light.  Neck: Normal range of motion. Neck supple.  Cardiovascular: Normal rate and regular rhythm.  Exam reveals no gallop and no friction rub.   No murmur heard. Pulmonary/Chest: Effort normal and breath sounds normal. No respiratory distress. She has no wheezes.  Abdominal: Soft. Bowel sounds are normal. She exhibits no distension. There is no tenderness.  Musculoskeletal: Normal range of motion.  There is tenderness to palpation over the lateral aspect of the right hip. There is no shortening or rotation of the leg. DP pulses are palpable and motor and sensation are intact to the entire foot.  Neurological: She is alert and oriented to person, place, and time. No cranial nerve deficit. Coordination normal.  Skin: Skin is warm and dry. She is not diaphoretic.  Nursing note and vitals reviewed.    ED Treatments / Results  Labs (all labs ordered are listed, but only abnormal results are displayed) Labs Reviewed  BASIC METABOLIC PANEL  CBC WITH DIFFERENTIAL/PLATELET    LACERATION REPAIR Performed by: Althia FortsAdam Johnson resident MD under my direct supervision Authorized by: Geoffery LyonseLo, Moxon Messler Consent: Verbal consent obtained. Risks and benefits: risks, benefits and alternatives were discussed Consent given by: patient Patient identity confirmed: provided demographic data Prepped and Draped in normal sterile fashion Wound explored  Laceration Location: Right forehead  Laceration Length: 2.5 cm  No Foreign Bodies seen or palpated  Anesthesia: local infiltration  Local anesthetic: lidocaine 1% without epinephrine  Anesthetic total: 3 ml  Irrigation method: syringe Amount of cleaning: standard  Skin closure: 6-0  Prolene   Number of sutures: 3   Technique: Simple interrupted   Patient tolerance: Patient tolerated the procedure well with no immediate complications.    Radiology No results found.  Procedures Procedures (including critical care time)  Medications Ordered in ED Medications - No data to display   Initial Impression / Assessment and Plan / ED Course  I have reviewed the triage vital signs and the nursing notes.  Pertinent labs & imaging results that were available during my care of the patient were reviewed by me and considered in my medical decision making (see chart for details).  Clinical Course    Patient brought by EMS  after a fall at her extended care facility. She reports losing her balance trying to get into a wheelchair. She struck her head on the floor and caused a laceration to the right upper forehead. This was repaired. Her imaging studies reveal no evidence for intracranial injury, cervical spine fracture, or bony abnormality of the pelvis or right hip. I see no indication for further evaluation. She appears quite stable and is complaining of no other discomfort. She had good range of motion of the right hip and I doubt an occult fracture. She will be returned to her extended care facility and is to return as needed for any problems.  Final Clinical Impressions(s) / ED Diagnoses   Final diagnoses:  None    New Prescriptions New Prescriptions   No medications on file     Geoffery Lyons, MD 05/12/16 1231

## 2016-05-12 NOTE — Discharge Instructions (Signed)
Local wound care with bacitracin and dressing changes twice daily. Sutures are to be removed in 5 days. Return to the ER sooner for redness, pus straining from the wound, increased pain, or other new and concerning symptoms.

## 2016-05-12 NOTE — ED Notes (Signed)
MD at bedside.  Discharge being discussed. arrangement to be made for transportation due to fall risk.

## 2016-05-12 NOTE — ED Notes (Signed)
Pt back from CT

## 2016-05-12 NOTE — ED Notes (Signed)
Patient transported to CT 

## 2016-05-12 NOTE — ED Triage Notes (Signed)
[facility].  Pt was walking in her room, fall was unwitnessed; laceration to R side of head.  Unsure LOC.

## 2016-05-12 NOTE — ED Notes (Signed)
Right forearm with 22 g IV. Placed by EMS,. Removed for discharge.

## 2016-05-13 NOTE — Telephone Encounter (Signed)
Forms received again. I called patient again to question if she requested these supplies.  I was unable to leave message on voicemail. I will try to reach patient again later

## 2016-05-21 NOTE — Telephone Encounter (Signed)
Called patient, no answer, I was unable to leave message on machine. (No voicemail)

## 2016-06-16 ENCOUNTER — Other Ambulatory Visit: Payer: Self-pay | Admitting: *Deleted

## 2016-06-16 DIAGNOSIS — T82510A Breakdown (mechanical) of surgically created arteriovenous fistula, initial encounter: Secondary | ICD-10-CM

## 2016-07-05 ENCOUNTER — Ambulatory Visit (HOSPITAL_COMMUNITY)
Admission: RE | Admit: 2016-07-05 | Discharge: 2016-07-05 | Disposition: A | Discharge status: Home / Self Care | Payer: No Typology Code available for payment source | Source: Ambulatory Visit | Attending: Surgery | Admitting: Surgery

## 2016-07-05 DIAGNOSIS — T82510A Breakdown (mechanical) of surgically created arteriovenous fistula, initial encounter: Secondary | ICD-10-CM

## 2016-07-05 DIAGNOSIS — Y838 Other surgical procedures as the cause of abnormal reaction of the patient, or of later complication, without mention of misadventure at the time of the procedure: Secondary | ICD-10-CM | POA: Insufficient documentation

## 2016-07-07 ENCOUNTER — Encounter: Payer: Self-pay | Admitting: Vascular Surgery

## 2016-07-14 ENCOUNTER — Other Ambulatory Visit: Payer: Self-pay

## 2016-07-14 ENCOUNTER — Ambulatory Visit (INDEPENDENT_AMBULATORY_CARE_PROVIDER_SITE_OTHER): Payer: Medicare Other | Admitting: Vascular Surgery

## 2016-07-14 ENCOUNTER — Encounter: Payer: Self-pay | Admitting: Vascular Surgery

## 2016-07-14 VITALS — BP 148/66 | HR 68 | Temp 99.0°F | Resp 14 | Ht 63.0 in | Wt 100.0 lb

## 2016-07-14 DIAGNOSIS — Z992 Dependence on renal dialysis: Secondary | ICD-10-CM | POA: Diagnosis not present

## 2016-07-14 DIAGNOSIS — N186 End stage renal disease: Secondary | ICD-10-CM | POA: Diagnosis not present

## 2016-07-14 NOTE — Progress Notes (Signed)
Patient name: Natalie ReaderGeorgianna Steedman MRN: [id number] DOB: 07/18/1930 Sex: female  REASON FOR VISIT: To evaluate pseudoaneurysm of left arm AV fistula. Referred by Dr. Arrie Aranoladonato.  HPI: Natalie Wolfe is a 82 y.o. female who I previously repaired a pseudoaneurysm of her left brachiocephalic AV fistula in July 2016. She is referred today because of a large pseudoaneurysm just above her antecubital level on the left which she has had she states for about a year. However, this is gradually been getting larger. She denies any problems with her fistula otherwise.  Current Outpatient Prescriptions  Medication Sig Dispense Refill  . amLODipine (NORVASC) 10 MG tablet Take 10 mg by mouth at bedtime.    . carvedilol (COREG) 12.5 MG tablet One twice daily to strengthen the heart and control BP 60 tablet 5  . docusate sodium (COLACE) 100 MG capsule Take 1 capsule (100 mg total) by mouth daily. To prevent constipation 30 capsule 0  . ENSURE (ENSURE) Take 237 mLs by mouth 3 (three) times daily between meals.    . ferrous sulfate 325 (65 FE) MG tablet Take 1 tablet (325 mg total) by mouth daily. 30 tablet 0  . fluocinonide cream (LIDEX) 0.05 % Apply 1 application topically 2 (two) times daily. Apply from neck down twice a day (provided by dermatology).    . hydrALAZINE (APRESOLINE) 25 MG tablet Take 1 tablet (25 mg total) by mouth every 8 (eight) hours. 270 tablet 3  . lidocaine-prilocaine (EMLA) cream Apply 1 application topically daily as needed (prior to dialysis).     . multivitamin (RENA-VIT) TABS tablet Take 1 tablet by mouth at bedtime. 30 each 0  . Nutritional Supplements (FEEDING SUPPLEMENT, NEPRO CARB STEADY,) LIQD Take 237 mLs by mouth as needed (missed meal during dialysis.). 28 Can 0  . Nutritional Supplements (FEEDING SUPPLEMENT, NEPRO CARB STEADY,) LIQD Take 237 mLs by mouth 3 (three) times daily between meals. 30 Can 2  . polyethylene glycol (MIRALAX / GLYCOLAX) packet Take 17 g by mouth  daily as needed for mild constipation. 14 each 0  . sertraline (ZOLOFT) 50 MG tablet Take 1 tablet (50 mg total) by mouth daily. Patient must schedule and keep appointment for before any more refills. 30 tablet 0  . sevelamer carbonate (RENVELA) 800 MG tablet Take 1 tablet (800 mg total) by mouth 3 (three) times daily with meals. 120 tablet 0   No current facility-administered medications for this visit.     REVIEW OF SYSTEMS:  [X]  denotes positive finding, [ ]  denotes negative finding Cardiac  Comments:  Chest pain or chest pressure:    Shortness of breath upon exertion:    Short of breath when lying flat:    Irregular heart rhythm:    Constitutional    Fever or chills:      PHYSICAL EXAM: Vitals:   07/14/16 1115 07/14/16 1117  BP: (!) 149/67 (!) 148/66  Pulse: 70 68  Resp: 14   Temp: 99 F (37.2 C)   SpO2: 99%   Weight: 100 lb (45.4 kg)   Height: 5\' 3"  (1.6 m)     GENERAL: The patient is a well-nourished female, in no acute distress. The vital signs are documented above. CARDIOVASCULAR: There is a regular rate and rhythm. PULMONARY: There is good air exchange bilaterally without wheezing or rales. Her left upper arm fistula has an excellent thrill. She has a large pseudoaneurysm about 2 cm in diameter just above the antecubital level.  MEDICAL ISSUES:  LARGE PSEUDOANEURYSM LEFT BRACHIOCEPHALIC  AV FISTULA: Given the size of this large pseudoaneurysm I have recommended elective repair of this. This has been scheduled for 07/20/2016 which is a nondialysis day. This should still be 20 room to cannulate the fistula above this area after her revision. I have reviewed the procedure potential competitions and she is agreeable to proceed.  Waverly Ferrariickson, Zilpha Mcandrew [facility] [place] [facility] [phone]

## 2016-07-14 NOTE — Progress Notes (Signed)
Vitals:   07/14/16 1115  BP: (!) 149/67  Pulse: 70  Resp: 14  Temp: 99 F (37.2 C)  SpO2: 99%  Height: 5\' 3"  (1.6 m)

## 2016-07-19 ENCOUNTER — Encounter (HOSPITAL_COMMUNITY): Payer: Self-pay | Admitting: *Deleted

## 2016-07-19 NOTE — Progress Notes (Signed)
Spoke with Okey Regalarol at Dr. Adele Danickson's office and she has talked with the dialysis center and with Lawson [facility]ori [facility] and pt will be having surgery tomorrow and then dialysis on Wednesday. Lawson FiscalLori was given pre-op instructions by Okey Regalarol and I also gave her some too. She voiced understanding.

## 2016-07-19 NOTE — Progress Notes (Signed)
Was speaking with Lawson FiscalLori, nurse [facility] and she states tomorrow is pt's dialysis day. I have called and left a message for Judeth CornfieldStephanie at Dr. Adele Danickson's office to make sure that this date is correct.

## 2016-07-20 ENCOUNTER — Ambulatory Visit (HOSPITAL_COMMUNITY)
Admission: RE | Admit: 2016-07-20 | Discharge: 2016-07-20 | Disposition: A | Discharge status: Skilled Nursing Facility | Payer: Medicare Other | Source: Ambulatory Visit | Attending: Vascular Surgery | Admitting: Vascular Surgery

## 2016-07-20 ENCOUNTER — Encounter (HOSPITAL_COMMUNITY): Payer: Self-pay | Admitting: *Deleted

## 2016-07-20 ENCOUNTER — Ambulatory Visit (HOSPITAL_COMMUNITY): Payer: Medicare Other | Admitting: Certified Registered Nurse Anesthetist

## 2016-07-20 ENCOUNTER — Encounter (HOSPITAL_COMMUNITY)
Admission: RE | Disposition: A | Discharge status: Skilled Nursing Facility | Payer: Self-pay | Source: Ambulatory Visit | Attending: Vascular Surgery

## 2016-07-20 DIAGNOSIS — I509 Heart failure, unspecified: Secondary | ICD-10-CM | POA: Diagnosis not present

## 2016-07-20 DIAGNOSIS — Y832 Surgical operation with anastomosis, bypass or graft as the cause of abnormal reaction of the patient, or of later complication, without mention of misadventure at the time of the procedure: Secondary | ICD-10-CM | POA: Diagnosis not present

## 2016-07-20 DIAGNOSIS — N186 End stage renal disease: Secondary | ICD-10-CM | POA: Diagnosis not present

## 2016-07-20 DIAGNOSIS — Z87891 Personal history of nicotine dependence: Secondary | ICD-10-CM | POA: Diagnosis not present

## 2016-07-20 DIAGNOSIS — E1151 Type 2 diabetes mellitus with diabetic peripheral angiopathy without gangrene: Secondary | ICD-10-CM | POA: Insufficient documentation

## 2016-07-20 DIAGNOSIS — M1990 Unspecified osteoarthritis, unspecified site: Secondary | ICD-10-CM | POA: Diagnosis not present

## 2016-07-20 DIAGNOSIS — E1122 Type 2 diabetes mellitus with diabetic chronic kidney disease: Secondary | ICD-10-CM | POA: Insufficient documentation

## 2016-07-20 DIAGNOSIS — T82898A Other specified complication of vascular prosthetic devices, implants and grafts, initial encounter: Secondary | ICD-10-CM | POA: Insufficient documentation

## 2016-07-20 DIAGNOSIS — I132 Hypertensive heart and chronic kidney disease with heart failure and with stage 5 chronic kidney disease, or end stage renal disease: Secondary | ICD-10-CM | POA: Diagnosis not present

## 2016-07-20 DIAGNOSIS — I420 Dilated cardiomyopathy: Secondary | ICD-10-CM

## 2016-07-20 HISTORY — PX: PATCH ANGIOPLASTY: SHX6230

## 2016-07-20 HISTORY — PX: REVISON OF ARTERIOVENOUS FISTULA: SHX6074

## 2016-07-20 LAB — GLUCOSE, CAPILLARY: Glucose-Capillary: 95 mg/dL (ref 65–99)

## 2016-07-20 LAB — POCT I-STAT 4, (NA,K, GLUC, HGB,HCT)
GLUCOSE: 110 mg/dL — AB (ref 65–99)
HCT: 43 % (ref 36.0–46.0)
HEMOGLOBIN: 14.6 g/dL (ref 12.0–15.0)
POTASSIUM: 4 mmol/L (ref 3.5–5.1)
Sodium: 135 mmol/L (ref 135–145)

## 2016-07-20 SURGERY — REVISON OF ARTERIOVENOUS FISTULA
Anesthesia: Monitor Anesthesia Care | Site: Arm Upper | Laterality: Left

## 2016-07-20 MED ORDER — LIDOCAINE HCL (PF) 1 % IJ SOLN
INTRAMUSCULAR | Status: DC | PRN
  Administered 2016-07-20: 30 mL

## 2016-07-20 MED ORDER — SODIUM CHLORIDE 0.9 % IR SOLN
Status: DC | PRN
  Administered 2016-07-20: 1000 mL

## 2016-07-20 MED ORDER — THROMBIN 20000 UNITS EX SOLR
CUTANEOUS | Status: AC
  Filled 2016-07-20: qty 20000

## 2016-07-20 MED ORDER — FENTANYL CITRATE (PF) 100 MCG/2ML IJ SOLN
INTRAMUSCULAR | Status: AC
  Filled 2016-07-20: qty 2

## 2016-07-20 MED ORDER — CHLORHEXIDINE GLUCONATE CLOTH 2 % EX PADS: 6.0000 | MEDICATED_PAD | Freq: Once | CUTANEOUS | Status: DC

## 2016-07-20 MED ORDER — FENTANYL CITRATE (PF) 100 MCG/2ML IJ SOLN
INTRAMUSCULAR | Status: DC | PRN
  Administered 2016-07-20 (×2): 25 ug via INTRAVENOUS

## 2016-07-20 MED ORDER — PROPOFOL 500 MG/50ML IV EMUL
INTRAVENOUS | Status: DC | PRN
  Administered 2016-07-20: 50 ug/kg/min via INTRAVENOUS

## 2016-07-20 MED ORDER — SODIUM CHLORIDE 0.9 % IV SOLN
INTRAVENOUS | Status: AC
  Administered 2016-07-20: 10:00:00
  Filled 2016-07-20: qty 10

## 2016-07-20 MED ORDER — PAPAVERINE HCL 30 MG/ML IJ SOLN
INTRAMUSCULAR | Status: AC
  Filled 2016-07-20: qty 2

## 2016-07-20 MED ORDER — OXYCODONE-ACETAMINOPHEN 5-325 MG PO TABS: 1.0000 | ORAL_TABLET | Freq: Four times a day (QID) | ORAL | 0 refills | Status: DC | PRN

## 2016-07-20 MED ORDER — LIDOCAINE 2% (20 MG/ML) 5 ML SYRINGE
INTRAMUSCULAR | Status: DC | PRN
  Administered 2016-07-20: 20 mg via INTRAVENOUS

## 2016-07-20 MED ORDER — DEXTROSE 5 % IV SOLN
1.5000 g | INTRAVENOUS | Status: AC
  Administered 2016-07-20: 1.5 g via INTRAVENOUS
  Filled 2016-07-20: qty 1.5

## 2016-07-20 MED ORDER — SODIUM CHLORIDE 0.9 % IV SOLN
INTRAVENOUS | Status: DC | PRN
  Administered 2016-07-20: 09:00:00 via INTRAVENOUS

## 2016-07-20 MED ORDER — CARVEDILOL 12.5 MG PO TABS: 12.5000 mg | ORAL_TABLET | Freq: Two times a day (BID) | ORAL | Status: AC

## 2016-07-20 MED ORDER — SODIUM CHLORIDE 0.9 % IV SOLN: INTRAVENOUS | Status: DC

## 2016-07-20 MED ORDER — ARGATROBAN 50 MG/50ML IV SOLN
0.5000 ug/kg/min | INTRAVENOUS | Status: DC
  Administered 2016-07-20: 0.5 ug/kg/min via INTRAVENOUS
  Filled 2016-07-20: qty 50

## 2016-07-20 MED ORDER — LIDOCAINE HCL (PF) 1 % IJ SOLN
INTRAMUSCULAR | Status: AC
  Filled 2016-07-20: qty 30

## 2016-07-20 MED ORDER — NEPRO/CARBSTEADY PO LIQD: 237.0000 mL | Freq: Three times a day (TID) | ORAL | Status: AC

## 2016-07-20 SURGICAL SUPPLY — 39 items
ARMBAND PINK RESTRICT EXTREMIT (MISCELLANEOUS) ×3 IMPLANT
BNDG GAUZE ELAST 4 BULKY (GAUZE/BANDAGES/DRESSINGS) ×3 IMPLANT
CANISTER SUCTION 2500CC (MISCELLANEOUS) ×3 IMPLANT
CANNULA VESSEL 3MM 2 BLNT TIP (CANNULA) ×3 IMPLANT
CLIP TI MEDIUM 6 (CLIP) ×3 IMPLANT
CLIP TI WIDE RED SMALL 6 (CLIP) ×3 IMPLANT
COVER PROBE W GEL 5X96 (DRAPES) ×3 IMPLANT
DERMABOND ADVANCED (GAUZE/BANDAGES/DRESSINGS) ×2
DERMABOND ADVANCED .7 DNX12 (GAUZE/BANDAGES/DRESSINGS) ×1 IMPLANT
ELECT REM PT RETURN 9FT ADLT (ELECTROSURGICAL) ×3
ELECTRODE REM PT RTRN 9FT ADLT (ELECTROSURGICAL) ×1 IMPLANT
GLOVE BIO SURGEON STRL SZ 6.5 (GLOVE) ×2 IMPLANT
GLOVE BIO SURGEON STRL SZ7.5 (GLOVE) ×6 IMPLANT
GLOVE BIO SURGEONS STRL SZ 6.5 (GLOVE) ×1
GLOVE BIOGEL PI IND STRL 6.5 (GLOVE) ×1 IMPLANT
GLOVE BIOGEL PI IND STRL 7.0 (GLOVE) ×2 IMPLANT
GLOVE BIOGEL PI IND STRL 8 (GLOVE) ×2 IMPLANT
GLOVE BIOGEL PI INDICATOR 6.5 (GLOVE) ×2
GLOVE BIOGEL PI INDICATOR 7.0 (GLOVE) ×4
GLOVE BIOGEL PI INDICATOR 8 (GLOVE) ×4
GLOVE SURG SS PI 6.5 STRL IVOR (GLOVE) ×6 IMPLANT
GOWN STRL REUS W/ TWL LRG LVL3 (GOWN DISPOSABLE) ×3 IMPLANT
GOWN STRL REUS W/TWL LRG LVL3 (GOWN DISPOSABLE) ×6
KIT BASIN OR (CUSTOM PROCEDURE TRAY) ×3 IMPLANT
KIT ROOM TURNOVER OR (KITS) ×3 IMPLANT
NS IRRIG 1000ML POUR BTL (IV SOLUTION) ×3 IMPLANT
PACK CV ACCESS (CUSTOM PROCEDURE TRAY) ×3 IMPLANT
PAD ARMBOARD 7.5X6 YLW CONV (MISCELLANEOUS) ×6 IMPLANT
PATCH VASC XENOSURE 1CMX6CM (Vascular Products) ×2 IMPLANT
PATCH VASC XENOSURE 1X6 (Vascular Products) ×1 IMPLANT
SPONGE GAUZE 4X4 12PLY STER LF (GAUZE/BANDAGES/DRESSINGS) ×3 IMPLANT
SPONGE SURGIFOAM ABS GEL 100 (HEMOSTASIS) IMPLANT
SUT ETHILON 3 0 PS 1 (SUTURE) ×3 IMPLANT
SUT PROLENE 6 0 BV (SUTURE) ×6 IMPLANT
SUT VIC AB 3-0 SH 27 (SUTURE) ×4
SUT VIC AB 3-0 SH 27X BRD (SUTURE) ×2 IMPLANT
SUT VICRYL 4-0 PS2 18IN ABS (SUTURE) ×3 IMPLANT
UNDERPAD 30X30 (UNDERPADS AND DIAPERS) ×3 IMPLANT
WATER STERILE IRR 1000ML POUR (IV SOLUTION) ×3 IMPLANT

## 2016-07-20 NOTE — Interval H&P Note (Signed)
History and Physical Interval Note:  07/20/2016 8:32 AM  Natalie Wolfe  has presented today for surgery, with the diagnosis of End Stage Renal Disease N18.6; Left arm arteriovenous fistula pseudoaneurysm I72.9  The various methods of treatment have been discussed with the patient and family. After consideration of risks, benefits and other options for treatment, the patient has consented to  Procedure(s): REPAIR OF ARTERIOVENOUS FISTULA PSEUDOANEURYSM (Left) as a surgical intervention .  The patient's history has been reviewed, patient examined, no change in status, stable for surgery.  I have reviewed the patient's chart and labs.  Questions were answered to the patient's satisfaction.     Waverly Ferrariickson, Elyon Zoll

## 2016-07-20 NOTE — Anesthesia Postprocedure Evaluation (Signed)
Anesthesia Post Note  Patient: Natalie Wolfe  Procedure(s) Performed: Procedure(s) (LRB): REPAIR OF ARTERIOVENOUS FISTULA PSEUDOANEURYSM LEFT UPPER ARM (Left) PATCH ANGIOPLASTY LEFT UPPER ARM USING XENOSURE BIOLOGIC PATCH (Left)  Patient location during evaluation: PACU Anesthesia Type: MAC Level of consciousness: awake and alert Pain management: pain level controlled Vital Signs Assessment: post-procedure vital signs reviewed and stable Respiratory status: spontaneous breathing, nonlabored ventilation, respiratory function stable and patient connected to nasal cannula oxygen Cardiovascular status: stable and blood pressure returned to baseline Anesthetic complications: no    Last Vitals:  Vitals:   07/20/16 1145 07/20/16 1154  BP:  (!) 147/77  Pulse: 69 65  Resp: 16 19  Temp: 36.4 C     Last Pain:  Vitals:   07/20/16 1145  TempSrc:   PainSc: 0-No pain                 [name] S

## 2016-07-20 NOTE — Op Note (Signed)
   NAME: Natalie Wolfe   MRN: [id number] DOB: 1929-11-06    DATE OF OPERATION: 07/20/2016  PREOP DIAGNOSIS: Pseudoaneurysm left brachial cephalic AV fistula  POSTOP DIAGNOSIS: Same  PROCEDURE: Repair of pseudoaneurysm left brachiocephalic AV fistula with bovine pericardial patch  SURGEON: Di Kindlehristopher S. Edilia Boickson, MD, FACS  ASSIST: Karsten RoKim Trinh, [facility]  ANESTHESIA: Local with sedation   EBL: Minimal  INDICATIONS: Natalie Wolfe is a 82 y.o. female who has had a large pseudoaneurysm at her proximal anastomosis which has been enlarging. She presents for repair.  FINDINGS: Pseudoaneurysm was excised and the defect in the vein closed with a bovine pericardial patch  TECHNIQUE: The patient was taken to the operating room and sedated by anesthesia. The left upper extremity was prepped and draped in usual sterile fashion. Patient had a heparin allergy. For this reason, we used argatroban for anticoagulation.  An elliptical incision was made encompassing the large pseudoaneurysm after the skin was anesthetized. The pseudoaneurysm was dissected free. I also controlled the brachial artery proximal and distal to the anastomosis. The brachial artery was clamped proximally and distally and the fistula was clamped beyond the pseudoaneurysm. The pseudoaneurysm was opened and excised. There was a defect in the artery about 2 cm in length and a centimeter in width. I used a bovine pericardial patch to patch this. This was sewn with continuous 6-0 Prolene suture. At the completion was an excellent thrill in the fistula. The argatroban was stopped. Hemostasis was obtained in the wound. The wound was closed with interrupted 30 Vicryls and the skin closed with a running 4-0 Vicryl. Sterile dressing was applied. The patient tolerated the procedure well and transferred to the recovery room in stable condition. All needle and sponge counts were correct.  Waverly Ferrarihristopher Dickson, MD, FACS [facility]  DATE OF DICTATION:   07/20/2016

## 2016-07-20 NOTE — Transfer of Care (Signed)
Immediate Anesthesia Transfer of Care Note  Patient: Natalie Wolfe  Procedure(s) Performed: Procedure(s): REPAIR OF ARTERIOVENOUS FISTULA PSEUDOANEURYSM LEFT UPPER ARM (Left) PATCH ANGIOPLASTY LEFT UPPER ARM USING XENOSURE BIOLOGIC PATCH (Left)  Patient Location: PACU  Anesthesia Type:MAC  Level of Consciousness: awake, alert , oriented and patient cooperative  Airway & Oxygen Therapy: Patient Spontanous Breathing and Patient connected to nasal cannula oxygen  Post-op Assessment: Report given to RN and Post -op Vital signs reviewed and stable  Post vital signs: Reviewed and stable  Last Vitals:  Vitals:   07/20/16 0801 07/20/16 0802  BP: (!) 168/46   Pulse: 62 60  Resp: 18   Temp: 36.7 C     Last Pain:  Vitals:   07/20/16 0801  TempSrc: Oral      Patients Stated Pain Goal: 2 (07/20/16 0801)  Complications: No apparent anesthesia complications

## 2016-07-20 NOTE — Anesthesia Preprocedure Evaluation (Signed)
Anesthesia Evaluation  Patient identified by MRN, date of birth, ID band Patient awake    Reviewed: Allergy & Precautions, H&P , NPO status , Patient's Chart, lab work & pertinent test results  Airway Mallampati: II   Neck ROM: full    Dental   Pulmonary former smoker,    breath sounds clear to auscultation       Cardiovascular hypertension, + Peripheral Vascular Disease and +CHF   Rhythm:regular Rate:Normal     Neuro/Psych PSYCHIATRIC DISORDERS Anxiety Depression dementia  Neuromuscular disease    GI/Hepatic   Endo/Other  diabetes  Renal/GU ESRF and DialysisRenal disease     Musculoskeletal  (+) Arthritis ,   Abdominal   Peds  Hematology   Anesthesia Other Findings   Reproductive/Obstetrics                             Anesthesia Physical Anesthesia Plan  ASA: IV  Anesthesia Plan: MAC   Post-op Pain Management:    Induction: Intravenous  Airway Management Planned: Simple Face Mask  Additional Equipment:   Intra-op Plan:   Post-operative Plan:   Informed Consent: I have reviewed the patients History and Physical, chart, labs and discussed the procedure including the risks, benefits and alternatives for the proposed anesthesia with the patient or authorized representative who has indicated his/her understanding and acceptance.     Plan Discussed with: CRNA, Anesthesiologist and Surgeon  Anesthesia Plan Comments:         Anesthesia Quick Evaluation

## 2016-07-20 NOTE — Progress Notes (Signed)
Beta blocker held today heart rate 59

## 2016-07-20 NOTE — H&P (View-Only) (Signed)
Patient name: Natalie Wolfe MRN: [id number] DOB: 07/18/1930 Sex: female  REASON FOR VISIT: To evaluate pseudoaneurysm of left arm AV fistula. Referred by Dr. Arrie Aranoladonato.  HPI: Natalie Wolfe is a 82 y.o. female who I previously repaired a pseudoaneurysm of her left brachiocephalic AV fistula in July 2016. She is referred today because of a large pseudoaneurysm just above her antecubital level on the left which she has had she states for about a year. However, this is gradually been getting larger. She denies any problems with her fistula otherwise.  Current Outpatient Prescriptions  Medication Sig Dispense Refill  . amLODipine (NORVASC) 10 MG tablet Take 10 mg by mouth at bedtime.    . carvedilol (COREG) 12.5 MG tablet One twice daily to strengthen the heart and control BP 60 tablet 5  . docusate sodium (COLACE) 100 MG capsule Take 1 capsule (100 mg total) by mouth daily. To prevent constipation 30 capsule 0  . ENSURE (ENSURE) Take 237 mLs by mouth 3 (three) times daily between meals.    . ferrous sulfate 325 (65 FE) MG tablet Take 1 tablet (325 mg total) by mouth daily. 30 tablet 0  . fluocinonide cream (LIDEX) 0.05 % Apply 1 application topically 2 (two) times daily. Apply from neck down twice a day (provided by dermatology).    . hydrALAZINE (APRESOLINE) 25 MG tablet Take 1 tablet (25 mg total) by mouth every 8 (eight) hours. 270 tablet 3  . lidocaine-prilocaine (EMLA) cream Apply 1 application topically daily as needed (prior to dialysis).     . multivitamin (RENA-VIT) TABS tablet Take 1 tablet by mouth at bedtime. 30 each 0  . Nutritional Supplements (FEEDING SUPPLEMENT, NEPRO CARB STEADY,) LIQD Take 237 mLs by mouth as needed (missed meal during dialysis.). 28 Can 0  . Nutritional Supplements (FEEDING SUPPLEMENT, NEPRO CARB STEADY,) LIQD Take 237 mLs by mouth 3 (three) times daily between meals. 30 Can 2  . polyethylene glycol (MIRALAX / GLYCOLAX) packet Take 17 g by mouth  daily as needed for mild constipation. 14 each 0  . sertraline (ZOLOFT) 50 MG tablet Take 1 tablet (50 mg total) by mouth daily. Patient must schedule and keep appointment for before any more refills. 30 tablet 0  . sevelamer carbonate (RENVELA) 800 MG tablet Take 1 tablet (800 mg total) by mouth 3 (three) times daily with meals. 120 tablet 0   No current facility-administered medications for this visit.     REVIEW OF SYSTEMS:  [X]  denotes positive finding, [ ]  denotes negative finding Cardiac  Comments:  Chest pain or chest pressure:    Shortness of breath upon exertion:    Short of breath when lying flat:    Irregular heart rhythm:    Constitutional    Fever or chills:      PHYSICAL EXAM: Vitals:   07/14/16 1115 07/14/16 1117  BP: (!) 149/67 (!) 148/66  Pulse: 70 68  Resp: 14   Temp: 99 F (37.2 C)   SpO2: 99%   Weight: 100 lb (45.4 kg)   Height: 5\' 3"  (1.6 m)     GENERAL: The patient is a well-nourished female, in no acute distress. The vital signs are documented above. CARDIOVASCULAR: There is a regular rate and rhythm. PULMONARY: There is good air exchange bilaterally without wheezing or rales. Her left upper arm fistula has an excellent thrill. She has a large pseudoaneurysm about 2 cm in diameter just above the antecubital level.  MEDICAL ISSUES:  LARGE PSEUDOANEURYSM LEFT BRACHIOCEPHALIC  AV FISTULA: Given the size of this large pseudoaneurysm I have recommended elective repair of this. This has been scheduled for 07/20/2016 which is a nondialysis day. This should still be 20 room to cannulate the fistula above this area after her revision. I have reviewed the procedure potential competitions and she is agreeable to proceed.  Kiara Mcdowell [facility] [phone]    

## 2016-07-20 NOTE — Anesthesia Procedure Notes (Signed)
Procedure Name: MAC Date/Time: 07/20/2016 9:30 AM Performed by: Annabelle HarmanSMITH, Rmoni Keplinger A Pre-anesthesia Checklist: Patient identified, Emergency Drugs available, Suction available and Patient being monitored Patient Re-evaluated:Patient Re-evaluated prior to inductionOxygen Delivery Method: Nasal cannula

## 2016-07-21 ENCOUNTER — Encounter (HOSPITAL_COMMUNITY): Payer: Self-pay | Admitting: Vascular Surgery

## 2017-02-10 ENCOUNTER — Other Ambulatory Visit (HOSPITAL_COMMUNITY): Payer: Self-pay | Admitting: Nephrology

## 2017-02-10 DIAGNOSIS — N186 End stage renal disease: Secondary | ICD-10-CM

## 2017-02-11 ENCOUNTER — Ambulatory Visit (HOSPITAL_COMMUNITY)
Admission: RE | Admit: 2017-02-11 | Discharge: 2017-02-11 | Disposition: A | Discharge status: Home / Self Care | Payer: Medicare Other | Source: Ambulatory Visit | Attending: Nephrology | Admitting: Nephrology

## 2017-02-11 ENCOUNTER — Other Ambulatory Visit: Payer: Self-pay | Admitting: Radiology

## 2017-02-11 ENCOUNTER — Encounter (HOSPITAL_COMMUNITY): Payer: Self-pay

## 2017-02-11 DIAGNOSIS — Z538 Procedure and treatment not carried out for other reasons: Secondary | ICD-10-CM | POA: Diagnosis not present

## 2017-02-11 DIAGNOSIS — N186 End stage renal disease: Secondary | ICD-10-CM

## 2017-02-11 LAB — CBC
HCT: 35.3 % — ABNORMAL LOW (ref 36.0–46.0)
Hemoglobin: 10.6 g/dL — ABNORMAL LOW (ref 12.0–15.0)
MCH: 25.9 pg — ABNORMAL LOW (ref 26.0–34.0)
MCHC: 30 g/dL (ref 30.0–36.0)
MCV: 86.3 fL (ref 78.0–100.0)
PLATELETS: 148 10*3/uL — AB (ref 150–400)
RBC: 4.09 MIL/uL (ref 3.87–5.11)
RDW: 17.7 % — AB (ref 11.5–15.5)
WBC: 3.7 10*3/uL — AB (ref 4.0–10.5)

## 2017-02-11 LAB — BASIC METABOLIC PANEL
Anion gap: 10 (ref 5–15)
BUN: 19 mg/dL (ref 6–20)
CHLORIDE: 95 mmol/L — AB (ref 101–111)
CO2: 31 mmol/L (ref 22–32)
CREATININE: 4.88 mg/dL — AB (ref 0.44–1.00)
Calcium: 8.4 mg/dL — ABNORMAL LOW (ref 8.9–10.3)
GFR, EST AFRICAN AMERICAN: 9 mL/min — AB (ref >60)
GFR, EST NON AFRICAN AMERICAN: 7 mL/min — AB (ref >60)
Glucose, Bld: 110 mg/dL — ABNORMAL HIGH (ref 65–99)
POTASSIUM: 3.2 mmol/L — AB (ref 3.5–5.1)
SODIUM: 136 mmol/L (ref 135–145)

## 2017-02-11 LAB — APTT: APTT: 33 s (ref 24–36)

## 2017-02-11 LAB — GLUCOSE, CAPILLARY: Glucose-Capillary: 125 mg/dL — ABNORMAL HIGH (ref 65–99)

## 2017-02-11 LAB — PROTIME-INR
INR: 0.98
Prothrombin Time: 13 seconds (ref 11.4–15.2)

## 2017-02-11 MED ORDER — SODIUM CHLORIDE 0.9 % IV SOLN: INTRAVENOUS | Status: DC

## 2017-02-11 NOTE — Progress Notes (Signed)
Patient sent here for a declot as apparently she had low pressures during HD yesterday.  However, when the patient arrived today she had a strong thrill in her LUE graft.  She was not felt to need a declot and is ok to be discharged back to her facility.  This has been discussed with Dr. Loreta AveWagner.  Cloyce Blankenhorn E 1:37 PM 02/11/2017

## 2017-02-26 ENCOUNTER — Encounter (HOSPITAL_COMMUNITY): Payer: Self-pay

## 2017-02-26 ENCOUNTER — Observation Stay (HOSPITAL_COMMUNITY)
Admission: EM | Admit: 2017-02-26 | Discharge: 2017-03-01 | Disposition: A | Discharge status: Home / Self Care | Payer: Medicare Other | Attending: Nephrology | Admitting: Nephrology

## 2017-02-26 ENCOUNTER — Emergency Department (HOSPITAL_COMMUNITY): Payer: Medicare Other

## 2017-02-26 DIAGNOSIS — Y998 Other external cause status: Secondary | ICD-10-CM | POA: Diagnosis not present

## 2017-02-26 DIAGNOSIS — S32592A Other specified fracture of left pubis, initial encounter for closed fracture: Secondary | ICD-10-CM | POA: Diagnosis present

## 2017-02-26 DIAGNOSIS — E1122 Type 2 diabetes mellitus with diabetic chronic kidney disease: Secondary | ICD-10-CM | POA: Insufficient documentation

## 2017-02-26 DIAGNOSIS — I509 Heart failure, unspecified: Secondary | ICD-10-CM | POA: Insufficient documentation

## 2017-02-26 DIAGNOSIS — Y9389 Activity, other specified: Secondary | ICD-10-CM | POA: Diagnosis not present

## 2017-02-26 DIAGNOSIS — D638 Anemia in other chronic diseases classified elsewhere: Secondary | ICD-10-CM | POA: Diagnosis present

## 2017-02-26 DIAGNOSIS — Z87891 Personal history of nicotine dependence: Secondary | ICD-10-CM | POA: Diagnosis not present

## 2017-02-26 DIAGNOSIS — Z992 Dependence on renal dialysis: Secondary | ICD-10-CM | POA: Insufficient documentation

## 2017-02-26 DIAGNOSIS — S79912A Unspecified injury of left hip, initial encounter: Secondary | ICD-10-CM | POA: Diagnosis present

## 2017-02-26 DIAGNOSIS — I132 Hypertensive heart and chronic kidney disease with heart failure and with stage 5 chronic kidney disease, or end stage renal disease: Secondary | ICD-10-CM | POA: Insufficient documentation

## 2017-02-26 DIAGNOSIS — Z79899 Other long term (current) drug therapy: Secondary | ICD-10-CM | POA: Diagnosis not present

## 2017-02-26 DIAGNOSIS — S32502A Unspecified fracture of left pubis, initial encounter for closed fracture: Principal | ICD-10-CM | POA: Insufficient documentation

## 2017-02-26 DIAGNOSIS — W010XXA Fall on same level from slipping, tripping and stumbling without subsequent striking against object, initial encounter: Secondary | ICD-10-CM | POA: Insufficient documentation

## 2017-02-26 DIAGNOSIS — N186 End stage renal disease: Secondary | ICD-10-CM | POA: Diagnosis not present

## 2017-02-26 DIAGNOSIS — W19XXXA Unspecified fall, initial encounter: Secondary | ICD-10-CM

## 2017-02-26 DIAGNOSIS — F03918 Unspecified dementia, unspecified severity, with other behavioral disturbance: Secondary | ICD-10-CM | POA: Diagnosis present

## 2017-02-26 DIAGNOSIS — Y929 Unspecified place or not applicable: Secondary | ICD-10-CM | POA: Insufficient documentation

## 2017-02-26 DIAGNOSIS — I10 Essential (primary) hypertension: Secondary | ICD-10-CM | POA: Diagnosis present

## 2017-02-26 DIAGNOSIS — F329 Major depressive disorder, single episode, unspecified: Secondary | ICD-10-CM | POA: Diagnosis not present

## 2017-02-26 DIAGNOSIS — F0391 Unspecified dementia with behavioral disturbance: Secondary | ICD-10-CM | POA: Diagnosis present

## 2017-02-26 LAB — CBC WITH DIFFERENTIAL/PLATELET
Basophils Absolute: 0 10*3/uL (ref 0.0–0.1)
Basophils Relative: 0 %
EOS ABS: 0 10*3/uL (ref 0.0–0.7)
EOS PCT: 1 %
HCT: 31.5 % — ABNORMAL LOW (ref 36.0–46.0)
Hemoglobin: 9.7 g/dL — ABNORMAL LOW (ref 12.0–15.0)
LYMPHS ABS: 1.1 10*3/uL (ref 0.7–4.0)
Lymphocytes Relative: 21 %
MCH: 25.2 pg — AB (ref 26.0–34.0)
MCHC: 30.8 g/dL (ref 30.0–36.0)
MCV: 81.8 fL (ref 78.0–100.0)
Monocytes Absolute: 0.4 10*3/uL (ref 0.1–1.0)
Monocytes Relative: 7 %
Neutro Abs: 3.8 10*3/uL (ref 1.7–7.7)
Neutrophils Relative %: 71 %
PLATELETS: 176 10*3/uL (ref 150–400)
RBC: 3.85 MIL/uL — AB (ref 3.87–5.11)
RDW: 16.3 % — AB (ref 11.5–15.5)
WBC: 5.3 10*3/uL (ref 4.0–10.5)

## 2017-02-26 LAB — BASIC METABOLIC PANEL
Anion gap: 11 (ref 5–15)
BUN: 30 mg/dL — AB (ref 6–20)
CALCIUM: 8.1 mg/dL — AB (ref 8.9–10.3)
CO2: 29 mmol/L (ref 22–32)
CREATININE: 6.07 mg/dL — AB (ref 0.44–1.00)
Chloride: 92 mmol/L — ABNORMAL LOW (ref 101–111)
GFR calc Af Amer: 7 mL/min — ABNORMAL LOW (ref >60)
GFR, EST NON AFRICAN AMERICAN: 6 mL/min — AB (ref >60)
Glucose, Bld: 104 mg/dL — ABNORMAL HIGH (ref 65–99)
POTASSIUM: 3.3 mmol/L — AB (ref 3.5–5.1)
SODIUM: 132 mmol/L — AB (ref 135–145)

## 2017-02-26 MED ORDER — FENTANYL CITRATE (PF) 100 MCG/2ML IJ SOLN
50.0000 ug | Freq: Once | INTRAMUSCULAR | Status: AC
  Administered 2017-02-26: 50 ug via INTRAVENOUS
  Filled 2017-02-26: qty 2

## 2017-02-26 NOTE — ED Provider Notes (Signed)
[facility]-EMERGENCY DEPT Provider Note   CSN: [id number] Arrival date & time: 02/26/17  2102     History   Chief Complaint Chief Complaint  Patient presents with  . Hip Pain    HPI Natalie Wolfe is a 83 y.o. female.  Patient who is DNR, with PMH of dementia, ESRD on HD, CHF, DM, presents to the ED with a chief complaint of left hip pain.  She states that she fell yesterday and injured her hip, but states that she has been having pain in the hip for a while now.  It was worsened after falling yesterday.  She was sent to the ED for further evaluation after a left superior pubic ramus fracture was seen on plain films at her nursing home.  She was unable to go to dialysis today because of the pain.  She states that the pain is moderate to severe.  It has limited her mobility, and she has not been able to walk today.  She has taken percocet per nursing home records for the pain.  She denies any fever, chills, cough, n/v/d.  She states that she has frequent falls.   The history is provided by the patient. No language interpreter was used.    Past Medical History:  Diagnosis Date  . Anemia, unspecified   . Cataract   . CHF (congestive heart failure) (HCC)   . Chronic kidney disease, stage II (mild)    [facility]  . Dementia    forgetful, .  . Failure to thrive in adult 03/09/2015  . Insomnia, unspecified   . Memory loss 02/10/2015  . Nocturia   . Obesity, unspecified   . Other and unspecified hyperlipidemia   . Other anxiety states   . Other atopic dermatitis and related conditions   . Pain in joint, lower leg   . PVD (peripheral vascular disease) (HCC)   . Rash and other nonspecific skin eruption   . Routine general medical examination at a health care facility   . Thrombocytopenia (HCC)   . 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 Unspecified essential hypertension   . Unspecified vitamin D deficiency   . Urinary frequency     Patient Active Problem List   Diagnosis Date Noted  . Goals of care, counseling/discussion   . Palliative care encounter   . DNR (do not resuscitate)   . ESRD needing dialysis (HCC) 04/19/2016  . Leukopenia 04/19/2016  . Uremia 04/19/2016  . Hypothermia 04/19/2016  . Uremia of renal origin 04/19/2016  . Dehydration   . Hypoglycemia 03/12/2015  . Failure to thrive in adult 03/09/2015  . Memory loss 02/10/2015  . Depression 02/05/2015  . Hyperkalemia 01/31/2015  . Pulmonary edema 12/23/2014  . Protein-calorie malnutrition, severe (HCC) 12/23/2014  . Anorexia 10/23/2014  . Normal coronary arteries 09/16/2014 09/17/2014  . Congestive dilated cardiomyopathy-EF 40-45% at cath, 35% by echo   . Troponin level elevated-0.2 09/13/2014  . Syncope 09/12/2014  . ESRD (end stage renal disease) (HCC) 09/12/2014  . Thrombocytopenia (HCC) 09/12/2014  . Cardiopulmonary arrest (HCC) 09/12/2014  . SOB (shortness of breath)   . Overactive bladder 01/14/2013  . Unspecified vitamin D deficiency 01/14/2013  . Anemia of chronic disease 01/14/2013  . Arthritis of knee 01/14/2013  . Essential hypertension 11/27/2012  . Hyperlipemia 11/27/2012  Past Surgical History:  Procedure Laterality Date  . ABDOMINAL HYSTERECTOMY  1970  . AV FISTULA PLACEMENT Left 09/06/2014   Procedure: CREATION OF LEFT UPPER ARM ARTERIOVENOUS (AV) FISTULA ;  Surgeon: Pryor Ochoa, MD;  Location: [facility] OR;  Service: Vascular;  Laterality: Left;  . BREAST SURGERY  1979   nodule removed  . EYE SURGERY  2004   catarcts removed from right eye.  Marland Kitchen FALSE ANEURYSM REPAIR Left 03/07/2015   Procedure: REPAIR PSEUDOANEURYSMS OF LEFT ARM ARTERIOVENOUS FISTULA;  Surgeon: Chuck Hint, MD;  Location: [facility] OR;  Service: Vascular;  Laterality: Left;  . INSERTION OF DIALYSIS  CATHETER N/A 09/06/2014   Procedure: INSERTION OF DIALYSIS CATHETER RIGHT INTERNAL JUGULAR VEIN;  Surgeon: Pryor Ochoa, MD;  Location: [facility] OR;  Service: Vascular;  Laterality: N/A;  . LEFT HEART CATHETERIZATION WITH CORONARY ANGIOGRAM N/A 09/17/2014   Procedure: LEFT HEART CATHETERIZATION WITH CORONARY ANGIOGRAM;  Surgeon: Lennette Bihari, MD;  Location: [facility] CATH LAB;  Service: Cardiovascular;  Laterality: N/A;  . LIGATION OF COMPETING BRANCHES OF ARTERIOVENOUS FISTULA Left 09/06/2014   Procedure: LIGATION OF COMPETING BRANCHES OF ARTERIOVENOUS FISTULA;  Surgeon: Pryor Ochoa, MD;  Location: [facility] OR;  Service: Vascular;  Laterality: Left;  Marland Kitchen MULTIPLE TOOTH EXTRACTIONS  09/2013   Remonve remaining 3 teeth, no with dentures   . PATCH ANGIOPLASTY Left 07/20/2016   Procedure: PATCH ANGIOPLASTY LEFT UPPER ARM USING XENOSURE BIOLOGIC PATCH;  Surgeon: Chuck Hint, MD;  Location: [facility] OR;  Service: Vascular;  Laterality: Left;  . REVISON OF ARTERIOVENOUS FISTULA Left 07/20/2016   Procedure: REPAIR OF ARTERIOVENOUS FISTULA PSEUDOANEURYSM LEFT UPPER ARM;  Surgeon: Chuck Hint, MD;  Location: [facility] OR;  Service: Vascular;  Laterality: Left;    OB History    No data available       Home Medications    Prior to Admission medications   Medication Sig Start Date End Date Taking? Authorizing Provider  acetaminophen (TYLENOL) 325 MG tablet Take 650 mg by mouth every 4 (four) hours as needed for moderate pain.    [provider]  amLODipine (NORVASC) 10 MG tablet Take 10 mg by mouth at bedtime.    [provider]  carvedilol (COREG) 12.5 MG tablet Take 1 tablet (12.5 mg total) by mouth 2 (two) times daily with a meal. 07/20/16   Maris Berger A, PA-C  docusate sodium (COLACE) 100 MG capsule Take 1 capsule (100 mg total) by mouth daily. To prevent constipation 09/06/15   Little, Ambrose Finland, MD  ferrous sulfate 325 (65 FE) MG tablet Take 1 tablet (325 mg total) by mouth  daily. 09/06/15   Little, Ambrose Finland, MD  fluocinonide cream (LIDEX) 0.05 % Apply 1 application topically 2 (two) times daily. Apply from neck down twice a day (provided by dermatology).    [provider]  hydrALAZINE (APRESOLINE) 25 MG tablet Take 1 tablet (25 mg total) by mouth every 8 (eight) hours. 11/07/14   [name], MD  lidocaine-prilocaine (EMLA) cream Apply 1 application topically daily as needed (prior to dialysis).     [provider]  multivitamin (RENA-VIT) TABS tablet Take 1 tablet by mouth at bedtime. 03/15/15   Penny Pia, MD  Nutritional Supplements (FEEDING SUPPLEMENT, NEPRO CARB STEADY,) LIQD Take 237 mLs by mouth 3 (three) times daily between meals. 04/23/16   Richarda Overlie, MD  Nutritional Supplements (FEEDING SUPPLEMENT, NEPRO CARB STEADY,) LIQD Take 237 mLs by mouth 3 (three) times daily between meals.  07/20/16   Raymond Gurney, PA-C  oxyCODONE-acetaminophen (PERCOCET/ROXICET) 5-325 MG tablet Take 1 tablet by mouth every 6 (six) hours as needed. 07/20/16   Maris Berger A, PA-C  polyethylene glycol (MIRALAX / GLYCOLAX) packet Take 17 g by mouth daily as needed for mild constipation. 04/23/16   Richarda Overlie, MD  sertraline (ZOLOFT) 50 MG tablet Take 1 tablet (50 mg total) by mouth daily. Patient must schedule and keep appointment for before any more refills. 01/16/16   [name], MD    Family History Family History  Problem Relation Age of Onset  . Diabetes Mother     Social History Social History  Substance Use Topics  . Smoking status: Former Smoker    Types: Cigarettes    Quit date: 08/10/2000  . Smokeless tobacco: Never Used  . Alcohol use No     Allergies   Heparin   Review of Systems Review of Systems  All other systems reviewed and are negative.    Physical Exam Updated Vital Signs BP (!) 160/69   Pulse 62   Temp 98.4 F (36.9 C) (Oral)   Resp 16   Ht 5' (1.524 m)   Wt 43.1 kg (95 lb)   SpO2 97%   BMI  18.55 kg/m   Physical Exam  Constitutional: She is oriented to person, place, and time. She appears well-developed and well-nourished.  HENT:  Head: Normocephalic and atraumatic.  Eyes: Pupils are equal, round, and reactive to light. Conjunctivae and EOM are normal.  Neck: Normal range of motion. Neck supple.  Cardiovascular: Normal rate and regular rhythm.  Exam reveals no gallop and no friction rub.   No murmur heard. Pulmonary/Chest: Effort normal and breath sounds normal. No respiratory distress. She has no wheezes. She has no rales. She exhibits no tenderness.  Abdominal: Soft. Bowel sounds are normal. She exhibits no distension and no mass. There is no tenderness. There is no rebound and no guarding.  Musculoskeletal: Normal range of motion. She exhibits no edema or tenderness.  Left hip mildly tender to palpation, exhibits increased pain with ROM, strength testing deferred 2/2 pain  Neurological: She is alert and oriented to person, place, and time.  Sensation intact  Skin: Skin is warm and dry.  Psychiatric: She has a normal mood and affect. Her behavior is normal. Judgment and thought content normal.  Nursing note and vitals reviewed.    ED Treatments / Results  Labs (all labs ordered are listed, but only abnormal results are displayed) Labs Reviewed  CBC WITH DIFFERENTIAL/PLATELET  BASIC METABOLIC PANEL    EKG  EKG Interpretation None       Radiology No results found.  Procedures Procedures (including critical care time)  Medications Ordered in ED Medications  fentaNYL (SUBLIMAZE) injection 50 mcg (not administered)     Initial Impression / Assessment and Plan / ED Course  I have reviewed the triage vital signs and the nursing notes.  Pertinent labs & imaging results that were available during my care of the patient were reviewed by me and considered in my medical decision making (see chart for details).     Patient with frequent falls.  Fell  yesterday and injured left hip.  Increased pain since then.  Unable to walk today due to pain, also missed dialysis.  Left superior pubic ramus fracture seen on plain films at outside facility; sent to ED for CT for any additional occult fractures.  CT scan also shows left inferior pubic ramus fracture.  Patient is unable to ambulate.  Appreciate Dr. Katrinka BlazingSmith for admitting the patient to the hospital for PT/OT. Patient will likely also need dialysis in the morning.    Final Clinical Impressions(s) / ED Diagnoses   Final diagnoses:  Pubic ramus fracture, left, closed, initial encounter [facility](HCC)    New Prescriptions New Prescriptions   No medications on file     Roxy HorsemanBrowning, Chandler Swiderski, Cordelia Poche-C 02/27/17 0419    Blane OharaZavitz, Joshua, MD 02/27/17 2328

## 2017-02-26 NOTE — ED Notes (Signed)
Patient transported to CT 

## 2017-02-26 NOTE — ED Triage Notes (Signed)
[facility] and missed dialysis today because she refused r/t pain in leg from unwitnessed fall sometime last night. XR preformed at facility showed left hip fracture per PTAR. Percocet 5-325 given @1730 , Roxy 10 given @2000 . [facility] reports pt was standing in room upon their arrival.

## 2017-02-26 NOTE — ED Notes (Signed)
Browning, PA at bedside 

## 2017-02-27 DIAGNOSIS — Z992 Dependence on renal dialysis: Secondary | ICD-10-CM | POA: Diagnosis not present

## 2017-02-27 DIAGNOSIS — N186 End stage renal disease: Secondary | ICD-10-CM | POA: Diagnosis not present

## 2017-02-27 DIAGNOSIS — S32592A Other specified fracture of left pubis, initial encounter for closed fracture: Secondary | ICD-10-CM | POA: Diagnosis present

## 2017-02-27 DIAGNOSIS — D638 Anemia in other chronic diseases classified elsewhere: Secondary | ICD-10-CM | POA: Diagnosis present

## 2017-02-27 DIAGNOSIS — I10 Essential (primary) hypertension: Secondary | ICD-10-CM

## 2017-02-27 DIAGNOSIS — F03918 Unspecified dementia, unspecified severity, with other behavioral disturbance: Secondary | ICD-10-CM | POA: Diagnosis present

## 2017-02-27 DIAGNOSIS — F0391 Unspecified dementia with behavioral disturbance: Secondary | ICD-10-CM

## 2017-02-27 DIAGNOSIS — W19XXXA Unspecified fall, initial encounter: Secondary | ICD-10-CM | POA: Diagnosis not present

## 2017-02-27 DIAGNOSIS — W19XXXD Unspecified fall, subsequent encounter: Secondary | ICD-10-CM

## 2017-02-27 DIAGNOSIS — S32502A Unspecified fracture of left pubis, initial encounter for closed fracture: Secondary | ICD-10-CM | POA: Diagnosis not present

## 2017-02-27 LAB — RENAL FUNCTION PANEL
ANION GAP: 9 (ref 5–15)
Albumin: 2.1 g/dL — ABNORMAL LOW (ref 3.5–5.0)
BUN: 33 mg/dL — ABNORMAL HIGH (ref 6–20)
CALCIUM: 8.2 mg/dL — AB (ref 8.9–10.3)
CO2: 31 mmol/L (ref 22–32)
CREATININE: 6.54 mg/dL — AB (ref 0.44–1.00)
Chloride: 92 mmol/L — ABNORMAL LOW (ref 101–111)
GFR calc non Af Amer: 5 mL/min — ABNORMAL LOW (ref >60)
GFR, EST AFRICAN AMERICAN: 6 mL/min — AB (ref >60)
Glucose, Bld: 106 mg/dL — ABNORMAL HIGH (ref 65–99)
Phosphorus: 5.1 mg/dL — ABNORMAL HIGH (ref 2.5–4.6)
Potassium: 3.4 mmol/L — ABNORMAL LOW (ref 3.5–5.1)
SODIUM: 132 mmol/L — AB (ref 135–145)

## 2017-02-27 LAB — MRSA PCR SCREENING: MRSA BY PCR: NEGATIVE

## 2017-02-27 MED ORDER — DOCUSATE SODIUM 100 MG PO CAPS
100.0000 mg | ORAL_CAPSULE | Freq: Every day | ORAL | Status: DC
  Administered 2017-02-27 – 2017-03-01 (×3): 100 mg via ORAL
  Filled 2017-02-27 (×3): qty 1

## 2017-02-27 MED ORDER — HYDRALAZINE HCL 25 MG PO TABS: 25.0000 mg | ORAL_TABLET | ORAL | Status: DC

## 2017-02-27 MED ORDER — CARVEDILOL 12.5 MG PO TABS: 12.5000 mg | ORAL_TABLET | ORAL | Status: DC

## 2017-02-27 MED ORDER — ACETAMINOPHEN 650 MG RE SUPP: 650.0000 mg | Freq: Four times a day (QID) | RECTAL | Status: DC | PRN

## 2017-02-27 MED ORDER — DOXERCALCIFEROL 4 MCG/2ML IV SOLN
1.0000 ug | INTRAVENOUS | Status: DC
  Administered 2017-02-28: 1 ug via INTRAVENOUS
  Filled 2017-02-27: qty 2

## 2017-02-27 MED ORDER — MORPHINE SULFATE 10 MG/5ML PO SOLN: 10.0000 mg | ORAL | Status: DC | PRN

## 2017-02-27 MED ORDER — AMLODIPINE BESYLATE 10 MG PO TABS
10.0000 mg | ORAL_TABLET | Freq: Every day | ORAL | Status: DC
  Administered 2017-02-27 – 2017-02-28 (×2): 10 mg via ORAL
  Filled 2017-02-27 (×2): qty 1

## 2017-02-27 MED ORDER — LAMOTRIGINE 25 MG PO TABS
25.0000 mg | ORAL_TABLET | Freq: Every day | ORAL | Status: DC
  Administered 2017-02-27 – 2017-02-28 (×2): 25 mg via ORAL
  Filled 2017-02-27 (×3): qty 1

## 2017-02-27 MED ORDER — ONDANSETRON HCL 4 MG/2ML IJ SOLN: 4.0000 mg | Freq: Four times a day (QID) | INTRAMUSCULAR | Status: DC | PRN

## 2017-02-27 MED ORDER — RENA-VITE PO TABS
1.0000 | ORAL_TABLET | Freq: Every day | ORAL | Status: DC
  Administered 2017-02-27 – 2017-02-28 (×2): 1 via ORAL
  Filled 2017-02-27 (×2): qty 1

## 2017-02-27 MED ORDER — POTASSIUM CHLORIDE CRYS ER 20 MEQ PO TBCR
20.0000 meq | EXTENDED_RELEASE_TABLET | Freq: Once | ORAL | Status: AC
  Administered 2017-02-27: 20 meq via ORAL
  Filled 2017-02-27: qty 1

## 2017-02-27 MED ORDER — ACETAMINOPHEN 325 MG PO TABS: 650.0000 mg | ORAL_TABLET | Freq: Four times a day (QID) | ORAL | Status: DC | PRN

## 2017-02-27 MED ORDER — PRO-STAT SUGAR FREE PO LIQD
30.0000 mL | Freq: Two times a day (BID) | ORAL | Status: DC
Start: 2017-02-27 — End: 2017-03-01
  Administered 2017-02-27 – 2017-03-01 (×3): 30 mL via ORAL
  Filled 2017-02-27 (×5): qty 30

## 2017-02-27 MED ORDER — MORPHINE SULFATE 10 MG/5ML PO SOLN: 5.0000 mg | ORAL | Status: DC | PRN

## 2017-02-27 MED ORDER — LORAZEPAM 0.5 MG PO TABS: 0.5000 mg | ORAL_TABLET | ORAL | Status: DC | PRN

## 2017-02-27 MED ORDER — LIDOCAINE-PRILOCAINE 2.5-2.5 % EX CREA: 1.0000 "application " | TOPICAL_CREAM | CUTANEOUS | Status: DC | PRN

## 2017-02-27 MED ORDER — FLUOCINONIDE 0.05 % EX CREA
1.0000 "application " | TOPICAL_CREAM | Freq: Two times a day (BID) | CUTANEOUS | Status: DC
  Administered 2017-02-27 – 2017-03-01 (×4): 1 via TOPICAL
  Filled 2017-02-27: qty 30

## 2017-02-27 MED ORDER — MORPHINE SULFATE (CONCENTRATE) 20 MG/ML PO SOLN: 5.0000 mg | ORAL | Status: DC

## 2017-02-27 MED ORDER — LEVALBUTEROL HCL 0.63 MG/3ML IN NEBU: 0.6300 mg | INHALATION_SOLUTION | Freq: Four times a day (QID) | RESPIRATORY_TRACT | Status: DC | PRN

## 2017-02-27 MED ORDER — HYDRALAZINE HCL 25 MG PO TABS
25.0000 mg | ORAL_TABLET | ORAL | Status: DC
  Administered 2017-02-27 – 2017-02-28 (×5): 25 mg via ORAL
  Filled 2017-02-27 (×5): qty 1

## 2017-02-27 MED ORDER — ONDANSETRON HCL 4 MG PO TABS: 4.0000 mg | ORAL_TABLET | Freq: Four times a day (QID) | ORAL | Status: DC | PRN

## 2017-02-27 MED ORDER — CARVEDILOL 12.5 MG PO TABS
12.5000 mg | ORAL_TABLET | ORAL | Status: DC
  Administered 2017-02-27 – 2017-02-28 (×3): 12.5 mg via ORAL
  Filled 2017-02-27 (×4): qty 1

## 2017-02-27 MED ORDER — OXYCODONE-ACETAMINOPHEN 5-325 MG PO TABS
1.0000 | ORAL_TABLET | Freq: Four times a day (QID) | ORAL | Status: DC | PRN
  Administered 2017-02-28 – 2017-03-01 (×2): 1 via ORAL

## 2017-02-27 MED ORDER — POLYETHYLENE GLYCOL 3350 17 G PO PACK: 17.0000 g | PACK | Freq: Every day | ORAL | Status: DC | PRN

## 2017-02-27 MED ORDER — SERTRALINE HCL 50 MG PO TABS
50.0000 mg | ORAL_TABLET | Freq: Every day | ORAL | Status: DC
  Administered 2017-02-27 – 2017-03-01 (×3): 50 mg via ORAL
  Filled 2017-02-27 (×3): qty 1

## 2017-02-27 NOTE — Evaluation (Signed)
Physical Therapy Evaluation Patient Details Name: Natalie Wolfe MRN: [id number] DOB: February 16, 1930 Today's Date: 02/27/2017   History of Present Illness  Patient presents to the hospital with a L pubic ramus fracture. she has an ortho cosult scheudled but there are no bed rest orders and therapy found her in a chair requesting to get back to bed. PMH includes: HTN DMII, thrombocytopenia; PVD, anxiety, anemia, CKD, CHF, dementia, insomnia   Clinical Impression  Patient requires significant assistance for all transfers. She is unable to ambulate at this time. She required a max a stand pivot transfer to get back to bed. She has an orthopedic consult ordered. Hold pending orthopedic consult. She would benefi tfrom further skilled  Acute therapy as well as therapy at a SNF after discharge.     Follow Up Recommendations SNF    Equipment Recommendations  None recommended by PT    Recommendations for Other Services Rehab consult     Precautions / Restrictions Precautions Precautions: Fall Restrictions Weight Bearing Restrictions: No Other Position/Activity Restrictions: none at this time. Re-assess after ortho-consult       Mobility  Bed Mobility Overal bed mobility: Needs Assistance Bed Mobility: Supine to Sit;Sit to Supine       Sit to supine: Max assist   General bed mobility comments: max a to get LE back into bed and to slide to the head of the bed. Patient reported pain   Transfers Overall transfer level: Needs assistance   Transfers: Stand Pivot Transfers   Stand pivot transfers: Max assist       General transfer comment: Max a stand pivot to the bed. Patient could not move eigther leg depsite max cuing. Patient unable to take a step   Ambulation/Gait                Stairs            Wheelchair Mobility    Modified Rankin (Stroke Patients Only)       Balance Overall balance assessment: Needs assistance Sitting-balance support: No upper  extremity supported Sitting balance-Leahy Scale: Good     Standing balance support: Bilateral upper extremity supported Standing balance-Leahy Scale: Poor Standing balance comment: ubnable to maintain standing position at this time                              Pertinent Vitals/Pain Pain Assessment: Faces Faces Pain Scale: Hurts whole lot Pain Location: left hip  Pain Descriptors / Indicators: Aching;Grimacing;Guarding Pain Intervention(s): Limited activity within patient's tolerance    Home Living Family/patient expects to be discharged to:: Skilled nursing facility                 Additional Comments: Patient came from SNF. Difficult to obtain acurate prior level of function 2nd to dementia     Prior Function Level of Independence: Independent with assistive device(s)         Comments: poorly managing     Hand Dominance        Extremity/Trunk Assessment   Upper Extremity Assessment Upper Extremity Assessment: Defer to OT evaluation    Lower Extremity Assessment Lower Extremity Assessment: LLE deficits/detail LLE Deficits / Details: pain with all movements of left LE        Communication   Communication: No difficulties  Cognition Arousal/Alertness: Awake/alert Behavior During Therapy: Restless;Anxious Overall Cognitive Status: History of cognitive impairments - at baseline  General Comments: demntia at baseline but patient could answer simple questions       General Comments      Exercises     Assessment/Plan    PT Assessment Patient needs continued PT services  PT Problem List Decreased strength;Decreased range of motion;Decreased activity tolerance;Decreased balance;Decreased mobility;Decreased safety awareness;Decreased knowledge of use of DME;Pain       PT Treatment Interventions Gait training;Stair training;Functional mobility training;Therapeutic activities;Therapeutic  exercise;DME instruction;Balance training;Patient/family education    PT Goals (Current goals can be found in the Care Plan section)  Acute Rehab PT Goals Patient Stated Goal: unable to state PT Goal Formulation: With patient    Frequency Min 3X/week   Barriers to discharge   Lives at a SNF    Co-evaluation               AM-PAC PT "6 Clicks" Daily Activity  Outcome Measure Difficulty turning over in bed (including adjusting bedclothes, sheets and blankets)?: A Lot Difficulty moving from lying on back to sitting on the side of the bed? : A Lot Difficulty sitting down on and standing up from a chair with arms (e.g., wheelchair, bedside commode, etc,.)?: A Lot Help needed moving to and from a bed to chair (including a wheelchair)?: A Lot Help needed walking in hospital room?: Total Help needed climbing 3-5 steps with a railing? : Total 6 Click Score: 10    End of Session Equipment Utilized During Treatment: Gait belt Activity Tolerance: Patient limited by pain Patient left: in bed;with call bell/phone within reach;with bed alarm set Nurse Communication: Mobility status PT Visit Diagnosis: Repeated falls (R29.6);Unsteadiness on feet (R26.81);Pain Pain - Right/Left: Left Pain - part of body: Hip    Time: [id number] PT Time Calculation (min) (ACUTE ONLY): 13 min   Charges:   PT Evaluation $PT Eval Moderate Complexity: 1 Procedure     PT G Codes:   PT G-Codes **NOT FOR INPATIENT CLASS** Functional Assessment Tool Used: AM-PAC 6 Clicks Basic Mobility;Clinical judgement Functional Limitation: Mobility: Walking and moving around Mobility: Walking and Moving Around Current Status (Q6578(G8978): At least 60 percent but less than 80 percent impaired, limited or restricted Mobility: Walking and Moving Around Goal Status [phone](G8979): At least 40 percent but less than 60 percent impaired, limited or restricted     Dessie Comaavid J Angalina Ante PT DPT  02/27/2017, 4:09 PM

## 2017-02-27 NOTE — Consult Note (Signed)
[facility] Renal Consultation Note    Indication for Consultation:  Management of ESRD/hemodialysis, anemia, hypertension/volume, and secondary hyperparathyroidism. PCP:  HPI: Natalie Wolfe is a 83 y.o. female with ESRD, HTN, Type 2 DM, HFr EF (last EF 35%), dementia who was admitted with after unwitnessed fall and found to have B pubic ramus fractures.  Pt unable to recall all of the events leading to admission. Per notes, she had an unwitnessed fall at her home [facility] which resulted in severe L leg pain. No LOC or head injury. She was unable to bear weight on the L leg. In the ED, she underwent pelvic CT which showed B pubic ramus fractures. Labs with low K (3.3), CXR with pulm edema. Had another fall prior to transfer to her 6E room. Currently, in room and eating breakfast. Denies CP, dyspnea, N/V/D, or fever.  She usually dialyzes on [facility] schedule [facility]. She did miss her Saturday HD session due to her injury.  Past Medical History:  Diagnosis Date  . Anemia, unspecified   . Cataract   . CHF (congestive heart failure) (HCC)   . Chronic kidney disease, stage II (mild)    [facility]  . Dementia    forgetful, .  . Failure to thrive in adult 03/09/2015  . Insomnia, unspecified   . Memory loss 02/10/2015  . Nocturia   . Obesity, unspecified   . Other and unspecified hyperlipidemia   . Other anxiety states   . Other atopic dermatitis and related conditions   . Pain in joint, lower leg   . PVD (peripheral vascular disease) (HCC)   . Rash and other nonspecific skin eruption   . Routine general medical examination at a health care facility   . Thrombocytopenia (HCC)   . Type II or unspecified type diabetes mellitus with renal manifestations, not stated as uncontrolled(250.40)    Lawson FiscalLori, [facility] states they are not aware that pt is diabetic (as 07/19/16)  . Unspecified essential  hypertension   . Unspecified vitamin D deficiency   . Urinary frequency    Past Surgical History:  Procedure Laterality Date  . ABDOMINAL HYSTERECTOMY  1970  . AV FISTULA PLACEMENT Left 09/06/2014   Procedure: CREATION OF LEFT UPPER ARM ARTERIOVENOUS (AV) FISTULA ;  Surgeon: Pryor OchoaJames D Lawson, MD;  [facility];  Service: Vascular;  Laterality: Left;  . BREAST SURGERY  1979   nodule removed  . EYE SURGERY  2004   catarcts removed from right eye.  Marland Kitchen. FALSE ANEURYSM REPAIR Left 03/07/2015   Procedure: REPAIR PSEUDOANEURYSMS OF LEFT ARM ARTERIOVENOUS FISTULA;  Surgeon: Chuck Hinthristopher S Dickson, MD;  [facility];  Service: Vascular;  Laterality: Left;  . INSERTION OF DIALYSIS CATHETER N/A 09/06/2014   Procedure: INSERTION OF DIALYSIS CATHETER RIGHT INTERNAL JUGULAR VEIN;  Surgeon: Pryor OchoaJames D Lawson, MD;  [facility];  Service: Vascular;  Laterality: N/A;  . LEFT HEART CATHETERIZATION WITH CORONARY ANGIOGRAM N/A 09/17/2014   Procedure: LEFT HEART CATHETERIZATION WITH CORONARY ANGIOGRAM;  Surgeon: Lennette Biharihomas A Kelly, MD;  [facility];  Service: Cardiovascular;  Laterality: N/A;  . LIGATION OF COMPETING BRANCHES OF ARTERIOVENOUS FISTULA Left 09/06/2014   Procedure: LIGATION OF COMPETING BRANCHES OF ARTERIOVENOUS FISTULA;  Surgeon: Pryor OchoaJames D Lawson, MD;  [facility];  Service: Vascular;  Laterality: Left;  Marland Kitchen. MULTIPLE TOOTH EXTRACTIONS  09/2013   Remonve remaining 3 teeth, no with dentures   . PATCH  ANGIOPLASTY Left 07/20/2016   Procedure: PATCH ANGIOPLASTY LEFT UPPER ARM USING XENOSURE BIOLOGIC PATCH;  Surgeon: Chuck Hint, MD;  [facility];  Service: Vascular;  Laterality: Left;  . REVISON OF ARTERIOVENOUS FISTULA Left 07/20/2016   Procedure: REPAIR OF ARTERIOVENOUS FISTULA PSEUDOANEURYSM LEFT UPPER ARM;  Surgeon: Chuck Hint, MD;  Location: [facility] OR;  Service: Vascular;  Laterality: Left;   Family History  Problem Relation Age of Onset  . Diabetes Mother    Social  History:  reports that she quit smoking about 16 years ago. Her smoking use included Cigarettes. She has never used smokeless tobacco. She reports that she does not drink alcohol or use drugs.  ROS: As per HPI otherwise negative.  Physical Exam: Vitals:   02/27/17 0000 02/27/17 0015 02/27/17 0217 02/27/17 0818  BP: (!) 163/62 (!) 166/64 (!) 175/68 (!) 167/60  Pulse: 63 64 61 68  Resp: 16 18 (!) 22 (!) 22  Temp:   98.4 F (36.9 C) 98.1 F (36.7 C)  TempSrc:   Oral Oral  SpO2: 96% 98% 96% 97%  Weight:   45.8 kg (101 lb)   Height:   5' (1.524 m)      General: Frail, elderly female, alert and in no acute distress. Head: Normocephalic, atraumatic, sclera non-icteric, mucus membranes are moist. Neck: Supple without lymphadenopathy/masses. JVD not elevated. Lungs: Clear bilaterally to auscultation without wheezes, rales, or rhonchi. Breathing is unlabored. Heart: RRR; 2/6 systolic murmur Abdomen: Soft, non-tender. Musculoskeletal: Reduced muscle tone. Lower extremities: No LE edema, no open wounds. Neuro: Alert and oriented X 3. Moves all extremities spontaneously. Psych:  Responds to questions appropriately with a normal affect. Dialysis Access: L AVF + bruit/thrill  Allergies  Allergen Reactions  . Heparin Other (See Comments)    POSITIVE Hep Induced Plt Ab and SRA 09/12/2014 and 09/17/2014 (2/9 mis-reported as negative by lab).  Pt rec'd SQ heparin and heparin w/ HD prior to + results.   Prior to Admission medications   Medication Sig Start Date End Date Taking? Authorizing Provider  amLODipine (NORVASC) 10 MG tablet Take 10 mg by mouth at bedtime.   Yes [provider]  carvedilol (COREG) 12.5 MG tablet Take 1 tablet (12.5 mg total) by mouth 2 (two) times daily with a meal. Patient taking differently: Take 12.5 mg by mouth See admin instructions. TWO TIMES A DAY, BUT HOLD ON DIALYSIS DAYS 07/20/16  Yes Maris Berger A, PA-C  docusate sodium (COLACE) 100 MG capsule Take 1  capsule (100 mg total) by mouth daily. To prevent constipation 09/06/15  Yes Little, Ambrose Finland, MD  fluocinonide cream (LIDEX) 0.05 % Apply 1 application topically 2 (two) times daily. Apply from neck down twice a day (provided by dermatology).   Yes [provider]  hydrALAZINE (APRESOLINE) 25 MG tablet Take 1 tablet (25 mg total) by mouth every 8 (eight) hours. Patient taking differently: Take 25 mg by mouth See admin instructions. EVERY 8 HOURS, BUT HOLD ON DIALYSIS DAYS 11/07/14  Yes [name], MD  lamoTRIgine (LAMICTAL) 25 MG tablet Take 25 mg by mouth at bedtime. 02/17/17  Yes [provider]  lidocaine-prilocaine (EMLA) cream Apply 1 application topically as needed (prior to dialysis).    Yes [provider]  LORazepam (ATIVAN) 0.5 MG tablet Place 0.5 mg under the tongue every 4 (four) hours as needed (for anxiety or agitation).   Yes [provider]  morphine (ROXANOL) 20 MG/ML concentrated solution Take 5-10 mg by mouth  See admin instructions. 5 mg every hour as needed for mild pain and 10 mg every hour as needed for moderate pain or dyspnea   Yes [provider]  multivitamin (RENA-VIT) TABS tablet Take 1 tablet by mouth at bedtime. 03/15/15  Yes Penny Pia, MD  Nutritional Supplements (FEEDING SUPPLEMENT, NEPRO CARB STEADY,) LIQD Take 237 mLs by mouth 3 (three) times daily between meals. 04/23/16  Yes Richarda Overlie, MD  oxyCODONE-acetaminophen (PERCOCET/ROXICET) 5-325 MG tablet Take 1 tablet by mouth every 6 (six) hours as needed. Patient taking differently: Take 1 tablet by mouth every 6 (six) hours as needed (for pain).  07/20/16  Yes Maris Berger A, PA-C  polyethylene glycol (MIRALAX / GLYCOLAX) packet Take 17 g by mouth daily as needed for mild constipation. 04/23/16  Yes Richarda Overlie, MD  sertraline (ZOLOFT) 50 MG tablet Take 1 tablet (50 mg total) by mouth daily. Patient must schedule and keep appointment for before any more  refills. Patient taking differently: Take 50 mg by mouth daily.  01/16/16  Yes [name], MD  ferrous sulfate 325 (65 FE) MG tablet Take 1 tablet (325 mg total) by mouth daily. Patient not taking: Reported on 02/26/2017 09/06/15   Little, Ambrose Finland, MD  Nutritional Supplements (FEEDING SUPPLEMENT, NEPRO CARB STEADY,) LIQD Take 237 mLs by mouth 3 (three) times daily between meals. Patient not taking: Reported on 02/26/2017 07/20/16   Raymond Gurney, PA-C   Current Facility-Administered Medications  Medication Dose Route Frequency Provider Last Rate Last Dose  . acetaminophen (TYLENOL) tablet 650 mg  650 mg Oral Q6H PRN Clydie Braun, MD       Or  . acetaminophen (TYLENOL) suppository 650 mg  650 mg Rectal Q6H PRN Madelyn Flavors A, MD      . amLODipine (NORVASC) tablet 10 mg  10 mg Oral QHS Smith, Rondell A, MD      . carvedilol (COREG) tablet 12.5 mg  12.5 mg Oral 2 times per day on Sun Mon Wed Fri Madelyn Flavors A, MD   12.5 mg at 02/27/17 1610  . docusate sodium (COLACE) capsule 100 mg  100 mg Oral Daily Smith, Rondell A, MD      . fluocinonide cream (LIDEX) 0.05 % 1 application  1 application Topical BID Smith, Rondell A, MD      . hydrALAZINE (APRESOLINE) tablet 25 mg  25 mg Oral 3 times per day on Sun Mon Wed Fri Madelyn Flavors A, MD   25 mg at 02/27/17 9604  . lamoTRIgine (LAMICTAL) tablet 25 mg  25 mg Oral QHS Smith, Rondell A, MD      . levalbuterol (XOPENEX) nebulizer solution 0.63 mg  0.63 mg Nebulization Q6H PRN Katrinka Blazing, Rondell A, MD      . LORazepam (ATIVAN) tablet 0.5 mg  0.5 mg Sublingual Q4H PRN Madelyn Flavors A, MD      . morphine 10 MG/5ML solution 10 mg  10 mg Oral Q4H PRN Madelyn Flavors A, MD      . morphine 10 MG/5ML solution 5 mg  5 mg Oral Q4H PRN Madelyn Flavors A, MD      . multivitamin (RENA-VIT) tablet 1 tablet  1 tablet Oral QHS Smith, Rondell A, MD      . ondansetron (ZOFRAN) tablet 4 mg  4 mg Oral Q6H PRN Madelyn Flavors A, MD       Or  . ondansetron  (ZOFRAN) injection 4 mg  4 mg Intravenous Q6H PRN Clydie Braun, MD      .  oxyCODONE-acetaminophen (PERCOCET/ROXICET) 5-325 MG per tablet 1 tablet  1 tablet Oral Q6H PRN Katrinka Blazing, Rondell A, MD      . polyethylene glycol (MIRALAX / GLYCOLAX) packet 17 g  17 g Oral Daily PRN Katrinka Blazing, Rondell A, MD      . sertraline (ZOLOFT) tablet 50 mg  50 mg Oral Daily Clydie Braun, MD       Labs: Basic Metabolic Panel:  Recent Labs Lab 02/26/17 2206 02/27/17 0716  NA 132* 132*  K 3.3* 3.4*  CL 92* 92*  CO2 29 31  GLUCOSE 104* 106*  BUN 30* 33*  CREATININE 6.07* 6.54*  CALCIUM 8.1* 8.2*  PHOS  --  5.1*   Liver Function Tests:  Recent Labs Lab 02/27/17 0716  ALBUMIN 2.1*   CBC:  Recent Labs Lab 02/26/17 2206  WBC 5.3  NEUTROABS 3.8  HGB 9.7*  HCT 31.5*  MCV 81.8  PLT 176   Studies/Results: Dg Chest 1 View  Result Date: 02/26/2017 CLINICAL DATA:  Unwitnessed fall yesterday.  Missed dialysis. EXAM: CHEST 1 VIEW COMPARISON:  1047 FINDINGS: Stable cardiomegaly. There is atherosclerosis and tortuosity of the thoracic aorta. Increased interstitial opacities over baseline and peribronchial cuffing suspicious for mild pulmonary edema. Possible small left pleural effusion. No confluent airspace disease. No pneumothorax. Left axillary stent is again seen. No acute osseous abnormalities. IMPRESSION: 1. Mild pulmonary edema and possible left pleural effusion. 2. Stable cardiomegaly and tortuous atherosclerotic thoracic aorta. Electronically Signed   By: Rubye Oaks M.D.   On: 02/26/2017 23:02   Ct Pelvis Wo Contrast  Result Date: 02/26/2017 CLINICAL DATA:  Unwitnessed fall. Stated history of superior pubic ramus fracture. EXAM: CT PELVIS WITHOUT CONTRAST TECHNIQUE: Multidetector CT imaging of the pelvis was performed following the standard protocol without intravenous contrast. COMPARISON:  None. FINDINGS: Urinary Tract: Urinary bladder is nondistended. Distal ureters are decompressed.  Bowel: Colonic diverticulosis throughout the entire visualized colon. No evidence of diverticulitis. No bowel obstruction. Vascular/Lymphatic: Advanced aortic atherosclerosis. No bulky adenopathy. Reproductive:  Uterus is surgically absent.  No adnexal mass. Other:  Mild generalized edema the pelvic fat. Musculoskeletal: Acute fracture of the left superior pubic ramus at the acetabular junction, mildly displaced. There is an acute nondisplaced left inferior pubic ramus fracture. Right inferior pubis ramus fracture is likely remote with surrounding callus. Questionable nondisplaced sacral fracture on the left. Sacroiliac joints and pubic symphysis are congruent. Proximal femurs are intact. Hip joint spaces are maintained. There is stranding about the left pubic rami fractures without large volume hemorrhage. IMPRESSION: 1. Acute mildly displaced fracture of the left superior pubic ramus at the acetabular junction. Acute nondisplaced left inferior pubic ramus fracture. 2. Questionable nondisplaced left sacral fracture arm. 3. Remote right inferior pubic ramus fracture with surrounding callus. 4. Aortic and branch atherosclerosis. Electronically Signed   By: Rubye Oaks M.D.   On: 02/26/2017 23:00    Dialysis Orders:  TTS at [facility] 4 hr, 2K/2.25Ca, BFR 300, DFRA1.5, EDW 45.2kg, AVF, no heparin - Mircera q 2 weeks (last given 6/26, just reordered) - Hectoral IV q HD  Assessment/Plan: 1.  Fall with B pubic ramus fractures: Per primary.  2.  ESRD: Usually TTS schedule, missed HD 7/21. Despite CXR with + volume, she is breathing comfortably on room air. K is fine (actually low and supp'd). No acute need for HD today, will plan on HD 7/23, then back to TTS schedule. 3.  Hypertension/volume: BP high, CXR with mild pulm edema. UF  with HD tomorrow. 4.  Anemia:Hgb 9.7. Will plan to resume ESA this week. 5.  Metabolic bone disease: Ca/Phos ok. Continue binders. 6.  Nutrition:  Alb  low, will order protein supps.  7.  HFrEF (35% in 2016): Per primary.  Ozzie Hoyle, PA-C 02/27/2017, 9:12 AM  [employer] Pager: [phone]

## 2017-02-27 NOTE — ED Notes (Signed)
Attempted report x 1-- staff member hung up on RN after stating 'please hold'

## 2017-02-27 NOTE — Evaluation (Signed)
Occupational Therapy Evaluation Patient Details Name: Natalie Wolfe MRN: [id number] DOB: August 05, 1930 Today's Date: 02/27/2017    History of Present Illness Patient presents to the hospital with a L pubic ramus fracture. she has an ortho cosult scheudled but there are no bed rest orders and therapy found her in a chair requesting to get back to bed. PMH includes: HTN, DMII, thrombocytopenia, rash, PVD, anxiety, emdentia, anemia, CHF, CKD and insomnia    Clinical Impression   PTA, pt reports that she lived alone at a SNF and performed her ADLs. Currently pt requires Mod for UB ADLs, Max A for LB ADLs, and Max A for stand pivot to recliner. Pt with orthopedic consult ordered, and will hold pending orthopedic consult. Pt will benefit from acute OT to facilitate safe dc and increase occupational participation. Recommend dc to SNF for further OT to increase safety and independence with ADLs and functional mobility.    Follow Up Recommendations  SNF    Equipment Recommendations  Other (comment) (Defer to next venue)    Recommendations for Other Services PT consult     Precautions / Restrictions Precautions Precautions: Fall Restrictions Weight Bearing Restrictions: No Other Position/Activity Restrictions: none at this time. Re-assess after ortho-consult       Mobility Bed Mobility Overal bed mobility: Needs Assistance Bed Mobility: Supine to Sit     Supine to sit: Max assist Sit to supine: Max assist   General bed mobility comments: Max A to transition hips with pad and guide BLEs  Transfers Overall transfer level: Needs assistance   Transfers: Stand Pivot Transfers   Stand pivot transfers: Max assist       General transfer comment: Max A to stand pivot to recliner.    Balance Overall balance assessment: Needs assistance Sitting-balance support: No upper extremity supported Sitting balance-Leahy Scale: Good     Standing balance support: Bilateral upper extremity  supported Standing balance-Leahy Scale: Poor Standing balance comment: unable to maintain standing position at this time                            ADL either performed or assessed with clinical judgement   ADL Overall ADL's : Needs assistance/impaired Eating/Feeding: Set up;Sitting Eating/Feeding Details (indicate cue type and reason): Pt reports little appetite. Sitting supported in recliner or at bedlevel Grooming: Set up;Bed level   Upper Body Bathing: Moderate assistance;Bed level   Lower Body Bathing: Maximal assistance;Bed level   Upper Body Dressing : Moderate assistance;Bed level   Lower Body Dressing: Maximal assistance;Bed level   Toilet Transfer: Maximal assistance;Stand-pivot (simulated to recliner)             General ADL Comments: Pt unable to perform grooming or UB ADLs at EOB due to lean to R with pain at L hip     Vision         Perception     Praxis      Pertinent Vitals/Pain Pain Assessment: Faces Faces Pain Scale: Hurts whole lot Pain Location: left hip  Pain Descriptors / Indicators: Aching;Grimacing;Guarding Pain Intervention(s): Monitored during session;Limited activity within patient's tolerance;Repositioned     Hand Dominance Right   Extremity/Trunk Assessment Upper Extremity Assessment Upper Extremity Assessment: Generalized weakness   Lower Extremity Assessment Lower Extremity Assessment: Defer to PT evaluation LLE Deficits / Details: pain with all movements of left LE    Cervical / Trunk Assessment Cervical / Trunk Assessment: Kyphotic   Communication Communication Communication: No  difficulties   Cognition Arousal/Alertness: Awake/alert Behavior During Therapy: WFL for tasks assessed/performed Overall Cognitive Status: History of cognitive impairments - at baseline                                 General Comments: demntia at baseline but patient could answer simple questions    General  Comments  Pt reports that she fall a lot at home stating "usually twice a day." (BP supine 153/87. Seated in recliner 158/49)    Exercises     Shoulder Instructions      Home Living Family/patient expects to be discharged to:: Skilled nursing facility                                 Additional Comments: Patient came from SNF. Difficult to obtain acurate prior level of function 2nd to dementia       Prior Functioning/Environment Level of Independence: Independent with assistive device(s)        Comments: Pt reports that she was doing ADLs and IADLs. Unsure of accuracy due to dementia diagnosis        OT Problem List: Decreased range of motion;Decreased activity tolerance;Impaired balance (sitting and/or standing);Decreased strength;Decreased cognition;Decreased safety awareness;Decreased knowledge of use of DME or AE;Decreased knowledge of precautions;Pain      OT Treatment/Interventions: Self-care/ADL training;Therapeutic exercise;Energy conservation;DME and/or AE instruction;Therapeutic activities;Patient/family education    OT Goals(Current goals can be found in the care plan section) Acute Rehab OT Goals Patient Stated Goal: unable to state OT Goal Formulation: With patient Time For Goal Achievement: 03/13/17 Potential to Achieve Goals: Good ADL Goals Pt Will Perform Upper Body Dressing: with min guard assist;sitting Pt Will Perform Lower Body Dressing: with mod assist;with adaptive equipment;sit to/from stand Pt Will Transfer to Toilet: with mod assist;bedside commode;stand pivot transfer Pt Will Perform Toileting - Clothing Manipulation and hygiene: with mod assist;sit to/from stand  OT Frequency: Min 2X/week   Barriers to D/C:            Co-evaluation              AM-PAC PT "6 Clicks" Daily Activity     Outcome Measure Help from another person eating meals?: None Help from another person taking care of personal grooming?: A Little Help from  another person toileting, which includes using toliet, bedpan, or urinal?: A Lot Help from another person bathing (including washing, rinsing, drying)?: A Lot Help from another person to put on and taking off regular upper body clothing?: A Lot Help from another person to put on and taking off regular lower body clothing?: A Lot 6 Click Score: 15   End of Session Equipment Utilized During Treatment: Gait belt Nurse Communication: Mobility status;Other (comment) (Ortho consult)  Activity Tolerance: Patient limited by pain Patient left: in chair;with call bell/phone within reach;with chair alarm set  OT Visit Diagnosis: Unsteadiness on feet (R26.81);Other abnormalities of gait and mobility (R26.89)                Time: 7829-5621 OT Time Calculation (min): 34 min Charges:  OT General Charges $OT Visit: 1 Procedure OT Evaluation $OT Eval Moderate Complexity: 1 Procedure OT Treatments $Self Care/Home Management : 8-22 mins G-Codes: OT G-codes **NOT FOR INPATIENT CLASS** Functional Assessment Tool Used: Clinical judgement Functional Limitation: Self care Self Care Current Status (H0865): At least 60 percent but less  than 80 percent impaired, limited or restricted Self Care Goal Status (Z6109(G8988): At least 20 percent but less than 40 percent impaired, limited or restricted   [employer]meren CorporationCharis Makayli Bracken MSOT, OTR/L Acute Rehab Pager: [phone] Office: [phone]  Theodoro GristCharis M Alivea Gladson 02/27/2017, 4:34 PM

## 2017-02-27 NOTE — ED Notes (Signed)
RN attempted to ambulate patient; pt assisted to sit on the side of the stretcher, and assisted to standing position where she beared weight on her R leg; pt attempted to bear weight to her L leg and was unable d/t pain; pt assisted back to bed; Ivar Drapeob Browning, PA and PA student notified

## 2017-02-27 NOTE — ED Notes (Signed)
RN attempted to call back, phone rang for 5 mins with no answer

## 2017-02-27 NOTE — Progress Notes (Signed)
Placed on low bed and pads placed on floor.  Dr Katrinka BlazingSmith in to see patient.

## 2017-02-27 NOTE — ED Provider Notes (Signed)
Patient [facility]examined in the Emergency Department [name] Patient reports leg pain after fall.  She has pubic rami fractures Exam : awake/alert, no distress, mild tenderness with ROM of left hip Plan: will need to ambulate to determine level of pain and may need admission     [name], Natalie Hensarling, MD 02/27/17 0006

## 2017-02-27 NOTE — H&P (Signed)
History and Physical    Jonasia Coiner ZOX:[id number] DOB: [birth year] DOA: 02/26/2017  Referring MD/NP/PA: Ivar Drape PA-C PCP: [name], MD  Patient coming from: [facility] via EMS  Chief Complaint: Fall  HPI: Natalie Wolfe is a 83 y.o. female with medical history significant of HTN, ESRD on HD, DM type 2, CHF, and dementia; who presents after having a fall nursing facility. History is obtained from patient as well as review of records. She reportedly had an unwitnessed fall. The patient claims that she is an independent living at [facility] and was tidying up her place when she lost her balance and fell. Denies any trauma to her head or loss of consciousness. She complains of pain on the left leg, and she is unable to bear weight on it. She complains of associated symptoms of weight loss of approximately 50 pounds over the last 2 months due to decreased appetite, but now reports that she is eating a lot better.   ED Course: Upon admission to the emergency department patient was noted to be afebrile, blood pressure up to 177/56, other vital signs within normal limits. Labs revealed hemoglobin 9.7, sodium 132, potassium 3.3, when necessary 30, creatinine 6.07. CXR mild pulmonary edema with possible left pleural effusion, and  stable cardiomegaly. CT scan of the pelvis revealed acute myeloid displaced fracture of the left superior pubic ramus at the acetabular junction and acute nondisplaced left inferior pubic ramus fracture. Patient was given fentanyl while in the ED, but was unable to bear weight on the left leg. Due to the fact that the patient missing dialysis. [facility] called to admit. Subsequently, patient was reported to have a second unwitnessed fall from the hospital gurney. Patient denies any additional complaints and states that she was going home.   Review of Systems: Review of Systems  Unable to perform ROS: Dementia  Constitutional: Positive for weight loss.  Negative for chills and fever.  HENT: Negative for nosebleeds and sore throat.   Eyes: Negative for photophobia and pain.  Respiratory: Positive for shortness of breath (Intermittently, but currently states that she is not short of breath.).   Cardiovascular: Negative for chest pain, palpitations and leg swelling.  Gastrointestinal: Negative for abdominal pain, nausea and vomiting.  Genitourinary: Negative for dysuria and frequency.  Musculoskeletal: Positive for falls and joint pain.  Skin: Negative for itching and rash.  Neurological: Negative for sensory change and focal weakness.  Psychiatric/Behavioral: Positive for memory loss. Negative for substance abuse.    Past Medical History:  Diagnosis Date  . Anemia, unspecified   . Cataract   . CHF (congestive heart failure) (HCC)   . Chronic kidney disease, stage II (mild)    [facility]  . Dementia    forgetful, .  . Failure to thrive in adult 03/09/2015  . Insomnia, unspecified   . Memory loss 02/10/2015  . Nocturia   . Obesity, unspecified   . Other and unspecified hyperlipidemia   . Other anxiety states   . Other atopic dermatitis and related conditions   . Pain in joint, lower leg   . PVD (peripheral vascular disease) (HCC)   . Rash and other nonspecific skin eruption   . Routine general medical examination at a health care facility   . Thrombocytopenia (HCC)   . Type II or unspecified type diabetes mellitus with renal manifestations, not stated as uncontrolled(250.40)    Lawson Fiscal, nurse at [facility] states they are not aware  that pt is diabetic (as 07/19/16)  . Unspecified essential hypertension   . Unspecified vitamin D deficiency   . Urinary frequency     Past Surgical History:  Procedure Laterality Date  . ABDOMINAL HYSTERECTOMY  1970  . AV FISTULA PLACEMENT Left 09/06/2014   Procedure: CREATION OF LEFT UPPER ARM ARTERIOVENOUS (AV) FISTULA ;  Surgeon: Pryor Ochoa, MD;   Location: [facility] OR;  Service: Vascular;  Laterality: Left;  . BREAST SURGERY  1979   nodule removed  . EYE SURGERY  2004   catarcts removed from right eye.  Marland Kitchen FALSE ANEURYSM REPAIR Left 03/07/2015   Procedure: REPAIR PSEUDOANEURYSMS OF LEFT ARM ARTERIOVENOUS FISTULA;  Surgeon: Chuck Hint, MD;  Location: [facility] OR;  Service: Vascular;  Laterality: Left;  . INSERTION OF DIALYSIS CATHETER N/A 09/06/2014   Procedure: INSERTION OF DIALYSIS CATHETER RIGHT INTERNAL JUGULAR VEIN;  Surgeon: Pryor Ochoa, MD;  Location: [facility] OR;  Service: Vascular;  Laterality: N/A;  . LEFT HEART CATHETERIZATION WITH CORONARY ANGIOGRAM N/A 09/17/2014   Procedure: LEFT HEART CATHETERIZATION WITH CORONARY ANGIOGRAM;  Surgeon: Lennette Bihari, MD;  Location: [facility] CATH LAB;  Service: Cardiovascular;  Laterality: N/A;  . LIGATION OF COMPETING BRANCHES OF ARTERIOVENOUS FISTULA Left 09/06/2014   Procedure: LIGATION OF COMPETING BRANCHES OF ARTERIOVENOUS FISTULA;  Surgeon: Pryor Ochoa, MD;  Location: [facility] OR;  Service: Vascular;  Laterality: Left;  Marland Kitchen MULTIPLE TOOTH EXTRACTIONS  09/2013   Remonve remaining 3 teeth, no with dentures   . PATCH ANGIOPLASTY Left 07/20/2016   Procedure: PATCH ANGIOPLASTY LEFT UPPER ARM USING XENOSURE BIOLOGIC PATCH;  Surgeon: Chuck Hint, MD;  Location: [facility] OR;  Service: Vascular;  Laterality: Left;  . REVISON OF ARTERIOVENOUS FISTULA Left 07/20/2016   Procedure: REPAIR OF ARTERIOVENOUS FISTULA PSEUDOANEURYSM LEFT UPPER ARM;  Surgeon: Chuck Hint, MD;  Location: [facility] OR;  Service: Vascular;  Laterality: Left;     reports that she quit smoking about 16 years ago. Her smoking use included Cigarettes. She has never used smokeless tobacco. She reports that she does not drink alcohol or use drugs.  Allergies  Allergen Reactions  . Heparin Other (See Comments)    POSITIVE Hep Induced Plt Ab and SRA 09/12/2014 and 09/17/2014 (2/9 mis-reported as negative by lab).  Pt rec'd SQ heparin and  heparin w/ HD prior to + results.    Family History  Problem Relation Age of Onset  . Diabetes Mother     Prior to Admission medications   Medication Sig Start Date End Date Taking? Authorizing Provider  amLODipine (NORVASC) 10 MG tablet Take 10 mg by mouth at bedtime.   Yes [provider]  carvedilol (COREG) 12.5 MG tablet Take 1 tablet (12.5 mg total) by mouth 2 (two) times daily with a meal. Patient taking differently: Take 12.5 mg by mouth See admin instructions. TWO TIMES A DAY, BUT HOLD ON DIALYSIS DAYS 07/20/16  Yes Maris Berger A, PA-C  docusate sodium (COLACE) 100 MG capsule Take 1 capsule (100 mg total) by mouth daily. To prevent constipation 09/06/15  Yes Little, Ambrose Finland, MD  fluocinonide cream (LIDEX) 0.05 % Apply 1 application topically 2 (two) times daily. Apply from neck down twice a day (provided by dermatology).   Yes [provider]  hydrALAZINE (APRESOLINE) 25 MG tablet Take 1 tablet (25 mg total) by mouth every 8 (eight) hours. Patient taking differently: Take 25 mg by mouth See admin instructions. EVERY 8 HOURS, BUT HOLD ON DIALYSIS DAYS 11/07/14  Yes [name], [name], MD  lamoTRIgine (LAMICTAL) 25 MG tablet Take 25 mg by mouth at bedtime. 02/17/17  Yes [provider]  lidocaine-prilocaine (EMLA) cream Apply 1 application topically as needed (prior to dialysis).    Yes [provider]  LORazepam (ATIVAN) 0.5 MG tablet Place 0.5 mg under the tongue every 4 (four) hours as needed (for anxiety or agitation).   Yes [provider]  morphine (ROXANOL) 20 MG/ML concentrated solution Take 5-10 mg by mouth See admin instructions. 5 mg every hour as needed for mild pain and 10 mg every hour as needed for moderate pain or dyspnea   Yes [provider]  multivitamin (RENA-VIT) TABS tablet Take 1 tablet by mouth at bedtime. 03/15/15  Yes Penny PiaVega, Orlando, MD  Nutritional Supplements (FEEDING SUPPLEMENT, NEPRO CARB STEADY,) LIQD  Take 237 mLs by mouth 3 (three) times daily between meals. 04/23/16  Yes Richarda OverlieAbrol, Nayana, MD  oxyCODONE-acetaminophen (PERCOCET/ROXICET) 5-325 MG tablet Take 1 tablet by mouth every 6 (six) hours as needed. Patient taking differently: Take 1 tablet by mouth every 6 (six) hours as needed (for pain).  07/20/16  Yes Maris Bergerrinh, Kimberly A, PA-C  polyethylene glycol (MIRALAX / GLYCOLAX) packet Take 17 g by mouth daily as needed for mild constipation. 04/23/16  Yes Richarda OverlieAbrol, Nayana, MD  sertraline (ZOLOFT) 50 MG tablet Take 1 tablet (50 mg total) by mouth daily. Patient must schedule and keep appointment for before any more refills. Patient taking differently: Take 50 mg by mouth daily.  01/16/16  Yes [name], [name], MD  ferrous sulfate 325 (65 FE) MG tablet Take 1 tablet (325 mg total) by mouth daily. Patient not taking: Reported on 02/26/2017 09/06/15   Little, Ambrose Finlandachel Morgan, MD  Nutritional Supplements (FEEDING SUPPLEMENT, NEPRO CARB STEADY,) LIQD Take 237 mLs by mouth 3 (three) times daily between meals. Patient not taking: Reported on 02/26/2017 07/20/16   Otis Peakrinh, Kimberly A, PA-C    Physical Exam:  Constitutional: Elderly female in no acute distress when still. Vitals:   02/26/17 2330 02/26/17 2345 02/27/17 0000 02/27/17 0015  BP: (!) 150/62 (!) 145/59 (!) 163/62 (!) 166/64  Pulse: 62 63 63 64  Resp: 15 20 16 18   Temp:      TempSrc:      SpO2: 97% 96% 96% 98%  Weight:      Height:       Eyes: PERRL, lids and conjunctivae normal ENMT: Mucous membranes are moist. Posterior pharynx clear of any exudate or lesions.  Neck: normal, supple, no masses, no thyromegaly Respiratory: clear to auscultation bilaterally, no wheezing, no crackles. Normal respiratory effort. No accessory muscle use.  Cardiovascular: Regular rate and rhythm, no murmurs / rubs / gallops. No extremity edema. 2+ pedal pulses. No carotid bruits.  Abdomen: no tenderness, no masses palpated. No hepatosplenomegaly. Bowel sounds positive.    Musculoskeletal: no clubbing / cyanosis. Patient in significant pain with any movement of the left lower extremity. Skin: no rashes, lesions, ulcers. No induration Neurologic: CN 2-12 grossly intact. Sensation intact, DTR normal. Patient able to move all 4 extremities.  Psychiatric: Patient alert and oriented to person. Intermittently appears lucid.   Labs on Admission: I have personally reviewed following labs and imaging studies  CBC:  Recent Labs Lab 02/26/17 2206  WBC 5.3  NEUTROABS 3.8  HGB 9.7*  HCT 31.5*  MCV 81.8  PLT 176   Basic Metabolic Panel:  Recent Labs Lab 02/26/17 2206  NA 132*  K 3.3*  CL 92*  CO2 29  GLUCOSE 104*  BUN 30*  CREATININE 6.07*  CALCIUM 8.1*   GFR: Estimated Creatinine Clearance: 4.6 mL/min (A) (by C-G formula based on SCr of 6.07 mg/dL (H)). Liver Function Tests: No results for input(s): AST, ALT, ALKPHOS, BILITOT, PROT, ALBUMIN in the last 168 hours. No results for input(s): LIPASE, AMYLASE in the last 168 hours. No results for input(s): AMMONIA in the last 168 hours. Coagulation Profile: No results for input(s): INR, PROTIME in the last 168 hours. Cardiac Enzymes: No results for input(s): CKTOTAL, CKMB, CKMBINDEX, TROPONINI in the last 168 hours. BNP (last 3 results) No results for input(s): PROBNP in the last 8760 hours. HbA1C: No results for input(s): HGBA1C in the last 72 hours. CBG: No results for input(s): GLUCAP in the last 168 hours. Lipid Profile: No results for input(s): CHOL, HDL, LDLCALC, TRIG, CHOLHDL, LDLDIRECT in the last 72 hours. Thyroid Function Tests: No results for input(s): TSH, T4TOTAL, FREET4, T3FREE, THYROIDAB in the last 72 hours. Anemia Panel: No results for input(s): VITAMINB12, FOLATE, FERRITIN, TIBC, IRON, RETICCTPCT in the last 72 hours. Urine analysis:    Component Value Date/Time   COLORURINE YELLOW 03/12/2015 1506   APPEARANCEUR TURBID (A) 03/12/2015 1506   LABSPEC 1.025 03/12/2015 1506    PHURINE 6.5 03/12/2015 1506   GLUCOSEU NEGATIVE 03/12/2015 1506   HGBUR MODERATE (A) 03/12/2015 1506   BILIRUBINUR SMALL (A) 03/12/2015 1506   KETONESUR NEGATIVE 03/12/2015 1506   PROTEINUR >300 (A) 03/12/2015 1506   UROBILINOGEN 0.2 03/12/2015 1506   NITRITE NEGATIVE 03/12/2015 1506   LEUKOCYTESUR LARGE (A) 03/12/2015 1506   Sepsis Labs: No results found for this or any previous visit (from the past 240 hour(s)).   Radiological Exams on Admission: Dg Chest 1 View  Result Date: 02/26/2017 CLINICAL DATA:  Unwitnessed fall yesterday.  Missed dialysis. EXAM: CHEST 1 VIEW COMPARISON:  [id number] FINDINGS: Stable cardiomegaly. There is atherosclerosis and tortuosity of the thoracic aorta. Increased interstitial opacities over baseline and peribronchial cuffing suspicious for mild pulmonary edema. Possible small left pleural effusion. No confluent airspace disease. No pneumothorax. Left axillary stent is again seen. No acute osseous abnormalities. IMPRESSION: 1. Mild pulmonary edema and possible left pleural effusion. 2. Stable cardiomegaly and tortuous atherosclerotic thoracic aorta. Electronically Signed   By: Rubye Oaks M.D.   On: 02/26/2017 23:02   Ct Pelvis Wo Contrast  Result Date: 02/26/2017 CLINICAL DATA:  Unwitnessed fall. Stated history of superior pubic ramus fracture. EXAM: CT PELVIS WITHOUT CONTRAST TECHNIQUE: Multidetector CT imaging of the pelvis was performed following the standard protocol without intravenous contrast. COMPARISON:  None. FINDINGS: Urinary Tract: Urinary bladder is nondistended. Distal ureters are decompressed. Bowel: Colonic diverticulosis throughout the entire visualized colon. No evidence of diverticulitis. No bowel obstruction. Vascular/Lymphatic: Advanced aortic atherosclerosis. No bulky adenopathy. Reproductive:  Uterus is surgically absent.  No adnexal mass. Other:  Mild generalized edema the pelvic fat. Musculoskeletal: Acute fracture of the left superior pubic  ramus at the acetabular junction, mildly displaced. There is an acute nondisplaced left inferior pubic ramus fracture. Right inferior pubis ramus fracture is likely remote with surrounding callus. Questionable nondisplaced sacral fracture on the left. Sacroiliac joints and pubic symphysis are congruent. Proximal femurs are intact. Hip joint spaces are maintained. There is stranding about the left pubic rami fractures without large volume hemorrhage. IMPRESSION: 1. Acute mildly displaced fracture of the left superior pubic ramus at the acetabular junction. Acute nondisplaced left inferior pubic ramus fracture. 2. Questionable nondisplaced left sacral fracture arm. 3.  Remote right inferior pubic ramus fracture with surrounding callus. 4. Aortic and branch atherosclerosis. Electronically Signed   By: Rubye Oaks M.D.   On: 02/26/2017 23:00    EKG: Independently reviewed. Sinus rhythm with what appears to be a sinus arrhythmia.  Assessment/Plan Fall with left hip pain 2/2 pubic ramus fractures. Acute. Patient with unwitnessed fall found to have pubic ramus fractures on CT scan of the pelvis. - Admit to MedSurg bed - neuro checks q 4hr overnight - Continue home morphine and oxycodone prn pain - PT/OT to eval and treat - Social work consult   ESRD on HD: Pt normally is a T/T/Sat dialysis patient, but missed her dialysis today secondary to the fall. Labs reveal sodium 132, potassium 3.3, BUN 30, creatinine 6. - Renal diet - Message left on nephrology consult line overnight for the patient needing dialysis in a.m.  Hypokalemia: acute. Initial potassium 3.3. - Give 20 mEq of potassium chloride 1 dose - Continue to monitor and replace as needed   Systolic and diastolic congestive heart failure:  Last EF 35% with grade 1 diastolic dysfunction in 09/2014. Chest x-ray showing mild pulmonary edema with  - Strict I&O   Anemia of chronic kidney disease: Patient's initial hemoglobin 9.7 which appears her  baseline.  - Continue to monitor   Essential hypertension - Continue Coreg, amlodipine, and hydralazine - Orders to hold Coreg and hydralazine on days of hemodialysis  Dementia with behavior disturbance - Continue Ativan prn anxiety/agitation and Lamictal at bedtime  DVT prophylaxis: SCDs  Code Status: DNR Family Communication: No family present at bedside  Disposition Plan: Likely discharge back to skilled nursing facility once medically stable  Consults called: Nephrology Admission status: Observation  Clydie Braun MD [facility] Pager [phone]   If 7PM-7AM, please contact night-coverage [email or web address] Password [facility]  02/27/2017, 12:59 AM

## 2017-02-27 NOTE — ED Notes (Addendum)
RN arrived to room to take to 6E to find patient lying in floor beside stretcher, PIV had been removed by patient, dried blood on arm from IV site; pt alert stating she wanted to go home RN cleaned blood from patient and restarted IV; placed patient in new gown, pt denies any new injuries R. Katrinka BlazingSmith, MD hospitalist notified at this time

## 2017-02-27 NOTE — Progress Notes (Addendum)
Patient was seen and examined at bedside. Admitted earlier today from nursing home. 83 year old female with history of hypertension, type 2 diabetes, chronic systolic heart failure, ESRD on hemodialysis TTS presented after a fall at nursing home sustaining a pelvic pain. In the ER patient was found to have acute pelvic fracture. Admitted for further evaluation -Nephrology consult requested for the management of ESRD -[facility] FOR THE EVALUATION OF ACUTE PELVIC Fracture  Patient reported her pain is better controlled. Patient denied headache, dizziness, chest pain or shortness of breath. She looks comfortable. Continue current medical management. Labs acceptable and consistent with ESRD.  DVT prophylaxis with SCD. Pt is HIT ab positive Patient is DO NOT RESUSCITATE. Social worker consult for skilled nursing facility discharge.

## 2017-02-27 NOTE — Progress Notes (Signed)
Received patient from ED.  Nurse from ED called as patient was en route and stated that the patient had experienced an unwitnessed fall in ED since report had been given.  Reported that the IV had been replaced and the patient appeared to have no injuries.  Reported that physician and family had not been notified.  I requested that she notify the physician.  I will request a low bed and floor pads for the patient and notify family.    The patient has been settled into bed and vitals are stable.  No visible injuries noted.  Skin is intact and free from rashes or wounds.  Bed alarm on and safety plan discussed with patient.

## 2017-02-28 ENCOUNTER — Telehealth (INDEPENDENT_AMBULATORY_CARE_PROVIDER_SITE_OTHER): Payer: Self-pay | Admitting: *Deleted

## 2017-02-28 DIAGNOSIS — S32592A Other specified fracture of left pubis, initial encounter for closed fracture: Secondary | ICD-10-CM | POA: Diagnosis not present

## 2017-02-28 DIAGNOSIS — Z992 Dependence on renal dialysis: Secondary | ICD-10-CM | POA: Diagnosis not present

## 2017-02-28 DIAGNOSIS — D638 Anemia in other chronic diseases classified elsewhere: Secondary | ICD-10-CM | POA: Diagnosis not present

## 2017-02-28 DIAGNOSIS — W19XXXD Unspecified fall, subsequent encounter: Secondary | ICD-10-CM | POA: Diagnosis not present

## 2017-02-28 DIAGNOSIS — N186 End stage renal disease: Secondary | ICD-10-CM | POA: Diagnosis not present

## 2017-02-28 DIAGNOSIS — I10 Essential (primary) hypertension: Secondary | ICD-10-CM | POA: Diagnosis not present

## 2017-02-28 DIAGNOSIS — S32512A Fracture of superior rim of left pubis, initial encounter for closed fracture: Secondary | ICD-10-CM

## 2017-02-28 DIAGNOSIS — S32502A Unspecified fracture of left pubis, initial encounter for closed fracture: Secondary | ICD-10-CM | POA: Diagnosis not present

## 2017-02-28 LAB — RENAL FUNCTION PANEL
ALBUMIN: 2 g/dL — AB (ref 3.5–5.0)
ALBUMIN: 2.1 g/dL — AB (ref 3.5–5.0)
Anion gap: 11 (ref 5–15)
Anion gap: 7 (ref 5–15)
BUN: 41 mg/dL — AB (ref 6–20)
BUN: 16 mg/dL (ref 6–20)
CALCIUM: 7.5 mg/dL — AB (ref 8.9–10.3)
CO2: 27 mmol/L (ref 22–32)
CO2: 32 mmol/L (ref 22–32)
CREATININE: 7.73 mg/dL — AB (ref 0.44–1.00)
CREATININE: 3.55 mg/dL — AB (ref 0.44–1.00)
Calcium: 7.7 mg/dL — ABNORMAL LOW (ref 8.9–10.3)
Chloride: 92 mmol/L — ABNORMAL LOW (ref 101–111)
Chloride: 93 mmol/L — ABNORMAL LOW (ref 101–111)
GFR calc Af Amer: 5 mL/min — ABNORMAL LOW (ref >60)
GFR calc Af Amer: 13 mL/min — ABNORMAL LOW (ref >60)
GFR, EST NON AFRICAN AMERICAN: 4 mL/min — AB (ref >60)
GFR, EST NON AFRICAN AMERICAN: 11 mL/min — AB (ref >60)
Glucose, Bld: 61 mg/dL — ABNORMAL LOW (ref 65–99)
Glucose, Bld: 77 mg/dL (ref 65–99)
PHOSPHORUS: 5.8 mg/dL — AB (ref 2.5–4.6)
PHOSPHORUS: 3.1 mg/dL (ref 2.5–4.6)
Potassium: 4.4 mmol/L (ref 3.5–5.1)
Potassium: 3.6 mmol/L (ref 3.5–5.1)
Sodium: 130 mmol/L — ABNORMAL LOW (ref 135–145)
Sodium: 132 mmol/L — ABNORMAL LOW (ref 135–145)

## 2017-02-28 LAB — CBC
HCT: 28.6 % — ABNORMAL LOW (ref 36.0–46.0)
HCT: 29.9 % — ABNORMAL LOW (ref 36.0–46.0)
Hemoglobin: 8.8 g/dL — ABNORMAL LOW (ref 12.0–15.0)
Hemoglobin: 9 g/dL — ABNORMAL LOW (ref 12.0–15.0)
MCH: 24.7 pg — ABNORMAL LOW (ref 26.0–34.0)
MCH: 24.4 pg — ABNORMAL LOW (ref 26.0–34.0)
MCHC: 30.8 g/dL (ref 30.0–36.0)
MCHC: 30.1 g/dL (ref 30.0–36.0)
MCV: 80.3 fL (ref 78.0–100.0)
MCV: 81 fL (ref 78.0–100.0)
PLATELETS: 194 10*3/uL (ref 150–400)
PLATELETS: 190 10*3/uL (ref 150–400)
RBC: 3.56 MIL/uL — ABNORMAL LOW (ref 3.87–5.11)
RBC: 3.69 MIL/uL — ABNORMAL LOW (ref 3.87–5.11)
RDW: 16.3 % — AB (ref 11.5–15.5)
RDW: 16.4 % — ABNORMAL HIGH (ref 11.5–15.5)
WBC: 5.7 10*3/uL (ref 4.0–10.5)
WBC: 5 10*3/uL (ref 4.0–10.5)

## 2017-02-28 MED ORDER — DOXERCALCIFEROL 4 MCG/2ML IV SOLN
INTRAVENOUS | Status: AC
  Administered 2017-02-28: 1 ug via INTRAVENOUS
  Filled 2017-02-28: qty 2

## 2017-02-28 MED ORDER — OXYCODONE-ACETAMINOPHEN 5-325 MG PO TABS: 1.0000 | ORAL_TABLET | Freq: Four times a day (QID) | ORAL | 0 refills | Status: AC | PRN

## 2017-02-28 MED ORDER — NEPRO/CARBSTEADY PO LIQD
237.0000 mL | Freq: Two times a day (BID) | ORAL | Status: DC
  Administered 2017-02-28 – 2017-03-01 (×2): 237 mL via ORAL

## 2017-02-28 MED ORDER — PENTAFLUOROPROP-TETRAFLUOROETH EX AERO: 1.0000 "application " | INHALATION_SPRAY | CUTANEOUS | Status: DC | PRN

## 2017-02-28 MED ORDER — OXYCODONE-ACETAMINOPHEN 5-325 MG PO TABS
ORAL_TABLET | ORAL | Status: AC
  Administered 2017-02-28: 1 via ORAL
  Filled 2017-02-28: qty 1

## 2017-02-28 MED ORDER — LIDOCAINE-PRILOCAINE 2.5-2.5 % EX CREA: 1.0000 "application " | TOPICAL_CREAM | CUTANEOUS | Status: DC | PRN

## 2017-02-28 MED ORDER — SODIUM CHLORIDE 0.9 % IV SOLN: 100.0000 mL | INTRAVENOUS | Status: DC | PRN

## 2017-02-28 MED ORDER — LIDOCAINE HCL (PF) 1 % IJ SOLN: 5.0000 mL | INTRAMUSCULAR | Status: DC | PRN

## 2017-02-28 NOTE — Care Management Obs Status (Signed)
MEDICARE OBSERVATION STATUS NOTIFICATION   Patient Details  Name: Natalie Wolfe MRN: [id number] Date of Birth: 1930-03-19   Medicare Observation Status Notification Given:  Yes    Nakkia Mackiewicz, Annamarie MajorCheryl U, RN 02/28/2017, 12:44 PM

## 2017-02-28 NOTE — Consult Note (Signed)
Reason for Consult: Left hip pain Referring Physician: Dr Carolin Sicks  Natalie Wolfe is an 83 y.o. female.  HPI: Natalie Wolfe is an 83 year old patient who sustained a mechanical fall prior to admission.  Natalie Wolfe reports left hip pain.  Natalie Wolfe denies any other upper extremity orthopedic complaints.  He scan of the pelvis shows healed right rami fractures and acute new inferior and superior rami fractures on the left-hand side.  The hip itself is intact.  The patient is a limited household ambulator with assist.  Patient states Natalie Wolfe does not get up and down much at all.  Past Medical History:  Diagnosis Date  . Anemia, unspecified   . Cataract   . CHF (congestive heart failure) ([place])   . Chronic kidney disease, stage II (mild)    [facility]  . Dementia    forgetful, .  . Failure to thrive in adult 03/09/2015  . Insomnia, unspecified   . Memory loss 02/10/2015  . Nocturia   . Obesity, unspecified   . Other and unspecified hyperlipidemia   . Other anxiety states   . Other atopic dermatitis and related conditions   . Pain in joint, lower leg   . PVD (peripheral vascular disease) ([place])   . Rash and other nonspecific skin eruption   . Routine general medical examination at a health care facility   . Thrombocytopenia ([place])   . Type II or unspecified type diabetes mellitus with renal manifestations, not stated as uncontrolled(250.40)    Cecille Rubin, nurse at [facility] states they are not aware that pt is diabetic (as 07/19/16)  . Unspecified essential hypertension   . Unspecified vitamin D deficiency   . Urinary frequency     Past Surgical History:  Procedure Laterality Date  . ABDOMINAL HYSTERECTOMY  1970  . AV FISTULA PLACEMENT Left 09/06/2014   Procedure: CREATION OF LEFT UPPER ARM ARTERIOVENOUS (AV) FISTULA ;  Surgeon: Mal Misty, MD;  Location: [place];  Service: Vascular;  Laterality: Left;  . BREAST SURGERY  1979   nodule removed  . EYE SURGERY   2004   catarcts removed from right eye.  Marland Kitchen FALSE ANEURYSM REPAIR Left 03/07/2015   Procedure: REPAIR PSEUDOANEURYSMS OF LEFT ARM ARTERIOVENOUS FISTULA;  Surgeon: Angelia Mould, MD;  Location: [place];  Service: Vascular;  Laterality: Left;  . INSERTION OF DIALYSIS CATHETER N/A 09/06/2014   Procedure: INSERTION OF DIALYSIS CATHETER RIGHT INTERNAL JUGULAR VEIN;  Surgeon: Mal Misty, MD;  Location: [place];  Service: Vascular;  Laterality: N/A;  . LEFT HEART CATHETERIZATION WITH CORONARY ANGIOGRAM N/A 09/17/2014   Procedure: LEFT HEART CATHETERIZATION WITH CORONARY ANGIOGRAM;  Surgeon: Troy Sine, MD;  Location[facility];  Service: Cardiovascular;  Laterality: N/A;  . LIGATION OF COMPETING BRANCHES OF ARTERIOVENOUS FISTULA Left 09/06/2014   Procedure: LIGATION OF COMPETING BRANCHES OF ARTERIOVENOUS FISTULA;  Surgeon: Mal Misty, MD;  Location: [place];  Service: Vascular;  Laterality: Left;  Marland Kitchen MULTIPLE TOOTH EXTRACTIONS  09/2013   Remonve remaining 3 teeth, no with dentures   . PATCH ANGIOPLASTY Left 07/20/2016   Procedure: PATCH ANGIOPLASTY LEFT UPPER ARM USING XENOSURE BIOLOGIC PATCH;  Surgeon: Angelia Mould, MD;  Location: [place];  Service: Vascular;  Laterality: Left;  . REVISON OF ARTERIOVENOUS FISTULA Left 07/20/2016   Procedure: REPAIR OF ARTERIOVENOUS FISTULA PSEUDOANEURYSM LEFT UPPER ARM;  Surgeon: Angelia Mould, MD;  Location: [place];  Service: Vascular;  Laterality: Left;    Family History  Problem Relation Age of Onset  . Diabetes Mother     Social History:  reports that Natalie Wolfe quit smoking about 16 years ago. Her smoking use included Cigarettes. Natalie Wolfe has never used smokeless tobacco. Natalie Wolfe reports that Natalie Wolfe does not drink alcohol or use drugs.  Allergies:  Allergies  Allergen Reactions  . Heparin Other (See Comments)    POSITIVE Hep Induced Plt Ab and SRA 09/12/2014 and 09/17/2014 (2/9 mis-reported as negative by lab).  Pt rec'd SQ heparin and heparin w/ HD  prior to + results.    Medications:  Prior to Admission:  Prescriptions Prior to Admission  Medication Sig Dispense Refill Last Dose  . amLODipine (NORVASC) 10 MG tablet Take 10 mg by mouth at bedtime.   02/26/2017 at 2100  . carvedilol (COREG) 12.5 MG tablet Take 1 tablet (12.5 mg total) by mouth 2 (two) times daily with a meal. (Patient taking differently: Take 12.5 mg by mouth See admin instructions. TWO TIMES A DAY, BUT HOLD ON DIALYSIS DAYS)   02/25/2017 at 2100  . docusate sodium (COLACE) 100 MG capsule Take 1 capsule (100 mg total) by mouth daily. To prevent constipation 30 capsule 0 02/26/2017 at 0900  . fluocinonide cream (LIDEX) 0.56 % Apply 1 application topically 2 (two) times daily. Apply from neck down twice a day (provided by dermatology).   02/26/2017 at 2100  . hydrALAZINE (APRESOLINE) 25 MG tablet Take 1 tablet (25 mg total) by mouth every 8 (eight) hours. (Patient taking differently: Take 25 mg by mouth See admin instructions. EVERY 8 HOURS, BUT HOLD ON DIALYSIS DAYS) 270 tablet 3 02/26/2017 at 2200  . lamoTRIgine (LAMICTAL) 25 MG tablet Take 25 mg by mouth at bedtime.   02/26/2017 at 2100  . lidocaine-prilocaine (EMLA) cream Apply 1 application topically as needed (prior to dialysis).    PRN at PRN  . LORazepam (ATIVAN) 0.5 MG tablet Place 0.5 mg under the tongue every 4 (four) hours as needed (for anxiety or agitation).   PRN at PRN  . morphine (ROXANOL) 20 MG/ML concentrated solution Take 5-10 mg by mouth See admin instructions. 5 mg every hour as needed for mild pain and 10 mg every hour as needed for moderate pain or dyspnea   02/26/2017 at 2000  . multivitamin (RENA-VIT) TABS tablet Take 1 tablet by mouth at bedtime. 30 each 0 02/26/2017 at 2100  . Nutritional Supplements (FEEDING SUPPLEMENT, NEPRO CARB STEADY,) LIQD Take 237 mLs by mouth 3 (three) times daily between meals. 30 Can 2 02/26/2017 at 2000  . polyethylene glycol (MIRALAX / GLYCOLAX) packet Take 17 g by mouth daily as  needed for mild constipation. 14 each 0 PRN at PRN  . sertraline (ZOLOFT) 50 MG tablet Take 1 tablet (50 mg total) by mouth daily. Patient must schedule and keep appointment for before any more refills. (Patient taking differently: Take 50 mg by mouth daily. ) 30 tablet 0 02/26/2017 at 0900  . [DISCONTINUED] oxyCODONE-acetaminophen (PERCOCET/ROXICET) 5-325 MG tablet Take 1 tablet by mouth every 6 (six) hours as needed. (Patient taking differently: Take 1 tablet by mouth every 6 (six) hours as needed (for pain). ) 6 tablet 0 02/26/2017 at 1730  . ferrous sulfate 325 (65 FE) MG tablet Take 1 tablet (325 mg total) by mouth daily. (Patient not taking: Reported on 02/26/2017) 30 tablet 0 Not Taking at Unknown time  . Nutritional Supplements (FEEDING SUPPLEMENT, NEPRO CARB STEADY,) LIQD Take 237 mLs by mouth 3 (three) times daily between meals. (Patient not  taking: Reported on 02/26/2017)   Not Taking at Unknown time    Results for orders placed or performed during the hospital encounter of 02/26/17 (from the past 48 hour(s))  MRSA PCR Screening     Status: None   Collection Time: 02/27/17  6:58 AM  Result Value Ref Range   MRSA by PCR NEGATIVE NEGATIVE    Comment:        The GeneXpert MRSA Assay (FDA approved for NASAL specimens only), is one component of a comprehensive MRSA colonization surveillance program. It is not intended to diagnose MRSA infection nor to guide or monitor treatment for MRSA infections.   Renal function panel     Status: Abnormal   Collection Time: 02/27/17  7:16 AM  Result Value Ref Range   Sodium 132 (L) 135 - 145 mmol/L   Potassium 3.4 (L) 3.5 - 5.1 mmol/L   Chloride 92 (L) 101 - 111 mmol/L   CO2 31 22 - 32 mmol/L   Glucose, Bld 106 (H) 65 - 99 mg/dL   BUN 33 (H) 6 - 20 mg/dL   Creatinine, Ser 6.54 (H) 0.44 - 1.00 mg/dL   Calcium 8.2 (L) 8.9 - 10.3 mg/dL   Phosphorus 5.1 (H) 2.5 - 4.6 mg/dL   Albumin 2.1 (L) 3.5 - 5.0 g/dL   GFR calc non Af Amer 5 (L) >60 mL/min    GFR calc Af Amer 6 (L) >60 mL/min    Comment: (NOTE) The eGFR has been calculated using the CKD EPI equation. This calculation has not been validated in all clinical situations. eGFR's persistently <60 mL/min signify possible Chronic Kidney Disease.    Anion gap 9 5 - 15  CBC     Status: Abnormal   Collection Time: 02/28/17  7:12 AM  Result Value Ref Range   WBC 5.7 4.0 - 10.5 K/uL   RBC 3.56 (L) 3.87 - 5.11 MIL/uL   Hemoglobin 8.8 (L) 12.0 - 15.0 g/dL   HCT 28.6 (L) 36.0 - 46.0 %   MCV 80.3 78.0 - 100.0 fL   MCH 24.7 (L) 26.0 - 34.0 pg   MCHC 30.8 30.0 - 36.0 g/dL   RDW 16.3 (H) 11.5 - 15.5 %   Platelets 194 150 - 400 K/uL  Renal function panel     Status: Abnormal   Collection Time: 02/28/17  7:12 AM  Result Value Ref Range   Sodium 130 (L) 135 - 145 mmol/L   Potassium 4.4 3.5 - 5.1 mmol/L   Chloride 92 (L) 101 - 111 mmol/L   CO2 27 22 - 32 mmol/L   Glucose, Bld 61 (L) 65 - 99 mg/dL   BUN 41 (H) 6 - 20 mg/dL   Creatinine, Ser 7.73 (H) 0.44 - 1.00 mg/dL   Calcium 7.5 (L) 8.9 - 10.3 mg/dL   Phosphorus 5.8 (H) 2.5 - 4.6 mg/dL   Albumin 2.0 (L) 3.5 - 5.0 g/dL   GFR calc non Af Amer 4 (L) >60 mL/min   GFR calc Af Amer 5 (L) >60 mL/min    Comment: (NOTE) The eGFR has been calculated using the CKD EPI equation. This calculation has not been validated in all clinical situations. eGFR's persistently <60 mL/min signify possible Chronic Kidney Disease.    Anion gap 11 5 - 15  CBC     Status: Abnormal   Collection Time: 02/28/17  8:36 PM  Result Value Ref Range   WBC 5.0 4.0 - 10.5 K/uL   RBC 3.69 (L) 3.87 -  5.11 MIL/uL   Hemoglobin 9.0 (L) 12.0 - 15.0 g/dL   HCT 29.9 (L) 36.0 - 46.0 %   MCV 81.0 78.0 - 100.0 fL   MCH 24.4 (L) 26.0 - 34.0 pg   MCHC 30.1 30.0 - 36.0 g/dL   RDW 16.4 (H) 11.5 - 15.5 %   Platelets 190 150 - 400 K/uL  Renal function panel     Status: Abnormal   Collection Time: 02/28/17  8:36 PM  Result Value Ref Range   Sodium 132 (L) 135 - 145 mmol/L    Potassium 3.6 3.5 - 5.1 mmol/L    Comment: DELTA CHECK NOTED   Chloride 93 (L) 101 - 111 mmol/L   CO2 32 22 - 32 mmol/L   Glucose, Bld 77 65 - 99 mg/dL   BUN 16 6 - 20 mg/dL   Creatinine, Ser 3.55 (H) 0.44 - 1.00 mg/dL    Comment: DELTA CHECK NOTED   Calcium 7.7 (L) 8.9 - 10.3 mg/dL   Phosphorus 3.1 2.5 - 4.6 mg/dL   Albumin 2.1 (L) 3.5 - 5.0 g/dL   GFR calc non Af Amer 11 (L) >60 mL/min   GFR calc Af Amer 13 (L) >60 mL/min    Comment: (NOTE) The eGFR has been calculated using the CKD EPI equation. This calculation has not been validated in all clinical situations. eGFR's persistently <60 mL/min signify possible Chronic Kidney Disease.    Anion gap 7 5 - 15    Dg Chest 1 View  Result Date: 02/26/2017 CLINICAL DATA:  Unwitnessed fall yesterday.  Missed dialysis. EXAM: CHEST 1 VIEW COMPARISON:  1047 FINDINGS: Stable cardiomegaly. There is atherosclerosis and tortuosity of the thoracic aorta. Increased interstitial opacities over baseline and peribronchial cuffing suspicious for mild pulmonary edema. Possible small left pleural effusion. No confluent airspace disease. No pneumothorax. Left axillary stent is again seen. No acute osseous abnormalities. IMPRESSION: 1. Mild pulmonary edema and possible left pleural effusion. 2. Stable cardiomegaly and tortuous atherosclerotic thoracic aorta. Electronically Signed   By: Jeb Levering M.D.   On: 02/26/2017 23:02   Ct Pelvis Wo Contrast  Result Date: 02/26/2017 CLINICAL DATA:  Unwitnessed fall. Stated history of superior pubic ramus fracture. EXAM: CT PELVIS WITHOUT CONTRAST TECHNIQUE: Multidetector CT imaging of the pelvis was performed following the standard protocol without intravenous contrast. COMPARISON:  None. FINDINGS: Urinary Tract: Urinary bladder is nondistended. Distal ureters are decompressed. Bowel: Colonic diverticulosis throughout the entire visualized colon. No evidence of diverticulitis. No bowel obstruction.  Vascular/Lymphatic: Advanced aortic atherosclerosis. No bulky adenopathy. Reproductive:  Uterus is surgically absent.  No adnexal mass. Other:  Mild generalized edema the pelvic fat. Musculoskeletal: Acute fracture of the left superior pubic ramus at the acetabular junction, mildly displaced. There is an acute nondisplaced left inferior pubic ramus fracture. Right inferior pubis ramus fracture is likely remote with surrounding callus. Questionable nondisplaced sacral fracture on the left. Sacroiliac joints and pubic symphysis are congruent. Proximal femurs are intact. Hip joint spaces are maintained. There is stranding about the left pubic rami fractures without large volume hemorrhage. IMPRESSION: 1. Acute mildly displaced fracture of the left superior pubic ramus at the acetabular junction. Acute nondisplaced left inferior pubic ramus fracture. 2. Questionable nondisplaced left sacral fracture arm. 3. Remote right inferior pubic ramus fracture with surrounding callus. 4. Aortic and branch atherosclerosis. Electronically Signed   By: Jeb Levering M.D.   On: 02/26/2017 23:00    Review of Systems  Musculoskeletal: Positive for joint pain.  All other  systems reviewed and are negative.  Blood pressure (!) 167/43, pulse 61, temperature 98.6 F (37 C), temperature source Oral, resp. rate 20, height 5' (1.524 m), weight 108 lb 3.9 oz (49.1 kg), SpO2 95 %. Physical Exam  Constitutional: Natalie Wolfe appears well-developed.  HENT:  Head: Normocephalic.  Eyes: Pupils are equal, round, and reactive to light.  Neck: Normal range of motion.  Cardiovascular: Normal rate.   Respiratory: Effort normal.  Neurological: Natalie Wolfe is alert.  Skin: Skin is warm.  Psychiatric: Natalie Wolfe has a normal mood and affect.  Examination of bilateral lower chemise demonstrates hip and groin pain with internal/external rotation of the left leg but not the right leg.  When I move the right leg Natalie Wolfe does report some referred pain ever to that  left hand pelvic side.  Ankle dorsiflexion plantar flexion is intact.  Both feet are warm and perfused.  There is no knee effusion on either side.  Assessment/Plan: Impression is healed right rami fractures which are not symptomatic.  New left inferior and superior rami fractures which are symptomatic.  Plan is to mobilize partial weightbearing as tolerated for 3 weeks then weightbearing as tolerated for the next 3 weeks on the left-hand side.  This may be difficult as the patient was a limited ambulator prior to her injury.  I'll see her back in 3 weeks for clinical recheck.  In the hospital I will request physical therapy evaluation and treatment.  Landry Dyke Dean 02/28/2017, 10:37 PM

## 2017-02-28 NOTE — Clinical Social Work Note (Signed)
Clinical Social Work Assessment  Patient Details  Name: Natalie Wolfe MRN: [id number] Date of Birth: [birth year]  Date of referral:  02/27/17               Reason for consult:  Facility Placement (Patient from [facility])                Permission sought to share information with:  Family Supports Permission granted to share information::  Yes, Verbal Permission Granted  Name::     Natalie Wolfe  Agency::     Relationship::  Contractor Information:  [phone] (home), [phone] (work), [phone] (mobile)  Housing/Transportation Living arrangements for the past 2 months:  Skilled Holiday representative (Patient LTC resident of [place]) Source of Information:  Patient, Other (Comment Required) (Chart) Patient Interpreter Needed:  None Criminal Activity/Legal Involvement Pertinent to Current Situation/Hospitalization:  No - Comment as needed Significant Relationships:  Other Family Members (Patient has 2 relatives listed as contact that she identified as cousins) Lives with:  Facility Resident [facility]) Do you feel safe going back to the place where you live?  Yes Need for family participation in patient care:  Yes (Comment)  Care giving concerns:  Patient expressed no concerns regarding her care at [facility].  Social Worker assessment / plan:  CSW talked with patient at the bedside regarding discharge disposition and confirmed her plan to return to [facility]. Patient commented that none of her family can care for her because she likes to move around and does so at the facility, and this was discussed. [name] provided CSW with some history: she is an only child and was brought back to [place]. from [place], [place] by relatives after her mother died last year. [name] reported that she is an only child and a native of [place] After her father died (she was 7), her mother moved to [place] to work to provide for  her, and she remained  in [place] with her grandmother until the age of 10 when her mom took her to [place].  Patient jokingly commented that while in [place] she married a "dumb [place]".  CSW talked with Velna Hatchet, admissions director regarding patient's intent to return and Velna Hatchet advised that patient is LTC at their facility. A check of patient's chart indicates that she was admitted to [facility] 04/23/16.  Employment status:  Retired Health and safety inspector:  Armed forces operational officer, Medicaid In [place] PT Recommendations:  Skilled Nursing Facility Information / Referral to community resources:  Skilled Nursing Facility (None needed or requested as patient from SNF)  Patient/Family's Response to care:  [name] expressed no concerns regarding her care during hospitalization.  Patient/Family's Understanding of and Emotional Response to Diagnosis, Current Treatment, and Prognosis:  CSW talked with patient regarding her dialysis treatment and explained that she will be taken to HD in a chair to assure she can tolerate her treatment due to the pelvic fracture, and patient nodded understanding.   Emotional Assessment Appearance:  Appears stated age, Appears younger than stated age Attitude/Demeanor/Rapport:  Other (Appropriate) Affect (typically observed):  Appropriate, Pleasant Orientation:  Oriented to Self, Oriented to Place, Oriented to Situation Alcohol / Substance use:  Tobacco Use, Alcohol Use, Illicit Drugs (Patient reported that she quit smoking 16 years ago and does not drink or use illicit drugs) Psych involvement (Current and /or in the community):  No (Comment)  Discharge Needs  Concerns to be addressed:  Discharge Planning Concerns Readmission within the  last 30 days:  No Current discharge risk:  None Barriers to Discharge:  No Barriers Identified   Cristobal GoldmannCrawford, Romelle Muldoon Bradley, LCSW 02/28/2017, 4:35 PM

## 2017-02-28 NOTE — Telephone Encounter (Signed)
Called s/w Dr August Saucerean and made him aware. He never received information on patient yesterday.

## 2017-02-28 NOTE — Progress Notes (Signed)
Physical Therapy Treatment Patient Details Name: Natalie Wolfe MRN: [id number] DOB: November 24, 1929 Today's Date: 02/28/2017    History of Present Illness Patient presents to the hospital with a L pubic ramus fracture. she has an ortho cosult scheudled but there are no bed rest orders and therapy found her in a chair requesting to get back to bed. PMH includes: HTN, DMII, thrombocytopenia, rash, PVD, anxiety, emdentia, anemia, CHF, CKD and insomnia     PT Comments    Pt is up to chair with pivot after attempting to step with walker.  Too painful to follow through, so pt agreed to leg PT assist her to chair.  Her plan is still SNF based on active function and will expect to progress with standing and controlling wbing on LLE to step to chair as she is able.  Pt's family is supportive and stayed to encourage her to eat her meal which was just off to the side when PT arrived.  Acute therapy as above, with focus on standing and strength.   Follow Up Recommendations  SNF     Equipment Recommendations  None recommended by PT    Recommendations for Other Services       Precautions / Restrictions Precautions Precautions: Fall Restrictions Weight Bearing Restrictions: Yes LLE Weight Bearing: Partial weight bearing LLE Partial Weight Bearing Percentage or Pounds: 50%    Mobility  Bed Mobility Overal bed mobility: Needs Assistance Bed Mobility: Supine to Sit     Supine to sit: Mod assist     General bed mobility comments: used bed pad to scoot out to EOB with trunk support  Transfers Overall transfer level: Needs assistance Equipment used: Rolling walker (2 wheeled);1 person hand held assist Transfers: Sit to/from [employer]Stand;Stand Pivot Transfers Sit to Stand: Mod assist Stand pivot transfers: Mod assist       General transfer comment: mod assist to pivot on RLE to chair  Ambulation/Gait             General Gait Details: unable due to pain in standing   Stairs             Wheelchair Mobility    Modified Rankin (Stroke Patients Only)       Balance Overall balance assessment: Needs assistance Sitting-balance support: Feet supported Sitting balance-Leahy Scale: Good     Standing balance support: Bilateral upper extremity supported Standing balance-Leahy Scale: Poor Standing balance comment: pain limits standing and taking a step                            Cognition Arousal/Alertness: Awake/alert Behavior During Therapy: WFL for tasks assessed/performed Overall Cognitive Status: History of cognitive impairments - at baseline                                 General Comments: able to follow instructions for therapy      Exercises      General Comments General comments (skin integrity, edema, etc.): Pt has family in to visit, has supportive environment to get OOB      Pertinent Vitals/Pain Pain Assessment: Faces Faces Pain Scale: Hurts whole lot Pain Location: L hip with WB Pain Descriptors / Indicators: Aching;Grimacing Pain Intervention(s): Monitored during session;Premedicated before session;Repositioned    Home Living                      Prior Function  PT Goals (current goals can now be found in the care plan section) Acute Rehab PT Goals Patient Stated Goal: none noted Progress towards PT goals: Progressing toward goals    Frequency    Min 3X/week      PT Plan Current plan remains appropriate    Co-evaluation              AM-PAC PT "6 Clicks" Daily Activity  Outcome Measure  Difficulty turning over in bed (including adjusting bedclothes, sheets and blankets)?: Total Difficulty moving from lying on back to sitting on the side of the bed? : Total Difficulty sitting down on and standing up from a chair with arms (e.g., wheelchair, bedside commode, etc,.)?: Total Help needed moving to and from a bed to chair (including a wheelchair)?: A Lot Help needed  walking in hospital room?: Total Help needed climbing 3-5 steps with a railing? : Total 6 Click Score: 7    End of Session Equipment Utilized During Treatment: Gait belt Activity Tolerance: Patient limited by pain Patient left: in chair;with call bell/phone within reach;with chair alarm set;with family/visitor present Nurse Communication: Mobility status PT Visit Diagnosis: Repeated falls (R29.6);Unsteadiness on feet (R26.81);Pain Pain - Right/Left: Left Pain - part of body: Hip     Time: 0981-1914 PT Time Calculation (min) (ACUTE ONLY): 20 min  Charges:  $Therapeutic Activity: 8-22 mins                    G Codes:  Functional Assessment Tool Used: AM-PAC 6 Clicks Basic Mobility;Clinical judgement    Ivar Drape 02/28/2017, 4:17 PM   Samul Dada, PT MS Acute Rehab Dept. Number: [facility] [id number] and [facility] [phone]

## 2017-02-28 NOTE — Progress Notes (Signed)
PT Cancellation Note  Patient Details Name: Natalie Wolfe MRN: [id number] DOB: 23-Dec-1929   Cancelled Treatment:    Reason Eval/Treat Not Completed: Medical issues which prohibited therapy.  Pt is awaiting an orthopedic consult and has extremely low BS, other labs and will ck later as pt and time allow.   Ivar DrapeRuth E Ziyad Dyar 02/28/2017, 11:36 AM   Samul Dadauth Zharia Conrow, PT MS Acute Rehab Dept. Number: [facility] [phone] and [facility] [phone]

## 2017-02-28 NOTE — Progress Notes (Signed)
Initial Nutrition Assessment  DOCUMENTATION CODES:   Severe malnutrition in context of chronic illness  INTERVENTION:   Nepro Shake po BID, each supplement provides 425 kcal and 19 grams protein  Magic cup BID with meals, each supplement provides 290 kcal and 9 grams of protein  If po intake remains inadequate, recommend liberalizing diet  Continue Rena-Vit  NUTRITION DIAGNOSIS:   Malnutrition (Severe) related to chronic illness (ESRD on HD, CHF, dementia) as evidenced by severe depletion of muscle mass, severe depletion of body fat.  GOAL:   Patient will meet greater than or equal to 90% of their needs  MONITOR:   PO intake, Supplement acceptance, Labs, Weight trends  REASON FOR ASSESSMENT:   Malnutrition Screening Tool    ASSESSMENT:   83 yo female admitted post unwitnessed fall withwith acute pelvic fracture. Pt with hx of HTN, DM, CHF EF 35%, ESRD on HD, dementia  Pt reports 50 pound wt loss in 3 months with very poor appetite. Pt reports she has not really been eating any meals, mostly just drinking Nepro shakes (1-2 per day). Per weight encounters, pt with no wt loss over the past 2 years if current wt accurate.   Pt did not eat breakfast this AM, missed due to HD but then did not want anything upon return. Pt agreeable to eating some pudding and a Nepro  Nutrition-Focused physical exam completed. Findings are severe fat depletion, moderate to severe muscle depletion, and no edema.   Labs: sodium 130, phosphorus 5.8 (H), potassium wdl, corrected calcium 9.3 (albumin 2.0) Meds:   Diet Order:  Diet renal/carb modified with fluid restriction Diet-HS Snack? Nothing; Room service appropriate? Yes; Fluid consistency: Thin  Skin:  Reviewed, no issues  Last BM:  02/26/17  Height:   Ht Readings from Last 1 Encounters:  02/27/17 5' (1.524 m)    Weight:   Wt Readings from Last 1 Encounters:  02/28/17 107 lb 9.4 oz (48.8 kg)    BMI:  Body mass index is 21.01  kg/m.  Estimated Nutritional Needs:   Kcal:  1470-1715 kcals   Protein:  74-98 g  Fluid:  1000 mL plus UOP  EDUCATION NEEDS:   No education needs identified at this time  Romelle StarcherCate Matsue Strom MS, RD, LDN [phone] Pager  [phone] Weekend/On-Call Pager

## 2017-02-28 NOTE — Progress Notes (Signed)
Patients B/P is 148/47 and HR is 54. MD notified X2. MD stated to hold coreg. Will continue to monitor.

## 2017-02-28 NOTE — Telephone Encounter (Signed)
I received call from answering service for Dr. Ronalee BeltsBhandari for a STAT CONSULT on this pt stating needing to speak to someone ASAP, I saw we were not the [facility] so I called him back to advise him he needed to call [facility] and he had informed me that this was a STAT from yesterday and that he had called and spoke with Dr. Otelia SergeantNitka but he advised them he was not the on call dr but would get the message to Dr. August Saucerean. Dr. Ronalee BeltsBhandari states no one has called or came by to see this pt and would like a call back ASAP to discuss. Please Call back [phone]

## 2017-02-28 NOTE — Progress Notes (Signed)
PROGRESS NOTE    Natalie Wolfe  FAO:[id number] DOB: 25-Jan-1930 DOA: 02/26/2017 PCP: [name], MD   Brief Narrative: 83 year old elderly female with hypertension, type 2 diabetes, chronic systolic congestive heart failure, ESRD on hemodialysis presented after a fall at nursing home sustaining pelvic pain and fracture. Patient missed her dermatologist treatment on Saturday.  Assessment & Plan:  #Fall sustaining acute pelvic fracture associated with pelvic pain: -Patient reported mild pelvic discomfort and difficulty getting out of the bed. Still awaiting from orthopedics consult. I consulted orthopedics yesterday and again discussed with the [place] [facility] today. I spoke with Dr. Ophelia Charter as well for the consult.  -PT, OT evaluation. -Pain management -Social worker evaluation for safe discharge planning  # ESRD on hemodialysis: Hemodialysis treatment today. Monitor BMP and electrolytes. Nephrology consult appreciated.  #Hypokalemia on admission: Serum potassium level improved. Patient has mild hyponatremia likely contributed by the volume.  #Chronic systolic and diastolic congestive heart failure: Continue dialysis treatment and low salt diet.  #Anemia of chronic kidney disease: Monitor CBC. No sign of bleeding.   #Essential hypertension: Blood pressure fluctuating. Continue current medication. Patient is a hemodialysis patient.  #Dementia without behavioral disturbance: Continue supportive care at home medications.  Principal Problem:   Fall Active Problems:   Essential hypertension   Pubic ramus fracture (HCC)   ESRD on hemodialysis (HCC)   Anemia, chronic disease   Dementia without behavioral disturbance  DVT prophylaxis:heparin sq Code Status:dnr Family Communication: No family at bedside. Disposition Plan: Unknown at this time. Waiting for THE consult, PT OT evaluation and pain management.    Consultants:   Orthopedics  Procedures: None Antimicrobials:  None  Subjective: Seen and examined at bedside. Reported right elbow discomfort and difficulty getting out of bed. Denied headache, dizziness, nausea vomiting or chest pain.  Objective: Vitals:   02/28/17 1000 02/28/17 1030 02/28/17 1058 02/28/17 1323  BP: (!) 146/61 (!) 127/56 (!) 164/67 (!) 117/46  Pulse: (!) 48 (!) 57 (!) 58   Resp:   20   Temp:   97.6 F (36.4 C)   TempSrc:   Oral   SpO2:      Weight:   48.8 kg (107 lb 9.4 oz)   Height:        Intake/Output Summary (Last 24 hours) at 02/28/17 1438 Last data filed at 02/28/17 1200  Gross per 24 hour  Intake              480 ml  Output             1000 ml  Net             -520 ml   Filed Weights   02/27/17 2116 02/28/17 0641 02/28/17 1058  Weight: 45.9 kg (101 lb 3.2 oz) 50 kg (110 lb 3.3 oz) 48.8 kg (107 lb 9.4 oz)    Examination:  General exam: Appears calm and comfortable  Respiratory system: Clear to auscultation. Respiratory effort normal. No wheezing or crackle Cardiovascular system: S1 & S2 heard, RRR.  No pedal edema. Gastrointestinal system: Abdomen is nondistended, soft and nontender. Normal bowel sounds heard. Central nervous system: Alert Awake and following commands.   Data Reviewed: I have personally reviewed following labs and imaging studies  CBC:  Recent Labs Lab 02/26/17 2206 02/28/17 0712  WBC 5.3 5.7  NEUTROABS 3.8  --   HGB 9.7* 8.8*  HCT 31.5* 28.6*  MCV 81.8 80.3  PLT 176 194   Basic Metabolic Panel:  Recent Labs  Lab 02/26/17 2206 02/27/17 0716 02/28/17 0712  NA 132* 132* 130*  K 3.3* 3.4* 4.4  CL 92* 92* 92*  CO2 29 31 27   GLUCOSE 104* 106* 61*  BUN 30* 33* 41*  CREATININE 6.07* 6.54* 7.73*  CALCIUM 8.1* 8.2* 7.5*  PHOS  --  5.1* 5.8*   GFR: Estimated Creatinine Clearance: 3.8 mL/min (A) (by C-G formula based on SCr of 7.73 mg/dL (H)). Liver Function Tests:  Recent Labs Lab 02/27/17 0716 02/28/17 0712  ALBUMIN 2.1* 2.0*   No results for input(s): LIPASE,  AMYLASE in the last 168 hours. No results for input(s): AMMONIA in the last 168 hours. Coagulation Profile: No results for input(s): INR, PROTIME in the last 168 hours. Cardiac Enzymes: No results for input(s): CKTOTAL, CKMB, CKMBINDEX, TROPONINI in the last 168 hours. BNP (last 3 results) No results for input(s): PROBNP in the last 8760 hours. HbA1C: No results for input(s): HGBA1C in the last 72 hours. CBG: No results for input(s): GLUCAP in the last 168 hours. Lipid Profile: No results for input(s): CHOL, HDL, LDLCALC, TRIG, CHOLHDL, LDLDIRECT in the last 72 hours. Thyroid Function Tests: No results for input(s): TSH, T4TOTAL, FREET4, T3FREE, THYROIDAB in the last 72 hours. Anemia Panel: No results for input(s): VITAMINB12, FOLATE, FERRITIN, TIBC, IRON, RETICCTPCT in the last 72 hours. Sepsis Labs: No results for input(s): PROCALCITON, LATICACIDVEN in the last 168 hours.  Recent Results (from the past 240 hour(s))  MRSA PCR Screening     Status: None   Collection Time: 02/27/17  6:58 AM  Result Value Ref Range Status   MRSA by PCR NEGATIVE NEGATIVE Final    Comment:        The GeneXpert MRSA Assay (FDA approved for NASAL specimens only), is one component of a comprehensive MRSA colonization surveillance program. It is not intended to diagnose MRSA infection nor to guide or monitor treatment for MRSA infections.          Radiology Studies: Dg Chest 1 View  Result Date: 02/26/2017 CLINICAL DATA:  Unwitnessed fall yesterday.  Missed dialysis. EXAM: CHEST 1 VIEW COMPARISON:  1047 FINDINGS: Stable cardiomegaly. There is atherosclerosis and tortuosity of the thoracic aorta. Increased interstitial opacities over baseline and peribronchial cuffing suspicious for mild pulmonary edema. Possible small left pleural effusion. No confluent airspace disease. No pneumothorax. Left axillary stent is again seen. No acute osseous abnormalities. IMPRESSION: 1. Mild pulmonary edema and  possible left pleural effusion. 2. Stable cardiomegaly and tortuous atherosclerotic thoracic aorta. Electronically Signed   By: Rubye OaksMelanie  Ehinger M.D.   On: 02/26/2017 23:02   Ct Pelvis Wo Contrast  Result Date: 02/26/2017 CLINICAL DATA:  Unwitnessed fall. Stated history of superior pubic ramus fracture. EXAM: CT PELVIS WITHOUT CONTRAST TECHNIQUE: Multidetector CT imaging of the pelvis was performed following the standard protocol without intravenous contrast. COMPARISON:  None. FINDINGS: Urinary Tract: Urinary bladder is nondistended. Distal ureters are decompressed. Bowel: Colonic diverticulosis throughout the entire visualized colon. No evidence of diverticulitis. No bowel obstruction. Vascular/Lymphatic: Advanced aortic atherosclerosis. No bulky adenopathy. Reproductive:  Uterus is surgically absent.  No adnexal mass. Other:  Mild generalized edema the pelvic fat. Musculoskeletal: Acute fracture of the left superior pubic ramus at the acetabular junction, mildly displaced. There is an acute nondisplaced left inferior pubic ramus fracture. Right inferior pubis ramus fracture is likely remote with surrounding callus. Questionable nondisplaced sacral fracture on the left. Sacroiliac joints and pubic symphysis are congruent. Proximal femurs are intact. Hip joint spaces are maintained. There is  stranding about the left pubic rami fractures without large volume hemorrhage. IMPRESSION: 1. Acute mildly displaced fracture of the left superior pubic ramus at the acetabular junction. Acute nondisplaced left inferior pubic ramus fracture. 2. Questionable nondisplaced left sacral fracture arm. 3. Remote right inferior pubic ramus fracture with surrounding callus. 4. Aortic and branch atherosclerosis. Electronically Signed   By: Rubye Oaks M.D.   On: 02/26/2017 23:00        Scheduled Meds: . amLODipine  10 mg Oral QHS  . carvedilol  12.5 mg Oral 2 times per day on Sun Mon Wed Fri  . docusate sodium  100 mg  Oral Daily  . doxercalciferol  1 mcg Intravenous Q M,W,F-HD  . feeding supplement (NEPRO CARB STEADY)  237 mL Oral BID BM  . feeding supplement (PRO-STAT SUGAR FREE 64)  30 mL Oral BID  . fluocinonide cream  1 application Topical BID  . hydrALAZINE  25 mg Oral 3 times per day on Sun Mon Wed Fri  . lamoTRIgine  25 mg Oral QHS  . multivitamin  1 tablet Oral QHS  . sertraline  50 mg Oral Daily   Continuous Infusions:   LOS: 0 days    Lakendrick Paradis Jaynie Collins, MD [facility] Pager [phone]  If 7PM-7AM, please contact night-coverage [email or web address] Password [facility] 02/28/2017, 2:38 PM

## 2017-02-28 NOTE — Progress Notes (Signed)
83 year old minimal ambulator with left pelvic fracture. Plan - mobilize, partial weight bearing as tolerated on left side. Full consult to follow

## 2017-03-01 DIAGNOSIS — S32502A Unspecified fracture of left pubis, initial encounter for closed fracture: Secondary | ICD-10-CM | POA: Diagnosis not present

## 2017-03-01 DIAGNOSIS — N186 End stage renal disease: Secondary | ICD-10-CM | POA: Diagnosis not present

## 2017-03-01 DIAGNOSIS — W19XXXD Unspecified fall, subsequent encounter: Secondary | ICD-10-CM | POA: Diagnosis not present

## 2017-03-01 DIAGNOSIS — S32592A Other specified fracture of left pubis, initial encounter for closed fracture: Secondary | ICD-10-CM | POA: Diagnosis not present

## 2017-03-01 DIAGNOSIS — I10 Essential (primary) hypertension: Secondary | ICD-10-CM | POA: Diagnosis not present

## 2017-03-01 DIAGNOSIS — Z992 Dependence on renal dialysis: Secondary | ICD-10-CM | POA: Diagnosis not present

## 2017-03-01 DIAGNOSIS — D638 Anemia in other chronic diseases classified elsewhere: Secondary | ICD-10-CM | POA: Diagnosis not present

## 2017-03-01 MED ORDER — DARBEPOETIN ALFA 100 MCG/0.5ML IJ SOSY: 100.0000 ug | PREFILLED_SYRINGE | INTRAMUSCULAR | Status: DC

## 2017-03-01 MED ORDER — OXYCODONE-ACETAMINOPHEN 5-325 MG PO TABS
ORAL_TABLET | ORAL | Status: AC
  Administered 2017-03-01: 1 via ORAL
  Filled 2017-03-01: qty 1

## 2017-03-01 MED ORDER — PENTAFLUOROPROP-TETRAFLUOROETH EX AERO: 1.0000 "application " | INHALATION_SPRAY | CUTANEOUS | Status: DC | PRN

## 2017-03-01 MED ORDER — ALTEPLASE 2 MG IJ SOLR: 2.0000 mg | Freq: Once | INTRAMUSCULAR | Status: DC | PRN

## 2017-03-01 MED ORDER — SODIUM CHLORIDE 0.9 % IV SOLN: 100.0000 mL | INTRAVENOUS | Status: DC | PRN

## 2017-03-01 MED ORDER — LIDOCAINE HCL (PF) 1 % IJ SOLN: 5.0000 mL | INTRAMUSCULAR | Status: DC | PRN

## 2017-03-01 MED ORDER — HEPARIN SODIUM (PORCINE) 1000 UNIT/ML DIALYSIS: 1000.0000 [IU] | INTRAMUSCULAR | Status: DC | PRN

## 2017-03-01 NOTE — Progress Notes (Signed)
Natalie Wolfe to be D/C'd to Natalie Wolfe per MD order.  Discussed prescriptions and follow up appointments with the patient. Prescriptions given to patient, medication list explained in detail. Pt verbalized understanding.  Allergies as of 03/01/2017      Reactions   Heparin Other (See Comments)   POSITIVE Hep Induced Plt Ab and SRA 09/12/2014 and 09/17/2014 (2/9 mis-reported as negative by lab).  Pt rec'd SQ heparin and heparin w/ HD prior to + results.      Medication List    TAKE these medications   amLODipine 10 MG tablet Commonly known as:  NORVASC Take 10 mg by mouth at bedtime.   ATIVAN 0.5 MG tablet Generic drug:  LORazepam Place 0.5 mg under the tongue every 4 (four) hours as needed (for anxiety or agitation).   carvedilol 12.5 MG tablet Commonly known as:  COREG Take 1 tablet (12.5 mg total) by mouth 2 (two) times daily with a meal. What changed:  when to take this  additional instructions   docusate sodium 100 MG capsule Commonly known as:  COLACE Take 1 capsule (100 mg total) by mouth daily. To prevent constipation   feeding supplement (NEPRO CARB STEADY) Liqd Take 237 mLs by mouth 3 (three) times daily between meals.   feeding supplement (NEPRO CARB STEADY) Liqd Take 237 mLs by mouth 3 (three) times daily between meals.   ferrous sulfate 325 (65 FE) MG tablet Take 1 tablet (325 mg total) by mouth daily.   fluocinonide cream 0.05 % Commonly known as:  LIDEX Apply 1 application topically 2 (two) times daily. Apply from neck down twice a day (provided by dermatology).   hydrALAZINE 25 MG tablet Commonly known as:  APRESOLINE Take 1 tablet (25 mg total) by mouth every 8 (eight) hours. What changed:  when to take this  additional instructions   lamoTRIgine 25 MG tablet Commonly known as:  LAMICTAL Take 25 mg by mouth at bedtime.   lidocaine-prilocaine cream Commonly known as:  EMLA Apply 1 application topically as needed (prior to dialysis).    morphine 20 MG/ML concentrated solution Commonly known as:  ROXANOL Take 5-10 mg by mouth See admin instructions. 5 mg every hour as needed for mild pain and 10 mg every hour as needed for moderate pain or dyspnea   multivitamin Tabs tablet Take 1 tablet by mouth at bedtime.   oxyCODONE-acetaminophen 5-325 MG tablet Commonly known as:  PERCOCET/ROXICET Take 1 tablet by mouth every 6 (six) hours as needed (for pain).   polyethylene glycol packet Commonly known as:  MIRALAX / GLYCOLAX Take 17 g by mouth daily as needed for mild constipation.   sertraline 50 MG tablet Commonly known as:  ZOLOFT Take 1 tablet (50 mg total) by mouth daily. Patient must schedule and keep appointment for before any more refills. What changed:  additional instructions       Vitals:   03/01/17 1558 03/01/17 1826  BP: (!) 173/53 (!) 167/45  Pulse: 70 85  Resp: 18 17  Temp: 97.8 F (36.6 C) 98.2 F (36.8 C)    Skin clean, dry and intact without evidence of skin break down, no evidence of skin tears noted. IV catheter discontinued intact. Site without signs and symptoms of complications. Dressing and pressure applied. Pt denies pain at this time. No complaints noted.  An After Visit Summary was printed and given to the patient. Patient escorted via stretcher, and D/C to [facility].  Natalie BologneseAnisha Mabe RN, BSN

## 2017-03-01 NOTE — Discharge Summary (Signed)
Physician Discharge Summary  Natalie Wolfe YQM:[id number] DOB: 10-Nov-1929 DOA: 02/26/2017  PCP: [name], [name], MD  Admit date: 02/26/2017 Discharge date: 03/01/2017  Admitted From:[name] Disposition:SNF    Recommendations for Outpatient Follow-up:  1. Follow up with PCP and Orthopedics in 1-2 weeks 2. Please obtain BMP/CBC in one week  Home Health:SNF Equipment/Devices:none Discharge Condition:stable CODE STATUS:DNR Diet recommendation:heart healthy  Brief/Interim Summary: 83 year old elderly female with hypertension, type 2 diabetes, chronic systolic congestive heart failure, ESRD on hemodialysis presented after a fall at nursing home sustaining pelvic pain and fracture. The patient missed her hemodialysis treatment the day before admission.  #Fall sustaining acute pelvic fracture associated with pelvic pain: -Evaluated by orthopedics. Recommended mobilizing partial weightbearing as tolerated for 3 weeks then weightbearing as tolerated for the next 3 weeks on the left side. Patient will follow-up with orthopedics in 2-3 weeks. Continue physical therapy at the skilled facility. Continue supportive care and pain management. Patient with decreased mobility and functional status  prior to the falls.  # ESRD on hemodialysis: Hematology treatment as per nephrologist. Monitor blood pressure and large.  #Hypokalemia on admission: Improved  #Chronic systolic and diastolic congestive heart failure: Continue dialysis treatment and low salt diet.  #Anemia of chronic kidney disease: Monitor CBC. No sign of bleeding.   #Essential hypertension: Blood pressure fluctuating. Continue current medication. Patient is a hemodialysis patient.  #Dementia without behavioral disturbance: Continue supportive care at home medications.  Patient is clinically stable. Evaluated by PT OT and orthopedics. At this time patient is medically stable to transfer her care to outpatient with close  outpatient follow-up. She will benefit from physical therapy and supportive care.  Discharge Diagnoses:  Principal Problem:   Fall Active Problems:   Essential hypertension   Pubic ramus fracture, left, closed, initial encounter (HCC)   ESRD on hemodialysis (HCC)   Anemia, chronic disease   Dementia with behavioral disturbance    Discharge Instructions  Discharge Instructions    Call MD for:  difficulty breathing, headache or visual disturbances    Complete by:  As directed    Call MD for:  extreme fatigue    Complete by:  As directed    Call MD for:  hives    Complete by:  As directed    Call MD for:  persistant dizziness or light-headedness    Complete by:  As directed    Call MD for:  persistant nausea and vomiting    Complete by:  As directed    Call MD for:  severe uncontrolled pain    Complete by:  As directed    Call MD for:  temperature >100.4    Complete by:  As directed    Diet - low sodium heart healthy    Complete by:  As directed    Discharge instructions    Complete by:  As directed    mobilize partial weightbearing as tolerated for 3 weeks then weightbearing as tolerated for the next 3 weeks on the left- side.   Increase activity slowly    Complete by:  As directed      Allergies as of 03/01/2017      Reactions   Heparin Other (See Comments)   POSITIVE Hep Induced Plt Ab and SRA 09/12/2014 and 09/17/2014 (2/9 mis-reported as negative by lab).  Pt rec'd SQ heparin and heparin w/ HD prior to + results.      Medication List    TAKE these medications   amLODipine 10 MG  tablet Commonly known as:  NORVASC Take 10 mg by mouth at bedtime.   ATIVAN 0.5 MG tablet Generic drug:  LORazepam Place 0.5 mg under the tongue every 4 (four) hours as needed (for anxiety or agitation).   carvedilol 12.5 MG tablet Commonly known as:  COREG Take 1 tablet (12.5 mg total) by mouth 2 (two) times daily with a meal. What changed:  when to take this  additional  instructions   docusate sodium 100 MG capsule Commonly known as:  COLACE Take 1 capsule (100 mg total) by mouth daily. To prevent constipation   feeding supplement (NEPRO CARB STEADY) Liqd Take 237 mLs by mouth 3 (three) times daily between meals.   feeding supplement (NEPRO CARB STEADY) Liqd Take 237 mLs by mouth 3 (three) times daily between meals.   ferrous sulfate 325 (65 FE) MG tablet Take 1 tablet (325 mg total) by mouth daily.   fluocinonide cream 0.05 % Commonly known as:  LIDEX Apply 1 application topically 2 (two) times daily. Apply from neck down twice a day (provided by dermatology).   hydrALAZINE 25 MG tablet Commonly known as:  APRESOLINE Take 1 tablet (25 mg total) by mouth every 8 (eight) hours. What changed:  when to take this  additional instructions   lamoTRIgine 25 MG tablet Commonly known as:  LAMICTAL Take 25 mg by mouth at bedtime.   lidocaine-prilocaine cream Commonly known as:  EMLA Apply 1 application topically as needed (prior to dialysis).   morphine 20 MG/ML concentrated solution Commonly known as:  ROXANOL Take 5-10 mg by mouth See admin instructions. 5 mg every hour as needed for mild pain and 10 mg every hour as needed for moderate pain or dyspnea   multivitamin Tabs tablet Take 1 tablet by mouth at bedtime.   oxyCODONE-acetaminophen 5-325 MG tablet Commonly known as:  PERCOCET/ROXICET Take 1 tablet by mouth every 6 (six) hours as needed (for pain).   polyethylene glycol packet Commonly known as:  MIRALAX / GLYCOLAX Take 17 g by mouth daily as needed for mild constipation.   sertraline 50 MG tablet Commonly known as:  ZOLOFT Take 1 tablet (50 mg total) by mouth daily. Patient must schedule and keep appointment for before any more refills. What changed:  additional instructions       Contact information for follow-up providers    [name], MD. Schedule an appointment as soon as possible for a visit in 1 week(s).    Specialty:  Internal Medicine Contact information: [address] [place] [address] [phone]        Cammy Copa, MD. Schedule an appointment as soon as possible for a visit in 2 week(s).   Specialty:  Orthopedic Surgery Contact information: [address] [place] [address] [phone]            Contact information for after-discharge care    Destination    [facility] Follow up.   Specialty:  Skilled Nursing Facility [address] [place] [address] [phone]                 Allergies  Allergen Reactions  . Heparin Other (See Comments)    POSITIVE Hep Induced Plt Ab and SRA 09/12/2014 and 09/17/2014 (2/9 mis-reported as negative by lab).  Pt rec'd SQ heparin and heparin w/ HD prior to + results.    Consultations: Orthopedics  Procedures/Studies: None  Subjective: Seen and examined at bedside. Denied headache, dizziness,  nausea vomiting chest pain shortness of breath. Mild pelvic discomfort with ambulation.  Discharge Exam: Vitals:   03/01/17 0605 03/01/17 1022  BP: (!) 167/52 (!) 147/47  Pulse: 65 (!) 58  Resp: 16 18  Temp: 98.7 F (37.1 C) 98.7 F (37.1 C)   Vitals:   02/28/17 1612 02/28/17 2129 03/01/17 0605 03/01/17 1022  BP: (!) 148/46 (!) 167/43 (!) 167/52 (!) 147/47  Pulse: (!) 54 61 65 (!) 58  Resp: 18 20 16 18   Temp: 98.5 F (36.9 C) 98.6 F (37 C) 98.7 F (37.1 C) 98.7 F (37.1 C)  TempSrc: Oral Oral Oral Oral  SpO2: 100% 95% 93% 97%  Weight:  49.1 kg (108 lb 3.9 oz)    Height:        General: Pt is alert, awake, not in acute distress Cardiovascular: RRR, S1/S2 +, no rubs, no gallops Respiratory: CTA bilaterally, no wheezing, no rhonchi Abdominal: Soft, NT, ND, bowel sounds + Extremities: no edema, no cyanosis    The results of significant diagnostics from this hospitalization (including imaging, microbiology, ancillary and laboratory) are  listed below for reference.     Microbiology: Recent Results (from the past 240 hour(s))  MRSA PCR Screening     Status: None   Collection Time: 02/27/17  6:58 AM  Result Value Ref Range Status   MRSA by PCR NEGATIVE NEGATIVE Final    Comment:        The GeneXpert MRSA Assay (FDA approved for NASAL specimens only), is one component of a comprehensive MRSA colonization surveillance program. It is not intended to diagnose MRSA infection nor to guide or monitor treatment for MRSA infections.      Labs: BNP (last 3 results) No results for input(s): BNP in the last 8760 hours. Basic Metabolic Panel:  Recent Labs Lab 02/26/17 2206 02/27/17 0716 02/28/17 0712 02/28/17 2036  NA 132* 132* 130* 132*  K 3.3* 3.4* 4.4 3.6  CL 92* 92* 92* 93*  CO2 29 31 27  32  GLUCOSE 104* 106* 61* 77  BUN 30* 33* 41* 16  CREATININE 6.07* 6.54* 7.73* 3.55*  CALCIUM 8.1* 8.2* 7.5* 7.7*  PHOS  --  5.1* 5.8* 3.1   Liver Function Tests:  Recent Labs Lab 02/27/17 0716 02/28/17 0712 02/28/17 2036  ALBUMIN 2.1* 2.0* 2.1*   No results for input(s): LIPASE, AMYLASE in the last 168 hours. No results for input(s): AMMONIA in the last 168 hours. CBC:  Recent Labs Lab 02/26/17 2206 02/28/17 0712 02/28/17 2036  WBC 5.3 5.7 5.0  NEUTROABS 3.8  --   --   HGB 9.7* 8.8* 9.0*  HCT 31.5* 28.6* 29.9*  MCV 81.8 80.3 81.0  PLT 176 194 190   Cardiac Enzymes: No results for input(s): CKTOTAL, CKMB, CKMBINDEX, TROPONINI in the last 168 hours. BNP: Invalid input(s): POCBNP CBG: No results for input(s): GLUCAP in the last 168 hours. D-Dimer No results for input(s): DDIMER in the last 72 hours. Hgb A1c No results for input(s): HGBA1C in the last 72 hours. Lipid Profile No results for input(s): CHOL, HDL, LDLCALC, TRIG, CHOLHDL, LDLDIRECT in the last 72 hours. Thyroid function studies No results for input(s): TSH, T4TOTAL, T3FREE, THYROIDAB in the last 72 hours.  Invalid input(s):  FREET3 Anemia work up No results for input(s): VITAMINB12, FOLATE, FERRITIN, TIBC, IRON, RETICCTPCT in the last 72 hours. Urinalysis    Component Value Date/Time   COLORURINE YELLOW 03/12/2015 1506   APPEARANCEUR TURBID (A) 03/12/2015 1506   LABSPEC 1.025 03/12/2015  1506   PHURINE 6.5 03/12/2015 1506   GLUCOSEU NEGATIVE 03/12/2015 1506   HGBUR MODERATE (A) 03/12/2015 1506   BILIRUBINUR SMALL (A) 03/12/2015 1506   KETONESUR NEGATIVE 03/12/2015 1506   PROTEINUR >300 (A) 03/12/2015 1506   UROBILINOGEN 0.2 03/12/2015 1506   NITRITE NEGATIVE 03/12/2015 1506   LEUKOCYTESUR LARGE (A) 03/12/2015 1506   Sepsis Labs Invalid input(s): PROCALCITONIN,  WBC,  LACTICIDVEN Microbiology Recent Results (from the past 240 hour(s))  MRSA PCR Screening     Status: None   Collection Time: 02/27/17  6:58 AM  Result Value Ref Range Status   MRSA by PCR NEGATIVE NEGATIVE Final    Comment:        The GeneXpert MRSA Assay (FDA approved for NASAL specimens only), is one component of a comprehensive MRSA colonization surveillance program. It is not intended to diagnose MRSA infection nor to guide or monitor treatment for MRSA infections.      Time coordinating discharge: 28 minutes  SIGNED:   Maxie Barb, MD  [facility] 03/01/2017, 11:37 AM  If 7PM-7AM, please contact night-coverage [email or web address] Password [email or web address]

## 2017-03-01 NOTE — Progress Notes (Signed)
[facility] Progress Note  Subjective: pt says she is getting up and walking w PT.  Her pain is much better. PT recommending SNF.   Vitals:   02/28/17 1323 02/28/17 1612 02/28/17 2129 03/01/17 0605  BP: (!) 117/46 (!) 148/46 (!) 167/43 (!) 167/52  Pulse:  (!) 54 61 65  Resp:  18 20 16   Temp:  98.5 F (36.9 C) 98.6 F (37 C) 98.7 F (37.1 C)  TempSrc:  Oral Oral Oral  SpO2:  100% 95% 93%  Weight:   49.1 kg (108 lb 3.9 oz)   Height:        Inpatient medications: . amLODipine  10 mg Oral QHS  . carvedilol  12.5 mg Oral 2 times per day on Sun Mon Wed Fri  . docusate sodium  100 mg Oral Daily  . doxercalciferol  1 mcg Intravenous Q M,W,F-HD  . feeding supplement (NEPRO CARB STEADY)  237 mL Oral BID BM  . feeding supplement (PRO-STAT SUGAR FREE 64)  30 mL Oral BID  . fluocinonide cream  1 application Topical BID  . hydrALAZINE  25 mg Oral 3 times per day on Sun Mon Wed Fri  . lamoTRIgine  25 mg Oral QHS  . multivitamin  1 tablet Oral QHS  . sertraline  50 mg Oral Daily   . sodium chloride    . sodium chloride     sodium chloride, sodium chloride, acetaminophen **OR** acetaminophen, levalbuterol, lidocaine (PF), lidocaine-prilocaine, LORazepam, morphine, morphine, ondansetron **OR** ondansetron (ZOFRAN) IV, oxyCODONE-acetaminophen, pentafluoroprop-tetrafluoroeth, polyethylene glycol  Exam: No distress No jvd Chest clear bilat RRR 2/6 SEM Abd soft ntnd no mass or hsm Ext no LE edema NF, Ox 3 L arm AVF +bruit  [facility]   2/2.25 bath  45.5kg  Hep none  L arm AVF - Mircera 150mcg q 2 weeks (last given 6/26, just reordered) - Hectoral 1mcg IV q HD      Impression: 1  Fall/ bilat pubic ramus fx's - per primary/ PT 2  ESRD HD TTS. Had HD yest for last Sat. HD today, still up 3-4 kg 3  HTN - bp's up 4  Metabolic bone disease - Ca/ P ok, cont binders, vdra 5  Chron syst CHF - EF 35% in 2016 6  Anemia of CKD - for darbe 100 this week 7  Dispo -  PT suggesting SNF  Plan - HD today, UF 2-3kg   Vinson Moselleob Sandor Arboleda MD [facility] pager [phone]   03/01/2017, 8:51 AM    Recent Labs Lab 02/27/17 0716 02/28/17 0712 02/28/17 2036  NA 132* 130* 132*  K 3.4* 4.4 3.6  CL 92* 92* 93*  CO2 31 27 32  GLUCOSE 106* 61* 77  BUN 33* 41* 16  CREATININE 6.54* 7.73* 3.55*  CALCIUM 8.2* 7.5* 7.7*  PHOS 5.1* 5.8* 3.1    Recent Labs Lab 02/27/17 0716 02/28/17 0712 02/28/17 2036  ALBUMIN 2.1* 2.0* 2.1*    Recent Labs Lab 02/26/17 2206 02/28/17 0712 02/28/17 2036  WBC 5.3 5.7 5.0  NEUTROABS 3.8  --   --   HGB 9.7* 8.8* 9.0*  HCT 31.5* 28.6* 29.9*  MCV 81.8 80.3 81.0  PLT 176 194 190   Iron/TIBC/Ferritin/ %Sat    Component Value Date/Time   IRON 20 (L) 04/22/2016 0847   TIBC 157 (L) 04/22/2016 0847   FERRITIN 1,283 (H) 09/16/2015 1947   IRONPCTSAT 13 04/22/2016 0847

## 2017-03-01 NOTE — NC FL2 (Signed)
[place] MEDICAID FL2 LEVEL OF CARE SCREENING TOOL     IDENTIFICATION  Patient Name: Natalie Wolfe Birthdate: Jun 06, 1930 Sex: female Admission Date (Current Location): 02/26/2017  [facility] and [place] Number:  Producer, television/film/video and Address:  The . [facility], [address], [place], [place] [address]      Provider Number: [id number]  Attending Physician Name and Address:  Maxie Barb, MD  Relative Name and Phone Number:       Current Level of Care: Hospital Recommended Level of Care: Skilled Nursing Facility Prior Approval Number:    Date Approved/Denied:   PASRR Number:    Discharge Plan: SNF    Current Diagnoses: Patient Active Problem List   Diagnosis Date Noted  . Fall 02/27/2017  . Pubic ramus fracture, left, closed, initial encounter (HCC) 02/27/2017  . ESRD on hemodialysis (HCC) 02/27/2017  . Anemia, chronic disease 02/27/2017  . Dementia with behavioral disturbance 02/27/2017  . Goals of care, counseling/discussion   . Palliative care encounter   . DNR (do not resuscitate)   . ESRD needing dialysis (HCC) 04/19/2016  . Leukopenia 04/19/2016  . Uremia 04/19/2016  . Hypothermia 04/19/2016  . Uremia of renal origin 04/19/2016  . Dehydration   . Hypoglycemia 03/12/2015  . Failure to thrive in adult 03/09/2015  . Memory loss 02/10/2015  . Depression 02/05/2015  . Hyperkalemia 01/31/2015  . Pulmonary edema 12/23/2014  . Protein-calorie malnutrition, severe (HCC) 12/23/2014  . Anorexia 10/23/2014  . Normal coronary arteries 09/16/2014 09/17/2014  . Congestive dilated cardiomyopathy-EF 40-45% at cath, 35% by echo   . Troponin level elevated-0.2 09/13/2014  . Syncope 09/12/2014  . ESRD (end stage renal disease) (HCC) 09/12/2014  . Thrombocytopenia (HCC) 09/12/2014  . Cardiopulmonary arrest (HCC) 09/12/2014  . SOB (shortness of breath)   . Overactive bladder 01/14/2013  . Unspecified vitamin D deficiency 01/14/2013  .  Anemia of chronic disease 01/14/2013  . Arthritis of knee 01/14/2013  . Essential hypertension 11/27/2012  . Hyperlipemia 11/27/2012    Orientation RESPIRATION BLADDER Height & Weight     Self, Place, Situation  Normal Continent Weight: 108 lb 3.9 oz (49.1 kg) Height:  5' (152.4 cm)  BEHAVIORAL SYMPTOMS/MOOD NEUROLOGICAL BOWEL NUTRITION STATUS      Continent Diet (renal, carb modified)  AMBULATORY STATUS COMMUNICATION OF NEEDS Skin   Extensive Assist Verbally Normal                       Personal Care Assistance Level of Assistance  Dressing, Bathing Bathing Assistance: Maximum assistance   Dressing Assistance: Maximum assistance     Functional Limitations Info             SPECIAL CARE FACTORS FREQUENCY                       Contractures      Additional Factors Info  Code Status, Allergies, Psychotropic Code Status Info: DNR Allergies Info: Heparin Psychotropic Info: zoloft         Current Medications (03/01/2017):  This is the current hospital active medication list Current Facility-Administered Medications  Medication Dose Route Frequency Provider Last Rate Last Dose  . 0.9 %  sodium chloride infusion  100 mL Intravenous PRN Julien Nordmann, PA-C      . 0.9 %  sodium chloride infusion  100 mL Intravenous PRN Julien Nordmann, PA-C      . acetaminophen (TYLENOL) tablet 650 mg  650 mg Oral Q6H PRN Madelyn FlavorsSmith, Rondell A, MD       Or  . acetaminophen (TYLENOL) suppository 650 mg  650 mg Rectal Q6H PRN Madelyn FlavorsSmith, Rondell A, MD      . amLODipine (NORVASC) tablet 10 mg  10 mg Oral QHS Smith, Rondell A, MD   10 mg at 02/28/17 2127  . carvedilol (COREG) tablet 12.5 mg  12.5 mg Oral 2 times per day on Sun Mon Wed Fri Madelyn FlavorsSmith, Rondell A, MD   12.5 mg at 02/28/17 1203  . [START ON 03/03/2017] Darbepoetin Alfa (ARANESP) injection 100 mcg  100 mcg Intravenous Q Thu-HD Delano MetzSchertz, Robert, MD      . docusate sodium (COLACE) capsule 100 mg  100 mg Oral Daily Katrinka BlazingSmith, Rondell  A, MD   100 mg at 02/28/17 1203  . doxercalciferol (HECTOROL) injection 1 mcg  1 mcg Intravenous Q M,W,F-HD Julien NordmannStovall, Kathryn R, PA-C   1 mcg at 02/28/17 1132  . feeding supplement (NEPRO CARB STEADY) liquid 237 mL  237 mL Oral BID BM Maxie BarbBhandari, Dron Prasad, MD   237 mL at 02/28/17 1323  . feeding supplement (PRO-STAT SUGAR FREE 64) liquid 30 mL  30 mL Oral BID Julien NordmannStovall, Kathryn R, PA-C   30 mL at 02/28/17 1204  . fluocinonide cream (LIDEX) 0.05 % 1 application  1 application Topical BID Clydie BraunSmith, Rondell A, MD   1 application at 02/28/17 1206  . hydrALAZINE (APRESOLINE) tablet 25 mg  25 mg Oral 3 times per day on Sun Mon Wed Fri Madelyn FlavorsSmith, Rondell A, MD   25 mg at 02/28/17 2141  . lamoTRIgine (LAMICTAL) tablet 25 mg  25 mg Oral QHS Smith, Rondell A, MD   25 mg at 02/28/17 2136  . levalbuterol (XOPENEX) nebulizer solution 0.63 mg  0.63 mg Nebulization Q6H PRN Smith, Rondell A, MD      . lidocaine-prilocaine (EMLA) cream 1 application  1 application Topical PRN Julien NordmannStovall, Kathryn R, PA-C      . LORazepam (ATIVAN) tablet 0.5 mg  0.5 mg Sublingual Q4H PRN Madelyn FlavorsSmith, Rondell A, MD      . morphine 10 MG/5ML solution 10 mg  10 mg Oral Q4H PRN Smith, Rondell A, MD      . morphine 10 MG/5ML solution 5 mg  5 mg Oral Q4H PRN Madelyn FlavorsSmith, Rondell A, MD      . multivitamin (RENA-VIT) tablet 1 tablet  1 tablet Oral QHS Madelyn FlavorsSmith, Rondell A, MD   1 tablet at 02/28/17 2127  . ondansetron (ZOFRAN) tablet 4 mg  4 mg Oral Q6H PRN Madelyn FlavorsSmith, Rondell A, MD       Or  . ondansetron (ZOFRAN) injection 4 mg  4 mg Intravenous Q6H PRN Smith, Rondell A, MD      . oxyCODONE-acetaminophen (PERCOCET/ROXICET) 5-325 MG per tablet 1 tablet  1 tablet Oral Q6H PRN Clydie BraunSmith, Rondell A, MD   1 tablet at 02/28/17 1131  . polyethylene glycol (MIRALAX / GLYCOLAX) packet 17 g  17 g Oral Daily PRN Madelyn FlavorsSmith, Rondell A, MD      . sertraline (ZOLOFT) tablet 50 mg  50 mg Oral Daily Madelyn FlavorsSmith, Rondell A, MD   50 mg at 02/28/17 1203     Discharge Medications: Please see discharge  summary for a list of discharge medications.  Relevant Imaging Results:  Relevant Lab Results:   Additional Information SSN: [id number]   [facility]  Burna SisUris, Tywone Bembenek H, LCSW

## 2017-03-01 NOTE — Progress Notes (Signed)
[facility]. Patient ready for D/C. Will continue to monitor.

## 2017-03-01 NOTE — Clinical Social Work Note (Signed)
Patient medically stable for discharge back to [facility] today. Admissions director Velna HatchetSheila, contacted and advised and discharge clinicals transmitted to facility. Patient's  cusin Alfred LevinsGeorge Satterfield also contacted [phone]) and informed of discharge. Ms. Peggye PittRichards will be [facility].  Genelle BalVanessa Othel Dicostanzo, MSW, LCSW Licensed Clinical Social Worker Clinical Social Work Department [facility] [phone]

## 2017-03-01 NOTE — Consult Note (Signed)
           [facility] Primary [facility] Navigator  03/01/2017  Natalie ReaderGeorgianna Wolfe 1929-09-07 [id number]   Went to see patient at the bedsideto identify possible discharge needs but RN reports that sheis inhemodialysis at this time.  Will attemptto see patient again at another time when she is available in room.  Per RN report, patient is a [facility] with plan to return back to skilled nursing facility [facility] after dialysis.  No further health management needs noted at this point.   For questions, please contact:  Wyatt HasteLorraine Robbin Loughmiller, BSN, RN- [facility] Primary Care Navigator  Telephone: [phone] [facility]

## 2017-03-01 NOTE — Progress Notes (Signed)
Patient up to chair for HD. Seems to be tolerating this well. No complaints of pain. Will continue to monitor.

## 2017-04-09 DEATH — deceased

## 2017-05-21 IMAGING — CR DG HIP (WITH OR WITHOUT PELVIS) 2-3V*R*
3 series · 3 of 3 positions shown · non-contrast
Comparison: None.

CLINICAL DATA: Right hip pain after unwitnessed fall.

EXAM:
DG HIP (WITH OR WITHOUT PELVIS) 2-3V RIGHT

[pelvis ap]
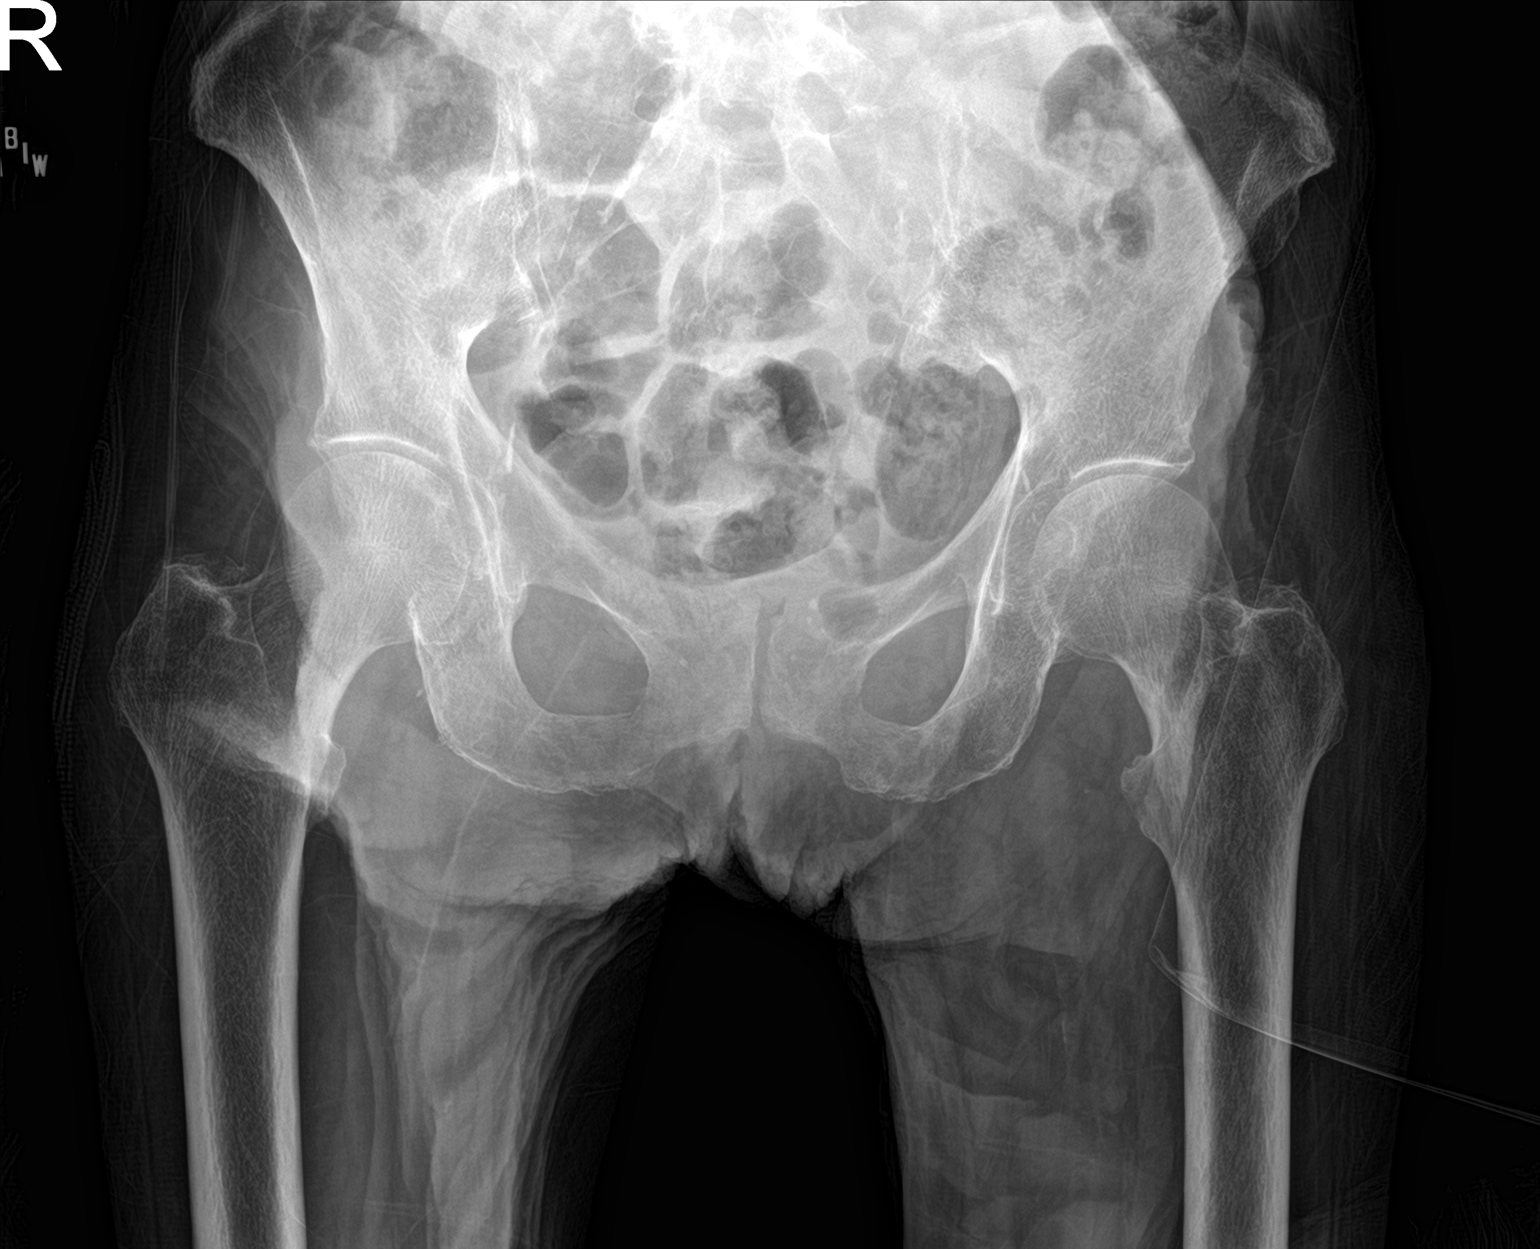

[hip ap]
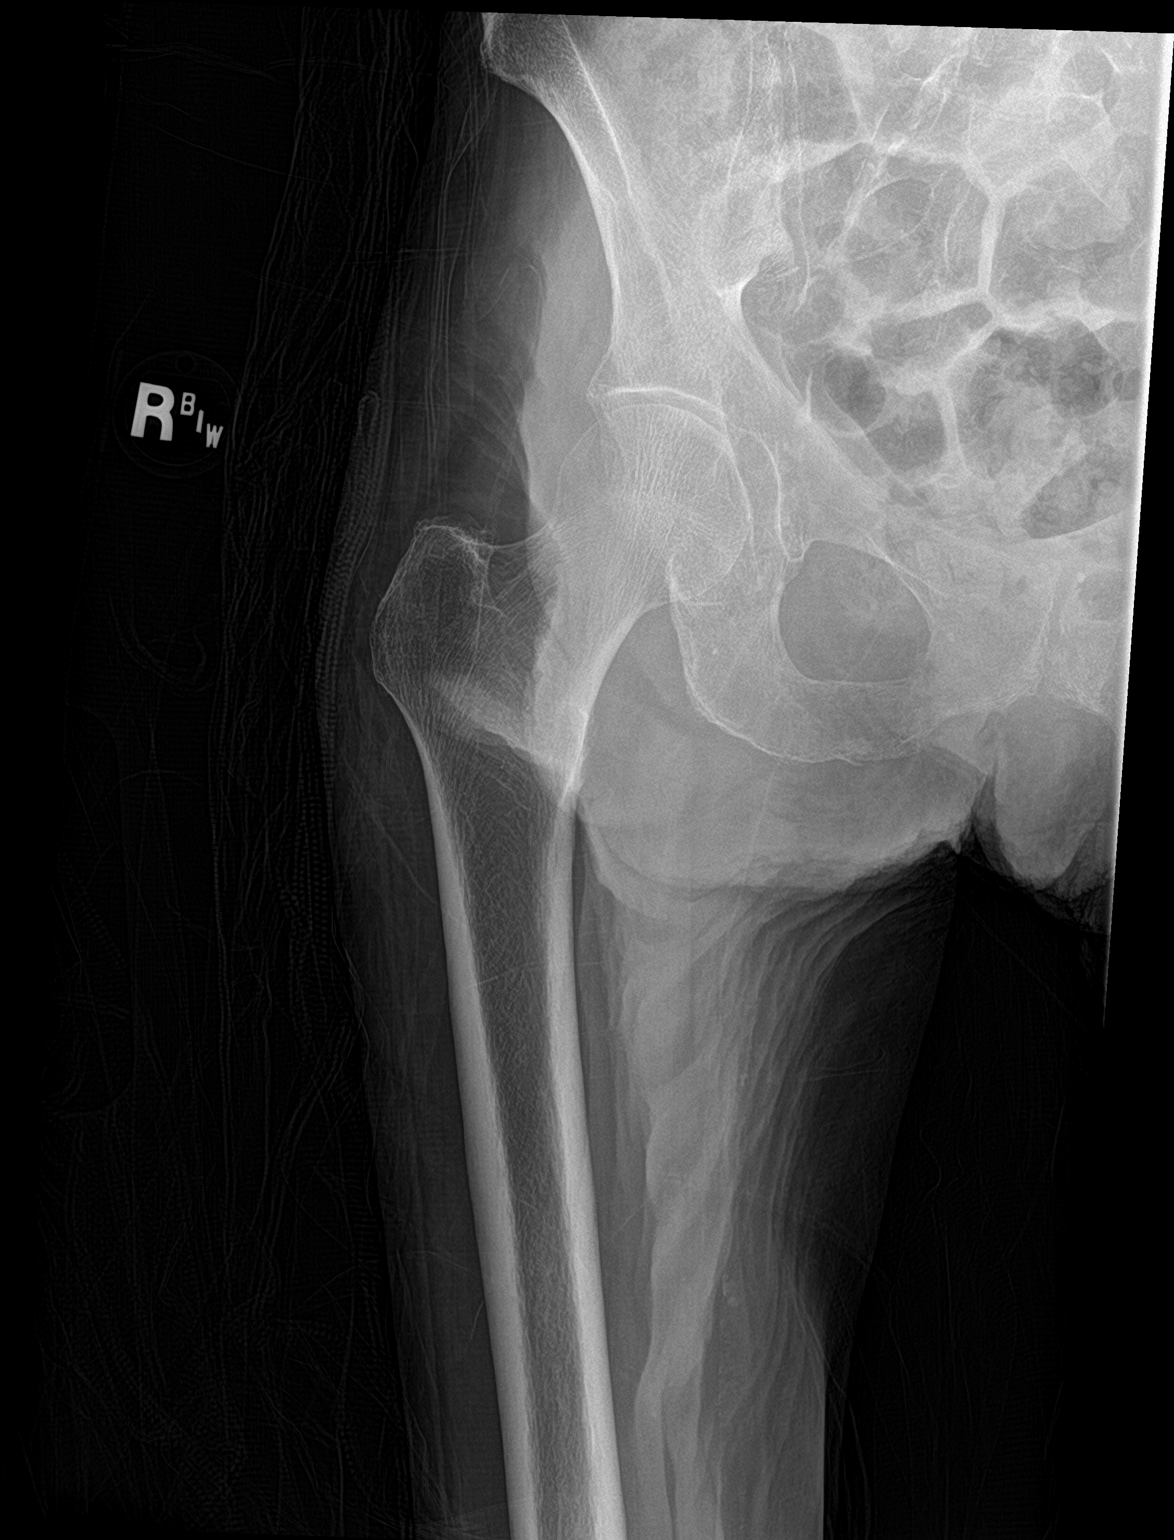

[hip lat]
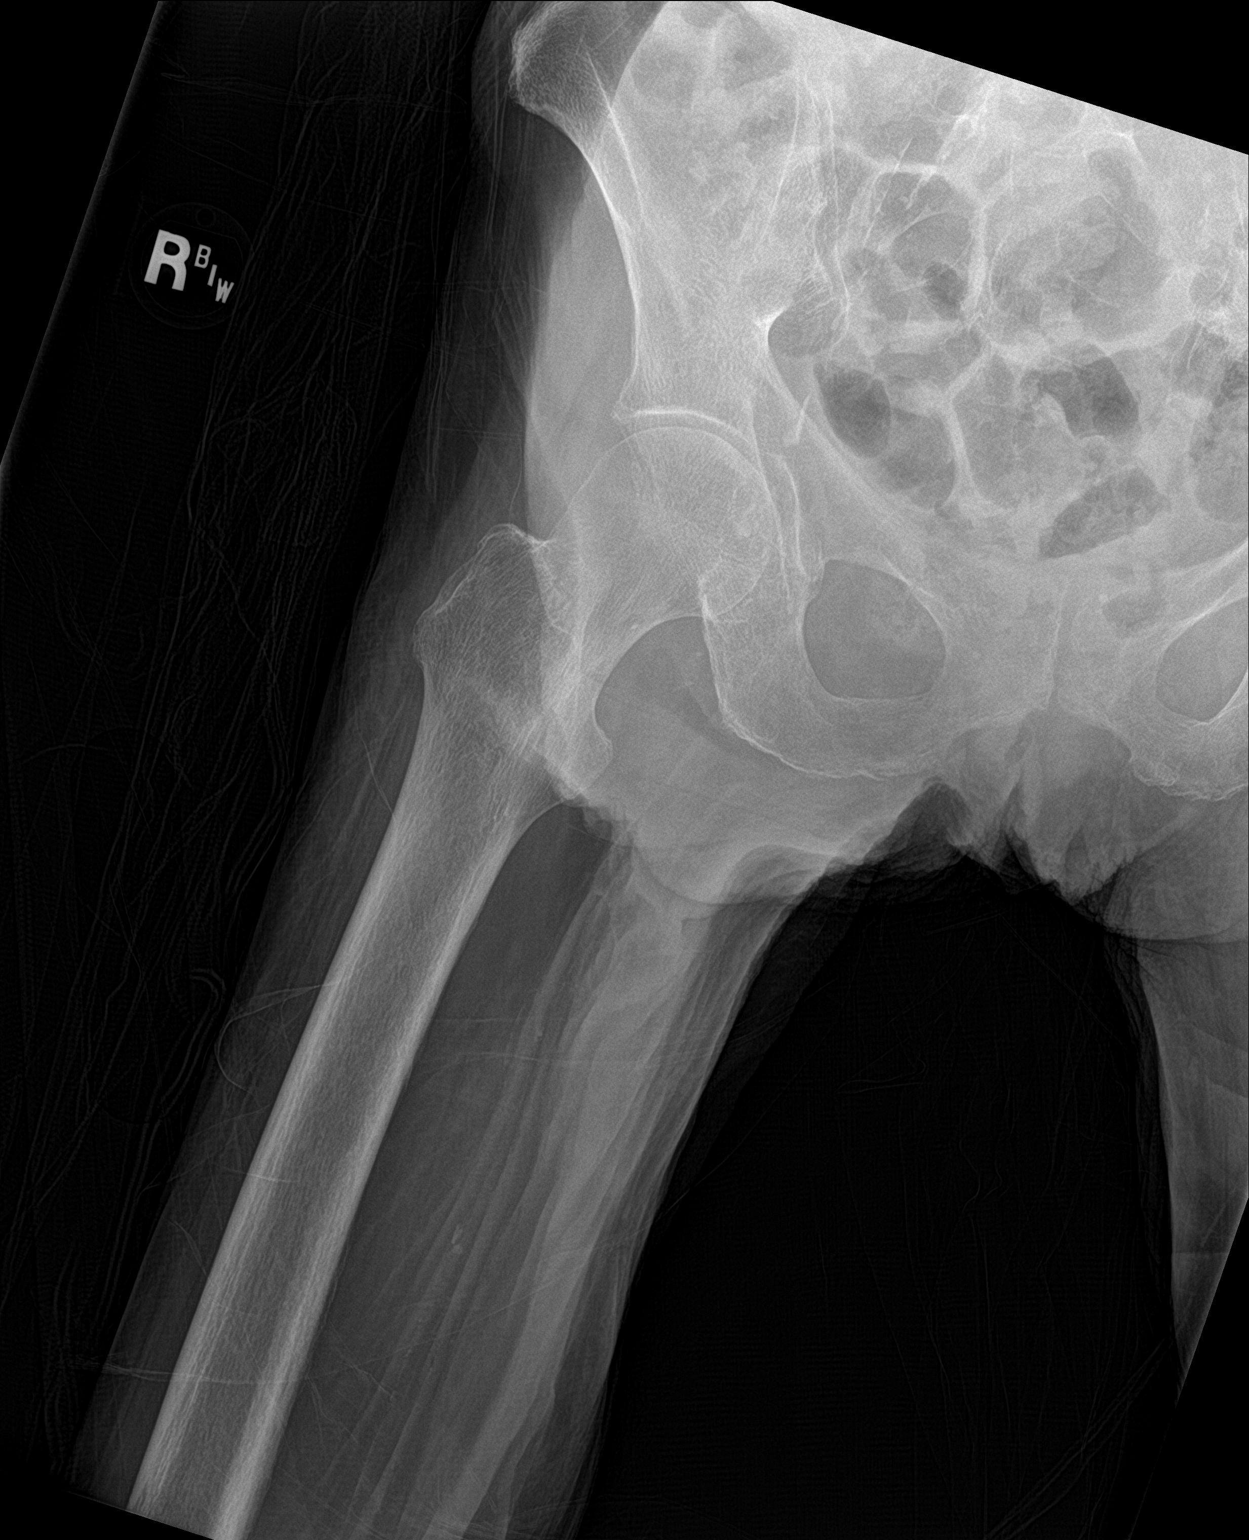

[3 of 3 positions shown; findings below may reference images not displayed]

FINDINGS: There is no evidence of hip fracture or dislocation. There is no
evidence of arthropathy or other focal bone abnormality.
IMPRESSION: Normal right hip.

## 2017-05-21 IMAGING — CT CT CERVICAL SPINE W/O CM
3 series · 13 of 33 positions shown, 16 images · non-contrast
Comparison: CT scan of April 19, 2016.

CLINICAL DATA: Head laceration after fall today. No loss of
consciousness.

EXAM:
CT HEAD WITHOUT CONTRAST
CT CERVICAL SPINE WITHOUT CONTRAST
TECHNIQUE: Multidetector CT imaging of the head and cervical spine was
performed following the standard protocol without intravenous
contrast. Multiplanar CT image reconstructions of the cervical spine
were also generated.

[Series 3: head 5.0 h30s · axial · 0.43mm/px · z∈[-92,+8]mm · 5 of 30 slices shown, 7 images]
[im 5/30  soft-tissue]
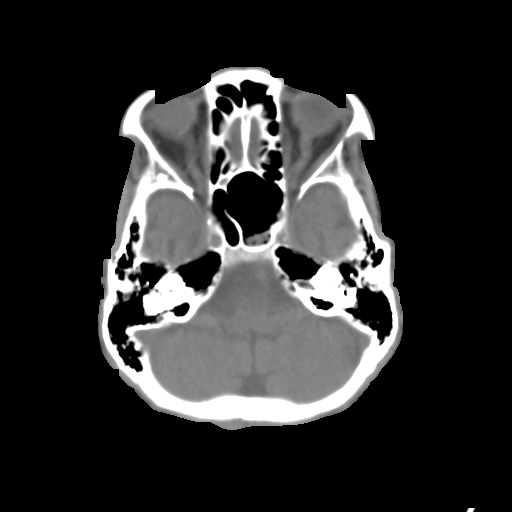
[im 5/30  bone]
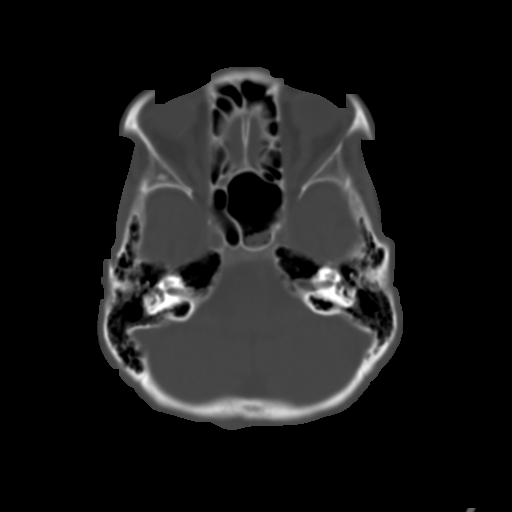
[im 9/30  bone]
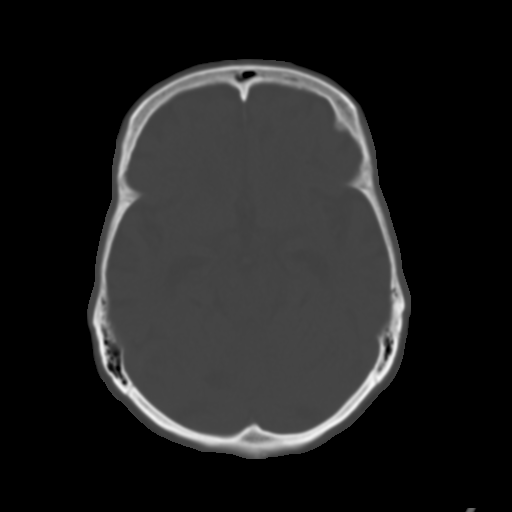
[im 16/30  bone]
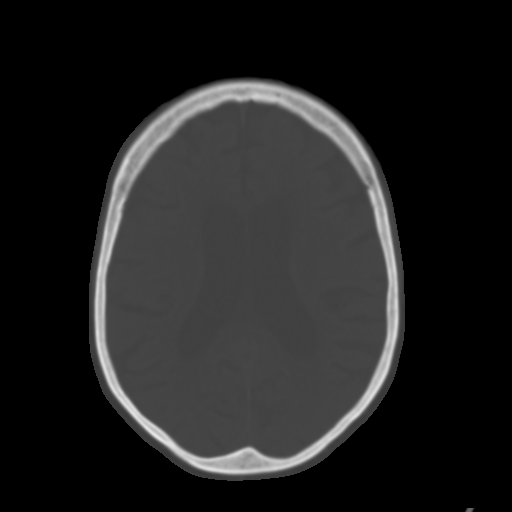
[im 21/30  bone]
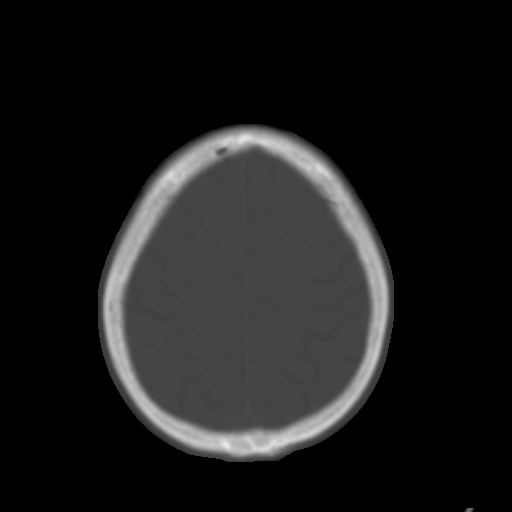
[im 25/30  soft-tissue]
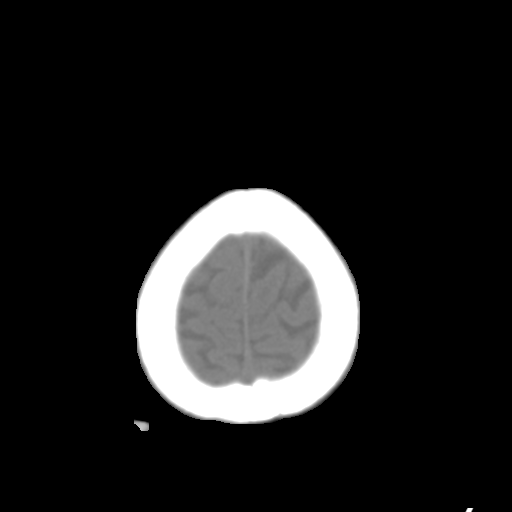
[im 25/30  bone]
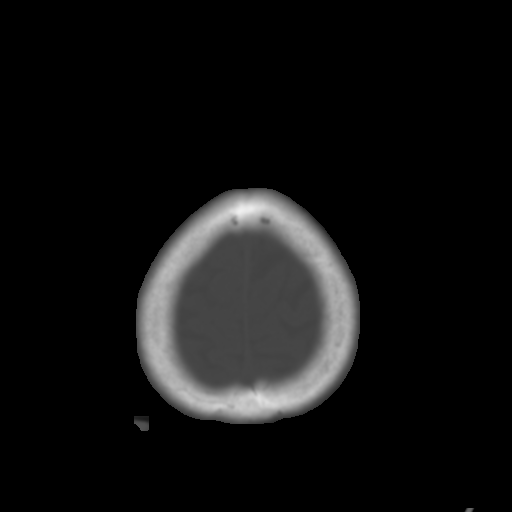

[Series 5: head 3.0 mpr cor · coronal · 0.30mm/px · 3 of 67 slices shown]
[im 14/67  bone]
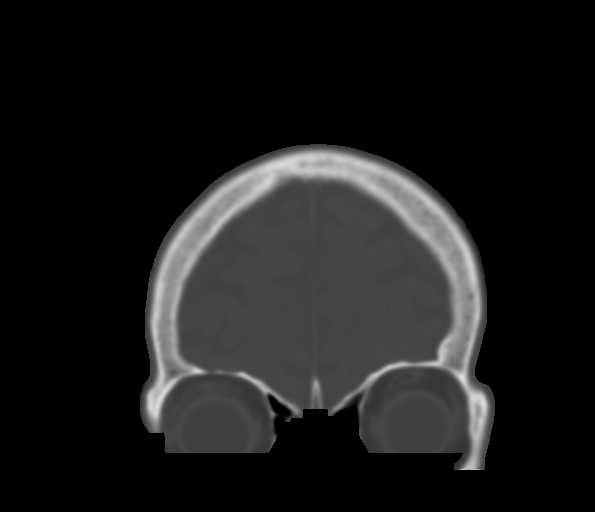
[im 27/67  bone]
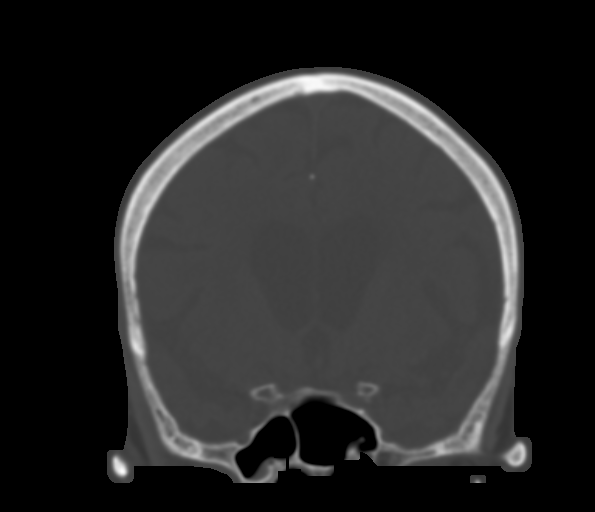
[im 40/67  bone]
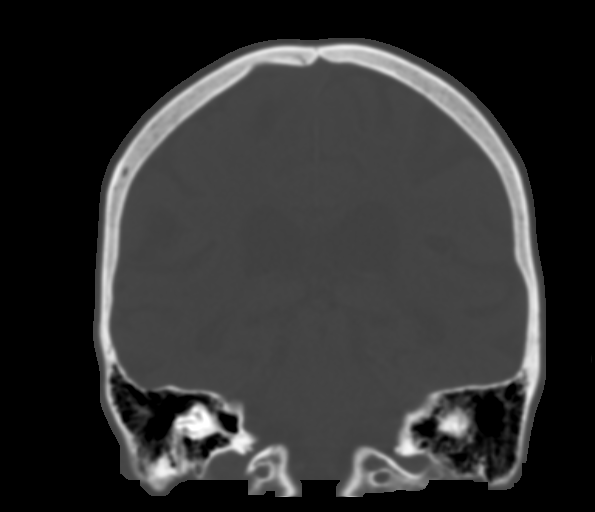

[Series 6: head 3.0 mpr sag · sagittal · 0.32mm/px · 5 of 62 slices shown, 6 images]
[im 21/62  bone]
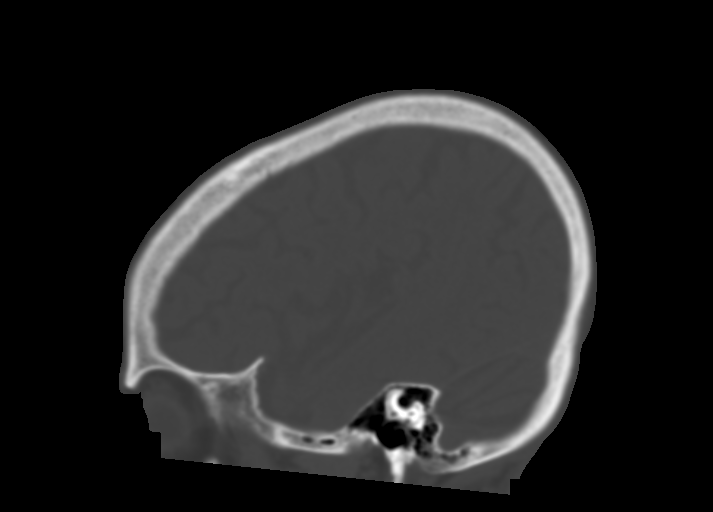
[im 26/62  bone]
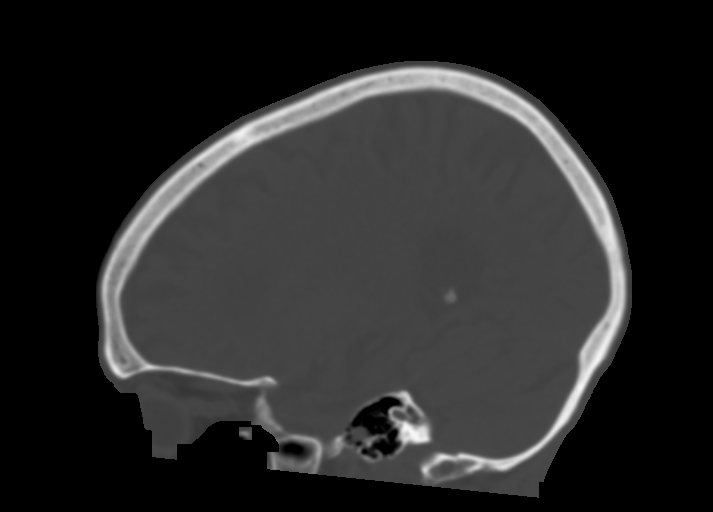
[im 31/62  soft-tissue]
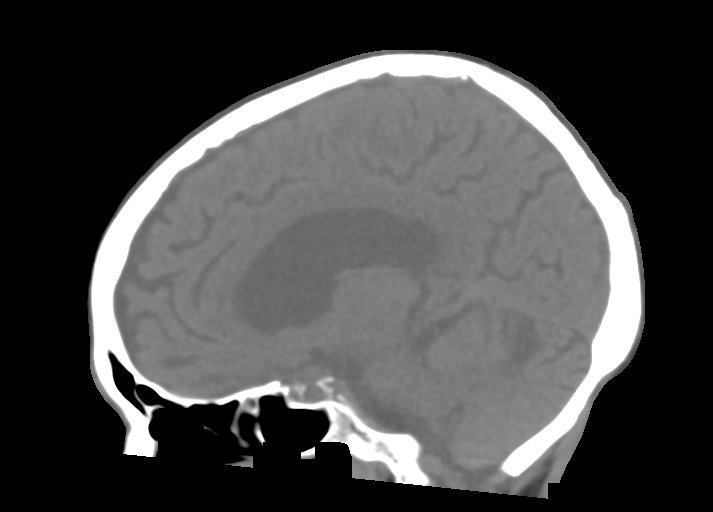
[im 31/62  bone]
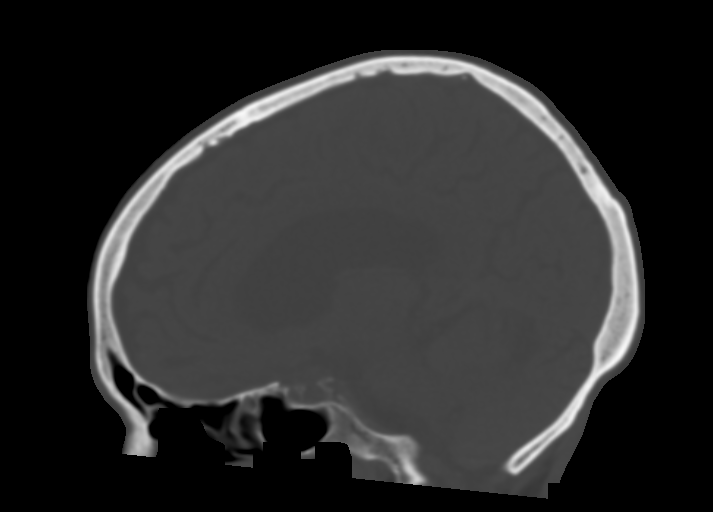
[im 36/62  bone]
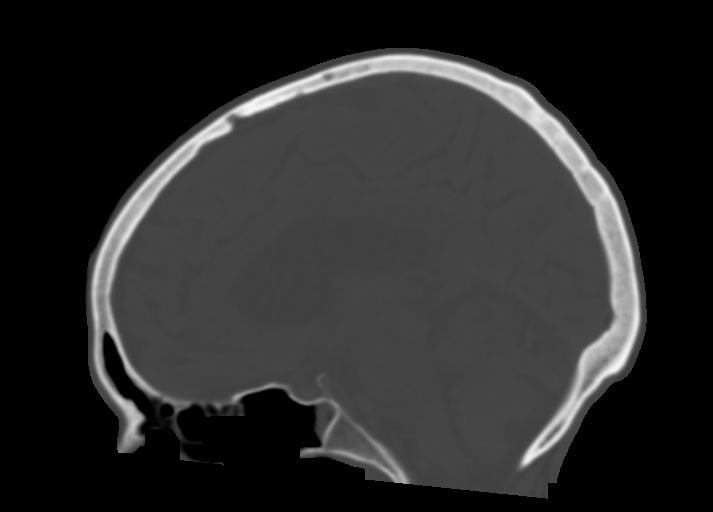
[im 41/62  bone]
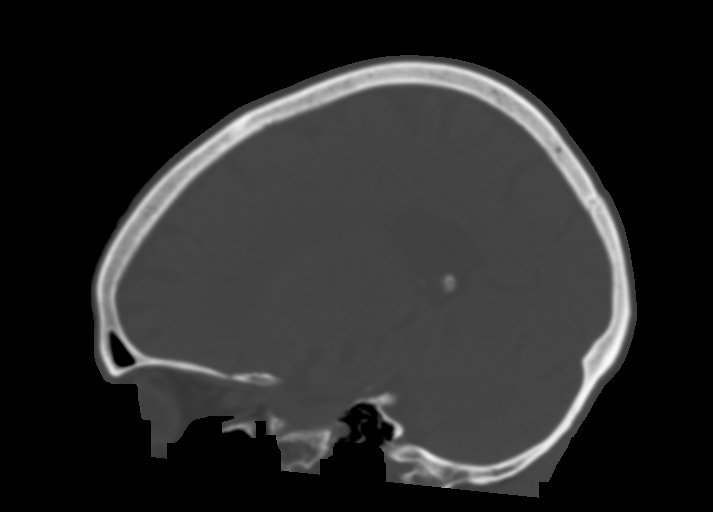

[13 of 33 positions shown; findings below may reference images not displayed]

FINDINGS: CT HEAD FINDINGS

Brain: Old right cerebellar infarction. Mild chronic ischemic white
matter disease is noted. Minimal diffuse cortical atrophy is noted.
Stable old right basal ganglia infarction is noted. No mass effect
or midline shift is noted. Ventricular size is within normal limits.
There is no evidence of mass lesion, hemorrhage or acute infarction.

Vascular: Atherosclerosis of internal carotid arteries is noted.

Skull: Bony calvarium appears intact.

Sinuses/Orbits: Mild left sphenoid sinusitis is noted.

Other: None.

CT CERVICAL SPINE FINDINGS

Alignment: Normal.

Skull base and vertebrae: No fracture is noted.

Soft tissues and spinal canal: No abnormality seen.

Disc levels: Moderate multilevel degenerative disc disease is noted.
Posterior facet joints appear unremarkable.

Upper chest: Visualized lung apices are unremarkable.

Other: None.
IMPRESSION: Mild chronic ischemic white matter disease. Minimal diffuse cortical
atrophy. Left sphenoid sinusitis. No acute intracranial abnormality
seen.

Moderate multilevel degenerative disc disease. No acute abnormality
seen in the cervical spine.

## 2017-09-12 NOTE — Telephone Encounter (Signed)
Handle..cdavis °
